# Patient Record
Sex: Male | Born: 1965 | Race: Black or African American | Hispanic: No | Marital: Married | Smoking: Never smoker
Health system: Southern US, Community
[De-identification: ages and names within clinical notes are randomized; demographics above are authoritative.]

## PROBLEM LIST (undated history)

## (undated) DIAGNOSIS — M109 Gout, unspecified: Secondary | ICD-10-CM

## (undated) DIAGNOSIS — G4730 Sleep apnea, unspecified: Secondary | ICD-10-CM

## (undated) DIAGNOSIS — H15101 Unspecified episcleritis, right eye: Secondary | ICD-10-CM

## (undated) DIAGNOSIS — R0902 Hypoxemia: Secondary | ICD-10-CM

## (undated) DIAGNOSIS — K219 Gastro-esophageal reflux disease without esophagitis: Secondary | ICD-10-CM

## (undated) DIAGNOSIS — T7840XA Allergy, unspecified, initial encounter: Secondary | ICD-10-CM

## (undated) DIAGNOSIS — I10 Essential (primary) hypertension: Secondary | ICD-10-CM

## (undated) DIAGNOSIS — D649 Anemia, unspecified: Secondary | ICD-10-CM

## (undated) DIAGNOSIS — M069 Rheumatoid arthritis, unspecified: Secondary | ICD-10-CM

## (undated) DIAGNOSIS — E785 Hyperlipidemia, unspecified: Secondary | ICD-10-CM

## (undated) HISTORY — DX: Allergy, unspecified, initial encounter: T78.40XA

## (undated) HISTORY — DX: Unspecified episcleritis, right eye: H15.101

## (undated) HISTORY — DX: Hypoxemia: R09.02

## (undated) HISTORY — DX: Sleep apnea, unspecified: G47.30

## (undated) HISTORY — DX: Anemia, unspecified: D64.9

## (undated) HISTORY — DX: Gout, unspecified: M10.9

## (undated) HISTORY — DX: Gastro-esophageal reflux disease without esophagitis: K21.9

## (undated) HISTORY — DX: Hyperlipidemia, unspecified: E78.5

---

## 2002-09-21 ENCOUNTER — Ambulatory Visit (HOSPITAL_COMMUNITY): Admission: RE | Admit: 2002-09-21 | Discharge: 2002-09-21 | Payer: Self-pay | Admitting: Specialist

## 2003-07-31 HISTORY — PX: KNEE SURGERY: SHX244

## 2004-06-02 ENCOUNTER — Ambulatory Visit: Payer: Self-pay | Admitting: Family Medicine

## 2004-06-12 ENCOUNTER — Ambulatory Visit: Payer: Self-pay | Admitting: Family Medicine

## 2005-02-15 ENCOUNTER — Ambulatory Visit: Payer: Self-pay | Admitting: Family Medicine

## 2006-01-07 ENCOUNTER — Ambulatory Visit: Payer: Self-pay | Admitting: Family Medicine

## 2006-01-10 ENCOUNTER — Ambulatory Visit: Payer: Self-pay | Admitting: Family Medicine

## 2006-02-04 ENCOUNTER — Ambulatory Visit: Payer: Self-pay | Admitting: Family Medicine

## 2006-03-04 ENCOUNTER — Ambulatory Visit: Payer: Self-pay | Admitting: Family Medicine

## 2006-07-30 DIAGNOSIS — E1165 Type 2 diabetes mellitus with hyperglycemia: Secondary | ICD-10-CM | POA: Insufficient documentation

## 2007-03-07 DIAGNOSIS — J309 Allergic rhinitis, unspecified: Secondary | ICD-10-CM | POA: Insufficient documentation

## 2007-03-07 DIAGNOSIS — F528 Other sexual dysfunction not due to a substance or known physiological condition: Secondary | ICD-10-CM | POA: Insufficient documentation

## 2007-03-07 DIAGNOSIS — I10 Essential (primary) hypertension: Secondary | ICD-10-CM | POA: Insufficient documentation

## 2007-03-10 ENCOUNTER — Ambulatory Visit: Payer: Self-pay | Admitting: Family Medicine

## 2007-03-10 LAB — CONVERTED CEMR LAB
ALT: 33 units/L (ref 0–53)
AST: 27 units/L (ref 0–37)
Albumin: 4 g/dL (ref 3.5–5.2)
Alkaline Phosphatase: 71 units/L (ref 39–117)
BUN: 10 mg/dL (ref 6–23)
Basophils Absolute: 0 10*3/uL (ref 0.0–0.1)
Basophils Relative: 0.7 % (ref 0.0–1.0)
Bilirubin, Direct: 0.1 mg/dL (ref 0.0–0.3)
CO2: 30 meq/L (ref 19–32)
Calcium: 9.3 mg/dL (ref 8.4–10.5)
Chloride: 99 meq/L (ref 96–112)
Cholesterol: 148 mg/dL (ref 0–200)
Creatinine, Ser: 1.2 mg/dL (ref 0.4–1.5)
Eosinophils Absolute: 0.1 10*3/uL (ref 0.0–0.6)
Eosinophils Relative: 1.6 % (ref 0.0–5.0)
GFR calc Af Amer: 85 mL/min
GFR calc non Af Amer: 70 mL/min
Glucose, Bld: 148 mg/dL — ABNORMAL HIGH (ref 70–99)
HCT: 42.2 % (ref 39.0–52.0)
HDL: 27.2 mg/dL — ABNORMAL LOW (ref >39.0)
Hemoglobin: 14.2 g/dL (ref 13.0–17.0)
Hgb A1c MFr Bld: 7.8 % — ABNORMAL HIGH (ref 4.6–6.0)
LDL Cholesterol: 95 mg/dL (ref 0–99)
Lymphocytes Relative: 41.7 % (ref 12.0–46.0)
MCHC: 33.7 g/dL (ref 30.0–36.0)
MCV: 80 fL (ref 78.0–100.0)
Monocytes Absolute: 0.6 10*3/uL (ref 0.2–0.7)
Monocytes Relative: 9.5 % (ref 3.0–11.0)
Neutro Abs: 3.2 10*3/uL (ref 1.4–7.7)
Neutrophils Relative %: 46.5 % (ref 43.0–77.0)
PSA: 0.33 ng/mL (ref 0.10–4.00)
Platelets: 220 10*3/uL (ref 150–400)
Potassium: 3.5 meq/L (ref 3.5–5.1)
RBC: 5.27 M/uL (ref 4.22–5.81)
RDW: 12.4 % (ref 11.5–14.6)
Sodium: 137 meq/L (ref 135–145)
TSH: 3.3 microintl units/mL (ref 0.35–5.50)
Total Bilirubin: 1 mg/dL (ref 0.3–1.2)
Total CHOL/HDL Ratio: 5.4
Total Protein: 7.4 g/dL (ref 6.0–8.3)
Triglycerides: 127 mg/dL (ref 0–149)
VLDL: 25 mg/dL (ref 0–40)
WBC: 6.7 10*3/uL (ref 4.5–10.5)

## 2007-03-13 ENCOUNTER — Ambulatory Visit: Payer: Self-pay | Admitting: Family Medicine

## 2007-03-24 ENCOUNTER — Ambulatory Visit: Payer: Self-pay | Admitting: Family Medicine

## 2007-05-26 ENCOUNTER — Ambulatory Visit: Payer: Self-pay | Admitting: Family Medicine

## 2007-05-26 LAB — CONVERTED CEMR LAB: Hgb A1c MFr Bld: 6.6 % — ABNORMAL HIGH (ref 4.6–6.0)

## 2008-04-15 ENCOUNTER — Ambulatory Visit: Payer: Self-pay | Admitting: Family Medicine

## 2008-04-15 LAB — CONVERTED CEMR LAB
ALT: 31 units/L (ref 0–53)
AST: 24 units/L (ref 0–37)
Albumin: 4.1 g/dL (ref 3.5–5.2)
Alkaline Phosphatase: 63 units/L (ref 39–117)
BUN: 13 mg/dL (ref 6–23)
Basophils Absolute: 0.1 10*3/uL (ref 0.0–0.1)
Basophils Relative: 0.9 % (ref 0.0–3.0)
Bilirubin, Direct: 0.1 mg/dL (ref 0.0–0.3)
CO2: 26 meq/L (ref 19–32)
Calcium: 9.4 mg/dL (ref 8.4–10.5)
Chloride: 101 meq/L (ref 96–112)
Cholesterol: 116 mg/dL (ref 0–200)
Creatinine, Ser: 1.2 mg/dL (ref 0.4–1.5)
Creatinine,U: 226.6 mg/dL
Eosinophils Absolute: 0.1 10*3/uL (ref 0.0–0.7)
Eosinophils Relative: 1 % (ref 0.0–5.0)
GFR calc Af Amer: 85 mL/min
GFR calc non Af Amer: 70 mL/min
Glucose, Bld: 121 mg/dL — ABNORMAL HIGH (ref 70–99)
HCT: 42.3 % (ref 39.0–52.0)
HDL: 25.2 mg/dL — ABNORMAL LOW (ref >39.0)
Hemoglobin: 14.3 g/dL (ref 13.0–17.0)
Hgb A1c MFr Bld: 6.8 % — ABNORMAL HIGH (ref 4.6–6.0)
LDL Cholesterol: 80 mg/dL (ref 0–99)
Lymphocytes Relative: 40.6 % (ref 12.0–46.0)
MCHC: 33.9 g/dL (ref 30.0–36.0)
MCV: 80.9 fL (ref 78.0–100.0)
Microalb Creat Ratio: 1.8 mg/g (ref 0.0–30.0)
Microalb, Ur: 0.4 mg/dL (ref 0.0–1.9)
Monocytes Absolute: 0.6 10*3/uL (ref 0.1–1.0)
Monocytes Relative: 8.7 % (ref 3.0–12.0)
Neutro Abs: 3 10*3/uL (ref 1.4–7.7)
Neutrophils Relative %: 48.8 % (ref 43.0–77.0)
PSA: 0.24 ng/mL (ref 0.10–4.00)
Platelets: 230 10*3/uL (ref 150–400)
Potassium: 3.9 meq/L (ref 3.5–5.1)
RBC: 5.22 M/uL (ref 4.22–5.81)
RDW: 12.4 % (ref 11.5–14.6)
Sodium: 136 meq/L (ref 135–145)
TSH: 3.02 microintl units/mL (ref 0.35–5.50)
Total Bilirubin: 1 mg/dL (ref 0.3–1.2)
Total CHOL/HDL Ratio: 4.6
Total Protein: 7.5 g/dL (ref 6.0–8.3)
Triglycerides: 52 mg/dL (ref 0–149)
VLDL: 10 mg/dL (ref 0–40)
WBC: 6.4 10*3/uL (ref 4.5–10.5)

## 2008-04-22 ENCOUNTER — Ambulatory Visit: Payer: Self-pay | Admitting: Family Medicine

## 2008-08-18 DIAGNOSIS — M25469 Effusion, unspecified knee: Secondary | ICD-10-CM | POA: Insufficient documentation

## 2008-08-23 ENCOUNTER — Ambulatory Visit: Payer: Self-pay | Admitting: Family Medicine

## 2008-10-26 ENCOUNTER — Ambulatory Visit: Payer: Self-pay | Admitting: Family Medicine

## 2008-10-26 LAB — CONVERTED CEMR LAB
BUN: 15 mg/dL (ref 6–23)
CO2: 30 meq/L (ref 19–32)
Calcium: 9.2 mg/dL (ref 8.4–10.5)
Chloride: 107 meq/L (ref 96–112)
Creatinine, Ser: 1.1 mg/dL (ref 0.4–1.5)
GFR calc non Af Amer: 93.21 mL/min (ref >60)
Glucose, Bld: 141 mg/dL — ABNORMAL HIGH (ref 70–99)
Hgb A1c MFr Bld: 7.2 % — ABNORMAL HIGH (ref 4.6–6.5)
Potassium: 4.3 meq/L (ref 3.5–5.1)
Sodium: 142 meq/L (ref 135–145)

## 2008-11-03 ENCOUNTER — Ambulatory Visit: Payer: Self-pay | Admitting: Family Medicine

## 2008-12-28 ENCOUNTER — Encounter: Payer: Self-pay | Admitting: Family Medicine

## 2009-01-24 ENCOUNTER — Ambulatory Visit: Payer: Self-pay | Admitting: Family Medicine

## 2009-01-24 ENCOUNTER — Telehealth: Payer: Self-pay | Admitting: Family Medicine

## 2009-01-24 LAB — CONVERTED CEMR LAB
BUN: 18 mg/dL (ref 6–23)
CO2: 28 meq/L (ref 19–32)
Calcium: 9.1 mg/dL (ref 8.4–10.5)
Chloride: 107 meq/L (ref 96–112)
Creatinine, Ser: 1.3 mg/dL (ref 0.4–1.5)
GFR calc non Af Amer: 76.78 mL/min (ref >60)
Glucose, Bld: 148 mg/dL — ABNORMAL HIGH (ref 70–99)
Hgb A1c MFr Bld: 7.2 % — ABNORMAL HIGH (ref 4.6–6.5)
Potassium: 4.6 meq/L (ref 3.5–5.1)
Sodium: 142 meq/L (ref 135–145)

## 2009-02-07 ENCOUNTER — Ambulatory Visit: Payer: Self-pay | Admitting: Family Medicine

## 2009-04-11 ENCOUNTER — Ambulatory Visit: Payer: Self-pay | Admitting: Family Medicine

## 2009-04-11 LAB — CONVERTED CEMR LAB
ALT: 34 units/L (ref 0–53)
AST: 23 units/L (ref 0–37)
Albumin: 4 g/dL (ref 3.5–5.2)
Alkaline Phosphatase: 66 units/L (ref 39–117)
BUN: 15 mg/dL (ref 6–23)
Basophils Absolute: 0 10*3/uL (ref 0.0–0.1)
Basophils Relative: 0.2 % (ref 0.0–3.0)
Bilirubin Urine: NEGATIVE
Bilirubin, Direct: 0 mg/dL (ref 0.0–0.3)
Blood in Urine, dipstick: NEGATIVE
CO2: 28 meq/L (ref 19–32)
Calcium: 9.4 mg/dL (ref 8.4–10.5)
Chloride: 106 meq/L (ref 96–112)
Cholesterol: 148 mg/dL (ref 0–200)
Creatinine, Ser: 1.2 mg/dL (ref 0.4–1.5)
Creatinine,U: 173.5 mg/dL
Eosinophils Absolute: 0.1 10*3/uL (ref 0.0–0.7)
Eosinophils Relative: 1.4 % (ref 0.0–5.0)
GFR calc non Af Amer: 84.13 mL/min (ref >60)
Glucose, Bld: 114 mg/dL — ABNORMAL HIGH (ref 70–99)
Glucose, Urine, Semiquant: NEGATIVE
HCT: 44.6 % (ref 39.0–52.0)
HDL: 31.6 mg/dL — ABNORMAL LOW (ref >39.00)
Hemoglobin: 14.7 g/dL (ref 13.0–17.0)
Hgb A1c MFr Bld: 6.8 % — ABNORMAL HIGH (ref 4.6–6.5)
Ketones, urine, test strip: NEGATIVE
LDL Cholesterol: 98 mg/dL (ref 0–99)
Lymphocytes Relative: 38.7 % (ref 12.0–46.0)
Lymphs Abs: 2.5 10*3/uL (ref 0.7–4.0)
MCHC: 32.9 g/dL (ref 30.0–36.0)
MCV: 82.6 fL (ref 78.0–100.0)
Microalb Creat Ratio: 1.7 mg/g (ref 0.0–30.0)
Microalb, Ur: 0.3 mg/dL (ref 0.0–1.9)
Monocytes Absolute: 0.6 10*3/uL (ref 0.1–1.0)
Monocytes Relative: 8.9 % (ref 3.0–12.0)
Neutro Abs: 3.2 10*3/uL (ref 1.4–7.7)
Neutrophils Relative %: 50.8 % (ref 43.0–77.0)
Nitrite: NEGATIVE
PSA: 0.26 ng/mL (ref 0.10–4.00)
Platelets: 207 10*3/uL (ref 150.0–400.0)
Potassium: 4.3 meq/L (ref 3.5–5.1)
Protein, U semiquant: NEGATIVE
RBC: 5.4 M/uL (ref 4.22–5.81)
RDW: 12.6 % (ref 11.5–14.6)
Sodium: 139 meq/L (ref 135–145)
Specific Gravity, Urine: 1.025
TSH: 2.06 microintl units/mL (ref 0.35–5.50)
Total Bilirubin: 0.9 mg/dL (ref 0.3–1.2)
Total CHOL/HDL Ratio: 5
Total Protein: 7.6 g/dL (ref 6.0–8.3)
Triglycerides: 90 mg/dL (ref 0.0–149.0)
Urobilinogen, UA: 0.2
VLDL: 18 mg/dL (ref 0.0–40.0)
WBC Urine, dipstick: NEGATIVE
WBC: 6.4 10*3/uL (ref 4.5–10.5)
pH: 5.5

## 2009-04-18 ENCOUNTER — Ambulatory Visit: Payer: Self-pay | Admitting: Family Medicine

## 2009-07-11 ENCOUNTER — Telehealth: Payer: Self-pay | Admitting: Family Medicine

## 2009-07-11 ENCOUNTER — Ambulatory Visit: Payer: Self-pay | Admitting: Family Medicine

## 2009-07-11 LAB — CONVERTED CEMR LAB
BUN: 13 mg/dL (ref 6–23)
Creatinine, Ser: 1.2 mg/dL (ref 0.4–1.5)
GFR calc non Af Amer: 84.04 mL/min (ref >60)
Hgb A1c MFr Bld: 6.9 % — ABNORMAL HIGH (ref 4.6–6.5)

## 2009-07-18 ENCOUNTER — Ambulatory Visit: Payer: Self-pay | Admitting: Family Medicine

## 2009-10-10 ENCOUNTER — Ambulatory Visit: Payer: Self-pay | Admitting: Family Medicine

## 2009-10-10 LAB — CONVERTED CEMR LAB
Calcium: 9.3 mg/dL (ref 8.4–10.5)
Creatinine, Ser: 1.1 mg/dL (ref 0.4–1.5)
GFR calc non Af Amer: 92.81 mL/min (ref >60)
Hgb A1c MFr Bld: 6.6 % — ABNORMAL HIGH (ref 4.6–6.5)
Sodium: 143 meq/L (ref 135–145)

## 2009-10-24 ENCOUNTER — Ambulatory Visit: Payer: Self-pay | Admitting: Family Medicine

## 2009-11-28 ENCOUNTER — Ambulatory Visit: Payer: Self-pay | Admitting: Pulmonary Disease

## 2009-11-28 DIAGNOSIS — G4710 Hypersomnia, unspecified: Secondary | ICD-10-CM | POA: Insufficient documentation

## 2009-11-28 DIAGNOSIS — G4730 Sleep apnea, unspecified: Secondary | ICD-10-CM

## 2009-11-29 ENCOUNTER — Encounter: Payer: Self-pay | Admitting: Pulmonary Disease

## 2009-11-30 ENCOUNTER — Ambulatory Visit: Payer: Self-pay | Admitting: Pulmonary Disease

## 2009-12-07 DIAGNOSIS — G4733 Obstructive sleep apnea (adult) (pediatric): Secondary | ICD-10-CM | POA: Insufficient documentation

## 2009-12-12 ENCOUNTER — Encounter: Payer: Self-pay | Admitting: Pulmonary Disease

## 2009-12-12 ENCOUNTER — Telehealth: Payer: Self-pay | Admitting: Pulmonary Disease

## 2009-12-19 ENCOUNTER — Encounter: Admission: RE | Admit: 2009-12-19 | Discharge: 2009-12-19 | Payer: Self-pay | Admitting: Family Medicine

## 2009-12-20 ENCOUNTER — Ambulatory Visit: Payer: Self-pay | Admitting: Pulmonary Disease

## 2010-01-13 ENCOUNTER — Emergency Department (HOSPITAL_COMMUNITY): Admission: EM | Admit: 2010-01-13 | Discharge: 2010-01-13 | Payer: Self-pay | Admitting: Emergency Medicine

## 2010-01-16 ENCOUNTER — Ambulatory Visit: Payer: Self-pay | Admitting: Family Medicine

## 2010-01-16 LAB — CONVERTED CEMR LAB
Calcium: 9 mg/dL (ref 8.4–10.5)
GFR calc non Af Amer: 75.77 mL/min (ref >60)
Hgb A1c MFr Bld: 6.7 % — ABNORMAL HIGH (ref 4.6–6.5)
Potassium: 4.4 meq/L (ref 3.5–5.1)
Sodium: 141 meq/L (ref 135–145)

## 2010-01-20 ENCOUNTER — Ambulatory Visit: Payer: Self-pay | Admitting: Family Medicine

## 2010-01-26 ENCOUNTER — Encounter: Payer: Self-pay | Admitting: Family Medicine

## 2010-02-24 ENCOUNTER — Ambulatory Visit: Payer: Self-pay | Admitting: Internal Medicine

## 2010-02-24 DIAGNOSIS — M25579 Pain in unspecified ankle and joints of unspecified foot: Secondary | ICD-10-CM | POA: Insufficient documentation

## 2010-04-10 ENCOUNTER — Ambulatory Visit: Payer: Self-pay | Admitting: Family Medicine

## 2010-04-10 LAB — CONVERTED CEMR LAB
ALT: 27 units/L (ref 0–53)
BUN: 17 mg/dL (ref 6–23)
Basophils Relative: 0.4 % (ref 0.0–3.0)
Bilirubin Urine: NEGATIVE
CO2: 26 meq/L (ref 19–32)
Chloride: 103 meq/L (ref 96–112)
Cholesterol: 128 mg/dL (ref 0–200)
Eosinophils Relative: 2.9 % (ref 0.0–5.0)
Glucose, Urine, Semiquant: NEGATIVE
HCT: 41.7 % (ref 39.0–52.0)
LDL Cholesterol: 88 mg/dL (ref 0–99)
Lymphs Abs: 2.4 10*3/uL (ref 0.7–4.0)
MCV: 80 fL (ref 78.0–100.0)
Monocytes Absolute: 0.7 10*3/uL (ref 0.1–1.0)
PSA: 0.27 ng/mL (ref 0.10–4.00)
Platelets: 257 10*3/uL (ref 150.0–400.0)
Potassium: 4.5 meq/L (ref 3.5–5.1)
Specific Gravity, Urine: 1.025
TSH: 2.64 microintl units/mL (ref 0.35–5.50)
Total Protein: 7.1 g/dL (ref 6.0–8.3)
Urobilinogen, UA: 0.2
WBC: 6.4 10*3/uL (ref 4.5–10.5)
pH: 5.5

## 2010-04-17 ENCOUNTER — Ambulatory Visit: Payer: Self-pay | Admitting: Family Medicine

## 2010-07-14 ENCOUNTER — Telehealth: Payer: Self-pay | Admitting: Family Medicine

## 2010-08-17 ENCOUNTER — Ambulatory Visit
Admission: RE | Admit: 2010-08-17 | Discharge: 2010-08-17 | Payer: Self-pay | Source: Home / Self Care | Attending: Family Medicine | Admitting: Family Medicine

## 2010-08-17 ENCOUNTER — Other Ambulatory Visit: Payer: Self-pay | Admitting: Family Medicine

## 2010-08-17 DIAGNOSIS — M109 Gout, unspecified: Secondary | ICD-10-CM | POA: Insufficient documentation

## 2010-08-17 LAB — CBC WITH DIFFERENTIAL/PLATELET
Basophils Absolute: 0 10*3/uL (ref 0.0–0.1)
Basophils Relative: 0.3 % (ref 0.0–3.0)
Eosinophils Absolute: 0.2 10*3/uL (ref 0.0–0.7)
Eosinophils Relative: 2.4 % (ref 0.0–5.0)
HCT: 41.6 % (ref 39.0–52.0)
Hemoglobin: 13.8 g/dL (ref 13.0–17.0)
Lymphocytes Relative: 36.3 % (ref 12.0–46.0)
Lymphs Abs: 2.5 10*3/uL (ref 0.7–4.0)
MCHC: 33.2 g/dL (ref 30.0–36.0)
MCV: 81 fl (ref 78.0–100.0)
Monocytes Absolute: 0.7 10*3/uL (ref 0.1–1.0)
Monocytes Relative: 10.3 % (ref 3.0–12.0)
Neutro Abs: 3.5 10*3/uL (ref 1.4–7.7)
Neutrophils Relative %: 50.7 % (ref 43.0–77.0)
Platelets: 249 10*3/uL (ref 150.0–400.0)
RBC: 5.14 Mil/uL (ref 4.22–5.81)
RDW: 13.9 % (ref 11.5–14.6)
WBC: 6.9 10*3/uL (ref 4.5–10.5)

## 2010-08-17 LAB — URIC ACID: Uric Acid, Serum: 8.4 mg/dL — ABNORMAL HIGH (ref 4.0–7.8)

## 2010-08-18 ENCOUNTER — Telehealth: Payer: Self-pay | Admitting: Family Medicine

## 2010-08-31 NOTE — Assessment & Plan Note (Signed)
Summary: cpx/cjr   Vital Signs:  Patient profile:   45 year old male Height:      71 inches Weight:      288 pounds Temp:     98.2 degrees F oral BP sitting:   118 / 80  (left arm) Cuff size:   large  Vitals Entered By: Kern Reap CMA Duncan Dull) (April 17, 2010 9:45 AM)  CC: cpx Is Patient Diabetic? Yes Did you bring your meter with you today? No Pain Assessment Patient in pain? no        Primary Care Tram Wrenn:  Roderick Pee MD  CC:  cpx.  History of Present Illness: Rowe is a 45 year old male, who comes in today for annual physical evaluation because of underlying hypertension and diabetes in her right dysfunction.  His hypertension is treated with Zestoretic 20 -- 25 daily.  BP 118/80.  His diabetes is treated with Glucophage 1000 mg b.i.d., Glucotrol, 5 mg daily.  Fasting blood sugar are in the 110 to 127 range.  A1c7.1.  Twice a week he develops symptoms of hypoglycemia.  When he checks his blood sugar is around 70.  A lead something that he feels better.  He also uses Flonase nasal spray for allergic rhinitis and Levitra 20 mg p.r.n. for ED.  He also takes an 81-mg baby aspirin.  He had normal eye exam by the ophthalmologist 4 months ago.  He gets routine dental care, tetanus, 2002, seasonal flu shot today.  Allergies: 1)  ! Sulfa 2)  ! Celebrex  Past History:  Past medical, surgical, family and social histories (including risk factors) reviewed, and no changes noted (except as noted below).  Past Medical History: Reviewed history from 03/07/2007 and no changes required. Allergic rhinitis Diabetes mellitus, type II Hypertension ED  Past Surgical History: Reviewed history from 11/28/2009 and no changes required. L knee arthroscopy 2005  Family History: Reviewed history from 11/28/2009 and no changes required. none per pt report.   Social History: Reviewed history from 11/28/2009 and no changes required. 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 no    Any prior history of allergic reactions to egg and/or gelatin? no    Do you have a sensitivity to the preservative Thimersol? no    Do you have a past history of Guillan-Barre Syndrome? no    Do you currently have an acute febrile illness? no    Have you ever had a severe reaction to latex? no    Vaccine information given and explained to patient? yes    Are you currently pregnant? no    Lot Number:[id number]   Exp Date:01/26/2010   Site Given  Right  Deltoid IM   Physical Exam  General:  Well-developed,well-nourished,in no acute distress; alert,appropriate and cooperative throughout examination Head:  Normocephalic and atraumatic without obvious abnormalities. No apparent alopecia or balding. Eyes:  No corneal or conjunctival inflammation noted. EOMI. Perrla. Funduscopic exam benign, without hemorrhages, exudates or papilledema. Vision grossly normal. Ears:  External ear exam shows no significant lesions or deformities.  Otoscopic examination reveals clear canals, tympanic membranes are intact bilaterally without bulging, retraction, inflammation or discharge. Hearing is grossly normal bilaterally. Nose:  External nasal examination shows no deformity or  inflammation. Nasal mucosa are pink and moist without lesions or exudates. Mouth:  Oral mucosa and oropharynx without lesions or exudates.  Teeth in good repair. Neck:  No deformities, masses, or tenderness noted. Chest Wall:  No deformities, masses, tenderness or gynecomastia noted. Breasts:  No masses or gynecomastia noted Lungs:  Normal respiratory effort, chest expands symmetrically. Lungs are clear to auscultation, no crackles or wheezes. Heart:  Normal rate and regular rhythm. S1 and S2 normal  without gallop, murmur, click, rub or other extra sounds. Abdomen:  Bowel sounds positive,abdomen soft and non-tender without masses, organomegaly or hernias noted. Rectal:  No external abnormalities noted. Normal sphincter tone. No rectal masses or tenderness. Genitalia:  Testes bilaterally descended without nodularity, tenderness or masses. No scrotal masses or lesions. No penis lesions or urethral discharge. Prostate:  Prostate gland firm and smooth, no enlargement, nodularity, tenderness, mass, asymmetry or induration. Msk:  No deformity or scoliosis noted of thoracic or lumbar spine.   Pulses:  R and L carotid,radial,femoral,dorsalis pedis and posterior tibial pulses are full and equal bilaterally Extremities:  No clubbing, cyanosis, edema, or deformity noted with normal full range of motion of all joints.   Neurologic:  No cranial nerve deficits noted. Station and gait are normal. Plantar reflexes are down-going bilaterally. DTRs are symmetrical throughout. Sensory, motor and coordinative functions appear intact. Skin:  Intact without suspicious lesions or rashes Cervical Nodes:  No lymphadenopathy noted Axillary Nodes:  No palpable lymphadenopathy Inguinal Nodes:  No significant adenopathy Psych:  Cognition and judgment appear intact. Alert and cooperative with normal attention span and concentration. No apparent delusions, illusions, hallucinations  Diabetes Management Exam:    Foot Exam (with socks and/or shoes not present):       Sensory-Pinprick/Light touch:          Left medial foot (L-4): normal          Left dorsal foot (L-5): normal          Left lateral foot (S-1): normal          Right medial foot (L-4): normal          Right dorsal foot (L-5): normal          Right lateral foot (S-1): normal       Sensory-Monofilament:          Left foot: normal          Right foot: normal       Inspection:          Left foot: normal          Right foot: normal       Nails:           Left foot: normal          Right foot: normal    Eye Exam:       Eye Exam done elsewhere          Date: 01/17/2010          Results: normal          Done by: opth   Impression & Recommendations:  Problem # 1:  ERECTILE DYSFUNCTION (ICD-302.72) Assessment Improved  His updated medication list for this problem includes:    Levitra 20 Mg Tabs (Vardenafil hcl) .Marland Kitchen... Take one tab one hour before intercourse  Orders: Prescription Created Electronically [id number])  Problem # 2:  HYPERTENSION (ICD-401.9) Assessment: Improved  His updated medication list for this problem includes:    Zestoretic 20-25 Mg Tabs (  Lisinopril-hydrochlorothiazide) .Marland Kitchen... Take 1 tablet by mouth every morning  Orders: Prescription Created Electronically [phone]) EKG w/ Interpretation (93000)  Problem # 3:  DIABETES MELLITUS, TYPE II (ICD-250.00) Assessment: Improved  His updated medication list for this problem includes:    Aspirin 81 Mg Tbec (Aspirin) ..... Once daily    Zestoretic 20-25 Mg Tabs (Lisinopril-hydrochlorothiazide) .Marland Kitchen... Take 1 tablet by mouth every morning    Glucophage 1000 Mg Tabs (Metformin hcl) .Marland Kitchen... Take 1 tablet by mouth two times a day    Glucotrol 5 Mg Tabs (Glipizide) .Marland Kitchen... Take 1 tablet by mouth every morning  Orders: Prescription Created Electronically [phone])  Problem # 4:  ALLERGIC RHINITIS (ICD-477.9) Assessment: Unchanged  His updated medication list for this problem includes:    Flonase 50 Mcg/act Susp (Fluticasone propionate) ..... One shot r&l nose qhs  Orders: Prescription Created Electronically [phone])  Problem # 5:  Preventive Health Care (ICD-V70.0) Assessment: Unchanged  Complete Medication List: 1)  Aspirin 81 Mg Tbec (Aspirin) .... Once daily 2)  Zestoretic 20-25 Mg Tabs (Lisinopril-hydrochlorothiazide) .... Take 1 tablet by mouth every morning 3)  Flonase 50 Mcg/act Susp (Fluticasone propionate) .... One shot r&l nose qhs 4)  Glucophage 1000 Mg Tabs  (Metformin hcl) .... Take 1 tablet by mouth two times a day 5)  Accu-chek Aviva Strp (Glucose blood) .... Use once daily for glucose control  dx 250.00 6)  Levitra 20 Mg Tabs (Vardenafil hcl) .... Take one tab one hour before intercourse 7)  Glucotrol 5 Mg Tabs (Glipizide) .... Take 1 tablet by mouth every morning  Other Orders: Admin 1st Vaccine (21308) Flu Vaccine 72yrs + (65784)  Patient Instructions: 1)  Please schedule a follow-up appointment in 6 months. 2)  It is important that you exercise regularly at least 20 minutes 5 times a week. If you develop chest pain, have severe difficulty breathing, or feel very tired , stop exercising immediately and seek medical attention. 3)  Take an Aspirin every day. 4)  Check your blood sugars regularly. If your readings are usually above : or below 70 you should contact our office. 5)  It is important that your Diabetic A1c level is checked every 3 months. 6)  See your eye doctor yearly to check for diabetic eye damage. 7)  Check your feet each night for sore areas, calluses or signs of infection. 8)  Check your Blood Pressure regularly. If it is above: you should make an appointment. 9)  BMP prior to visit, ICD-9: 10)  HbgA1C prior to visit, ICD-9: Prescriptions: GLUCOTROL 5 MG TABS (GLIPIZIDE) Take 1 tablet by mouth every morning  #100 x 3   Entered and Authorized by:   Roderick Pee MD   Signed by:   Roderick Pee MD on 04/17/2010   Method used:   Electron[facility] to        [facility] [phone]* (retail)       [address] [place], [place]  [address]       Ph: [phone] or [phone]       Fax: [phone]   RxID:   [id number] LEVITRA 20 MG TABS (VARDENAFIL HCL) take one tab one hour before intercourse  #6 x 11   Entered and Authorized by:   Roderick Pee MD   Signed by:   Roderick Pee MD on 04/17/2010   Method used:   [facility] to        [facility]  [address] [phone]* (retail)       [address] [place], [place]  [address]       Ph: [phone] or [phone]       Fax: [phone]   RxID:   [id number] ACCU-CHEK AVIVA  STRP (GLUCOSE BLOOD) use once daily for glucose control  dx 250.00  #100 x 3   Entered and Authorized by:   Roderick Pee MD   Signed by:   Roderick Pee MD on 04/17/2010   Method used:   Electronically to        [facility] [phone]* (retail)       [address] [place], [place]  [address]       Ph: [phone] or [phone]       Fax: [phone]   RxID:   [id number] GLUCOPHAGE 1000 MG TABS (METFORMIN HCL) Take 1 tablet by mouth two times a day  #200 x 3   Entered and Authorized by:   Roderick Pee MD   Signed by:   Roderick Pee MD on 04/17/2010   Method used:   Electronically to        [facility] [phone]* (retail)       [address] [place], [place]  [address]       Ph: [phone] or [phone]       Fax: [phone]   RxID:   [id number] FLONASE 50 MCG/ACT SUSP (FLUTICASONE PROPIONATE) one shot R&L nose qhs  #3 x 3   Entered and Authorized by:   Roderick Pee MD   Signed by:   Roderick Pee MD on 04/17/2010   Method used:   Electronically to        [facility] [phone]* (retail)       [address] [place], [place]  [address]       Ph: [phone] or [phone]       Fax: [phone]   RxID:   [id number] ZESTORETIC 20-25 MG  TABS (LISINOPRIL-HYDROCHLOROTHIAZIDE) Take 1 tablet by mouth every morning  #100 Tablet x 3   Entered and Authorized by:   Roderick Pee MD   Signed by:   Roderick Pee MD on 04/17/2010   Method used:   Electronically to        [facility] [phone]* (retail)       [address] [place], [place]  [address]       Ph: [phone] or [phone]       Fax: [phone]   RxID:   [id number]

## 2010-08-31 NOTE — Miscellaneous (Signed)
Summary: eye exam  Clinical Lists Changes  Observations: Added new observation of EYES COMMENT: 12/2010 (01/26/2010 9:28) Added new observation of EYE EXAM BY: dr Weston Anna (12/20/2009 9:28) Added new observation of DMEYEEXMRES: normal (12/20/2009 9:28) Added new observation of DIAB EYE EX: normal (12/20/2009 9:28)      Diabetes Management History:      He says that he is exercising.  Type of exercise includes: walk.  He is doing this 5 times per week.    Diabetes Management Exam:    Eye Exam:       Eye Exam done elsewhere          Date: 12/20/2009          Results: normal          Done by: dr Weston Anna

## 2010-08-31 NOTE — Assessment & Plan Note (Signed)
Summary: F/U ON LABWORK // RS   Vital Signs:  Patient profile:   45 year old male Weight:      286 pounds Temp:     98.2 degrees F oral BP sitting:   110 / 80  (left arm) Cuff size:   regular  Vitals Entered By: Kern Reap CMA Duncan Dull) (January 20, 2010 9:23 AM) CC: follow-up visit  Vision Screening:      Vision Comments: 11/2010   Primary Care Provider:  Roderick Pee MD  CC:  follow-up visit.  History of Present Illness: Clifford Kramer is a 45 year old male, diabetic, type II.  Comes in today for follow-up of diabetes.  His current treatment program in addition to diet and exercise is Glucophage 1000 mg b.i.d., Glucotrol 5 mg q.a.m.  His blood sugars at home average between 80 and 120.  Twice a week around 3 in the afternoon.  He typically will have a little hypoglycemic episode.  He checks his blood sugars is in the mid 60s.  He eats something right away, and he feels fine.  We discussed if that continues and is more persistent to let us know.  Would decrease his medication.  He did see the nutritionist.  Eye exam May 1 normal.  No retinopathy  Diabetes Management History:      He says that he is exercising.  Type of exercise includes: walk.  He is doing this 5 times per week.    Allergies: 1)  ! Sulfa 2)  ! Celebrex  Review of Systems      See HPI  Physical Exam  General:  Well-developed,well-nourished,in no acute distress; alert,appropriate and cooperative throughout examination  Diabetes Management Exam:    Foot Exam (with socks and/or shoes not present):       Sensory-Pinprick/Light touch:          Left medial foot (L-4): normal          Left dorsal foot (L-5): normal          Left lateral foot (S-1): normal          Right medial foot (L-4): normal          Right dorsal foot (L-5): normal          Right lateral foot (S-1): normal       Sensory-Monofilament:          Left foot: normal          Right foot: normal       Inspection:          Left foot: normal        Right foot: normal       Nails:          Left foot: normal          Right foot: normal    Eye Exam:       Eye Exam done elsewhere          Date: 11/27/2009          Results: normal          Done by: ellington   Complete Medication List: 1)  Aspirin 81 Mg Tbec (Aspirin) .... Once daily 2)  Zestoretic 20-25 Mg Tabs (Lisinopril-hydrochlorothiazide) .... Take 1 tablet by mouth every morning 3)  Flonase 50 Mcg/act Susp (Fluticasone propionate) .... One shot r&l nose qhs 4)  Glucophage 1000 Mg Tabs (Metformin hcl) .... Take 1 tablet by mouth two times a day 5)  Accu-chek  Aviva Strp (Glucose blood) .... Use once daily for glucose control  dx 250.00 6)  Levitra 20 Mg Tabs (Vardenafil hcl) .... Take one tab one hour before intercourse 7)  Glucotrol 5 Mg Tabs (Glipizide) .... Take 1 tablet by mouth every morning  Patient Instructions: 1)  BMP prior to visit, ICD-9:........250.00  third week in September 2)  Hepatic Panel prior to visit, ICD-9: 3)  Lipid Panel prior to visit, ICD-9: 4)  TSH prior to visit, ICD-9: 5)  CBC w/ Diff prior to visit, ICD-9: 6)  Urine-dip prior to visit, ICD-9: 7)  PSA prior to visit, ICD-9: 8)  HbgA1C prior to visit, ICD-9: 9)  Urine Microalbumin prior to visit, ICD-9:

## 2010-08-31 NOTE — Progress Notes (Signed)
Summary: rx allopurinol  Phone Note Outgoing Call   Summary of Call: rx for allopurinol Initial call taken by: Kern Reap CMA (AAMA),  August 18, 2010 4:55 PM    New/Updated Medications: ALLOPURINOL 300 MG TABS (ALLOPURINOL) take one tab by mouth once daily Prescriptions: ALLOPURINOL 300 MG TABS (ALLOPURINOL) take one tab by mouth once daily  #100 x 3   Entered by:   Kern Reap CMA (AAMA)   Authorized by:   Roderick Pee MD   Signed by:   Kern Reap CMA (AAMA) on 08/18/2010   Method used:   Electronically to        [facility] [phone]* (retail)       [address] [place], [place]  [address]       Ph: [phone] or [phone]       Fax: [phone]   RxID:   [id number]

## 2010-08-31 NOTE — Assessment & Plan Note (Signed)
Summary: 3 MO ROV/MM--- PT [facility] // RS   Vital Signs:  Patient profile:   45 year old male Height:      71.25 inches Weight:      296 pounds BMI:     41.14 Temp:     98.1 degrees F oral BP sitting:   128 / 78  (left arm) Cuff size:   large  Vitals Entered By: Kern Reap CMA Duncan Dull) (October 24, 2009 10:21 AM)  CC: follow-up visit Is Patient Diabetic? Yes Did you bring your meter with you today? No   CC:  follow-up visit.  History of Present Illness: Clifford Kramer is a 45 year old male, who comes in today for evaluation of 3 issues.  His blood pressure is 120/78 on Zestoretic 20 -- 25 daily.  Blood sugar averaging 130 to 100 home.  A1c down to 6.6%.  He is on Glucophage 1000 mg b.i.d. and Glucotrol 5 mg daily.  Since blood sugar and A1c are decreasing.  Will continue diet and exercise.  Also, will refer him to the [facility] for nutritional counseling.  His wife is concerned that he may have sleep apnea.  She states occasionally she notices he stops breathing at night.  He has no fatigue.  Otherwise, feels well.  Would like a consult to see if the sleep apnea study is indicated  Allergies: 1)  ! Sulfa 2)  ! Celebrex  Past History:  Past medical, surgical, family and social histories (including risk factors) reviewed for relevance to current acute and chronic problems.  Past Medical History: Reviewed history from 03/07/2007 and no changes required. Allergic rhinitis Diabetes mellitus, type II Hypertension ED  Family History: Reviewed history and no changes required.  Social History: Reviewed history from 04/22/2008 and no changes required. Occupation: Married Never Smoked Alcohol use-no Drug use-no Regular exercise-yes  Review of Systems      See HPI  Physical Exam  General:  Well-developed,well-nourished,in no acute distress; alert,appropriate and cooperative throughout examination   Impression & Recommendations:  Problem # 1:  HYPERTENSION (ICD-401.9) Assessment  Improved  His updated medication list for this problem includes:    Zestoretic 20-25 Mg Tabs (Lisinopril-hydrochlorothiazide) .Marland Kitchen... Take 1 tablet by mouth every morning  Problem # 2:  DIABETES MELLITUS, TYPE II (ICD-250.00) Assessment: Improved  His updated medication list for this problem includes:    Aspirin 81 Mg Tbec (Aspirin) ..... Once daily    Zestoretic 20-25 Mg Tabs (Lisinopril-hydrochlorothiazide) .Marland Kitchen... Take 1 tablet by mouth every morning    Glucophage 1000 Mg Tabs (Metformin hcl) .Marland Kitchen... Take 1 tablet by mouth two times a day    Glucotrol 5 Mg Tabs (Glipizide) .Marland Kitchen... Take 1 tablet by mouth every morning  Orders: Diabetic Clinic Referral (Diabetic)  Complete Medication List: 1)  Aspirin 81 Mg Tbec (Aspirin) .... Once daily 2)  Zestoretic 20-25 Mg Tabs (Lisinopril-hydrochlorothiazide) .... Take 1 tablet by mouth every morning 3)  Flonase 50 Mcg/act Susp (Fluticasone propionate) .... One shot r&l nose qhs 4)  Glucophage 1000 Mg Tabs (Metformin hcl) .... Take 1 tablet by mouth two times a day 5)  Accu-chek Aviva Strp (Glucose blood) .... Use once daily for glucose control  dx 250.00 6)  Levitra 20 Mg Tabs (Vardenafil hcl) .... Take one tab one hour before intercourse 7)  Glucotrol 5 Mg Tabs (Glipizide) .... Take 1 tablet by mouth every morning  Other Orders: Pulmonary Referral (Pulmonary)  Patient Instructions: 1)  continue your current treatment program.  We will get u  set  up for a consult at the [facility] for nutritional counseling. 2)  I will also get to set up for a consult with Dr.clance to see if a sleep apnea study is  indicated. 3)  Please schedule a follow-up appointment in 3 months......250.00 4)  BMP prior to visit, ICD-9: 5)  HbgA1C prior to visit, ICD-9:

## 2010-08-31 NOTE — Assessment & Plan Note (Signed)
Summary: painful ankle/dm   Vital Signs:  Patient profile:   45 year old male Weight:      294 pounds Temp:     98.3 degrees F oral BP sitting:   110 / 80  (left arm) Cuff size:   regular  Vitals Entered By: Kern Reap CMA Duncan Dull) (August 17, 2010 10:52 AM) CC: left ankle pain   Primary Care Provider:  Roderick Pee MD  CC:  left ankle pain.  History of Present Illness: Clifford Kramer is a 45 year old male, who comes in today for evaluation of recurrent left ankle pain over the past year.  He, states he's had 3 or 4 episodes of atraumatic pain in his left ankle.  His pain.  Last time was a 7 on a scale of one to 10.  He took 600 mg of Motrin a day for 4 days.  The pain has subsided.  Positive family history for gout  Allergies: 1)  ! Sulfa 2)  ! Celebrex  Past History:  Past medical, surgical, family and social histories (including risk factors) reviewed for relevance to current acute and chronic problems.  Past Medical History: Reviewed history from 03/07/2007 and no changes required. Allergic rhinitis Diabetes mellitus, type II Hypertension ED  Past Surgical History: Reviewed history from 11/28/2009 and no changes required. L knee arthroscopy 2005  Family History: Reviewed history from 11/28/2009 and no changes required. none per pt report.   Social History: Reviewed history from 11/28/2009 and no changes required. 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 ankle appears normal.  No swelling.  Pulses normal.  No point tenderness.  Discomfort with all range of motion Pulses:  R and L carotid,radial,femoral,dorsalis pedis and posterior tibial pulses are full and equal bilaterally Extremities:  No clubbing,  cyanosis, edema, or deformity noted with normal full range of motion of all joints.   Neurologic:  No cranial nerve deficits noted. Station and gait are normal. Plantar reflexes are down-going bilaterally. DTRs are symmetrical throughout. Sensory, motor and coordinative functions appear intact.   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 Once daily 2)  Zestoretic 20-25 Mg Tabs (Lisinopril-hydrochlorothiazide) .... Take 1 tablet by mouth every morning 3)  Flonase 50 Mcg/act Susp (Fluticasone propionate) .... One shot r&l nose qhs 4)  Glucophage 1000 Mg Tabs (Metformin hcl) .... Take 1 tablet by mouth two times a day 5)  Accu-chek Aviva Strp (Glucose blood) .... Use once daily for glucose control  dx 250.00 6)  Levitra 20 Mg Tabs (Vardenafil hcl) .... Take one tab one hour before intercourse 7)  Glucotrol 5 Mg Tabs (Glipizide) .... Take 1 tablet by mouth every morning  Patient Instructions: 1)  take 400 mg of Motrin 3 times a day with food x 1 week, then stop.  The Motrin. 2)  I will call you with the report of your blood work   Orders Added: 1)  Venipuncture [84696] 2)  TLB-CBC Platelet - w/Differential [85025-CBCD] 3)  TLB-Uric Acid, Blood [84550-URIC] 4)  Est. Patient Level III [29528] 5)  Specimen Handling [99000]

## 2010-08-31 NOTE — Assessment & Plan Note (Signed)
Summary: consult for ?osa   Copy to:  Kelle Darting Primary Provider/Referring Provider:  Roderick Pee MD  CC:  Sleep Consult.  History of Present Illness: The pt is a 45y/o male who I have been asked to see for possible osa.  He has been noted to have loud snoring, as well as pauses in his breathing during sleep.  He denies choking arousals.  He works third shift for [employer], and goes to bed during the week at 11am and awakens at 6pm.  He feels that he is rested most am's.  The pt denies significant sleepiness with periods of inactivity during his waking hours, but admits that he will often take a nap while on break at work.  He drives a truck for [employer] and denies significant sleepiness while doing so, but does on rare occasions have some sleepiness on the drive to [place].  He will stop and catch a nap and be fine.  On weekends, he admits that he will get sleepy watching tv or movies.  Of note, his weight is up 25 pounds over the last 2 years.  Medications Prior to Update: 1)  Aspirin 81 Mg  Tbec (Aspirin) .... Once Daily 2)  Zestoretic 20-25 Mg  Tabs (Lisinopril-Hydrochlorothiazide) .... Take 1 Tablet By Mouth Every Morning 3)  Flonase 50 Mcg/act Susp (Fluticasone Propionate) .... One Shot R&l Nose Qhs 4)  Glucophage 1000 Mg Tabs (Metformin Hcl) .... Take 1 Tablet By Mouth Two Times A Day 5)  Accu-Chek Aviva  Strp (Glucose Blood) .... Use Once Daily For Glucose Control  Dx 250.00 6)  Levitra 20 Mg Tabs (Vardenafil Hcl) .... Take One Tab One Hour Before Intercourse 7)  Glucotrol 5 Mg Tabs (Glipizide) .... Take 1 Tablet By Mouth Every Morning  Allergies (verified): 1)  ! Sulfa 2)  ! Celebrex  Past History:  Past Medical History: Reviewed history from 03/07/2007 and no changes required. Allergic rhinitis Diabetes mellitus, type II Hypertension ED  Past Surgical History: L knee arthroscopy 2005  Family History: Reviewed history and no changes required. none per pt report.     Social History: Reviewed history from 04/22/2008 and no changes required. Occupation:  Photographer for [employer] Married has children Never Smoked Alcohol use-no Drug use-no Regular exercise-yes  Review of Systems  The patient denies shortness of breath with activity, shortness of breath at rest, productive cough, non-productive cough, coughing up blood, chest pain, irregular heartbeats, acid heartburn, indigestion, loss of appetite, weight change, abdominal pain, difficulty swallowing, sore throat, tooth/dental problems, headaches, nasal congestion/difficulty breathing through nose, sneezing, itching, ear ache, anxiety, depression, hand/feet swelling, joint stiffness or pain, rash, change in color of mucus, and fever.    Vital Signs:  Patient profile:   45 year old male Height:      71 inches Weight:      299.50 pounds BMI:     41.92 O2 Sat:      98 % on Room air Temp:     98.1 degrees F oral Pulse rate:   92 / minute BP sitting:   104 / 70  (left arm) Cuff size:   large  Vitals Entered By: Arman Filter LPN (Nov 28, 1608 1:58 PM)  O2 Flow:  Room air CC: Sleep Consult Comments Medications reviewed with patient Arman Filter LPN  Nov 29, 9602 2:00 PM    Physical Exam  General:  obese male in nad Eyes:  PERRLA and EOMI.   Nose:  large turbinates, deviated septum to right Mouth:  large tongue, elongation of soft palate, normal uvula Neck:  no jvd, tmg, LN Lungs:  clear to auscultation Heart:  rrr, no mrg Abdomen:  soft and nontender, bs+ Extremities:  no edema, pulses intact distally no cyanosis Neurologic:  alert and oriented, moves all 4.   Impression & Recommendations:  Problem # 1:  SNORING (ICD-786.09) The pt has definite snoring that I suspect is symptomatic for him at times.  It is unclear whether he has significant sleep apnea.  He does have some sleepiness during his waking hours at times, but this may be partially due to his third shift work.  He is obese  and has some upper airway abnormalities.  I have had a long conversation with him about sleep apnea, including its impact to his QOL and CV health.  I think he needs a sleep study, and will set him up for a home study since his history is not completely convincing for osa.  I have also reminded him of his moral responsibility to not drive if he is sleepy.  Other Orders: Consultation Level IV (78295) Misc. Referral (Misc. Ref)  Patient Instructions: 1)  will set up for a study at home to check for sleep apnea.  Will call when results available. 2)  work on weight loss

## 2010-08-31 NOTE — Progress Notes (Signed)
Summary: returning call  Phone Note Call from Patient Call back at Home Phone [phone]   Caller: Patient Call For: [name] Summary of Call: Returning Megan's call. Initial call taken by: Darletta Moll,  Dec 12, 2009 10:13 AM  Follow-up for Phone Call        p[t just needed to set appt to discuss sleep results. pt set for 12-22-09 at 9:45. pt aware.Carron Curie CMA  Dec 12, 2009 10:21 AM

## 2010-08-31 NOTE — Assessment & Plan Note (Signed)
Summary: rov to review sleep study results.   Visit Type:  Follow-up Copy to:  Kelle Darting Primary Provider/Referring Provider:  Roderick Pee MD  CC:  Patient is here to discuss apnea link results. No complaints today.Marland Kitchen  History of Present Illness: the pt comes in today for f/u after his recent sleep study.  He was found to have an AHI of 10/hr, and desat transiently to as low as 81%.  I have reviewed the study in detail with him, and answered all of his questions.  Current Medications (verified): 1)  Aspirin 81 Mg  Tbec (Aspirin) .... Once Daily 2)  Zestoretic 20-25 Mg  Tabs (Lisinopril-Hydrochlorothiazide) .... Take 1 Tablet By Mouth Every Morning 3)  Flonase 50 Mcg/act Susp (Fluticasone Propionate) .... One Shot R&l Nose Qhs 4)  Glucophage 1000 Mg Tabs (Metformin Hcl) .... Take 1 Tablet By Mouth Two Times A Day 5)  Accu-Chek Aviva  Strp (Glucose Blood) .... Use Once Daily For Glucose Control  Dx 250.00 6)  Levitra 20 Mg Tabs (Vardenafil Hcl) .... Take One Tab One Hour Before Intercourse 7)  Glucotrol 5 Mg Tabs (Glipizide) .... Take 1 Tablet By Mouth Every Morning  Allergies (verified): 1)  ! Sulfa 2)  ! Celebrex  Review of Systems  The patient denies shortness of breath with activity, shortness of breath at rest, productive cough, non-productive cough, coughing up blood, chest pain, irregular heartbeats, acid heartburn, indigestion, loss of appetite, weight change, abdominal pain, difficulty swallowing, sore throat, tooth/dental problems, headaches, nasal congestion/difficulty breathing through nose, sneezing, itching, ear ache, anxiety, depression, hand/feet swelling, joint stiffness or pain, rash, change in color of mucus, and fever.    Vital Signs:  Patient profile:   45 year old male Height:      71 inches (180.34 cm) Weight:      293 pounds (133.18 kg) BMI:     41.01 O2 Sat:      95 % on Room air Temp:     98.0 degrees F (36.67 degrees C) oral Pulse rate:   90 /  minute BP sitting:   122 / 70  (left arm) Cuff size:   large  Vitals Entered By: Michel Bickers CMA (Dec 20, 2009 9:56 AM)  O2 Sat at Rest %:  95 O2 Flow:  Room air CC: Patient is here to discuss apnea link results. No complaints today. Comments Medications reviewed. Daytime phone verified. Michel Bickers CMA  Dec 20, 2009 9:57 AM   Physical Exam  General:  obese male in nad   Impression & Recommendations:  Problem # 1:  OBSTRUCTIVE SLEEP APNEA (ICD-327.23)  the pt has mild osa by his sleep study, and therefore this is not a health risk for him.  I have reviewed at length the various treatment options for this, including a trial of weight loss, surgery, dental appliance, and cpap.  I have reviewed the various benefits and inconveniences of each.  After a long discussion, the pt would like to take the next 6mos and work aggressively on weight loss.  Time spent with pt today discussing the above was .  Other Orders: Est. Patient Level IV (47829)  Patient Instructions: 1)  work on weight loss 2)  let me know if you would like to consider surgery, dental appliance, or cpap for more aggressive treatment.

## 2010-08-31 NOTE — Progress Notes (Signed)
Summary: cough  Phone Note Call from Patient   Caller: Patient Call For: Roderick Pee MD Summary of Call: Pt bought Sinex and Theraflu and wants to know if it is ok to take. [phone] Initial call taken by: Lynann Beaver CMA AAMA,  July 14, 2010 10:25 AM  Follow-up for Phone Call        I do not recommend that you take any of those over-the-counter medications.......... day having previous in them that will elevate your blood pressure and make your prostate swell up so you can not urinate!!!!!!!!!!!!!!     for a viral infectiondrink 30 ounces of water a day, vaporizer in your bedroom at night, and if you need cough syrup.......... Hydromet one half to 1 teaspoon at bedtime p.r.n., dispense 4 ounces one refill Follow-up by: Roderick Pee MD,  July 14, 2010 11:36 AM  Additional Follow-up for Phone Call Additional follow up Details #1::        left message on machine rx called in Additional Follow-up by: Kern Reap CMA Duncan Dull),  July 14, 2010 5:02 PM    New/Updated Medications: HYDROMET 5-1.5 MG/5ML SYRP (HYDROCODONE-HOMATROPINE) take half to one teaspoon at bedtime as needed cough

## 2010-08-31 NOTE — Letter (Signed)
Summary: [facility]  [address]   [place], [place] [address]   Phone: [phone]  Fax: [phone]    12/12/2009  Orhan Wearing [address] [place], [place]  [address]  Dear Mr. Vanlue,   We have attempted to contact you by phone several times but have been unsucessful in reaching you.  Please call our office at your earliest convenience so that we may schedule you a follow up appointment with Dr. Shelle Iron to discuss your test results.  Thank you.          Sincerely,   Marcelyn Bruins, M.D.

## 2010-08-31 NOTE — Procedures (Signed)
Summary: ApneaLink  ApneaLink   Imported By: Clifford Kramer 12/15/2009 13:04:50  _____________________________________________________________________  External Attachment:    Type:   Image     Comment:   External Document

## 2010-08-31 NOTE — Assessment & Plan Note (Signed)
Summary: apnea link shows mild osa with AHI 10/hr.   History of Present Illness: the pt's apnea link shows an AHI of 10/hr, with desat to 81%.  Flow evaluation period and sat evaluation period were 6h .  He also had 2766 snoring events.  Allergies: 1)  ! Sulfa 2)  ! Celebrex   Impression & Recommendations:  Problem # 1:  OBSTRUCTIVE SLEEP APNEA (ICD-327.23)  the pt had mild osa, and will need OV to discuss treatment options.  Orders: Sleep Std Airflow/Heartrate and O2 SAT unattended (14782)  Appended Document: apnea link shows mild osa with AHI 10/hr. megan, pt needs ov to discuss sleep study results.  Appended Document: apnea link shows mild osa with AHI 10/hr. [id number]  Appended Document: apnea link shows mild osa with AHI 10/hr. [id number]  Appended Document: apnea link shows mild osa with AHI 10/hr. [id number].  THis is my 3rd attempt to contact pt.  per protocol, will sign off on this message and send pt a letter to call our office to schedule ov with kc to discuss sleep study results.

## 2010-08-31 NOTE — Assessment & Plan Note (Signed)
Summary: acute foot pain/dm   Vital Signs:  Patient profile:   45 year old male Weight:      286 pounds Temp:     98.8 degrees F oral BP sitting:   120 / 80  (right arm) Cuff size:   large  Vitals Entered By: Duard Brady LPN (February 24, 2010 3:32 PM) CC: c/o (R) ankle and foot swelling and pain     fbs 135 Is Patient Diabetic? Yes Did you bring your meter with you today? No   Primary Care Provider:  Roderick Pee MD  CC:  c/o (R) ankle and foot swelling and pain     fbs 135.  History of Present Illness: 45 year old patient has a history of hypertension and type 2 diabetes.  He presents with a two-day history of  atraumatic left ankle pain and swelling.  He has been using ibuprofen and is improved today compared to yesterday.  His hypertension and diabetes have been well controlled.  He has no history or gallops or similar problem in the past.  Denies any leg discomfort or swelling.  No history of DVT  Allergies: 1)  ! Sulfa 2)  ! Celebrex  Review of Systems       The patient complains of peripheral edema.  The patient denies anorexia, fever, weight loss, weight gain, vision loss, decreased hearing, hoarseness, chest pain, syncope, dyspnea on exertion, prolonged cough, headaches, hemoptysis, abdominal pain, melena, hematochezia, severe indigestion/heartburn, hematuria, incontinence, genital sores, muscle weakness, suspicious skin lesions, transient blindness, difficulty walking, depression, unusual weight change, abnormal bleeding, enlarged lymph nodes, angioedema, breast masses, and testicular masses.    Physical Exam  General:  overweight-appearing.   Extremities:  patient has some slight tenderness and swelling involving the distal right ankle.  This spares the distal lower leg and calf.  There is slight tenderness with range of motion.  No calf tenderness   Impression & Recommendations:  Problem # 1:  ANKLE PAIN, LEFT (ICD-719.47) the problem appears be limited to  the left ankle.  Nothing to suggest a DVT.  He has improved after one day of ibuprofen and this will be continued.  Problem # 2:  HYPERTENSION (ICD-401.9)  His updated medication list for this problem includes:    Zestoretic 20-25 Mg Tabs (Lisinopril-hydrochlorothiazide) .Marland Kitchen... Take 1 tablet by mouth every morning  Complete Medication List: 1)  Aspirin 81 Mg Tbec (Aspirin) .... Once daily 2)  Zestoretic 20-25 Mg Tabs (Lisinopril-hydrochlorothiazide) .... Take 1 tablet by mouth every morning 3)  Flonase 50 Mcg/act Susp (Fluticasone propionate) .... One shot r&l nose qhs 4)  Glucophage 1000 Mg Tabs (Metformin hcl) .... Take 1 tablet by mouth two times a day 5)  Accu-chek Aviva Strp (Glucose blood) .... Use once daily for glucose control  dx 250.00 6)  Levitra 20 Mg Tabs (Vardenafil hcl) .... Take one tab one hour before intercourse 7)  Glucotrol 5 Mg Tabs (Glipizide) .... Take 1 tablet by mouth every morning  Patient Instructions: 1)  keep left leg elevated for the weekend 2)  Take 400-600mg  of Ibuprofen (Advil, Motrin) with food every 4-6 hours as needed for relief of pain or comfort of fever. 3)  call if pain and swelling  has not resolved by early next week

## 2010-09-16 ENCOUNTER — Other Ambulatory Visit: Payer: Self-pay | Admitting: Family Medicine

## 2010-09-28 ENCOUNTER — Ambulatory Visit: Payer: Self-pay | Admitting: Family Medicine

## 2010-10-02 ENCOUNTER — Other Ambulatory Visit (INDEPENDENT_AMBULATORY_CARE_PROVIDER_SITE_OTHER): Payer: BC Managed Care – PPO

## 2010-10-02 DIAGNOSIS — I10 Essential (primary) hypertension: Secondary | ICD-10-CM

## 2010-10-02 DIAGNOSIS — E119 Type 2 diabetes mellitus without complications: Secondary | ICD-10-CM

## 2010-10-02 LAB — BASIC METABOLIC PANEL
BUN: 15 mg/dL (ref 6–23)
CO2: 26 mEq/L (ref 19–32)
Chloride: 102 mEq/L (ref 96–112)
Glucose, Bld: 126 mg/dL — ABNORMAL HIGH (ref 70–99)
Potassium: 4.3 mEq/L (ref 3.5–5.1)

## 2010-10-09 ENCOUNTER — Encounter: Payer: Self-pay | Admitting: Family Medicine

## 2010-10-09 ENCOUNTER — Ambulatory Visit (INDEPENDENT_AMBULATORY_CARE_PROVIDER_SITE_OTHER): Payer: BC Managed Care – PPO | Admitting: Family Medicine

## 2010-10-09 DIAGNOSIS — E119 Type 2 diabetes mellitus without complications: Secondary | ICD-10-CM

## 2010-10-09 DIAGNOSIS — F528 Other sexual dysfunction not due to a substance or known physiological condition: Secondary | ICD-10-CM

## 2010-10-09 DIAGNOSIS — E291 Testicular hypofunction: Secondary | ICD-10-CM

## 2010-10-09 NOTE — Patient Instructions (Signed)
Continue current medications.  Begin a walking program 15 minutes daily.  Follow-up in 3 months.  Lab work one week at a time.  I will call you report of your testosterone level

## 2010-10-09 NOTE — Progress Notes (Signed)
  Subjective:    Patient ID: Clifford Kramer, male    DOB: 20-Feb-1966, 45 y.o.   MRN: [id number]  HPI Clifford Kramer is a delightful, 45 year old male, who comes in today for follow-up of diabetes, type II.  He is on metformin 500 mg daily and Glucotrol 5 mg daily fasting blood sugar at home at 110 to 120 and one C. Was 7.1% however, he is not walking daily.  We discussed for his ways to get his blood sugar down.  He likes to begin a walking program.  He also takes Levitra p.r.n. For erectile dysfunction.  However, he is complaining of fatigue, no energy, and decreased libido, and wants to know what his testosterone level is   Review of Systems Negative    Objective:   Physical Exam Well-developed well-nourished, male in no acute distress       Assessment & Plan:  Diabetes type II.... Not at goal........ Begin 15 minute walking program.  Follow-up A1c in 3 months.  Erectile dysfunction.............Clifford Kramer Low sex drive......... Question hypogonadism....... Check testosterone level

## 2010-10-16 NOTE — Progress Notes (Signed)
Addended by: Kern Reap on: 10/16/2010 06:45 PM   Modules accepted: Orders

## 2010-12-15 NOTE — Op Note (Signed)
NAMELAVERNE, Clifford Kramer                           ACCOUNT NO.:  [id number]   MEDICAL RECORD NO.:  [id number]                   PATIENT TYPE:  AMB   LOCATION:  DAY                                  FACILITY:  [facility]   PHYSICIAN:  Jene Every, M.D.                 DATE OF BIRTH:  June 13, 1966   DATE OF PROCEDURE:  09/21/2002  DATE OF DISCHARGE:                                 OPERATIVE REPORT   PREOPERATIVE DIAGNOSIS:  Atraumatic chondromalacia of the left knee.   POSTOPERATIVE DIAGNOSES:  1. Atraumatic chondromalacia of the left knee.  2. 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   SURGEON:  Jene Every, M.D.   BRIEF HISTORY AND INDICATION:  This is a 45 year old with refractory knee  pain secondary to an impact injury sustained at work with persistent  symptoms.  Operative intervention was indicated for debridement, diagnosis.  Risks and benefits discussed included bleeding, infection, damage to  neurovascular structures, no change in symptoms, worsening symptoms, etc.   TECHNIQUE:  The patient in supine position.  After induction of adequate  general anesthesia and 1 g of Kefzol, the left lower extremity was prepped  and draped in the usual sterile fashion.  A lateral parapatellar portal and  a superior medial parapatellar portal was fashioned with a #11 blade,  Ingress cannula atraumatically placed.  Irrigant was utilized to insufflate  the joint.  The superior medial Ingress cannula placed again.  Camera was  then inserted via lateral compartment.  Inspection of the suprapatellar  pouch revealed hypertrophic synovitis in the infrapatella region, grade 2  chondromalacia in the inferior pole of the patella, and the wound was  cartilaginous debrided with normal patellofemoral tracking.  The bed was  unremarkable.  Examination of the medial compartment revealed hypertrophic  synovitis invaginating into the medial compartment.  ACL showed some minor  fraying.   PCL was unremarkable.  Lateral compartment was with no evidence of  obvious lesion, lateral meniscus tear, or chondral defect.  Under direct  visualization, the medial and parapatellar portal was fashioned with a #11  blade after localization with 18 gauge needle, sparing the medial meniscus.  [name] was introduced and utilized to perform a chondroplasty of the  inferior pole of the patella, removing the hypertrophic synovium,  patellofemoral joint, and of the medial compartment.  Following this, a  probe was introduced and utilized to probe and evaluate the medial and  lateral meniscus.  There was no evidence of a tear and was stable to probe-  palpation.  No chondral defects in the tibial plateaus or the femoral  condyles.  The knee copiously lavaged and all compartments reexamined.  No  evidence of loose cartilaginous debris.  All instrumentation was removed.  The portals were closed with 4-0 nylon simple sutures.  The wound was  dressed sterilely.  Marcaine 25% with epinephrine was infiltrated into the  joint prior to that and sterile dressing secured with an Ace bandage.  The  patient was then awoken without difficulty and transported to recovery in  satisfactory condition.   The patient tolerated the procedure well with no complications.                                               Jene Every, M.D.    [name]  D:  09/21/2002  T:  09/21/2002  Job:  [id number]

## 2010-12-29 ENCOUNTER — Other Ambulatory Visit: Payer: Self-pay | Admitting: Family Medicine

## 2011-01-01 ENCOUNTER — Other Ambulatory Visit: Payer: BC Managed Care – PPO

## 2011-01-08 ENCOUNTER — Other Ambulatory Visit (INDEPENDENT_AMBULATORY_CARE_PROVIDER_SITE_OTHER): Payer: BC Managed Care – PPO

## 2011-01-08 ENCOUNTER — Ambulatory Visit: Payer: BC Managed Care – PPO | Admitting: Family Medicine

## 2011-01-08 DIAGNOSIS — E119 Type 2 diabetes mellitus without complications: Secondary | ICD-10-CM

## 2011-01-08 LAB — BASIC METABOLIC PANEL
CO2: 27 mEq/L (ref 19–32)
Chloride: 101 mEq/L (ref 96–112)
Glucose, Bld: 137 mg/dL — ABNORMAL HIGH (ref 70–99)
Potassium: 4.4 mEq/L (ref 3.5–5.1)
Sodium: 135 mEq/L (ref 135–145)

## 2011-01-08 LAB — HEMOGLOBIN A1C: Hgb A1c MFr Bld: 7.3 % — ABNORMAL HIGH (ref 4.6–6.5)

## 2011-01-22 ENCOUNTER — Encounter: Payer: Self-pay | Admitting: Family Medicine

## 2011-01-22 ENCOUNTER — Ambulatory Visit (INDEPENDENT_AMBULATORY_CARE_PROVIDER_SITE_OTHER): Payer: BC Managed Care – PPO | Admitting: Family Medicine

## 2011-01-22 VITALS — BP 120/80 | Temp 98.3°F | Wt 284.0 lb

## 2011-01-22 DIAGNOSIS — F528 Other sexual dysfunction not due to a substance or known physiological condition: Secondary | ICD-10-CM

## 2011-01-22 DIAGNOSIS — E119 Type 2 diabetes mellitus without complications: Secondary | ICD-10-CM

## 2011-01-22 MED ORDER — METFORMIN HCL 1000 MG PO TABS: 1000.0000 mg | ORAL_TABLET | Freq: Every day | ORAL | Status: DC

## 2011-01-22 MED ORDER — GLIPIZIDE 10 MG PO TABS: 10.0000 mg | ORAL_TABLET | Freq: Every day | ORAL | Status: DC

## 2011-01-22 NOTE — Progress Notes (Signed)
  Subjective:    Patient ID: Clifford Kramer, male    DOB: 1966-04-20, 45 y.o.   MRN: [id number]  Clifford Kramer is a 45 year old, married man nonsmoker, who comes in today for follow-up of diabetes, type II.  Currently, on glipizide 5 daily in 1000 mg of metformin daily fasting blood sugar is 137A1c 7.3%.  It was 7.1, and then 6.6, prior to that a year ago.    Review of Systems General and metabolic review of systems otherwise negative    Objective:   Physical Exam    Well-developed well-nourished, male in no acute distress    Assessment & Plan:  Diabetes type 2, not at goal increase glipizide to 10 mg daily follow-up A1c in 3 months

## 2011-01-22 NOTE — Patient Instructions (Signed)
Increase the glipizide to 10 mg daily.  Continue the metformin at 1000 mg daily.  Follow-up A1c and office visit in 3 months

## 2011-01-24 ENCOUNTER — Encounter: Payer: Self-pay | Admitting: Family Medicine

## 2011-03-25 ENCOUNTER — Other Ambulatory Visit: Payer: Self-pay | Admitting: Family Medicine

## 2011-04-16 ENCOUNTER — Other Ambulatory Visit: Payer: BC Managed Care – PPO

## 2011-04-20 ENCOUNTER — Other Ambulatory Visit: Payer: Self-pay | Admitting: Family Medicine

## 2011-04-20 DIAGNOSIS — N529 Male erectile dysfunction, unspecified: Secondary | ICD-10-CM

## 2011-04-20 DIAGNOSIS — I10 Essential (primary) hypertension: Secondary | ICD-10-CM

## 2011-04-23 ENCOUNTER — Ambulatory Visit: Payer: BC Managed Care – PPO

## 2011-04-23 ENCOUNTER — Encounter: Payer: BC Managed Care – PPO | Admitting: Family Medicine

## 2011-04-23 ENCOUNTER — Other Ambulatory Visit: Payer: BC Managed Care – PPO

## 2011-04-23 DIAGNOSIS — F528 Other sexual dysfunction not due to a substance or known physiological condition: Secondary | ICD-10-CM

## 2011-04-23 DIAGNOSIS — E119 Type 2 diabetes mellitus without complications: Secondary | ICD-10-CM

## 2011-04-23 LAB — LIPID PANEL
Cholesterol: 127 mg/dL (ref 0–200)
HDL: 32.6 mg/dL — ABNORMAL LOW (ref >39.00)
Total CHOL/HDL Ratio: 4
VLDL: 14.6 mg/dL (ref 0.0–40.0)

## 2011-04-23 LAB — HEPATIC FUNCTION PANEL
AST: 24 U/L (ref 0–37)
Albumin: 3.9 g/dL (ref 3.5–5.2)
Alkaline Phosphatase: 69 U/L (ref 39–117)
Bilirubin, Direct: 0.2 mg/dL (ref 0.0–0.3)
Total Protein: 7 g/dL (ref 6.0–8.3)

## 2011-04-23 LAB — CBC WITH DIFFERENTIAL/PLATELET
Basophils Absolute: 0 10*3/uL (ref 0.0–0.1)
Basophils Relative: 0.3 % (ref 0.0–3.0)
Hemoglobin: 14.7 g/dL (ref 13.0–17.0)
Lymphocytes Relative: 35.9 % (ref 12.0–46.0)
Monocytes Relative: 8.3 % (ref 3.0–12.0)
Neutro Abs: 3.4 10*3/uL (ref 1.4–7.7)
Neutrophils Relative %: 53.1 % (ref 43.0–77.0)
RBC: 5.49 Mil/uL (ref 4.22–5.81)

## 2011-04-23 LAB — BASIC METABOLIC PANEL
CO2: 29 mEq/L (ref 19–32)
Calcium: 9.1 mg/dL (ref 8.4–10.5)
Creatinine, Ser: 1.3 mg/dL (ref 0.4–1.5)
GFR: 78.82 mL/min (ref >60.00)
Potassium: 4.8 mEq/L (ref 3.5–5.1)
Sodium: 139 mEq/L (ref 135–145)

## 2011-04-23 LAB — URINALYSIS
Bilirubin Urine: NEGATIVE
Ketones, ur: NEGATIVE
Leukocytes, UA: NEGATIVE
Specific Gravity, Urine: 1.025 (ref 1.000–1.030)
Total Protein, Urine: NEGATIVE
Urine Glucose: NEGATIVE
pH: 6 (ref 5.0–8.0)

## 2011-04-23 LAB — MICROALBUMIN / CREATININE URINE RATIO
Creatinine,U: 240.2 mg/dL
Microalb Creat Ratio: 0.2 mg/g (ref 0.0–30.0)

## 2011-04-23 LAB — TSH: TSH: 2.22 u[IU]/mL (ref 0.35–5.50)

## 2011-04-23 LAB — PSA: PSA: 0.25 ng/mL (ref 0.10–4.00)

## 2011-04-23 LAB — HEMOGLOBIN A1C: Hgb A1c MFr Bld: 7.1 % — ABNORMAL HIGH (ref 4.6–6.5)

## 2011-04-28 ENCOUNTER — Other Ambulatory Visit: Payer: Self-pay | Admitting: Family Medicine

## 2011-04-30 ENCOUNTER — Encounter: Payer: Self-pay | Admitting: Family Medicine

## 2011-04-30 ENCOUNTER — Ambulatory Visit (INDEPENDENT_AMBULATORY_CARE_PROVIDER_SITE_OTHER): Payer: BC Managed Care – PPO | Admitting: Family Medicine

## 2011-04-30 VITALS — BP 110/80 | Temp 98.1°F | Ht 72.0 in | Wt 288.0 lb

## 2011-04-30 DIAGNOSIS — E119 Type 2 diabetes mellitus without complications: Secondary | ICD-10-CM

## 2011-04-30 DIAGNOSIS — Z23 Encounter for immunization: Secondary | ICD-10-CM

## 2011-04-30 DIAGNOSIS — J309 Allergic rhinitis, unspecified: Secondary | ICD-10-CM

## 2011-04-30 DIAGNOSIS — G4733 Obstructive sleep apnea (adult) (pediatric): Secondary | ICD-10-CM

## 2011-04-30 DIAGNOSIS — M109 Gout, unspecified: Secondary | ICD-10-CM

## 2011-04-30 DIAGNOSIS — I10 Essential (primary) hypertension: Secondary | ICD-10-CM

## 2011-04-30 DIAGNOSIS — Z0000 Encounter for general adult medical examination without abnormal findings: Secondary | ICD-10-CM

## 2011-04-30 DIAGNOSIS — F528 Other sexual dysfunction not due to a substance or known physiological condition: Secondary | ICD-10-CM

## 2011-04-30 MED ORDER — FLUTICASONE PROPIONATE 50 MCG/ACT NA SUSP: 2.0000 | Freq: Every day | NASAL | Status: DC

## 2011-04-30 MED ORDER — LISINOPRIL-HYDROCHLOROTHIAZIDE 20-25 MG PO TABS: 1.0000 | ORAL_TABLET | Freq: Every day | ORAL | Status: DC

## 2011-04-30 MED ORDER — GLIPIZIDE 10 MG PO TABS: ORAL_TABLET | ORAL | Status: DC

## 2011-04-30 MED ORDER — METFORMIN HCL 1000 MG PO TABS: 1000.0000 mg | ORAL_TABLET | Freq: Two times a day (BID) | ORAL | Status: DC

## 2011-04-30 MED ORDER — VARDENAFIL HCL 20 MG PO TABS: 20.0000 mg | ORAL_TABLET | Freq: Every day | ORAL | Status: DC | PRN

## 2011-04-30 MED ORDER — ALLOPURINOL 300 MG PO TABS: 300.0000 mg | ORAL_TABLET | Freq: Every day | ORAL | Status: DC

## 2011-04-30 NOTE — Patient Instructions (Signed)
Continue your current medications except take 5 mg of glipizide prior to evening meal.  Follow-up in 3 months fasting labs one week prior

## 2011-04-30 NOTE — Progress Notes (Signed)
  Subjective:    Patient ID: Clifford Kramer, male    DOB: 1965-09-27, 45 y.o.   MRN: [id number]  HPI Clifford Kramer is a 45 year old, married male, nonsmoker, who comes in today for annual evaluation of diabetes, hypertension, low testosterone, gout, erectile dysfunction, and allergic rhinitis.  He takes allopurinol 300 mg daily asymptomatic.  He uses a steroid nasal spray for his allergic rhinitis.  He takes glipizide 10 mg in the morning and metformin 1000 mg b.i.d. For his diabetes, type 2, blood sugar 138, fasting A1c7 .1%.  We will increase his oral medications.  Eventually he will need insulin.  He exercises on a treadmill one hour per day.  He sees Dr. Everardo All once yearly for follow-up of erectile dysfunction.  They have him on androgel/Levitra.  He takes Zestoretic 20 -- 25 daily for hypertension.  BP 110/80.  He gets annual eye exams, regular dental care.  The exit nations updated today.  Will be the flu and a tetanus booster   Review of Systems  Constitutional: Negative.   HENT: Negative.   Eyes: Negative.   Respiratory: Negative.   Cardiovascular: Negative.   Gastrointestinal: Negative.   Genitourinary: Negative.   Musculoskeletal: Negative.   Skin: Negative.   Neurological: Negative.   Hematological: Negative.   Psychiatric/Behavioral: Negative.        Objective:   Physical Exam  Constitutional: He is oriented to person, place, and time. He appears well-developed and well-nourished.  HENT:  Head: Normocephalic and atraumatic.  Right Ear: External ear normal.  Left Ear: External ear normal.  Nose: Nose normal.  Mouth/Throat: Oropharynx is clear and moist.  Eyes: Conjunctivae and EOM are normal. Pupils are equal, round, and reactive to light.  Neck: Normal range of motion. Neck supple. No JVD present. No tracheal deviation present. No thyromegaly present.  Cardiovascular: Normal rate, regular rhythm, normal heart sounds and intact distal pulses.  Exam reveals no gallop  and no friction rub.   No murmur heard. Pulmonary/Chest: Effort normal and breath sounds normal. No stridor. No respiratory distress. He has no wheezes. He has no rales. He exhibits no tenderness.  Abdominal: Soft. Bowel sounds are normal. He exhibits no distension and no mass. There is no tenderness. There is no rebound and no guarding.  Genitourinary: Rectum normal, prostate normal and penis normal. Guaiac negative stool. No penile tenderness.  Musculoskeletal: Normal range of motion. He exhibits no edema and no tenderness.  Lymphadenopathy:    He has no cervical adenopathy.  Neurological: He is alert and oriented to person, place, and time. He has normal reflexes. No cranial nerve deficit. He exhibits normal muscle tone.  Skin: Skin is warm and dry. No rash noted. No erythema. No pallor.  Psychiatric: He has a normal mood and affect. His behavior is normal. Judgment and thought content normal.          Assessment & Plan:  Healthy male.  Diabetes type II not a goal .........Marland Kitchen And 5 mg of glipizide, prior to 8 p.m. Meal.  Continue diet and exercise.  Follow-up A1c in 3 months.  Allergic rhinitis.  Continue steroid nasal spray.  Hypertension.  Continue Zestoretic.  History of testosterone deficiency.  Continue AndroGel and Levitra.  History of gout........ Continue allopurinol.  Follow-up in 3 months as outlined seasonal flu shot tetanus booster and cardiogram today.

## 2011-06-25 ENCOUNTER — Other Ambulatory Visit: Payer: Self-pay | Admitting: Family Medicine

## 2011-08-06 ENCOUNTER — Other Ambulatory Visit (INDEPENDENT_AMBULATORY_CARE_PROVIDER_SITE_OTHER): Payer: BC Managed Care – PPO

## 2011-08-06 DIAGNOSIS — I10 Essential (primary) hypertension: Secondary | ICD-10-CM

## 2011-08-06 DIAGNOSIS — N529 Male erectile dysfunction, unspecified: Secondary | ICD-10-CM

## 2011-08-06 DIAGNOSIS — E119 Type 2 diabetes mellitus without complications: Secondary | ICD-10-CM

## 2011-08-06 LAB — BASIC METABOLIC PANEL
CO2: 25 mEq/L (ref 19–32)
GFR: 78.01 mL/min (ref >60.00)
Glucose, Bld: 142 mg/dL — ABNORMAL HIGH (ref 70–99)
Potassium: 4.6 mEq/L (ref 3.5–5.1)
Sodium: 139 mEq/L (ref 135–145)

## 2011-08-06 LAB — LIPID PANEL
Cholesterol: 155 mg/dL (ref 0–200)
LDL Cholesterol: 106 mg/dL — ABNORMAL HIGH (ref 0–99)
VLDL: 15.8 mg/dL (ref 0.0–40.0)

## 2011-08-06 LAB — CBC WITH DIFFERENTIAL/PLATELET
Eosinophils Absolute: 0.1 10*3/uL (ref 0.0–0.7)
HCT: 43 % (ref 39.0–52.0)
Lymphs Abs: 2.2 10*3/uL (ref 0.7–4.0)
MCHC: 33.3 g/dL (ref 30.0–36.0)
MCV: 81.1 fl (ref 78.0–100.0)
Monocytes Absolute: 0.6 10*3/uL (ref 0.1–1.0)
Neutrophils Relative %: 55.5 % (ref 43.0–77.0)
Platelets: 250 10*3/uL (ref 150.0–400.0)

## 2011-08-06 LAB — TSH: TSH: 3.4 u[IU]/mL (ref 0.35–5.50)

## 2011-08-06 LAB — PSA: PSA: 0.28 ng/mL (ref 0.10–4.00)

## 2011-08-07 LAB — HEPATIC FUNCTION PANEL
ALT: 33 U/L (ref 0–53)
Alkaline Phosphatase: 66 U/L (ref 39–117)
Bilirubin, Direct: 0.1 mg/dL (ref 0.0–0.3)
Total Bilirubin: 0.8 mg/dL (ref 0.3–1.2)
Total Protein: 6.9 g/dL (ref 6.0–8.3)

## 2011-08-13 ENCOUNTER — Ambulatory Visit (INDEPENDENT_AMBULATORY_CARE_PROVIDER_SITE_OTHER): Payer: BC Managed Care – PPO | Admitting: Family Medicine

## 2011-08-13 ENCOUNTER — Encounter: Payer: Self-pay | Admitting: Family Medicine

## 2011-08-13 DIAGNOSIS — E119 Type 2 diabetes mellitus without complications: Secondary | ICD-10-CM

## 2011-08-13 DIAGNOSIS — F528 Other sexual dysfunction not due to a substance or known physiological condition: Secondary | ICD-10-CM

## 2011-08-13 MED ORDER — GLIPIZIDE 10 MG PO TABS: 10.0000 mg | ORAL_TABLET | Freq: Two times a day (BID) | ORAL | Status: DC

## 2011-08-13 NOTE — Patient Instructions (Signed)
Increase the glipizide to 10 mg twice daily.  Continue the metformin 1000 mg twice daily.  Fasting blood sugar Monday, Wednesday, Friday.  Continued an exercise program.  Return in 3 months for follow-up .......... Non-fasting labs one week prior

## 2011-08-13 NOTE — Progress Notes (Signed)
  Subjective:    Patient ID: Clifford Kramer, male    DOB: 1966-03-18, 46 y.o.   MRN: [id number]  HPI Clifford Kramer is a  46 year old male, who comes in today for evaluation of two problems.  Problem number one is diabetes, type II... He's currently taking metformin 1000 mg b.i.d. And 10 mg of Glucotrol in the morning and 5 mg prior to his evening meal.  However, blood sugar is going up and A1c is going up.  Fasting blood sugar recently 142, A1c7.4.  He is walking on a regular basis and avoiding carbs.  We discussed increasing the Glucotrol to 10 mg b.i.d. And having insulin if this is not control his blood sugar.  He has been on AndroGel.  The dose of which is 2 p......   Testosterone level 310, however, he feels well.His insurance will no longer cover this product in these required to switch to another product     Review of Systems General and metabolic review of systems otherwise negative    Objective:   Physical Exam Well-developed well-nourished, male in no acute distress      Assessment & Plan:  Diabetes type 2, not at goal increase Glucotrol to 10 mg b.i.d. Continue metformin 1000 mg b.i.d. Continue diet exercise and follow-up.  A1c in 3 months.  If blood sugar will not stabilize on oral medications then at insulin.  Low testosterone.  Plan switch to a another product covered by his formulary

## 2011-08-23 ENCOUNTER — Other Ambulatory Visit: Payer: Self-pay | Admitting: *Deleted

## 2011-08-23 MED ORDER — GLUCOSE BLOOD VI STRP: ORAL_STRIP | Status: DC

## 2011-08-23 NOTE — Telephone Encounter (Signed)
Refill for test strips sent.

## 2011-09-20 ENCOUNTER — Telehealth: Payer: Self-pay | Admitting: Family Medicine

## 2011-09-20 MED ORDER — TESTOSTERONE 10 MG/ACT (2%) TD GEL: 4.0000 | Freq: Every day | TRANSDERMAL | Status: DC

## 2011-09-20 NOTE — Telephone Encounter (Signed)
rx called in

## 2011-09-20 NOTE — Telephone Encounter (Signed)
Pt called req to get script for the replacement for Androgel. Pt said that he was told by Fleet Contras to call when pt needs this med. [facility] [address] in [place].

## 2011-10-05 ENCOUNTER — Encounter: Payer: Self-pay | Admitting: Family Medicine

## 2011-10-15 ENCOUNTER — Other Ambulatory Visit: Payer: Self-pay | Admitting: Family Medicine

## 2011-10-21 ENCOUNTER — Other Ambulatory Visit: Payer: Self-pay | Admitting: Family Medicine

## 2011-10-26 ENCOUNTER — Emergency Department
Admission: EM | Admit: 2011-10-26 | Discharge: 2011-10-26 | Disposition: A | Discharge status: Home / Self Care | Payer: BC Managed Care – PPO | Source: Home / Self Care | Attending: Emergency Medicine | Admitting: Emergency Medicine

## 2011-10-26 ENCOUNTER — Encounter: Payer: Self-pay | Admitting: *Deleted

## 2011-10-26 DIAGNOSIS — R05 Cough: Secondary | ICD-10-CM

## 2011-10-26 DIAGNOSIS — R059 Cough, unspecified: Secondary | ICD-10-CM

## 2011-10-26 DIAGNOSIS — J069 Acute upper respiratory infection, unspecified: Secondary | ICD-10-CM

## 2011-10-26 HISTORY — DX: Essential (primary) hypertension: I10

## 2011-10-26 MED ORDER — CLARITHROMYCIN 500 MG PO TABS: 500.0000 mg | ORAL_TABLET | Freq: Two times a day (BID) | ORAL | Status: AC

## 2011-10-26 MED ORDER — GUAIFENESIN-CODEINE 100-10 MG/5ML PO SYRP: 5.0000 mL | ORAL_SOLUTION | Freq: Four times a day (QID) | ORAL | Status: AC | PRN

## 2011-10-26 NOTE — ED Provider Notes (Signed)
History     CSN: [id number]  Arrival date & time 10/26/11  1628   First MD Initiated Contact with Patient 10/26/11 1631      No chief complaint on file.   (Consider location/radiation/quality/duration/timing/severity/associated sxs/prior treatment) HPI Clifford Kramer is a 46 y.o. male who complains of onset of cold symptoms for 10 days.  The symptoms are constant and mild-moderate in severity. No sore throat + cough No pleuritic pain No wheezing + nasal congestion + post-nasal drainage + sinus pain/pressure + chest congestion No itchy/red eyes No earache No hemoptysis No SOB +/- chills/sweats No fever No nausea No vomiting No abdominal pain No diarrhea No skin rashes No fatigue No myalgias No headache    No past medical history on file.  No past surgical history on file.  No family history on file.  History  Substance Use Topics  . Smoking status: Never Smoker   . Smokeless tobacco: Not on file  . Alcohol Use: Not on file      Review of Systems  All other systems reviewed and are negative.    Allergies  Sulfonamide derivatives  Home Medications   Current Outpatient Rx  Name Route Sig Dispense Refill  . ALLOPURINOL 300 MG PO TABS  TAKE ONE TAB BY MOUTH ONCE DAILY 100 tablet 2  . ASPIRIN 81 MG PO TABS Oral Take 81 mg by mouth daily.      Marland Kitchen FLUTICASONE PROPIONATE 50 MCG/ACT NA SUSP Nasal Place 2 sprays into the nose daily. 16 g 11  . GLIPIZIDE 10 MG PO TABS Oral Take 1 tablet (10 mg total) by mouth 2 (two) times daily before a meal. One tablet prior to breakfast and one half tablet prior to p.m. meal 200 tablet 3  . GLUCOSE BLOOD VI STRP  Use as instructed 100 each 12  . LISINOPRIL-HYDROCHLOROTHIAZIDE 20-25 MG PO TABS Oral Take 1 tablet by mouth daily. 100 tablet 3  . LISINOPRIL-HYDROCHLOROTHIAZIDE 20-25 MG PO TABS  TAKE 1 TABLET EVERY MORNING 100 tablet 1  . METFORMIN HCL 1000 MG PO TABS  TAKE 1 TABLET BY MOUTH TWO TIMES A DAY 200 tablet 1  . TESTOSTERONE  10 MG/ACT (2%) TD GEL Transdermal Place 4 Act onto the skin daily. 60 g 5  . VARDENAFIL HCL 20 MG PO TABS Oral Take 1 tablet (20 mg total) by mouth daily as needed. 10 tablet 6    There were no vitals taken for this visit.  Physical Exam  Nursing note and vitals reviewed. Constitutional: He is oriented to person, place, and time. He appears well-developed and well-nourished.  HENT:  Head: Normocephalic and atraumatic.  Right Ear: Tympanic membrane, external ear and ear canal normal.  Left Ear: Tympanic membrane, external ear and ear canal normal.  Nose: Mucosal edema and rhinorrhea present.  Mouth/Throat: Posterior oropharyngeal erythema present. No oropharyngeal exudate or posterior oropharyngeal edema.  Eyes: No scleral icterus.  Neck: Neck supple.  Cardiovascular: Regular rhythm and normal heart sounds.   Pulmonary/Chest: Effort normal. No respiratory distress. He has no decreased breath sounds. He has wheezes (mild scattered). He has no rhonchi.  Neurological: He is alert and oriented to person, place, and time.  Skin: Skin is warm and dry.  Psychiatric: He has a normal mood and affect. His speech is normal.    ED Course  Procedures (including critical care time)  Labs Reviewed - No data to display No results found.   1. Cough   2. Acute upper respiratory infections of unspecified  site       MDM  1)  Take the prescribed antibiotic as instructed.  If not improving in a few days if worsening symptoms, I would suggest getting a chest x-ray and especially looking at the right lung field 2)  Use nasal saline solution (over the counter) at least 3 times a day. 3)  Use over the counter decongestants like Zyrtec-D every 12 hours as needed to help with congestion.  If you have hypertension, do not take medicines with sudafed.  4)  Can take tylenol every 6 hours or motrin every 8 hours for pain or fever. 5)  Follow up with your primary doctor if no improvement in 5-7 days, sooner  if increasing pain, fever, or new symptoms.     Marlaine Hind, MD 10/26/11 [phone]

## 2011-10-26 NOTE — ED Notes (Signed)
Pt complains of productive yellow/green cough, chest congestion, and sweats. Pt states experiencing sx for 1 1/2 wks.

## 2011-11-12 ENCOUNTER — Other Ambulatory Visit: Payer: BC Managed Care – PPO

## 2011-11-19 ENCOUNTER — Ambulatory Visit: Payer: BC Managed Care – PPO | Admitting: Family Medicine

## 2011-12-10 ENCOUNTER — Other Ambulatory Visit (INDEPENDENT_AMBULATORY_CARE_PROVIDER_SITE_OTHER): Payer: BC Managed Care – PPO

## 2011-12-10 DIAGNOSIS — IMO0001 Reserved for inherently not codable concepts without codable children: Secondary | ICD-10-CM

## 2011-12-10 LAB — BASIC METABOLIC PANEL
BUN: 15 mg/dL (ref 6–23)
CO2: 26 mEq/L (ref 19–32)
Chloride: 99 mEq/L (ref 96–112)
Creatinine, Ser: 1.3 mg/dL (ref 0.4–1.5)
Glucose, Bld: 187 mg/dL — ABNORMAL HIGH (ref 70–99)
Potassium: 4.1 mEq/L (ref 3.5–5.1)

## 2011-12-17 ENCOUNTER — Ambulatory Visit: Payer: BC Managed Care – PPO | Admitting: Family Medicine

## 2011-12-31 ENCOUNTER — Ambulatory Visit (INDEPENDENT_AMBULATORY_CARE_PROVIDER_SITE_OTHER): Payer: BC Managed Care – PPO | Admitting: Family Medicine

## 2011-12-31 ENCOUNTER — Encounter: Payer: Self-pay | Admitting: Family Medicine

## 2011-12-31 VITALS — BP 120/80 | Temp 98.5°F | Wt 286.0 lb

## 2011-12-31 DIAGNOSIS — F528 Other sexual dysfunction not due to a substance or known physiological condition: Secondary | ICD-10-CM

## 2011-12-31 DIAGNOSIS — E119 Type 2 diabetes mellitus without complications: Secondary | ICD-10-CM

## 2011-12-31 MED ORDER — VARDENAFIL HCL 20 MG PO TABS: 20.0000 mg | ORAL_TABLET | Freq: Every day | ORAL | Status: DC | PRN

## 2011-12-31 NOTE — Progress Notes (Signed)
  Subjective:    Patient ID: Clifford Kramer, male    DOB: 1966-06-12, 46 y.o.   MRN: [id number]  HPI Clifford Kramer is a 46 year old married male nonsmoker who comes in today for followup of diabetes type 2  He's on max oral medicine and began an exercise program he's decreased his A1c from 7.44 months ago down to 6.4. [facility]   Review of Systems General and metabolic review of systems otherwise negative    Objective:   Physical Exam Well-developed well-nourished male in no acute distress       Assessment & Plan:

## 2011-12-31 NOTE — Patient Instructions (Signed)
Continue your current medication and exercise program  Followup the third week in September  Nonfasting labs one week prior

## 2012-01-09 ENCOUNTER — Encounter: Payer: Self-pay | Admitting: *Deleted

## 2012-01-09 ENCOUNTER — Emergency Department
Admission: EM | Admit: 2012-01-09 | Discharge: 2012-01-09 | Disposition: A | Discharge status: Home / Self Care | Payer: BC Managed Care – PPO | Source: Home / Self Care | Attending: Family Medicine | Admitting: Family Medicine

## 2012-01-09 DIAGNOSIS — J069 Acute upper respiratory infection, unspecified: Secondary | ICD-10-CM

## 2012-01-09 DIAGNOSIS — J029 Acute pharyngitis, unspecified: Secondary | ICD-10-CM

## 2012-01-09 LAB — POCT RAPID STREP A (OFFICE): Rapid Strep A Screen: NEGATIVE

## 2012-01-09 MED ORDER — BENZONATATE 200 MG PO CAPS: 200.0000 mg | ORAL_CAPSULE | Freq: Every day | ORAL | Status: AC

## 2012-01-09 MED ORDER — AMOXICILLIN 875 MG PO TABS: 875.0000 mg | ORAL_TABLET | Freq: Two times a day (BID) | ORAL | Status: AC

## 2012-01-09 NOTE — Discharge Instructions (Signed)
Take plain Mucinex (guaifenesin) twice daily for cough and congestion.  Increase fluid intake, rest. May use Afrin nasal spray (or generic oxymetazoline) twice daily for about 5 days.  Also recommend using saline nasal spray several times daily and saline nasal irrigation (AYR is a common brand) Stop all antihistamines for now, and other non-prescription cough/cold preparations. May take Ibuprofen 200mg , 4 tabs every 8 hours with food for sore throat, headache, etc. Begin Amoxicillin if not improving about 5 days or if persistent fever develops. Follow-up with family doctor if not improving 7 to 10 days.

## 2012-01-09 NOTE — ED Notes (Signed)
Patient c/o sore throat and painful to swallow x early this AM. No otc meds used.

## 2012-01-09 NOTE — ED Provider Notes (Signed)
History     CSN: [id number]  Arrival date & time 01/09/12  1058   First MD Initiated Contact with Patient 01/09/12 1118      Chief Complaint  Patient presents with  . Sore Throat     HPI Comments: Patient complains of developing a mild sore throat about 1AM today while at work,  followed by progressive nasal congestion.  The sore throat became worse later this morning.  A cough developed next.  Complains of fatigue but no myalgias. There has been no pleuritic pain, shortness of breath, or wheezes.   The history is provided by the patient.    Past Medical History  Diagnosis Date  . Diabetes mellitus   . Hypertension     Past Surgical History  Procedure Date  . Knee surgery     Family History  Problem Relation Age of Onset  . Diabetes Mother     History  Substance Use Topics  . Smoking status: Never Smoker   . Smokeless tobacco: Not on file  . Alcohol Use: No      Review of Systems + sore throat + mild cough No pleuritic pain No wheezing + nasal congestion + post-nasal drainage No sinus pain/pressure No itchy/red eyes ? Earache:  Left ear itches No hemoptysis No SOB No fever/chills No nausea No vomiting No abdominal pain No diarrhea No urinary symptoms No skin rashes + fatigue No myalgias No headache   Allergies  Penicillins and Sulfonamide derivatives  Home Medications   Current Outpatient Rx  Name Route Sig Dispense Refill  . ALLOPURINOL 300 MG PO TABS  TAKE ONE TAB BY MOUTH ONCE DAILY 100 tablet 2  . AMOXICILLIN 875 MG PO TABS Oral Take 1 tablet (875 mg total) by mouth 2 (two) times daily. (Rx void after 01/17/12) 20 tablet 0  . ASPIRIN 81 MG PO TABS Oral Take 81 mg by mouth daily.      Marland Kitchen BENZONATATE 200 MG PO CAPS Oral Take 1 capsule (200 mg total) by mouth at bedtime. Take as needed for cough 12 capsule 0  . FLUTICASONE PROPIONATE 50 MCG/ACT NA SUSP Nasal Place 2 sprays into the nose daily. 16 g 11  . GLIPIZIDE 10 MG PO TABS Oral Take 10  mg by mouth 2 (two) times daily before a meal.    . GLUCOSE BLOOD VI STRP  Use as instructed 100 each 12  . LISINOPRIL-HYDROCHLOROTHIAZIDE 20-25 MG PO TABS Oral Take 1 tablet by mouth daily. 100 tablet 3  . METFORMIN HCL 1000 MG PO TABS  TAKE 1 TABLET BY MOUTH TWO TIMES A DAY 200 tablet 1  . TESTOSTERONE 10 MG/ACT (2%) TD GEL Transdermal Place 4 Act onto the skin daily. 60 g 5  . VARDENAFIL HCL 20 MG PO TABS Oral Take 1 tablet (20 mg total) by mouth daily as needed. 10 tablet 6    BP 143/83  Pulse 87  Temp 98.2 F (36.8 C) (Oral)  Resp 14  Ht 5\' 11"  (1.803 m)  Wt 291 lb (131.997 kg)  BMI 40.59 kg/m2  SpO2 97%  Physical Exam Nursing notes and Vital Signs reviewed. Appearance:  Patient appears healthy, stated age, and in no acute distress.  Patient is obese (BMI 40.7) Eyes:  Pupils are equal, round, and reactive to light and accomodation.  Extraocular movement is intact.  Conjunctivae are not inflamed  Ears:  Canals normal.  Tympanic membranes normal.  Nose:  Mildly congested turbinates.  No sinus tenderness.  Pharynx:  Normal Neck:  Supple.  Slightly tender shotty posterior nodes are palpated bilaterally  Lungs:  Clear to auscultation.  Breath sounds are equal.  Heart:  Regular rate and rhythm without murmurs, rubs, or gallops.  Abdomen:  Nontender without masses or hepatosplenomegaly.  Bowel sounds are present.  No CVA or flank tenderness.  Extremities:  No edema.  No calf tenderness Skin:  No rash present.   ED Course  Procedures  none   Labs Reviewed  POCT RAPID STREP A (OFFICE) negative      1. Acute pharyngitis   2. Acute upper respiratory infections of unspecified site       MDM  There is no evidence of bacterial infection today.   Treat symptomatically for now:  Prescription written for Benzonatate [facility]) to take at bedtime for night-time cough.  Take plain Mucinex (guaifenesin) twice daily for cough and congestion.  Increase fluid intake, rest. May use  Afrin nasal spray (or generic oxymetazoline) twice daily for about 5 days.  Also recommend using saline nasal spray several times daily and saline nasal irrigation (AYR is a common brand) Stop all antihistamines for now, and other non-prescription cough/cold preparations. May take Ibuprofen 200mg , 4 tabs every 8 hours with food for sore throat, headache, etc. Begin Amoxicillin if not improving about 5 days or if persistent fever develops (Given a prescription to hold, with an expiration date.  Patient notes that he has taken amoxicillin without adverse effect)  Follow-up with family doctor if not improving 7 to 10 days.         Lattie Haw, MD 01/09/12 1151

## 2012-03-03 ENCOUNTER — Other Ambulatory Visit: Payer: Self-pay | Admitting: Family Medicine

## 2012-04-03 ENCOUNTER — Other Ambulatory Visit: Payer: Self-pay | Admitting: *Deleted

## 2012-04-03 MED ORDER — TESTOSTERONE 10 MG/ACT (2%) TD GEL: 4.0000 | Freq: Every day | TRANSDERMAL | Status: DC

## 2012-04-07 ENCOUNTER — Other Ambulatory Visit (INDEPENDENT_AMBULATORY_CARE_PROVIDER_SITE_OTHER): Payer: BC Managed Care – PPO

## 2012-04-07 DIAGNOSIS — E119 Type 2 diabetes mellitus without complications: Secondary | ICD-10-CM

## 2012-04-07 LAB — BASIC METABOLIC PANEL
BUN: 15 mg/dL (ref 6–23)
CO2: 26 mEq/L (ref 19–32)
Calcium: 9.3 mg/dL (ref 8.4–10.5)
Chloride: 104 mEq/L (ref 96–112)
Creatinine, Ser: 1.2 mg/dL (ref 0.4–1.5)

## 2012-04-14 ENCOUNTER — Encounter: Payer: Self-pay | Admitting: Family Medicine

## 2012-04-14 ENCOUNTER — Ambulatory Visit (INDEPENDENT_AMBULATORY_CARE_PROVIDER_SITE_OTHER): Payer: BC Managed Care – PPO | Admitting: Family Medicine

## 2012-04-14 VITALS — BP 120/70 | Temp 98.2°F | Wt 289.0 lb

## 2012-04-14 DIAGNOSIS — E119 Type 2 diabetes mellitus without complications: Secondary | ICD-10-CM

## 2012-04-14 DIAGNOSIS — Z23 Encounter for immunization: Secondary | ICD-10-CM

## 2012-04-14 NOTE — Progress Notes (Signed)
  Subjective:    Patient ID: Clifford Kramer, male    DOB: 05-18-1966, 46 y.o.   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 Blood sugar is in the 180 range A1c was 6.4% 4 months ago now 7.5%. He states he's not been as active as he normally is  We discussed various options he elects to work on his diet and exercise continue his current medications   Review of Systems      review of systems negative except for a lesion on the right side of his face it has been red and irritated Objective:   Physical Exam Well-developed well-nourished man no acute distress examination of skin shows a 5 mm x 7 mm lesion on the right side of his face that has a central depression       Assessment & Plan:  Diabetes type 2 not at goal discussed options patient elects to continue current oral medications were Cardizem diet and exercise followup in 3 months  Abnormal lesion on the face return for removal

## 2012-04-14 NOTE — Patient Instructions (Signed)
Continue your medications  Work hard on your diet and exercise program  Followup office visit in 3 months labs one week prior  Return sometime in the next couple weeks for removal of the lesion on your face

## 2012-05-05 ENCOUNTER — Other Ambulatory Visit: Payer: Self-pay | Admitting: Family Medicine

## 2012-05-14 ENCOUNTER — Telehealth: Payer: Self-pay | Admitting: Family Medicine

## 2012-05-14 NOTE — Telephone Encounter (Signed)
Spoke with patient and he has an appointment with urology.

## 2012-05-14 NOTE — Telephone Encounter (Signed)
Refer to urologist for followup and evaluation of possible treatment options

## 2012-05-14 NOTE — Telephone Encounter (Signed)
Caller: Jewell/Patient; Patient Name: Clifford Kramer; PCP: Kelle Darting [facility]); Best Callback Phone Number: [phone]; Reason for call: Rash, onset 1 week after using  Fortesta/testosterone cream. Rash is raised, itchy and in patch like form, on bi-lat Thighs. Afebrile. Pt has stopped using [place].   All emergent symptoms ruled out per Rash protocol, see 4 hrs due to new onset of rash after beginning new prscription. Pt would like to use an alternative script, Androgel is no longer covered under insurance. Please review w/ MD and follow up w/ Pt. May leave information on vmail per Pt.

## 2012-05-24 ENCOUNTER — Other Ambulatory Visit: Payer: Self-pay | Admitting: Family Medicine

## 2012-06-02 ENCOUNTER — Ambulatory Visit: Payer: BC Managed Care – PPO | Admitting: Family Medicine

## 2012-06-30 ENCOUNTER — Other Ambulatory Visit: Payer: BC Managed Care – PPO

## 2012-07-07 ENCOUNTER — Other Ambulatory Visit (INDEPENDENT_AMBULATORY_CARE_PROVIDER_SITE_OTHER): Payer: BC Managed Care – PPO

## 2012-07-07 ENCOUNTER — Ambulatory Visit: Payer: BC Managed Care – PPO | Admitting: Family Medicine

## 2012-07-07 DIAGNOSIS — E119 Type 2 diabetes mellitus without complications: Secondary | ICD-10-CM

## 2012-07-07 LAB — BASIC METABOLIC PANEL
CO2: 29 mEq/L (ref 19–32)
Calcium: 9.4 mg/dL (ref 8.4–10.5)
Creatinine, Ser: 1.5 mg/dL (ref 0.4–1.5)
GFR: 64.12 mL/min (ref >60.00)
Glucose, Bld: 126 mg/dL — ABNORMAL HIGH (ref 70–99)

## 2012-07-14 ENCOUNTER — Encounter: Payer: Self-pay | Admitting: Family Medicine

## 2012-07-14 ENCOUNTER — Ambulatory Visit (INDEPENDENT_AMBULATORY_CARE_PROVIDER_SITE_OTHER): Payer: BC Managed Care – PPO | Admitting: Family Medicine

## 2012-07-14 VITALS — BP 110/70 | Temp 98.1°F | Wt 287.0 lb

## 2012-07-14 DIAGNOSIS — E119 Type 2 diabetes mellitus without complications: Secondary | ICD-10-CM

## 2012-07-14 NOTE — Patient Instructions (Signed)
Return in 3 months for your annual physical examination  Continue your current medications diet and exercise

## 2012-07-14 NOTE — Progress Notes (Signed)
  Subjective:    Patient ID: Clifford Kramer, male    DOB: Aug 04, 1965, 46 y.o.   MRN: [id number]  HPI  Clifford Kramer is a 46 year old male who comes in today for evaluation of diabetes  He's worked hard on his diet and exercise program Clifford Kramer and his blood sugar in the 120 range A1c 6.9%. No hypoglycemia  Review of Systems Review of systems otherwise negative    Objective:   Physical Exam  Well-developed well-nourished male no acute distress vital signs stable BP 110/70      Assessment & Plan:  Diabetes type 2 at goal continue current therapy followup in 3 months with CPX

## 2012-07-30 DIAGNOSIS — E785 Hyperlipidemia, unspecified: Secondary | ICD-10-CM | POA: Insufficient documentation

## 2012-07-30 DIAGNOSIS — D563 Thalassemia minor: Secondary | ICD-10-CM | POA: Insufficient documentation

## 2012-08-07 ENCOUNTER — Other Ambulatory Visit: Payer: Self-pay | Admitting: Family Medicine

## 2012-09-08 ENCOUNTER — Other Ambulatory Visit: Payer: Self-pay | Admitting: Family Medicine

## 2012-10-24 ENCOUNTER — Other Ambulatory Visit: Payer: Self-pay | Admitting: Family Medicine

## 2012-12-30 ENCOUNTER — Other Ambulatory Visit: Payer: Self-pay | Admitting: Family Medicine

## 2013-01-09 ENCOUNTER — Other Ambulatory Visit: Payer: Self-pay | Admitting: Family Medicine

## 2013-01-12 ENCOUNTER — Encounter: Payer: Self-pay | Admitting: *Deleted

## 2013-01-12 ENCOUNTER — Emergency Department
Admission: EM | Admit: 2013-01-12 | Discharge: 2013-01-12 | Disposition: A | Discharge status: Home / Self Care | Payer: BC Managed Care – PPO | Source: Home / Self Care

## 2013-01-12 DIAGNOSIS — R059 Cough, unspecified: Secondary | ICD-10-CM

## 2013-01-12 DIAGNOSIS — R490 Dysphonia: Secondary | ICD-10-CM

## 2013-01-12 DIAGNOSIS — R05 Cough: Secondary | ICD-10-CM

## 2013-01-12 MED ORDER — OMEPRAZOLE 20 MG PO CPDR: 20.0000 mg | DELAYED_RELEASE_CAPSULE | Freq: Every day | ORAL | Status: DC

## 2013-01-12 NOTE — ED Provider Notes (Signed)
History     CSN: [id number]  Arrival date & time 01/12/13  1752   None     Chief Complaint  Patient presents with  . Hoarse  . Cough    HPI  Hoarseness and cough x 1 month  Pt states that he has had persistent sensation of having to clear throat as well as mild cough Nonsmoker  No hx/o asthma.  Baseline hx/o DM and HTN Fairly well controlled per pt.  Minimal indigestion sxs though pt did report eating high fat diet.  Has used tums intermittently for indigestion.  No SOB or wheezing.  Hoarseness does not seem to be worse in the morning     Past Medical History  Diagnosis Date  . Diabetes mellitus   . Hypertension     Past Surgical History  Procedure Laterality Date  . Knee surgery      Family History  Problem Relation Age of Onset  . Diabetes Mother     History  Substance Use Topics  . Smoking status: Never Smoker   . Smokeless tobacco: Not on file  . Alcohol Use: No      Review of Systems  All other systems reviewed and are negative.    Allergies  Penicillins and Sulfonamide derivatives  Home Medications   Current Outpatient Rx  Name  Route  Sig  Dispense  Refill  . allopurinol (ZYLOPRIM) 300 MG tablet      TAKE ONE TAB BY MOUTH ONCE DAILY   100 tablet   2   . aspirin 81 MG tablet   Oral   Take 81 mg by mouth daily.           . fluticasone (FLONASE) 50 MCG/ACT nasal spray      PLACE 2 SPRAYS INTO THE NOSE DAILY.   16 g   3   . glipiZIDE (GLUCOTROL) 10 MG tablet   Oral   Take 10 mg by mouth 2 (two) times daily before a meal.         . glipiZIDE (GLUCOTROL) 10 MG tablet      TAKE 1 TABLET PRIOR TO BREAKFAST THEN TAKE 1/2 TABLET PRIOR TO EVENING MEAL   150 tablet   3   . lisinopril-hydrochlorothiazide (PRINZIDE,ZESTORETIC) 20-25 MG per tablet      TAKE 1 TABLET EVERY MORNING   100 tablet   1   . metFORMIN (GLUCOPHAGE) 1000 MG tablet      TAKE 1 TABLET BY MOUTH TWO TIMES A DAY   180 tablet   1   . Testosterone  (AXIRON) 30 MG/ACT SOLN   Transdermal   Place onto the skin.         . vardenafil (LEVITRA) 20 MG tablet   Oral   Take 1 tablet (20 mg total) by mouth daily as needed.   10 tablet   6     BP 125/87  Pulse 88  Temp(Src) 98.7 F (37.1 C) (Oral)  Resp 16  Ht 5\' 11"  (1.803 m)  Wt 288 lb 8 oz (130.863 kg)  BMI 40.26 kg/m2  SpO2 97%  Physical Exam  Constitutional: He appears well-developed and well-nourished.  HENT:  Head: Normocephalic and atraumatic.  Right Ear: External ear normal.  Left Ear: External ear normal.  +nasal erythema, rhinorrhea bilaterally, + post oropharyngeal erythema    Eyes: Conjunctivae are normal. Pupils are equal, round, and reactive to light.  Neck: Normal range of motion. Neck supple.  Cardiovascular: Normal rate and regular rhythm.  Pulmonary/Chest: Effort normal.  Abdominal: Soft.  Musculoskeletal: Normal range of motion.  Lymphadenopathy:    He has no cervical adenopathy.  Neurological: He is alert.  Skin: Skin is warm.    ED Course  Procedures (including critical care time)  Labs Reviewed - No data to display No results found.   1. Hoarseness   2. Cough       MDM  Ddx includes reflux and post nasal drip as these are the most common causes of sxs.  Restart nasacort and claritin for allergic sxs.  Start on prilosec for reflux.  GERD handout given.  Follow up with PCP in 2-4 weeks for follow up.  General red flags reviewed.      The patient and/or caregiver has been counseled thoroughly with regard to treatment plan and/or medications prescribed including dosage, schedule, interactions, rationale for use, and possible side effects and they verbalize understanding. Diagnoses and expected course of recovery discussed and will return if not improved as expected or if the condition worsens. Patient and/or caregiver verbalized understanding.             Doree Albee, MD 01/12/13 1843

## 2013-01-12 NOTE — ED Notes (Signed)
Patient c/o hoarseness x 1.5 months and dry cough. Has tried Nasacort and Claritin at home with no relief.

## 2013-01-14 ENCOUNTER — Other Ambulatory Visit: Payer: Self-pay | Admitting: Family Medicine

## 2013-01-14 ENCOUNTER — Telehealth: Payer: Self-pay | Admitting: Emergency Medicine

## 2013-02-16 ENCOUNTER — Other Ambulatory Visit (INDEPENDENT_AMBULATORY_CARE_PROVIDER_SITE_OTHER): Payer: BC Managed Care – PPO

## 2013-02-16 DIAGNOSIS — E1165 Type 2 diabetes mellitus with hyperglycemia: Secondary | ICD-10-CM

## 2013-02-16 DIAGNOSIS — Z0000 Encounter for general adult medical examination without abnormal findings: Secondary | ICD-10-CM

## 2013-02-16 LAB — MICROALBUMIN / CREATININE URINE RATIO: Microalb Creat Ratio: 0.4 mg/g (ref 0.0–30.0)

## 2013-02-16 LAB — LIPID PANEL
Cholesterol: 161 mg/dL (ref 0–200)
HDL: 30.6 mg/dL — ABNORMAL LOW (ref >39.00)
LDL Cholesterol: 109 mg/dL — ABNORMAL HIGH (ref 0–99)
Triglycerides: 105 mg/dL (ref 0.0–149.0)
VLDL: 21 mg/dL (ref 0.0–40.0)

## 2013-02-16 LAB — POCT URINALYSIS DIPSTICK
Blood, UA: NEGATIVE
Glucose, UA: NEGATIVE
Spec Grav, UA: 1.02
Urobilinogen, UA: 4
pH, UA: 7

## 2013-02-16 LAB — CBC WITH DIFFERENTIAL/PLATELET
Basophils Absolute: 0 10*3/uL (ref 0.0–0.1)
Eosinophils Absolute: 0.1 10*3/uL (ref 0.0–0.7)
Lymphocytes Relative: 38.1 % (ref 12.0–46.0)
MCHC: 33 g/dL (ref 30.0–36.0)
Monocytes Relative: 9.2 % (ref 3.0–12.0)
Neutrophils Relative %: 50.6 % (ref 43.0–77.0)
Platelets: 242 10*3/uL (ref 150.0–400.0)
RDW: 15.2 % — ABNORMAL HIGH (ref 11.5–14.6)

## 2013-02-16 LAB — BASIC METABOLIC PANEL
Chloride: 101 mEq/L (ref 96–112)
Creatinine, Ser: 1.1 mg/dL (ref 0.4–1.5)
Potassium: 4.5 mEq/L (ref 3.5–5.1)
Sodium: 137 mEq/L (ref 135–145)

## 2013-02-16 LAB — HEPATIC FUNCTION PANEL
AST: 19 U/L (ref 0–37)
Albumin: 3.9 g/dL (ref 3.5–5.2)
Alkaline Phosphatase: 61 U/L (ref 39–117)

## 2013-02-16 LAB — TSH: TSH: 2.65 u[IU]/mL (ref 0.35–5.50)

## 2013-02-18 NOTE — Addendum Note (Signed)
Addended by: Kern Reap B on: 02/18/2013 11:05 AM   Modules accepted: Orders

## 2013-02-19 ENCOUNTER — Encounter: Payer: BC Managed Care – PPO | Admitting: Family Medicine

## 2013-02-26 ENCOUNTER — Ambulatory Visit (INDEPENDENT_AMBULATORY_CARE_PROVIDER_SITE_OTHER): Payer: BC Managed Care – PPO | Admitting: Family

## 2013-02-26 ENCOUNTER — Encounter: Payer: Self-pay | Admitting: Family

## 2013-02-26 ENCOUNTER — Other Ambulatory Visit: Payer: Self-pay | Admitting: Family

## 2013-02-26 VITALS — BP 126/82 | HR 68 | Ht 70.75 in | Wt 288.0 lb

## 2013-02-26 DIAGNOSIS — IMO0001 Reserved for inherently not codable concepts without codable children: Secondary | ICD-10-CM

## 2013-02-26 DIAGNOSIS — R490 Dysphonia: Secondary | ICD-10-CM

## 2013-02-26 DIAGNOSIS — Z0000 Encounter for general adult medical examination without abnormal findings: Secondary | ICD-10-CM

## 2013-02-26 DIAGNOSIS — E1165 Type 2 diabetes mellitus with hyperglycemia: Secondary | ICD-10-CM

## 2013-02-26 NOTE — Patient Instructions (Signed)
Hoarseness Hoarseness is produced from a variety of causes. It is important to find the cause so it can be treated. In the absence of a cold or upper respiratory illness, any hoarseness lasting more than 2 weeks should be looked at by a specialist. This is especially important if you have a history of smoking or alcohol use. It is also important to keep in mind that as you grow older, your voice will naturally get weaker, making it easier for you to become hoarse from straining your vocal cords.  CAUSES  Any illness that affects your vocal cords can result in a hoarse voice. Examples of conditions that can affect the vocal cords are listed as follows:   Allergies.  Colds.  Sinusitis.  Gastroesophageal reflux disease.  Croup.  Injury.  Nodules.  Exposure to smoke or toxic fumes or gases.  Congenital and genetic defects.  Paralysis of the vocal cords.  Infections.  Advanced age. DIAGNOSIS  In order to diagnose the cause of your hoarseness, your caregiver will examine your throat using an instrument that uses a tube with a small lighted camera (laryngoscope). It allows your caregiver to look into the mouth and down the throat. TREATMENT  For most cases, treatment will focus on the specific cause of the hoarseness. Depending on the cause, hoarseness can be a temporary condition (acute) or it can be long lasting (chronic). Most cases of hoarseness clear up without complications. Your caregiver will explain to you if this is not likely to happen. SEEK IMMEDIATE MEDICAL CARE IF:   You have increasing hoarseness or loss of voice.  You have shortness of breath.  You are coughing up blood.  There is pain in your neck or throat. Document Released: 06/29/2005 Document Revised: 10/08/2011 Document Reviewed: 09/21/2010 [employer] Patient Information 2014 [employer].  

## 2013-02-26 NOTE — Progress Notes (Signed)
Subjective:    Patient ID: Clifford Kramer, male    DOB: Dec 21, 1965, 47 y.o.   MRN: [id number]  HPI  Patient presents for yearly preventative medicine examination. All immunizations and health maintenance protocols were reviewed with the patient and they are up to date with these protocols. Screening laboratory values were reviewed with the patient including screening of hyperlipidemia PSA renal function and hepatic function. There medications past medical history social history problem list and allergies were reviewed in detail. Goals were established with regard to weight loss exercise diet in compliance with medications  Has concerns of hoarseness ongoing x4-5 months. He has been treating it is GERD that has not been effective. Has also been taking Claritin with no relief.  Review of Systems  Constitutional: Negative.   HENT: Negative.   Eyes: Negative.   Respiratory: Negative.   Cardiovascular: Negative.   Gastrointestinal: Negative.   Endocrine: Negative.   Genitourinary: Negative.   Musculoskeletal: Negative.   Skin: Negative.   Allergic/Immunologic: Negative.   Neurological: Negative.   Hematological: Negative.   Psychiatric/Behavioral: Negative.    Past Medical History  Diagnosis Date  . Diabetes mellitus   . Hypertension     History   Social History  . Marital Status: Married    Spouse Name: N/A    Number of Children: N/A  . Years of Education: N/A   Occupational History  . Not on file.   Social History Main Topics  . Smoking status: Never Smoker   . Smokeless tobacco: Not on file  . Alcohol Use: No  . Drug Use: Not on file  . Sexually Active: Not on file   Other Topics Concern  . Not on file   Social History Narrative  . No narrative on file    Past Surgical History  Procedure Laterality Date  . Knee surgery      Family History  Problem Relation Age of Onset  . Diabetes Mother     Allergies  Allergen Reactions  . Penicillins   .  Sulfonamide Derivatives     REACTION: itching    Current Outpatient Prescriptions on File Prior to Visit  Medication Sig Dispense Refill  . ACCU-CHEK AVIVA PLUS test strip USE AS INSTRUCTED  100 each  3  . allopurinol (ZYLOPRIM) 300 MG tablet TAKE ONE TAB BY MOUTH ONCE DAILY  100 tablet  2  . aspirin 81 MG tablet Take 81 mg by mouth daily.        . fluticasone (FLONASE) 50 MCG/ACT nasal spray PLACE 2 SPRAYS INTO THE NOSE DAILY.  16 g  3  . glipiZIDE (GLUCOTROL) 10 MG tablet Take 10 mg by mouth 2 (two) times daily before a meal.      . glipiZIDE (GLUCOTROL) 10 MG tablet TAKE 1 TABLET PRIOR TO BREAKFAST THEN TAKE 1/2 TABLET PRIOR TO EVENING MEAL  150 tablet  3  . lisinopril-hydrochlorothiazide (PRINZIDE,ZESTORETIC) 20-25 MG per tablet TAKE 1 TABLET EVERY MORNING  100 tablet  1  . metFORMIN (GLUCOPHAGE) 1000 MG tablet TAKE 1 TABLET BY MOUTH TWO TIMES A DAY  180 tablet  1  . omeprazole (PRILOSEC) 20 MG capsule Take 1 capsule (20 mg total) by mouth daily.  30 capsule  3  . Testosterone (AXIRON) 30 MG/ACT SOLN Place onto the skin.      . vardenafil (LEVITRA) 20 MG tablet Take 1 tablet (20 mg total) by mouth daily as needed.  10 tablet  6   No current facility-administered medications on  file prior to visit.    BP 126/82  Pulse 68  Ht 5' 10.75" (1.797 m)  Wt 288 lb (130.636 kg)  BMI 40.45 kg/m2  SpO2 98%chart    Objective:   Physical Exam  Constitutional: He is oriented to person, place, and time. He appears well-developed and well-nourished.  HENT:  Head: Normocephalic.  Right Ear: External ear normal.  Left Ear: External ear normal.  Nose: Nose normal.  Mouth/Throat: Oropharynx is clear and moist.  Eyes: Conjunctivae and EOM are normal. Pupils are equal, round, and reactive to light.  Neck: Normal range of motion. Neck supple. No thyromegaly present.  Cardiovascular: Normal rate, regular rhythm and normal heart sounds.   Pulmonary/Chest: Effort normal and breath sounds normal.   Abdominal: Soft. Bowel sounds are normal. He exhibits no distension. There is no tenderness. There is no rebound.  Genitourinary: Rectum normal, prostate normal and penis normal. Guaiac negative stool. No penile tenderness.  Musculoskeletal: Normal range of motion.  Neurological: He is alert and oriented to person, place, and time. He has normal reflexes.  Skin: Skin is warm and dry.          Assessment & Plan:  Assessment: 1. Complete physical exam 2. Hoarseness  Plan: Encouraged healthy diet, exercise, weight reduction. Will refer to ear nose and throat for management of his hoarseness. Follow up with endocrinology  schedule for better glucose control.

## 2013-03-04 ENCOUNTER — Encounter: Payer: Self-pay | Admitting: Endocrinology

## 2013-03-04 ENCOUNTER — Ambulatory Visit (INDEPENDENT_AMBULATORY_CARE_PROVIDER_SITE_OTHER): Payer: BC Managed Care – PPO | Admitting: Endocrinology

## 2013-03-04 VITALS — BP 130/84 | HR 78 | Temp 98.3°F | Resp 12 | Ht 71.0 in | Wt 283.3 lb

## 2013-03-04 DIAGNOSIS — E785 Hyperlipidemia, unspecified: Secondary | ICD-10-CM

## 2013-03-04 DIAGNOSIS — N529 Male erectile dysfunction, unspecified: Secondary | ICD-10-CM

## 2013-03-04 DIAGNOSIS — E1165 Type 2 diabetes mellitus with hyperglycemia: Secondary | ICD-10-CM | POA: Insufficient documentation

## 2013-03-04 DIAGNOSIS — IMO0001 Reserved for inherently not codable concepts without codable children: Secondary | ICD-10-CM

## 2013-03-04 DIAGNOSIS — IMO0002 Reserved for concepts with insufficient information to code with codable children: Secondary | ICD-10-CM | POA: Insufficient documentation

## 2013-03-04 MED ORDER — LIRAGLUTIDE 18 MG/3ML ~~LOC~~ SOPN: 1.2000 mg | PEN_INJECTOR | Freq: Every day | SUBCUTANEOUS | Status: DC

## 2013-03-04 MED ORDER — GLIPIZIDE ER 5 MG PO TB24: 5.0000 mg | ORAL_TABLET | Freq: Every day | ORAL | Status: DC

## 2013-03-04 NOTE — Progress Notes (Signed)
Patient ID: Clifford Kramer, male   DOB: [birth year], 47 y.o.   MRN: [id number]   Reason for Appointment : Consultation for Type 2 Diabetes  History of Present Illness          Diagnosis: Type 2 diabetes mellitus, date of diagnosis: 2005        He was probably started on metformin at the time of diagnosis when his blood sugars were not very high About 5-6 years ago because of higher blood sugars he was also given glipizide His metformin dose has been increased to the maximum 2000 mg a day. He thinks that for some reason his blood sugars are higher for the last 2 months when he started working the day shift However because of progressively higher readings he is now referred here for further evaluation       Monitors blood glucose: Twice a day.         Glucometer: Accucheck      Blood Glucose readings from blood sugar recall: readings before breakfast: 126 recently, was 110; hs 150-190        Hypoglycemia frequency: Never.           LIFESTYLE: Meals: 3 meals per day.  eating out about 4 times a week, some fast food His breakfast maybe the cereal or eggs and toast and meat Usually eating fruit and sandwich at lunch, usually a grilled meat at suppertime but sometimes fried, will snack on fruit, potato chips or protein bars Physical activity: exercise: 4/7 days a week, at gym, 90 min          Dietician visit: Most recent: 3-4 years ago           Oral hypoglycemic drugs the patient is taking are: Metformin and glipizide        Side effects from medications have been: None Compliance with the medical regimen: Fair, he can do better on diet. Also sometimes will forget to take the glipizide before eating  Lab Results  Component Value Date   HGBA1C 8.1* 02/16/2013   Wt Readings from Last 3 Encounters:  03/04/13 283 lb 4.8 oz (128.504 kg)  02/26/13 288 lb (130.636 kg)  01/12/13 288 lb 8 oz (130.863 kg)   Retinal exam: Most recent: 1/14      Medication List       This list is accurate as  of: 03/04/13  8:38 AM.  Always use your most recent med list.               ACCU-CHEK AVIVA PLUS test strip  Generic drug:  glucose blood  USE AS INSTRUCTED     allopurinol 300 MG tablet  Commonly known as:  ZYLOPRIM  TAKE ONE TAB BY MOUTH ONCE DAILY     aspirin 81 MG tablet  Take 81 mg by mouth daily.     AXIRON 30 MG/ACT Soln  Generic drug:  Testosterone  Place onto the skin.     fluticasone 50 MCG/ACT nasal spray  Commonly known as:  FLONASE  PLACE 2 SPRAYS INTO THE NOSE DAILY.     glipiZIDE 10 MG tablet  Commonly known as:  GLUCOTROL  Take 10 mg by mouth 2 (two) times daily before a meal.     lisinopril-hydrochlorothiazide 20-25 MG per tablet  Commonly known as:  PRINZIDE,ZESTORETIC  TAKE 1 TABLET EVERY MORNING     metFORMIN 1000 MG tablet  Commonly known as:  GLUCOPHAGE  TAKE 1 TABLET BY MOUTH TWO TIMES A DAY  omeprazole 20 MG capsule  Commonly known as:  PRILOSEC  Take 1 capsule (20 mg total) by mouth daily.     vardenafil 20 MG tablet  Commonly known as:  LEVITRA  Take 1 tablet (20 mg total) by mouth daily as needed.        Allergies:  Allergies  Allergen Reactions  . Penicillins   . Sulfonamide Derivatives     REACTION: itching    Past Medical History  Diagnosis Date  . Diabetes mellitus   . Hypertension     Past Surgical History  Procedure Laterality Date  . Knee surgery      Family History  Problem Relation Age of Onset  . Diabetes Mother     Social History:  reports that he has never smoked. He does not have any smokeless tobacco history on file. He reports that he does not drink alcohol. His drug history is not on file.    Review of Systems      Hyperlipidemia: Most recent LDL is 109, has not been told to take any medications for lipids      No unusual headaches.                Skin: No rash or infections     Thyroid:  No cold intolerance, unusual fatigue.     The blood pressure has been controlled with lisinopril HCT       No swelling of feet.     No shortness of breath on exertion.     Bowel habits:  No change.                           No frequency of urination,       No joint  pains.     He has had a history of erectile dysfunction and uses Levitra as needed     He was put on testosterone supplements about 3 years ago when his level was mildly low around 300  but no further evaluation done. He does not think he feels subjectively better with taking the supplements.     No  testosterone levels available since 1/13    No visits with results within 1 Week(s) from this visit. Latest known visit with results is:  Lab on 02/16/2013  Component Date Value Range Status  . WBC 02/16/2013 6.1  4.5 - 10.5 K/uL Final  . RBC 02/16/2013 6.07* 4.22 - 5.81 Mil/uL Final  . Hemoglobin 02/16/2013 15.9  13.0 - 17.0 g/dL Final  . HCT 16/04/9603 48.1  39.0 - 52.0 % Final  . MCV 02/16/2013 79.2  78.0 - 100.0 fl Final  . MCHC 02/16/2013 33.0  30.0 - 36.0 g/dL Final  . RDW 54/03/8118 15.2* 11.5 - 14.6 % Final  . Platelets 02/16/2013 242.0  150.0 - 400.0 K/uL Final  . Neutrophils Relative % 02/16/2013 50.6  43.0 - 77.0 % Final  . Lymphocytes Relative 02/16/2013 38.1  12.0 - 46.0 % Final  . Monocytes Relative 02/16/2013 9.2  3.0 - 12.0 % Final  . Eosinophils Relative 02/16/2013 1.6  0.0 - 5.0 % Final  . Basophils Relative 02/16/2013 0.5  0.0 - 3.0 % Final  . Neutro Abs 02/16/2013 3.1  1.4 - 7.7 K/uL Final  . Lymphs Abs 02/16/2013 2.3  0.7 - 4.0 K/uL Final  . Monocytes Absolute 02/16/2013 0.6  0.1 - 1.0 K/uL Final  . Eosinophils Absolute 02/16/2013 0.1  0.0 - 0.7 K/uL Final  .  Basophils Absolute 02/16/2013 0.0  0.0 - 0.1 K/uL Final  . Sodium 02/16/2013 137  135 - 145 mEq/L Final  . Potassium 02/16/2013 4.5  3.5 - 5.1 mEq/L Final  . Chloride 02/16/2013 101  96 - 112 mEq/L Final  . CO2 02/16/2013 28  19 - 32 mEq/L Final  . Glucose, Bld 02/16/2013 133* 70 - 99 mg/dL Final  . BUN 29/56/2130 13  6 - 23 mg/dL Final   . Creatinine, Ser 02/16/2013 1.1  0.4 - 1.5 mg/dL Final  . Calcium 86/57/8469 9.5  8.4 - 10.5 mg/dL Final  . GFR 62/95/2841 88.69  >60.00 mL/min Final  . Cholesterol 02/16/2013 161  0 - 200 mg/dL Final   ATP III Classification       Desirable:  < 200 mg/dL               Borderline High:  200 - 239 mg/dL          High:  > = 324 mg/dL  . Triglycerides 02/16/2013 105.0  0.0 - 149.0 mg/dL Final   Normal:  <401 mg/dLBorderline High:  150 - 199 mg/dL  . HDL 02/16/2013 30.60* >39.00 mg/dL Final  . VLDL 02/72/5366 21.0  0.0 - 40.0 mg/dL Final  . LDL Cholesterol 02/16/2013 109* 0 - 99 mg/dL Final  . Total CHOL/HDL Ratio 02/16/2013 5   Final                  Men          Women1/2 Average Risk     3.4          3.3Average Risk          5.0          4.42X Average Risk          9.6          7.13X Average Risk          15.0          11.0                      . Color, UA 02/16/2013 yellow   Final  . Clarity, UA 02/16/2013 clear   Final  . Glucose, UA 02/16/2013 n   Final  . Bilirubin, UA 02/16/2013 n   Final  . Ketones, UA 02/16/2013 n   Final  . Spec Grav, UA 02/16/2013 1.020   Final  . Blood, UA 02/16/2013 n   Final  . pH, UA 02/16/2013 7.0   Final  . Protein, UA 02/16/2013 trace   Final  . Urobilinogen, UA 02/16/2013 4.0   Final  . Nitrite, UA 02/16/2013 n   Final  . Leukocytes, UA 02/16/2013 Negative   Final  . PSA 02/16/2013 0.32  0.10 - 4.00 ng/mL Final  . TSH 02/16/2013 2.65  0.35 - 5.50 uIU/mL Final  . Total Bilirubin 02/16/2013 0.7  0.3 - 1.2 mg/dL Final  . Bilirubin, Direct 02/16/2013 0.1  0.0 - 0.3 mg/dL Final  . Alkaline Phosphatase 02/16/2013 61  39 - 117 U/L Final  . AST 02/16/2013 19  0 - 37 U/L Final  . ALT 02/16/2013 32  0 - 53 U/L Final  . Total Protein 02/16/2013 7.4  6.0 - 8.3 g/dL Final  . Albumin 44/09/4740 3.9  3.5 - 5.2 g/dL Final  . Hemoglobin V9D 02/16/2013 8.1* 4.6 - 6.5 % Final   Glycemic Control Guidelines for People with Diabetes:Non Diabetic:  <6%Goal  of Therapy:  <7%Additional Action Suggested:  >8%   . Microalb, Ur 02/16/2013 0.8  0.0 - 1.9 mg/dL Final  . Creatinine,U 16/04/9603 205.5   Final  . Microalb Creat Ratio 02/16/2013 0.4  0.0 - 30.0 mg/g Final    Physical Examination:  BP 130/84  Pulse 78  Temp(Src) 98.3 F (36.8 C)  Resp 12  Ht 5\' 11"  (1.803 m)  Wt 283 lb 4.8 oz (128.504 kg)  BMI 39.53 kg/m2  SpO2 98%  GENERAL:         Patient appears to have generalized obesity.   HEENT:         Eye exam shows normal external appearance. Fundus exam shows no retinopathy. Oral exam shows normal mucosa .  NECK:         General:  Neck exam shows no lymphadenopathy. Carotids are normal to palpation and no bruit heard. Thyroid is not enlarged and no nodules felt.   LUNGS:         Chest is symmetrical. Lungs are clear to auscultation.Marland Kitchen   HEART:         Heart sounds:  S1 and S2 are normal. No murmurs or clicks heard., no S3 or S4.   ABDOMEN:         General:  There is no distention present. Liver and spleen are not palpable. No other mass or tenderness present.  EXTREMITIES:     There is no edema. No skin lesions present.Marland Kitchen  NEUROLOGICAL:        Vibration sense is mildly reduced in toes. Ankle jerks are absent bilaterally.         Diabetic foot exam:            Inspection  Normal                    Monofilament  Normal  MUSCULOSKELETAL:       There is no enlargement or deformity of the joints. Spine is normal to inspection.Marland Kitchen   PEDAL pulses: Normal SKIN:       No rash or lesions of concern.        ASSESSMENT/PLAN:   Diabetes type 2, uncontrolled - 250.02    He has had long-standing diabetes which is now showing some progression with higher A1c. Currently only taking glipizide and metformin Although he says he has fairly good blood sugars at home his A1c indicates overall higher readings and most likely has postprandial hyperglycemia which he is not checking consistently  Since weight loss would be a priority in his management he will benefit from a  GLP-1 drug. This will also hopefully reduce the progression of his diabetes and have multiple physiological benefits as well as weight loss Discussed how VICTOZA works, demonstrated injection technique, explained titration, benefits, possible side effects and timing of injection He will also switch glipizide to glipizide ER 5 mg daily for convenience and reduced tendency to hypoglycemia Will also continue metformin 2000 mg a day  Complications: Erectile dysfunction, long-standing. No evidence of neuropathy or nephropathy Has been compliant with regular eye exams  HYPERLIPIDEMIA: Currently not on any medications with LDL over 100.  Discussed benefits of statin treatment regardless of baseline cholesterol as recommended by ADA  ? Hypogonadism: He has had mildly decreased total testosterone levels and not clear that he has true hypogonadism. Also has not benefited subjectively from testosterone supplements Discussed that weight loss should improve hypogonadotrophic hypogonadism and would recommend that he stop testosterone supplements and after weight loss  recheck free testosterone along with LH and prolactin levels Will defer this to PCP  HYPERTENSION: Appears well controlled, high normal reading today  Evanne Matsunaga 03/04/2013, 8:38 AM

## 2013-03-04 NOTE — Patient Instructions (Signed)
Start VICTOZA injection with the sample pen once daily at the same time of the day preferably at bedtime.  Dial the dose to 0.6 mg for the first week.  You may  experience nausea in the first few days which usually gets better the After 1 week increase the dose to 1.2mg  daily if no nausea.  You may inject in the stomach, thigh or arm.   You will feel fullness of the stomach with starting the medication and should try to keep portions of food small.    Change glipizide to glipizide ER 5 mg daily  Please check blood sugars at least half the time about 2 hours after any meal and every other day on waking up. Please bring blood sugar monitor to each visit

## 2013-03-09 ENCOUNTER — Other Ambulatory Visit: Payer: Self-pay | Admitting: *Deleted

## 2013-03-09 MED ORDER — ACCU-CHEK FASTCLIX LANCETS MISC: 1.0000 | Freq: Three times a day (TID) | Status: DC

## 2013-03-09 MED ORDER — GLUCOSE BLOOD VI STRP: ORAL_STRIP | Status: DC

## 2013-03-15 ENCOUNTER — Other Ambulatory Visit: Payer: Self-pay | Admitting: Family Medicine

## 2013-03-17 ENCOUNTER — Other Ambulatory Visit: Payer: Self-pay | Admitting: *Deleted

## 2013-03-17 MED ORDER — INSULIN PEN NEEDLE 32G X 5 MM MISC: 1.0000 | Freq: Every day | Status: DC

## 2013-03-25 ENCOUNTER — Encounter: Payer: Self-pay | Admitting: Endocrinology

## 2013-03-25 ENCOUNTER — Ambulatory Visit (INDEPENDENT_AMBULATORY_CARE_PROVIDER_SITE_OTHER): Payer: BC Managed Care – PPO | Admitting: Endocrinology

## 2013-03-25 VITALS — BP 126/76 | HR 86 | Temp 98.3°F | Resp 12 | Ht 71.0 in | Wt 277.0 lb

## 2013-03-25 DIAGNOSIS — E785 Hyperlipidemia, unspecified: Secondary | ICD-10-CM

## 2013-03-25 DIAGNOSIS — IMO0001 Reserved for inherently not codable concepts without codable children: Secondary | ICD-10-CM

## 2013-03-25 MED ORDER — SIMVASTATIN 20 MG PO TABS: 20.0000 mg | ORAL_TABLET | Freq: Every day | ORAL | Status: DC

## 2013-03-25 NOTE — Patient Instructions (Signed)
Please check blood sugars at least half the time about 2 hours after any meal and as directed on waking up. Please bring blood sugar monitor to each visit  Reduce Glipizide ER to 2.5mg  with next Rx

## 2013-03-25 NOTE — Progress Notes (Signed)
Patient ID: Clifford Kramer, male   DOB: [birth year], 47 y.o.   MRN: [id number]   Reason for Appointment : Consultation for Type 2 Diabetes  History of Present Illness          Diagnosis: Type 2 diabetes mellitus, date of diagnosis: 2005     Background information:He was probably started on metformin at the time of diagnosis when his blood sugars were not very high About 5-6 years ago because of higher blood sugars he was also given glipizide He  Was referred here because of blood sugars  Being higherwhen he started working the day shift   RECENT history: since  He appeared to have some progression of his diabetes with A1c going up to 8.1% and difficulty with losing weight he was started on Victoza. He takes this in the evening and is using 1.2 mg   Also was continued on his 2000 mg metformin. He was given extended-release metformin instead of regular  With this his blood sugars are significantly better although still not checking them after meals Has also lost 6 pounds with decreased portions from Victoza which he is tolerating well Oral hypoglycemic drugs the patient is taking are: Metformin and glipizide          Monitors blood glucose: Twice a day.         Glucometer: Accucheck      Blood Glucose readings from blood sugar recall: readings before breakfast: 100-136; avg 03/25/13: 128  126 recently, was 110; hs 150-190        Hypoglycemia frequency: Never.           LIFESTYLE: Meals: 3 meals per day.  eating out about 4 times a week, some fast food His breakfast maybe the cereal or eggs and toast and meat Usually eating fruit and sandwich at lunch, usually a grilled meat at suppertime but sometimes fried, will snack on fruit, potato chips or protein bars Physical activity: exercise: 4/7 days a week, at gym, 90 min          Dietician visit: Most recent: 3-4 years ago              Side effects from medications have been: None   Lab Results  Component Value Date   HGBA1C 8.1* 02/16/2013    Wt Readings from Last 3 Encounters:  03/25/13 277 lb (125.646 kg)  03/04/13 283 lb 4.8 oz (128.504 kg)  02/26/13 288 lb (130.636 kg)   Retinal exam: Most recent: 1/14      Medication List       This list is accurate as of: 03/25/13  9:26 AM.  Always use your most recent med list.               ACCU-CHEK FASTCLIX LANCETS Misc  1 each by Does not apply route 3 (three) times daily.     allopurinol 300 MG tablet  Commonly known as:  ZYLOPRIM  TAKE ONE TAB BY MOUTH ONCE DAILY     aspirin 81 MG tablet  Take 81 mg by mouth daily.     AXIRON 30 MG/ACT Soln  Generic drug:  Testosterone  Place onto the skin.     fluticasone 50 MCG/ACT nasal spray  Commonly known as:  FLONASE  PLACE 2 SPRAYS INTO THE NOSE DAILY.     glipiZIDE 5 MG 24 hr tablet  Commonly known as:  GLUCOTROL XL  Take 1 tablet (5 mg total) by mouth daily.     glucose blood  test strip  Commonly known as:  ACCU-CHEK SMARTVIEW  Use as instructed to check blood sugars 2-3 times per day     Insulin Pen Needle 32G X 5 MM Misc  1 each by Does not apply route daily.     Liraglutide 18 MG/3ML Sopn  Commonly known as:  VICTOZA  Inject 1.2 mg into the skin daily.     lisinopril-hydrochlorothiazide 20-25 MG per tablet  Commonly known as:  PRINZIDE,ZESTORETIC  TAKE 1 TABLET EVERY MORNING     metFORMIN 1000 MG tablet  Commonly known as:  GLUCOPHAGE  TAKE 1 TABLET BY MOUTH TWO TIMES A DAY     omeprazole 20 MG capsule  Commonly known as:  PRILOSEC  Take 20 mg by mouth 2 (two) times daily.     vardenafil 20 MG tablet  Commonly known as:  LEVITRA  Take 1 tablet (20 mg total) by mouth daily as needed.        Allergies:  Allergies  Allergen Reactions  . Penicillins   . Sulfonamide Derivatives     REACTION: itching    Past Medical History  Diagnosis Date  . Diabetes mellitus   . Hypertension     Past Surgical History  Procedure Laterality Date  . Knee surgery      Family History  Problem  Relation Age of Onset  . Diabetes Mother   . Diabetes Maternal Aunt   . Diabetes Maternal Grandmother   . Heart disease Neg Hx     Social History:  reports that he has never smoked. He does not have any smokeless tobacco history on file. He reports that he does not drink alcohol. His drug history is not on file.    Review of Systems      Hyperlipidemia: Most recent LDL is 109, has not been told to take any medications for lipids     He has had a history of erectile dysfunction and uses Levitra as needed     He was put on testosterone supplements about 3 years ago when his level was mildly low around 300  but no further evaluation done. He does not think he feels subjectively better with taking the supplements.     No  testosterone levels available since 1/13   Physical Examination:  BP 126/76  Pulse 86  Temp(Src) 98.3 F (36.8 C)  Resp 12  Ht 5\' 11"  (1.803 m)  Wt 277 lb (125.646 kg)  BMI 38.65 kg/m2  SpO2 98%       ASSESSMENT/PLAN:   Diabetes type 2, uncontrolled - 250.02    He has had a good response to adding Victoza with weight loss and mostly good blood sugars. He has had only high sugars on Monday when he was out of his medication. Tolerating Victoza well and has been able to cut back on portions He is also fairly compliant with exercise Currently not checking his blood sugars after meals as directed and discussed importance of doing that  He has also switched  glipizide to glipizide ER 5 mg daily for convenience and overall more even control, no hypoglycemia with this as yet He is also tolerating metformin 2000 mg a day He will need an A1c on the next visit  Complications: Erectile dysfunction, long-standing. No evidence of neuropathy or nephropathy  HYPERLIPIDEMIA: Currently not on any medications with LDL over 100.  Discussed benefits of statin treatment regardless of baseline cholesterol as recommended by ADA, will start simvastatin 20 mg  ? Hypogonadism: He  has had mildly decreased total testosterone levels and not clear that he has true hypogonadism. Also has not benefited subjectively from testosterone supplements. This can be reassessed once his weight loss occurs  HYPERTENSION: Appears well controlled  With regimen of Zestoretic  Truett Mcfarlan 03/25/2013, 9:26 AM

## 2013-04-24 ENCOUNTER — Other Ambulatory Visit: Payer: Self-pay | Admitting: *Deleted

## 2013-04-24 MED ORDER — GLIPIZIDE ER 5 MG PO TB24: 5.0000 mg | ORAL_TABLET | Freq: Every day | ORAL | Status: DC

## 2013-04-27 ENCOUNTER — Other Ambulatory Visit: Payer: Self-pay | Admitting: Family Medicine

## 2013-04-30 ENCOUNTER — Ambulatory Visit: Payer: BC Managed Care – PPO | Admitting: *Deleted

## 2013-05-01 ENCOUNTER — Other Ambulatory Visit: Payer: Self-pay | Admitting: Family Medicine

## 2013-05-18 ENCOUNTER — Other Ambulatory Visit: Payer: Self-pay | Admitting: *Deleted

## 2013-05-18 DIAGNOSIS — E119 Type 2 diabetes mellitus without complications: Secondary | ICD-10-CM

## 2013-05-19 ENCOUNTER — Other Ambulatory Visit (INDEPENDENT_AMBULATORY_CARE_PROVIDER_SITE_OTHER): Payer: BC Managed Care – PPO

## 2013-05-19 DIAGNOSIS — E119 Type 2 diabetes mellitus without complications: Secondary | ICD-10-CM

## 2013-05-19 LAB — COMPREHENSIVE METABOLIC PANEL
Albumin: 4 g/dL (ref 3.5–5.2)
CO2: 28 mEq/L (ref 19–32)
Calcium: 9.4 mg/dL (ref 8.4–10.5)
GFR: 76.73 mL/min (ref >60.00)
Glucose, Bld: 137 mg/dL — ABNORMAL HIGH (ref 70–99)
Potassium: 4.1 mEq/L (ref 3.5–5.1)
Sodium: 140 mEq/L (ref 135–145)
Total Protein: 7.6 g/dL (ref 6.0–8.3)

## 2013-05-19 LAB — URINALYSIS
Bilirubin Urine: NEGATIVE
Hgb urine dipstick: NEGATIVE
Leukocytes, UA: NEGATIVE
Nitrite: NEGATIVE
Specific Gravity, Urine: 1.025 (ref 1.000–1.030)
Urobilinogen, UA: 1 (ref 0.0–1.0)

## 2013-05-19 LAB — LIPID PANEL
Cholesterol: 109 mg/dL (ref 0–200)
HDL: 31.3 mg/dL — ABNORMAL LOW (ref >39.00)
LDL Cholesterol: 65 mg/dL (ref 0–99)
Total CHOL/HDL Ratio: 3
VLDL: 12.8 mg/dL (ref 0.0–40.0)

## 2013-05-19 LAB — MICROALBUMIN / CREATININE URINE RATIO: Microalb Creat Ratio: 0.6 mg/g (ref 0.0–30.0)

## 2013-05-25 ENCOUNTER — Ambulatory Visit (INDEPENDENT_AMBULATORY_CARE_PROVIDER_SITE_OTHER): Payer: BC Managed Care – PPO | Admitting: Endocrinology

## 2013-05-25 ENCOUNTER — Encounter: Payer: Self-pay | Admitting: Endocrinology

## 2013-05-25 VITALS — BP 134/70 | HR 92 | Temp 98.5°F | Resp 12 | Ht 71.0 in | Wt 278.1 lb

## 2013-05-25 DIAGNOSIS — Z23 Encounter for immunization: Secondary | ICD-10-CM

## 2013-05-25 DIAGNOSIS — E119 Type 2 diabetes mellitus without complications: Secondary | ICD-10-CM

## 2013-05-25 MED ORDER — GLIPIZIDE ER 2.5 MG PO TB24: 2.5000 mg | ORAL_TABLET | Freq: Every day | ORAL | Status: DC

## 2013-05-25 NOTE — Progress Notes (Signed)
Patient ID: Clifford Kramer, male   DOB: Apr 27, 1966, 47 y.o.   MRN: [id number]   Reason for Appointment : for Type 2 Diabetes  History of Present Illness          Diagnosis: Type 2 diabetes mellitus, date of diagnosis: 2005     Background information: He was probably started on metformin at the time of diagnosis when his blood sugars were not very high About 5-6 years ago because of higher blood sugars he was also given glipizide  He appeared to have some progression of his diabetes with A1c going up to 8.1% in 01/2013; also had difficulty with losing weight before he was started on Victoza.  With this his blood sugars were significantly better   RECENT history: He takes Victoza in the evening and is using 1.2 mg   Also has been continued on his 2000 mg metformin. He was given extended-release metformin instead of regular  He has been working night shifts and some of his readings are being checked after coming back home or on waking up. Sporadically will have a high reading because of inconsistent diet His weight has leveled off now  Oral hypoglycemic drugs the patient is taking are: Metformin and glipizide          Monitors blood glucose: Twice a day.         Glucometer: Accucheck      Blood Glucose readings from blood sugar recall: readings before breakfast: 92-115, nonfasting 112-204, not clear which readings are after meals Overall average 120 8/13 readings  Hypoglycemia frequency:  rarely during the night may feel shaky or have sweat. He thinks this happens about once a week          LIFESTYLE: Meals: 3 meals per day.  eating out about 4 times a week, some fast food His breakfast maybe the cereal or eggs and toast and meat Usually eating fruit and sandwich at lunch, usually a grilled meat at suppertime  Physical activity: exercise: 4/7 days a week, at gym, 90 min          Dietician visit:  is scheduled for this week           Side effects from medications have been: None   Wt  Readings from Last 3 Encounters:  05/25/13 278 lb 1.6 oz (126.145 kg)  03/25/13 277 lb (125.646 kg)  03/04/13 283 lb 4.8 oz (128.504 kg)   Lab Results  Component Value Date   HGBA1C 6.9* 05/19/2013   HGBA1C 8.1* 02/16/2013   HGBA1C 6.9* 07/07/2012   Lab Results  Component Value Date   MICROALBUR 1.9 05/19/2013   LDLCALC 65 05/19/2013   CREATININE 1.3 05/19/2013    Retinal exam: Most recent: 1/14      Medication List       This list is accurate as of: 05/25/13 10:20 AM.  Always use your most recent med list.               ACCU-CHEK FASTCLIX LANCETS Misc  1 each by Does not apply route 3 (three) times daily.     allopurinol 300 MG tablet  Commonly known as:  ZYLOPRIM  TAKE ONE TAB BY MOUTH ONCE DAILY     aspirin 81 MG tablet  Take 81 mg by mouth daily.     AXIRON 30 MG/ACT Soln  Generic drug:  Testosterone  Place onto the skin.     fluticasone 50 MCG/ACT nasal spray  Commonly known as:  FLONASE  PLACE 2 SPRAYS INTO THE NOSE DAILY.     glipiZIDE 5 MG 24 hr tablet  Commonly known as:  GLUCOTROL XL  Take 1 tablet (5 mg total) by mouth daily.     glucose blood test strip  Commonly known as:  ACCU-CHEK SMARTVIEW  Use as instructed to check blood sugars 2-3 times per day     Insulin Pen Needle 32G X 5 MM Misc  1 each by Does not apply route daily.     Liraglutide 18 MG/3ML Sopn  Commonly known as:  VICTOZA  Inject 1.2 mg into the skin daily.     lisinopril-hydrochlorothiazide 20-25 MG per tablet  Commonly known as:  PRINZIDE,ZESTORETIC  TAKE 1 TABLET EVERY MORNING     metFORMIN 1000 MG tablet  Commonly known as:  GLUCOPHAGE  TAKE 1 TABLET BY MOUTH TWO TIMES A DAY     omeprazole 20 MG capsule  Commonly known as:  PRILOSEC  Take 20 mg by mouth 2 (two) times daily.     simvastatin 20 MG tablet  Commonly known as:  ZOCOR  Take 1 tablet (20 mg total) by mouth at bedtime.     vardenafil 20 MG tablet  Commonly known as:  LEVITRA  Take 1 tablet (20 mg  total) by mouth daily as needed.        Allergies:  Allergies  Allergen Reactions  . Penicillins   . Sulfonamide Derivatives     REACTION: itching    Past Medical History  Diagnosis Date  . Diabetes mellitus   . Hypertension     Past Surgical History  Procedure Laterality Date  . Knee surgery      Family History  Problem Relation Age of Onset  . Diabetes Mother   . Diabetes Maternal Aunt   . Diabetes Maternal Grandmother   . Heart disease Neg Hx     Social History:  reports that he has never smoked. He does not have any smokeless tobacco history on file. He reports that he does not drink alcohol. His drug history is not on file.    Review of Systems      Hyperlipidemia: Most recent LDL is 65, previously 109, has been tolerating simvastatin     He has had a history of erectile dysfunction and uses Levitra as needed     He was put on testosterone supplements about 3 years ago when his level was mildly low around 300  but no further evaluation done. He does not think he feels subjectively better with taking the supplements.     No  testosterone levels available since 1/13   LABS:  Appointment on 05/19/2013  Component Date Value Range Status  . Hemoglobin A1C 05/19/2013 6.9* 4.6 - 6.5 % Final   Glycemic Control Guidelines for People with Diabetes:Non Diabetic:  <6%Goal of Therapy: <7%Additional Action Suggested:  >8%   . Sodium 05/19/2013 140  135 - 145 mEq/L Final  . Potassium 05/19/2013 4.1  3.5 - 5.1 mEq/L Final  . Chloride 05/19/2013 103  96 - 112 mEq/L Final  . CO2 05/19/2013 28  19 - 32 mEq/L Final  . Glucose, Bld 05/19/2013 137* 70 - 99 mg/dL Final  . BUN 19/14/7829 14  6 - 23 mg/dL Final  . Creatinine, Ser 05/19/2013 1.3  0.4 - 1.5 mg/dL Final  . Total Bilirubin 05/19/2013 0.8  0.3 - 1.2 mg/dL Final  . Alkaline Phosphatase 05/19/2013 67  39 - 117 U/L Final  . AST 05/19/2013 22  0 - 37 U/L Final  . ALT 05/19/2013 29  0 - 53 U/L Final  . Total Protein  05/19/2013 7.6  6.0 - 8.3 g/dL Final  . Albumin 16/04/9603 4.0  3.5 - 5.2 g/dL Final  . Calcium 54/03/8118 9.4  8.4 - 10.5 mg/dL Final  . GFR 14/78/2956 76.73  >60.00 mL/min Final  . Color, Urine 05/19/2013 Dark Yellow  Yellow;Lt. Yellow Final  . APPearance 05/19/2013 CLEAR  Clear Final  . Specific Gravity, Urine 05/19/2013 1.025  1.000-1.030 Final  . pH 05/19/2013 6.5  5.0 - 8.0 Final  . Total Protein, Urine 05/19/2013 NEGATIVE  Negative Final  . Urine Glucose 05/19/2013 NEGATIVE  Negative Final  . Ketones, ur 05/19/2013 NEGATIVE  Negative Final  . Bilirubin Urine 05/19/2013 NEGATIVE  Negative Final  . Hgb urine dipstick 05/19/2013 NEGATIVE  Negative Final  . Urobilinogen, UA 05/19/2013 1.0  0.0 - 1.0 Final  . Leukocytes, UA 05/19/2013 NEGATIVE  Negative Final  . Nitrite 05/19/2013 NEGATIVE  Negative Final  . Microalb, Ur 05/19/2013 1.9  0.0 - 1.9 mg/dL Final  . Creatinine,U 21/30/8657 336.3   Final  . Microalb Creat Ratio 05/19/2013 0.6  0.0 - 30.0 mg/g Final  . Cholesterol 05/19/2013 109  0 - 200 mg/dL Final   ATP III Classification       Desirable:  < 200 mg/dL               Borderline High:  200 - 239 mg/dL          High:  > = 846 mg/dL  . Triglycerides 05/19/2013 64.0  0.0 - 149.0 mg/dL Final   Normal:  <962 mg/dLBorderline High:  150 - 199 mg/dL  . HDL 05/19/2013 31.30* >39.00 mg/dL Final  . VLDL 95/28/4132 12.8  0.0 - 40.0 mg/dL Final  . LDL Cholesterol 05/19/2013 65  0 - 99 mg/dL Final  . Total CHOL/HDL Ratio 05/19/2013 3   Final                  Men          Women1/2 Average Risk     3.4          3.3Average Risk          5.0          4.42X Average Risk          9.6          7.13X Average Risk          15.0          11.0                         Physical Examination:  BP 134/70  Pulse 92  Temp(Src) 98.5 F (36.9 C)  Resp 12  Ht 5\' 11"  (1.803 m)  Wt 278 lb 1.6 oz (126.145 kg)  BMI 38.8 kg/m2  SpO2 97%       ASSESSMENT/PLAN:   Diabetes type 2, uncontrolled - 250.02     He has had a good response to adding Victoza with significantly improved A1c Most of his blood sugars are fairly good with rare high readings He is going to see the dietitian for meal planning instructions Currently doing fairly well with walking program Since he has occasional mild symptoms of hypoglycemia will reduce his glipizide ER 2.5 mg Recommended more glucose monitoring after meals  HYPERLIPIDEMIA: Now well controlled with taking simvastatin 20 mg  HYPERTENSION: Appears well controlled  With regimen of Zestoretic  Petra Sargeant 05/25/2013, 10:20 AM

## 2013-05-25 NOTE — Patient Instructions (Signed)
Please check blood sugars at least half the time about 2 hours after any meal and as directed on waking up. Please bring blood sugar monitor to each visit  Switch to 2.5mg  Glipizide Er

## 2013-05-27 ENCOUNTER — Telehealth: Payer: Self-pay | Admitting: Endocrinology

## 2013-05-27 ENCOUNTER — Ambulatory Visit: Payer: BC Managed Care – PPO | Admitting: *Deleted

## 2013-05-27 NOTE — Telephone Encounter (Signed)
faxed

## 2013-05-30 ENCOUNTER — Encounter: Payer: Self-pay | Admitting: *Deleted

## 2013-05-30 ENCOUNTER — Encounter: Payer: BC Managed Care – PPO | Attending: Endocrinology | Admitting: *Deleted

## 2013-05-30 VITALS — Ht 71.0 in | Wt 271.0 lb

## 2013-05-30 DIAGNOSIS — Z713 Dietary counseling and surveillance: Secondary | ICD-10-CM | POA: Insufficient documentation

## 2013-05-30 DIAGNOSIS — IMO0001 Reserved for inherently not codable concepts without codable children: Secondary | ICD-10-CM | POA: Insufficient documentation

## 2013-05-30 NOTE — Progress Notes (Signed)
Medical Nutrition Therapy:  Appt start time: 1230 end time:  1330.  Assessment:  Patient here today for diabetes education. He has had diabetes since 2005, and saw an RD 2 years ago, but doesn't remember much except portion control. He was recently put on Victoza, which has improved HgbA1c from 8.1 in July 2014 to 6.9 on 05/19/13. He has also lost about 18 pounds in the last 4-5 months. 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 He checks his blood glucose in the morning just before leaving work (115-125), in the evening after waking up (95-100), and 2 hours after dinner (150-160s). He exercises regularly. He does report some hypoglycemia while he sleeps.   MEDICATIONS: Reviewed. Diabetes medications include Victoza daily, glipizide 2.5 mg daily, and 1000 mg metformin twice daily   DIETARY INTAKE:   Usual eating pattern includes 2 meals and 3 snacks per day.  24-hr recall:  B ( AM): Muffin/ham sandwich, water  Snk ( AM): None L ( PM): Apple, banana, grapes, small bag of chips, peanut butter crackers, coffee (Splenda, creamer), water Snk ( PM): Occasionally 2-3 cookies D ( PM): Grilled chicken, mixed vegetables, rice, water Snk ( PM): None Beverages: Water, sweet tea  Usual physical activity: Gym 4-6 days a week, tread climber 45-75 minutes, weights 2-3 days a week  Estimated energy needs: 1800 calories 203 g carbohydrates 113 g protein 60 g fat  Progress Towards Goal(s):  In progress.   Nutritional Diagnosis:  NB-1.1 Food and nutrition-related knowledge deficit As related to diabetes.  As evidenced by patient report.    Intervention:  Nutrition counseling. We discussed basic carb counting, including foods with carbs, label reading, portion size, and meal planning.   Goals:  1. 3-4 carb servings per meal 2. Continue to monitor portion size of carbohydrate foods.  3. Increase carb intake by 1-2 servings at morning meal to prevent hypoglycemia while sleeping.  4.  Incorporate protein food into meal during work time.  5. Continue exercising per current regimen.  Handouts given during visit include:  Living Well With Diabetes booklet  Yellow meal plan card  Monitoring/Evaluation:  Dietary intake, exercise, BG, and body weight prn.

## 2013-06-22 ENCOUNTER — Other Ambulatory Visit: Payer: Self-pay | Admitting: Endocrinology

## 2013-07-09 ENCOUNTER — Other Ambulatory Visit: Payer: Self-pay | Admitting: Endocrinology

## 2013-07-14 ENCOUNTER — Other Ambulatory Visit: Payer: Self-pay | Admitting: *Deleted

## 2013-07-14 MED ORDER — GLIPIZIDE ER 2.5 MG PO TB24: 2.5000 mg | ORAL_TABLET | Freq: Every day | ORAL | Status: DC

## 2013-07-28 ENCOUNTER — Other Ambulatory Visit: Payer: Self-pay | Admitting: Family Medicine

## 2013-07-30 HISTORY — PX: COLONOSCOPY: SHX174

## 2013-08-12 ENCOUNTER — Other Ambulatory Visit: Payer: Self-pay | Admitting: *Deleted

## 2013-08-12 MED ORDER — GLUCOSE BLOOD VI STRP: ORAL_STRIP | Status: DC

## 2013-08-18 ENCOUNTER — Telehealth: Payer: Self-pay | Admitting: Family Medicine

## 2013-08-18 MED ORDER — ALLOPURINOL 300 MG PO TABS: ORAL_TABLET | ORAL | Status: DC

## 2013-08-18 NOTE — Telephone Encounter (Signed)
Pt needs refill on allopurinol [facility]. Pt has sch cpx

## 2013-08-24 ENCOUNTER — Other Ambulatory Visit (INDEPENDENT_AMBULATORY_CARE_PROVIDER_SITE_OTHER): Payer: BC Managed Care – PPO

## 2013-08-24 DIAGNOSIS — E1165 Type 2 diabetes mellitus with hyperglycemia: Principal | ICD-10-CM

## 2013-08-24 DIAGNOSIS — IMO0001 Reserved for inherently not codable concepts without codable children: Secondary | ICD-10-CM

## 2013-08-24 DIAGNOSIS — E785 Hyperlipidemia, unspecified: Secondary | ICD-10-CM

## 2013-08-24 LAB — BASIC METABOLIC PANEL
BUN: 14 mg/dL (ref 6–23)
CO2: 27 meq/L (ref 19–32)
Calcium: 9.3 mg/dL (ref 8.4–10.5)
Chloride: 106 mEq/L (ref 96–112)
Creatinine, Ser: 1.3 mg/dL (ref 0.4–1.5)
GFR: 77.34 mL/min (ref >60.00)
GLUCOSE: 124 mg/dL — AB (ref 70–99)
POTASSIUM: 4.2 meq/L (ref 3.5–5.1)
SODIUM: 139 meq/L (ref 135–145)

## 2013-08-24 LAB — LIPID PANEL
CHOLESTEROL: 106 mg/dL (ref 0–200)
HDL: 33.3 mg/dL — ABNORMAL LOW (ref >39.00)
LDL Cholesterol: 61 mg/dL (ref 0–99)
Total CHOL/HDL Ratio: 3
Triglycerides: 57 mg/dL (ref 0.0–149.0)
VLDL: 11.4 mg/dL (ref 0.0–40.0)

## 2013-08-24 LAB — HEMOGLOBIN A1C: HEMOGLOBIN A1C: 6.8 % — AB (ref 4.6–6.5)

## 2013-08-25 LAB — FRUCTOSAMINE: Fructosamine: 264 umol/L (ref <285)

## 2013-08-31 ENCOUNTER — Ambulatory Visit: Payer: BC Managed Care – PPO | Admitting: Endocrinology

## 2013-09-17 ENCOUNTER — Encounter: Payer: Self-pay | Admitting: Endocrinology

## 2013-09-17 ENCOUNTER — Ambulatory Visit (INDEPENDENT_AMBULATORY_CARE_PROVIDER_SITE_OTHER): Payer: BC Managed Care – PPO | Admitting: Endocrinology

## 2013-09-17 VITALS — BP 128/80 | HR 75 | Temp 98.2°F | Resp 16 | Ht 71.0 in | Wt 280.2 lb

## 2013-09-17 DIAGNOSIS — E785 Hyperlipidemia, unspecified: Secondary | ICD-10-CM

## 2013-09-17 DIAGNOSIS — IMO0001 Reserved for inherently not codable concepts without codable children: Secondary | ICD-10-CM

## 2013-09-17 DIAGNOSIS — E1165 Type 2 diabetes mellitus with hyperglycemia: Principal | ICD-10-CM

## 2013-09-17 NOTE — Progress Notes (Signed)
Patient ID: Clifford Kramer, male   DOB: 12-Jan-1966, 48 y.o.   MRN: [id number]   Reason for Appointment : for Type 2 Diabetes  History of Present Illness          Diagnosis: Type 2 diabetes mellitus, date of diagnosis: 2005     Background information: He was probably started on metformin at the time of diagnosis when his blood sugars were not very high About 5-6 years ago because of higher blood sugars he was also given glipizide  He appeared to have  progression of his diabetes with A1c going up to 8.1% in 01/2013; also had difficulty with losing weight before he was started on Victoza.  With this his blood sugars were significantly better   RECENT history: He appears to be continuing to benefit from taking Victoza. Overall it helps his satiety Also his A1c has been consistently below 7%  now  takes Victoza in the ams and is using 1.2 mg   Also has been continued on his 2000 mg metformin. He was given extended-release metformin instead of regular  Her glipizide was reduced to 2.5 mg in 10/14 because of rare hypoglycemia He has been working night shifts and some of his readings are being checked after coming back home or on waking up. Although he has been doing better with his diet especially with discussion with dietitian he has not exercising as much this winter. His weight has leveled off now  Oral hypoglycemic drugs the patient is taking are: Metformin 2 g and glipizide ER 2.5 mg   Side effects from medications have been: None       Monitors blood glucose:  once or twice a day.         Glucometer: Accucheck      Blood Glucose readings from recall: Fasting 90-110; highest recently 176 nonfasting and overall average 130 for the last 30 and 60 days Hypoglycemia: None recently, lowest glucose in the 80s          LIFESTYLE: Meals: 3 meals per day.  eating out periodically His breakfast maybe the cereal or eggs and toast and meat Usually eating fruit and sandwich at lunch, usually a grilled  meat at suppertime  Physical activity: exercise: 3/7 days a week, at gym, 90 min          Dietician visit: 05/2013           Wt Readings from Last 3 Encounters:  09/17/13 280 lb 3.2 oz (127.098 kg)  05/30/13 271 lb (122.925 kg)  05/25/13 278 lb 1.6 oz (126.145 kg)   Lab Results  Component Value Date   HGBA1C 6.8* 08/24/2013   HGBA1C 6.9* 05/19/2013   HGBA1C 8.1* 02/16/2013   Lab Results  Component Value Date   MICROALBUR 1.9 05/19/2013   LDLCALC 61 08/24/2013   CREATININE 1.3 08/24/2013    Retinal exam: Most recent: 1/14      Medication List       This list is accurate as of: 09/17/13 10:17 AM.  Always use your most recent med list.               ACCU-CHEK FASTCLIX LANCETS Misc  1 each by Does not apply route 3 (three) times daily.     allopurinol 300 MG tablet  Commonly known as:  ZYLOPRIM  TAKE ONE TAB BY MOUTH ONCE DAILY     aspirin 81 MG tablet  Take 81 mg by mouth daily.     AXIRON 30 MG/ACT  Soln  Generic drug:  Testosterone  Place onto the skin.     fluticasone 50 MCG/ACT nasal spray  Commonly known as:  FLONASE  PLACE 2 SPRAYS INTO THE NOSE DAILY.     glipiZIDE 2.5 MG 24 hr tablet  Commonly known as:  GLUCOTROL XL  Take 1 tablet (2.5 mg total) by mouth daily.     glucose blood test strip  Commonly known as:  ACCU-CHEK SMARTVIEW  Use as instructed to check blood sugars 3 times per day     Insulin Pen Needle 32G X 5 MM Misc  1 each by Does not apply route daily.     lisinopril-hydrochlorothiazide 20-25 MG per tablet  Commonly known as:  PRINZIDE,ZESTORETIC  TAKE 1 TABLET EVERY MORNING     lisinopril-hydrochlorothiazide 20-25 MG per tablet  Commonly known as:  PRINZIDE,ZESTORETIC  TAKE 1 TABLET EVERY MORNING     metFORMIN 1000 MG tablet  Commonly known as:  GLUCOPHAGE  TAKE 1 TABLET BY MOUTH TWO TIMES A DAY     omeprazole 20 MG capsule  Commonly known as:  PRILOSEC  Take 20 mg by mouth 2 (two) times daily.     simvastatin 20 MG tablet   Commonly known as:  ZOCOR  Take 1 tablet (20 mg total) by mouth at bedtime.     vardenafil 20 MG tablet  Commonly known as:  LEVITRA  Take 1 tablet (20 mg total) by mouth daily as needed.     VICTOZA 18 MG/3ML Sopn  Generic drug:  Liraglutide  INJECT 1.2 MG INTO THE SKIN DAILY.     VICTOZA 18 MG/3ML Sopn  Generic drug:  Liraglutide  INJECT 1.2 MG INTO THE SKIN DAILY.        Allergies:  Allergies  Allergen Reactions  . Penicillins   . Sulfonamide Derivatives     REACTION: itching    Past Medical History  Diagnosis Date  . Diabetes mellitus   . Hypertension     Past Surgical History  Procedure Laterality Date  . Knee surgery      Family History  Problem Relation Age of Onset  . Diabetes Mother   . Diabetes Maternal Aunt   . Diabetes Maternal Grandmother   . Heart disease Neg Hx     Social History:  reports that he has never smoked. He does not have any smokeless tobacco history on file. He reports that he does not drink alcohol. His drug history is not on file.    Review of Systems      Hyperlipidemia: LDL is controlled, baseline previously 109, has been tolerating simvastatin. Has a slight improvement in HDL     He has had a history of erectile dysfunction and uses Levitra as needed     He was put on testosterone supplements and 2011 when his level was mildly low around 300  but no further evaluation done.  This is being monitored by the urologist. He did not report feeling subjectively better with taking the supplements.     No  testosterone levels available on current record   LABS:  No visits with results within 1 Week(s) from this visit. Latest known visit with results is:  Appointment on 08/24/2013  Component Date Value Ref Range Status  . Sodium 08/24/2013 139  135 - 145 mEq/L Final  . Potassium 08/24/2013 4.2  3.5 - 5.1 mEq/L Final  . Chloride 08/24/2013 106  96 - 112 mEq/L Final  . CO2 08/24/2013 27  19 -  32 mEq/L Final  . Glucose, Bld  08/24/2013 124* 70 - 99 mg/dL Final  . BUN 08/24/2013 14  6 - 23 mg/dL Final  . Creatinine, Ser 08/24/2013 1.3  0.4 - 1.5 mg/dL Final  . Calcium 08/24/2013 9.3  8.4 - 10.5 mg/dL Final  . GFR 08/24/2013 77.34  >60.00 mL/min Final  . Fructosamine 08/24/2013 264  <285 umol/L Final   Comment:                            Variations in levels of serum proteins (albumin and immunoglobulins)                          may affect fructosamine results.                             . Hemoglobin A1C 08/24/2013 6.8* 4.6 - 6.5 % Final   Glycemic Control Guidelines for People with Diabetes:Non Diabetic:  <6%Goal of Therapy: <7%Additional Action Suggested:  >8%   . Cholesterol 08/24/2013 106  0 - 200 mg/dL Final   ATP III Classification       Desirable:  < 200 mg/dL               Borderline High:  200 - 239 mg/dL          High:  > = 240 mg/dL  . Triglycerides 08/24/2013 57.0  0.0 - 149.0 mg/dL Final   Normal:  <150 mg/dLBorderline High:  150 - 199 mg/dL  . HDL 08/24/2013 33.30* >39.00 mg/dL Final  . VLDL 08/24/2013 11.4  0.0 - 40.0 mg/dL Final  . LDL Cholesterol 08/24/2013 61  0 - 99 mg/dL Final  . Total CHOL/HDL Ratio 08/24/2013 3   Final                  Men          Women1/2 Average Risk     3.4          3.3Average Risk          5.0          4.42X Average Risk          9.6          7.13X Average Risk          15.0          11.0                         Physical Examination:  BP 128/80  Pulse 75  Temp(Src) 98.2 F (36.8 C)  Resp 16  Ht 5\' 11"  (1.803 m)  Wt 280 lb 3.2 oz (127.098 kg)  BMI 39.10 kg/m2  SpO2 97%       ASSESSMENT/PLAN:   Diabetes type 2, fair control  He has had a good response to adding Victoza with significantly improved A1c Most of his blood sugars are fairly good although unable to download his meter today He is overall watching his diet fairly well Currently doing fairly well with exercise program although he should do better starting spring; this should help his weight  also He will continue his regimen of low-dose glipizide and maximum dose metformin also  HYPERLIPIDEMIA: Consistently well controlled with taking simvastatin 20 mg  HYPERTENSION: Appears well controlled  With regimen of Zestoretic  Crysten Kaman 09/17/2013,  10:17 AM

## 2013-09-17 NOTE — Patient Instructions (Signed)
One Touch or Contour meters

## 2013-10-15 ENCOUNTER — Other Ambulatory Visit: Payer: Self-pay | Admitting: Endocrinology

## 2013-11-07 ENCOUNTER — Other Ambulatory Visit: Payer: Self-pay | Admitting: Family Medicine

## 2013-12-28 ENCOUNTER — Other Ambulatory Visit: Payer: Self-pay | Admitting: *Deleted

## 2013-12-28 ENCOUNTER — Telehealth: Payer: Self-pay | Admitting: Endocrinology

## 2013-12-28 MED ORDER — LIRAGLUTIDE 18 MG/3ML ~~LOC~~ SOPN: PEN_INJECTOR | SUBCUTANEOUS | Status: DC

## 2013-12-28 NOTE — Telephone Encounter (Signed)
please write victoza in 90 day rx

## 2013-12-28 NOTE — Telephone Encounter (Signed)
rx sent

## 2013-12-29 ENCOUNTER — Other Ambulatory Visit: Payer: Self-pay | Admitting: *Deleted

## 2013-12-29 ENCOUNTER — Telehealth: Payer: Self-pay | Admitting: *Deleted

## 2013-12-29 MED ORDER — LIRAGLUTIDE 18 MG/3ML ~~LOC~~ SOPN: PEN_INJECTOR | SUBCUTANEOUS | Status: DC

## 2013-12-29 NOTE — Telephone Encounter (Signed)
rx was called in for a 90 day supply, patient is aware

## 2013-12-29 NOTE — Telephone Encounter (Signed)
90 day instead of 30 day supply victoza

## 2014-01-11 ENCOUNTER — Other Ambulatory Visit: Payer: BC Managed Care – PPO

## 2014-01-15 ENCOUNTER — Ambulatory Visit: Payer: BC Managed Care – PPO | Admitting: Endocrinology

## 2014-01-25 ENCOUNTER — Other Ambulatory Visit (INDEPENDENT_AMBULATORY_CARE_PROVIDER_SITE_OTHER): Payer: BC Managed Care – PPO

## 2014-01-25 DIAGNOSIS — IMO0001 Reserved for inherently not codable concepts without codable children: Secondary | ICD-10-CM

## 2014-01-25 DIAGNOSIS — E1165 Type 2 diabetes mellitus with hyperglycemia: Principal | ICD-10-CM

## 2014-01-25 LAB — COMPREHENSIVE METABOLIC PANEL
ALBUMIN: 4.1 g/dL (ref 3.5–5.2)
ALK PHOS: 71 U/L (ref 39–117)
ALT: 31 U/L (ref 0–53)
AST: 20 U/L (ref 0–37)
BUN: 15 mg/dL (ref 6–23)
CO2: 28 mEq/L (ref 19–32)
Calcium: 9.1 mg/dL (ref 8.4–10.5)
Chloride: 101 mEq/L (ref 96–112)
Creatinine, Ser: 1.4 mg/dL (ref 0.4–1.5)
GFR: 70.15 mL/min (ref >60.00)
Glucose, Bld: 152 mg/dL — ABNORMAL HIGH (ref 70–99)
Potassium: 4.3 mEq/L (ref 3.5–5.1)
Sodium: 136 mEq/L (ref 135–145)
Total Bilirubin: 0.5 mg/dL (ref 0.2–1.2)
Total Protein: 7.8 g/dL (ref 6.0–8.3)

## 2014-01-25 LAB — MICROALBUMIN / CREATININE URINE RATIO
CREATININE, U: 228.8 mg/dL
Microalb Creat Ratio: 0.7 mg/g (ref 0.0–30.0)
Microalb, Ur: 1.7 mg/dL (ref 0.0–1.9)

## 2014-01-25 LAB — HEMOGLOBIN A1C: Hgb A1c MFr Bld: 7.1 % — ABNORMAL HIGH (ref 4.6–6.5)

## 2014-01-28 ENCOUNTER — Ambulatory Visit: Payer: BC Managed Care – PPO | Admitting: Endocrinology

## 2014-02-05 LAB — HM DIABETES EYE EXAM

## 2014-02-06 ENCOUNTER — Other Ambulatory Visit: Payer: Self-pay | Admitting: Family Medicine

## 2014-02-10 ENCOUNTER — Encounter: Payer: Self-pay | Admitting: Endocrinology

## 2014-02-10 ENCOUNTER — Ambulatory Visit (INDEPENDENT_AMBULATORY_CARE_PROVIDER_SITE_OTHER): Payer: BC Managed Care – PPO | Admitting: Endocrinology

## 2014-02-10 VITALS — BP 104/76 | HR 99 | Temp 98.0°F | Resp 16 | Ht 71.0 in | Wt 281.2 lb

## 2014-02-10 DIAGNOSIS — E1165 Type 2 diabetes mellitus with hyperglycemia: Principal | ICD-10-CM

## 2014-02-10 DIAGNOSIS — IMO0001 Reserved for inherently not codable concepts without codable children: Secondary | ICD-10-CM

## 2014-02-10 DIAGNOSIS — E7800 Pure hypercholesterolemia, unspecified: Secondary | ICD-10-CM

## 2014-02-10 DIAGNOSIS — I10 Essential (primary) hypertension: Secondary | ICD-10-CM

## 2014-02-10 NOTE — Patient Instructions (Addendum)
Victoza 1.8 mg daily  Please check blood sugars at least half the time about 2 hours after any meal and times per week on waking up. Please bring blood sugar monitor to each visit

## 2014-02-10 NOTE — Progress Notes (Signed)
Patient ID: Clifford Kramer, male   DOB: [birth year], 48 y.o.   MRN: [id number]   Reason for Appointment : Followup for Type 2 Diabetes  History of Present Illness          Diagnosis: Type 2 diabetes mellitus, date of diagnosis: 2005     Background information: He was probably started on metformin at the time of diagnosis when his blood sugars were not very high About 5-6 years ago because of higher blood sugars he was also given glipizide  He appeared to have  progression of his diabetes with A1c going up to 8.1% in 01/2013; also had difficulty with losing weight before he was started on Victoza.  With this his blood sugars were significantly better His glipizide was reduced to 2.5 mg in 10/14 because of rare hypoglycemia   RECENT history:  He has not been seen in followup since 2/15 Although he had good control on his last visit with metformin, Victoza and low-dose glipizide his A1c is relatively higher Also previously with starting Victoza he had lost weight but this has leveled off now However he has been consistent blood sugar levels at home especially in the mornings and this may be related to inconsistent diet He admits to having difficulty controlling portions and carbohydrates as the reason for variability in difficulty losing weight He had to leave off exercise for a month because of knee pain He is compliant with 1.2 mg Victoza in the morning hours and has no side effects Checking blood sugars one to 2 times a day although morning readings are variable at times depending on his work shift which starts between 2-5 AM He previously has had a discussion with dietitian   Oral hypoglycemic drugs the patient is taking are: Metformin 2 g and glipizide ER 2.5 mg    Side effects from medications have been: None       Monitors blood glucose:  once or twice a day.         Glucometer: Accucheck      Blood Glucose readings from download:  PREMEAL  early a.m.   late a.m.  Dinner Bedtime Overall   Glucose range: 130-200   110-200  131-189   154-274    Mean/median:  162      157    Hypoglycemia: None          LIFESTYLE: Meals: 3 meals per day.  eating out periodically His breakfast maybe the cereal or eggs and toast and meat Usually eating fruit and sandwich at lunch, usually a grilled meat at suppertime  Physical activity: exercise: 3/7 days a week, at gym, 90 min          Dietician visit: 05/2013           Wt Readings from Last 3 Encounters:  02/10/14 281 lb 3.2 oz (127.551 kg)  09/17/13 280 lb 3.2 oz (127.098 kg)  05/30/13 271 lb (122.925 kg)   Lab Results  Component Value Date   HGBA1C 7.1* 01/25/2014   HGBA1C 6.8* 08/24/2013   HGBA1C 6.9* 05/19/2013   Lab Results  Component Value Date   MICROALBUR 1.7 01/25/2014   LDLCALC 61 08/24/2013   CREATININE 1.4 01/25/2014    Retinal exam: Most recent: 1/14      Medication List       This list is accurate as of: 02/10/14  3:41 PM.  Always use your most recent med list.  ACCU-CHEK FASTCLIX LANCETS Misc  1 each by Does not apply route 3 (three) times daily.     allopurinol 300 MG tablet  Commonly known as:  ZYLOPRIM  TAKE 1 TABLET EVERY DAY     aspirin 81 MG tablet  Take 81 mg by mouth daily.     AXIRON 30 MG/ACT Soln  Generic drug:  Testosterone  Place onto the skin.     esomeprazole 40 MG capsule  Commonly known as:  NEXIUM  Take 40 mg by mouth daily at 12 noon.     fluticasone 50 MCG/ACT nasal spray  Commonly known as:  FLONASE  PLACE 2 SPRAYS INTO THE NOSE DAILY.     glipiZIDE 2.5 MG 24 hr tablet  Commonly known as:  GLUCOTROL XL  Take 1 tablet (2.5 mg total) by mouth daily.     glucose blood test strip  Commonly known as:  ACCU-CHEK SMARTVIEW  Use as instructed to check blood sugars 3 times per day     Insulin Pen Needle 32G X 5 MM Misc  1 each by Does not apply route daily.     Liraglutide 18 MG/3ML Sopn  Commonly known as:  VICTOZA  Inject 1.2 mg daily      lisinopril-hydrochlorothiazide 20-25 MG per tablet  Commonly known as:  PRINZIDE,ZESTORETIC  TAKE 1 TABLET TWICE A DAY     metFORMIN 1000 MG tablet  Commonly known as:  GLUCOPHAGE  TAKE 1 TABLET BY MOUTH TWO TIMES A DAY     omeprazole 40 MG capsule  Commonly known as:  PRILOSEC     simvastatin 20 MG tablet  Commonly known as:  ZOCOR  Take 1 tablet (20 mg total) by mouth at bedtime.     vardenafil 20 MG tablet  Commonly known as:  LEVITRA  Take 1 tablet (20 mg total) by mouth daily as needed.        Allergies:  Allergies  Allergen Reactions  . Penicillins   . Sulfonamide Derivatives     REACTION: itching    Past Medical History  Diagnosis Date  . Diabetes mellitus   . Hypertension     Past Surgical History  Procedure Laterality Date  . Knee surgery      Family History  Problem Relation Age of Onset  . Diabetes Mother   . Diabetes Maternal Aunt   . Diabetes Maternal Grandmother   . Heart disease Neg Hx     Social History:  reports that he has never smoked. He does not have any smokeless tobacco history on file. He reports that he does not drink alcohol. His drug history is not on file.    Review of Systems      Hyperlipidemia: LDL has been controlled, baseline previously 109, has been tolerating simvastatin. Has a slight improvement in HDL     He has had a history of erectile dysfunction and uses Levitra as needed     He was put on testosterone supplements and 2011 when his level was mildly low around 300  but no further evaluation done.  This is being monitored by the urologist. He did not report feeling subjectively better with taking the supplements.  No  testosterone levels available on current record   LABS:  No visits with results within 1 Week(s) from this visit. Latest known visit with results is:  Appointment on 01/25/2014  Component Date Value Ref Range Status  . Hemoglobin A1C 01/25/2014 7.1* 4.6 - 6.5 % Final   Glycemic  Control  Guidelines for People with Diabetes:Non Diabetic:  <6%Goal of Therapy: <7%Additional Action Suggested:  >8%   . Sodium 01/25/2014 136  135 - 145 mEq/L Final  . Potassium 01/25/2014 4.3  3.5 - 5.1 mEq/L Final  . Chloride 01/25/2014 101  96 - 112 mEq/L Final  . CO2 01/25/2014 28  19 - 32 mEq/L Final  . Glucose, Bld 01/25/2014 152* 70 - 99 mg/dL Final  . BUN 01/25/2014 15  6 - 23 mg/dL Final  . Creatinine, Ser 01/25/2014 1.4  0.4 - 1.5 mg/dL Final  . Total Bilirubin 01/25/2014 0.5  0.2 - 1.2 mg/dL Final  . Alkaline Phosphatase 01/25/2014 71  39 - 117 U/L Final  . AST 01/25/2014 20  0 - 37 U/L Final  . ALT 01/25/2014 31  0 - 53 U/L Final  . Total Protein 01/25/2014 7.8  6.0 - 8.3 g/dL Final  . Albumin 01/25/2014 4.1  3.5 - 5.2 g/dL Final  . Calcium 01/25/2014 9.1  8.4 - 10.5 mg/dL Final  . GFR 01/25/2014 70.15  >60.00 mL/min Final  . Microalb, Ur 01/25/2014 1.7  0.0 - 1.9 mg/dL Final  . Creatinine,U 01/25/2014 228.8   Final  . Microalb Creat Ratio 01/25/2014 0.7  0.0 - 30.0 mg/g Final     Physical Examination:  BP 123/86  Pulse 99  Temp(Src) 98 F (36.7 C)  Resp 16  Ht 5\' 11"  (1.803 m)  Wt 281 lb 3.2 oz (127.551 kg)  BMI 39.24 kg/m2  SpO2 99%      repeat blood pressure by myself 104/76  ASSESSMENT/PLAN:   Diabetes type 2, fair control  He has variable blood sugar control with some relatively high readings late at night and overnight This is likely to be from inconsistent diet and exercise level Also has difficulty losing weight Currently not doing many readings after meals and discussed timing of glucose monitoring and targets He may benefit from increasing the total to 1.8 mg since this will help overall control as well as satiety He will continue his regimen of low-dose glipizide and maximum dose metformin also  HYPERLIPIDEMIA: Previously well controlled with taking simvastatin 20 mg  HYPERTENSION: Appears well controlled  With regimen of Zestoretic once a  day  Altheia Shafran 02/10/2014, 3:41 PM

## 2014-02-18 ENCOUNTER — Other Ambulatory Visit: Payer: Self-pay | Admitting: Family Medicine

## 2014-02-22 ENCOUNTER — Other Ambulatory Visit (INDEPENDENT_AMBULATORY_CARE_PROVIDER_SITE_OTHER): Payer: BC Managed Care – PPO

## 2014-02-22 DIAGNOSIS — Z0000 Encounter for general adult medical examination without abnormal findings: Secondary | ICD-10-CM

## 2014-02-22 LAB — LIPID PANEL
CHOLESTEROL: 102 mg/dL (ref 0–200)
HDL: 31.7 mg/dL — AB (ref >39.00)
LDL Cholesterol: 54 mg/dL (ref 0–99)
NONHDL: 70.3
Total CHOL/HDL Ratio: 3
Triglycerides: 84 mg/dL (ref 0.0–149.0)
VLDL: 16.8 mg/dL (ref 0.0–40.0)

## 2014-02-22 LAB — CBC WITH DIFFERENTIAL/PLATELET
BASOS ABS: 0 10*3/uL (ref 0.0–0.1)
Basophils Relative: 0.3 % (ref 0.0–3.0)
EOS ABS: 0.1 10*3/uL (ref 0.0–0.7)
Eosinophils Relative: 1.8 % (ref 0.0–5.0)
HCT: 44.7 % (ref 39.0–52.0)
HEMOGLOBIN: 14.7 g/dL (ref 13.0–17.0)
LYMPHS ABS: 2.6 10*3/uL (ref 0.7–4.0)
LYMPHS PCT: 40.6 % (ref 12.0–46.0)
MCHC: 33 g/dL (ref 30.0–36.0)
MCV: 79.9 fl (ref 78.0–100.0)
MONO ABS: 0.4 10*3/uL (ref 0.1–1.0)
Monocytes Relative: 6.7 % (ref 3.0–12.0)
NEUTROS ABS: 3.3 10*3/uL (ref 1.4–7.7)
Neutrophils Relative %: 50.6 % (ref 43.0–77.0)
Platelets: 230 10*3/uL (ref 150.0–400.0)
RBC: 5.59 Mil/uL (ref 4.22–5.81)
RDW: 14.7 % (ref 11.5–15.5)
WBC: 6.5 10*3/uL (ref 4.0–10.5)

## 2014-02-22 LAB — POCT URINALYSIS DIPSTICK
Bilirubin, UA: NEGATIVE
Blood, UA: NEGATIVE
Glucose, UA: NEGATIVE
Ketones, UA: NEGATIVE
LEUKOCYTES UA: NEGATIVE
NITRITE UA: NEGATIVE
PH UA: 6
Spec Grav, UA: >=1.03
Urobilinogen, UA: 0.2

## 2014-02-22 LAB — HEPATIC FUNCTION PANEL
ALT: 28 U/L (ref 0–53)
AST: 19 U/L (ref 0–37)
Albumin: 3.7 g/dL (ref 3.5–5.2)
Alkaline Phosphatase: 73 U/L (ref 39–117)
Bilirubin, Direct: 0.1 mg/dL (ref 0.0–0.3)
Total Bilirubin: 0.4 mg/dL (ref 0.2–1.2)
Total Protein: 6.8 g/dL (ref 6.0–8.3)

## 2014-02-22 LAB — BASIC METABOLIC PANEL
BUN: 15 mg/dL (ref 6–23)
CALCIUM: 9.3 mg/dL (ref 8.4–10.5)
CHLORIDE: 105 meq/L (ref 96–112)
CO2: 27 meq/L (ref 19–32)
Creatinine, Ser: 1.3 mg/dL (ref 0.4–1.5)
GFR: 74.47 mL/min (ref >60.00)
Glucose, Bld: 128 mg/dL — ABNORMAL HIGH (ref 70–99)
Potassium: 4.2 mEq/L (ref 3.5–5.1)
SODIUM: 141 meq/L (ref 135–145)

## 2014-02-22 LAB — HEMOGLOBIN A1C: HEMOGLOBIN A1C: 7.4 % — AB (ref 4.6–6.5)

## 2014-02-22 LAB — MICROALBUMIN / CREATININE URINE RATIO
Creatinine,U: 389.1 mg/dL
Microalb Creat Ratio: 0.7 mg/g (ref 0.0–30.0)
Microalb, Ur: 2.7 mg/dL — ABNORMAL HIGH (ref 0.0–1.9)

## 2014-02-22 LAB — TSH: TSH: 2.98 u[IU]/mL (ref 0.35–4.50)

## 2014-02-22 LAB — PSA: PSA: 0.33 ng/mL (ref 0.10–4.00)

## 2014-03-01 ENCOUNTER — Encounter: Payer: BC Managed Care – PPO | Admitting: Family Medicine

## 2014-03-08 ENCOUNTER — Ambulatory Visit (INDEPENDENT_AMBULATORY_CARE_PROVIDER_SITE_OTHER): Payer: BC Managed Care – PPO | Admitting: Family Medicine

## 2014-03-08 ENCOUNTER — Encounter: Payer: Self-pay | Admitting: Family Medicine

## 2014-03-08 VITALS — BP 110/80 | Temp 98.2°F | Ht 70.25 in | Wt 281.0 lb

## 2014-03-08 DIAGNOSIS — E119 Type 2 diabetes mellitus without complications: Secondary | ICD-10-CM

## 2014-03-08 DIAGNOSIS — Z23 Encounter for immunization: Secondary | ICD-10-CM

## 2014-03-08 DIAGNOSIS — IMO0001 Reserved for inherently not codable concepts without codable children: Secondary | ICD-10-CM

## 2014-03-08 DIAGNOSIS — E1165 Type 2 diabetes mellitus with hyperglycemia: Secondary | ICD-10-CM

## 2014-03-08 DIAGNOSIS — E785 Hyperlipidemia, unspecified: Secondary | ICD-10-CM

## 2014-03-08 DIAGNOSIS — G4733 Obstructive sleep apnea (adult) (pediatric): Secondary | ICD-10-CM

## 2014-03-08 DIAGNOSIS — M109 Gout, unspecified: Secondary | ICD-10-CM

## 2014-03-08 DIAGNOSIS — I10 Essential (primary) hypertension: Secondary | ICD-10-CM

## 2014-03-08 DIAGNOSIS — F528 Other sexual dysfunction not due to a substance or known physiological condition: Secondary | ICD-10-CM

## 2014-03-08 MED ORDER — FLUTICASONE PROPIONATE 50 MCG/ACT NA SUSP: NASAL | Status: DC

## 2014-03-08 MED ORDER — SIMVASTATIN 20 MG PO TABS: 20.0000 mg | ORAL_TABLET | Freq: Every day | ORAL | Status: DC

## 2014-03-08 MED ORDER — ALLOPURINOL 300 MG PO TABS: ORAL_TABLET | ORAL | Status: DC

## 2014-03-08 MED ORDER — ESOMEPRAZOLE MAGNESIUM 40 MG PO CPDR: 40.0000 mg | DELAYED_RELEASE_CAPSULE | Freq: Every day | ORAL | Status: DC

## 2014-03-08 MED ORDER — VARDENAFIL HCL 20 MG PO TABS: 20.0000 mg | ORAL_TABLET | Freq: Every day | ORAL | Status: DC | PRN

## 2014-03-08 NOTE — Progress Notes (Signed)
Pre visit review using our clinic review tool, if applicable. No additional management support is needed unless otherwise documented below in the visit note. Lab Results  Component Value Date   HGBA1C 7.4* 02/22/2014   HGBA1C 7.1* 01/25/2014   HGBA1C 6.8* 08/24/2013   Lab Results  Component Value Date   MICROALBUR 2.7* 02/22/2014   LDLCALC 54 02/22/2014   CREATININE 1.3 02/22/2014

## 2014-03-08 NOTE — Progress Notes (Signed)
   Subjective:    Patient ID: Clifford Kramer, male    DOB: June 25, 1966, 48 y.o.   MRN: [id number]  HPI [name] is a 48 year old male who comes in today for evaluation of gout, low testosterone,,,,,,,,,,,, followed by urology on this issue,,,,,,,, reflux esophagitis, allergic rhinitis, diabetes type 2, hypertension, hyperlipidemia, and obesity the classic metabolic syndrome  He did lose 15 pounds again tethered back. He sees Dr. Dwyane Dee and endocrinology  He says overall he feels well and has no complaints  He gets routine eye care, dental care, due to have his first screening colonoscopy  Vaccinations updated by Apolonio Schneiders   Review of Systems  Constitutional: Negative.   HENT: Negative.   Eyes: Negative.   Respiratory: Negative.   Cardiovascular: Negative.   Gastrointestinal: Negative.   Genitourinary: Negative.   Musculoskeletal: Negative.   Skin: Negative.   Neurological: Negative.   Psychiatric/Behavioral: Negative.        Objective:   Physical Exam  Nursing note and vitals reviewed. Constitutional: He is oriented to person, place, and time. He appears well-developed and well-nourished. No distress.  HENT:  Head: Normocephalic and atraumatic.  Right Ear: External ear normal.  Left Ear: External ear normal.  Nose: Nose normal.  Mouth/Throat: Oropharynx is clear and moist.  Eyes: Conjunctivae and EOM are normal. Pupils are equal, round, and reactive to light.  Neck: Normal range of motion. Neck supple. No JVD present. No tracheal deviation present. No thyromegaly present.  Cardiovascular: Normal rate, regular rhythm, normal heart sounds and intact distal pulses.  Exam reveals no gallop and no friction rub.   No murmur heard. Pulmonary/Chest: Effort normal and breath sounds normal. No stridor. No respiratory distress. He has no wheezes. He has no rales. He exhibits no tenderness.  Abdominal: Soft. Bowel sounds are normal. He exhibits no distension and no mass. There is no  tenderness. There is no rebound and no guarding.  Genitourinary: Rectum normal, prostate normal and penis normal. Guaiac negative stool. No penile tenderness.  Musculoskeletal: Normal range of motion. He exhibits no edema and no tenderness.  Lymphadenopathy:    He has no cervical adenopathy.  Neurological: He is alert and oriented to person, place, and time. He has normal reflexes. No cranial nerve deficit. He exhibits normal muscle tone.  Skin: Skin is warm and dry. No rash noted. He is not diaphoretic. No erythema. No pallor.  Psychiatric: He has a normal mood and affect. His behavior is normal. Judgment and thought content normal.          Assessment & Plan:  Healthy male  Diabetes type 2,,,,,,,,, continue current therapy,,,,,,, followup by endocrinology  ,, Low testosterone,,,,,,,, followed by urology  Hypertension at goal continue current therapy  Hyperlipidemia goal continue current therapy  Obesity again stressed diet exercise and weight loss  Reflux esophagitis continue Prilosec,,

## 2014-03-08 NOTE — Patient Instructions (Signed)
Continue current medications  Walk 30 minutes daily  Annual physical exam next August or September  Followup with endocrinology and urology as outlined

## 2014-03-09 ENCOUNTER — Telehealth: Payer: Self-pay | Admitting: Family Medicine

## 2014-03-09 NOTE — Telephone Encounter (Signed)
Relevant patient education assigned to patient using Emmi. ° °

## 2014-03-22 ENCOUNTER — Other Ambulatory Visit: Payer: Self-pay | Admitting: *Deleted

## 2014-03-22 ENCOUNTER — Other Ambulatory Visit: Payer: Self-pay | Admitting: Family Medicine

## 2014-03-22 ENCOUNTER — Telehealth: Payer: Self-pay | Admitting: Endocrinology

## 2014-03-22 MED ORDER — LIRAGLUTIDE 18 MG/3ML ~~LOC~~ SOPN: PEN_INJECTOR | SUBCUTANEOUS | Status: DC

## 2014-03-22 NOTE — Telephone Encounter (Signed)
Finished prescription, need med Victoza 1.8 mg  Refilled. Please advise if a problem.

## 2014-03-22 NOTE — Telephone Encounter (Signed)
rx sent

## 2014-04-15 ENCOUNTER — Other Ambulatory Visit: Payer: Self-pay | Admitting: *Deleted

## 2014-04-15 ENCOUNTER — Telehealth: Payer: Self-pay | Admitting: Family Medicine

## 2014-04-15 DIAGNOSIS — Z1211 Encounter for screening for malignant neoplasm of colon: Secondary | ICD-10-CM

## 2014-04-15 DIAGNOSIS — E785 Hyperlipidemia, unspecified: Secondary | ICD-10-CM

## 2014-04-15 MED ORDER — SIMVASTATIN 20 MG PO TABS: 20.0000 mg | ORAL_TABLET | Freq: Every day | ORAL | Status: DC

## 2014-04-15 NOTE — Telephone Encounter (Signed)
Referral placed.

## 2014-04-15 NOTE — Telephone Encounter (Signed)
Pt states dr Sherren Mocha was to have referred him for a colonoscopy since he has turned 60. Pt has been waiting to hear.

## 2014-05-03 ENCOUNTER — Encounter: Payer: Self-pay | Admitting: Internal Medicine

## 2014-05-13 ENCOUNTER — Other Ambulatory Visit: Payer: Self-pay | Admitting: Endocrinology

## 2014-05-17 ENCOUNTER — Other Ambulatory Visit: Payer: Self-pay | Admitting: *Deleted

## 2014-05-17 ENCOUNTER — Encounter: Payer: Self-pay | Admitting: Endocrinology

## 2014-05-17 MED ORDER — LIRAGLUTIDE 18 MG/3ML ~~LOC~~ SOPN: PEN_INJECTOR | SUBCUTANEOUS | Status: DC

## 2014-05-18 ENCOUNTER — Other Ambulatory Visit: Payer: Self-pay | Admitting: *Deleted

## 2014-05-18 ENCOUNTER — Telehealth: Payer: Self-pay | Admitting: Endocrinology

## 2014-05-18 MED ORDER — ONETOUCH VERIO IQ SYSTEM W/DEVICE KIT: PACK | Status: DC

## 2014-05-18 MED ORDER — ONETOUCH DELICA LANCETS FINE MISC: Status: DC

## 2014-05-18 MED ORDER — GLUCOSE BLOOD VI STRP: ORAL_STRIP | Status: DC

## 2014-05-18 NOTE — Telephone Encounter (Signed)
rx was sent in for a 90 day supply yesterday, patient aware

## 2014-05-18 NOTE — Telephone Encounter (Signed)
Pt needs a rewritten RX for the Victoza he is taking it by needle dosage 1.8 mg 1 time in AM

## 2014-05-18 NOTE — Telephone Encounter (Signed)
90 day supply

## 2014-05-19 ENCOUNTER — Telehealth: Payer: Self-pay | Admitting: Endocrinology

## 2014-05-19 NOTE — Telephone Encounter (Signed)
Clifford Kramer [facility] [address] [facility] [address] , rx victozia  needs a 90 day supply he only has a couple doses left

## 2014-05-19 NOTE — Telephone Encounter (Signed)
rx was sent on 05/17/14, pharmacist is confused spoke with them to clear it up

## 2014-05-21 ENCOUNTER — Encounter: Payer: Self-pay | Admitting: Emergency Medicine

## 2014-05-21 ENCOUNTER — Emergency Department
Admission: EM | Admit: 2014-05-21 | Discharge: 2014-05-21 | Disposition: A | Discharge status: Home / Self Care | Payer: BC Managed Care – PPO | Source: Home / Self Care | Attending: Emergency Medicine | Admitting: Emergency Medicine

## 2014-05-21 DIAGNOSIS — L03011 Cellulitis of right finger: Secondary | ICD-10-CM

## 2014-05-21 MED ORDER — INDOMETHACIN 25 MG PO CAPS: 25.0000 mg | ORAL_CAPSULE | Freq: Three times a day (TID) | ORAL | Status: DC | PRN

## 2014-05-21 MED ORDER — DOXYCYCLINE HYCLATE 100 MG PO CAPS: 100.0000 mg | ORAL_CAPSULE | Freq: Two times a day (BID) | ORAL | Status: DC

## 2014-05-21 NOTE — ED Provider Notes (Signed)
CSN: [id number]     Arrival date & time 05/21/14  1202 History   First MD Initiated Contact with Patient 05/21/14 1217     Chief Complaint  Patient presents with  . 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 Has a h/o gout (usually in hands) that feels like it's flaring up in that hand as well.  No F/C, N/V.  Able to express pus last night.  Already taking Allopurinol which decreases frequency of gout flares.    Past Medical History  Diagnosis Date  . Diabetes mellitus   . Hypertension    Past Surgical History  Procedure Laterality Date  . Knee surgery     Family History  Problem Relation Age of Onset  . Diabetes Mother   . Diabetes Maternal Aunt   . Diabetes Maternal Grandmother   . Heart disease Neg Hx    History  Substance Use Topics  . Smoking status: Never Smoker   . Smokeless tobacco: Not on file  . Alcohol Use: No    Review of Systems  All other systems reviewed and are negative.   Allergies  Penicillins and Sulfonamide derivatives  Home Medications   Prior to Admission medications   Medication Sig Start Date End Date Taking? Authorizing Provider  allopurinol (ZYLOPRIM) 300 MG tablet TAKE 1 TABLET EVERY DAY 03/08/14   Dorena Cookey, MD  aspirin 81 MG tablet Take 81 mg by mouth daily.      Historical Provider, MD  BD PEN NEEDLE NANO U/F 32G X 4 MM MISC USE EVERY DAY 05/14/14   Elayne Snare, MD  Blood Glucose Monitoring Suppl (ONETOUCH VERIO IQ SYSTEM) W/DEVICE KIT Use to check blood sugar 2 times daily 05/18/14   Elayne Snare, MD  doxycycline (VIBRAMYCIN) 100 MG capsule Take 1 capsule (100 mg total) by mouth 2 (two) times daily. 05/21/14   Janeann Forehand, MD  esomeprazole (NEXIUM) 40 MG capsule Take 1 capsule (40 mg total) by mouth daily at 12 noon. 03/08/14   Dorena Cookey,  MD  fluticasone (FLONASE) 50 MCG/ACT nasal spray PLACE 2 SPRAYS INTO THE NOSE DAILY. 03/08/14   Dorena Cookey, MD  glipiZIDE (GLUCOTROL XL) 2.5 MG 24 hr tablet Take 1 tablet (2.5 mg total) by mouth daily. 07/14/13   Elayne Snare, MD  glucose blood (ONETOUCH VERIO) test strip Use as instructed to check blood sugar 2 times per day 05/18/14   Elayne Snare, MD  indomethacin (INDOCIN) 25 MG capsule Take 1 capsule (25 mg total) by mouth 3 (three) times daily as needed (PRN for gout pain). 05/21/14   Janeann Forehand, MD  Liraglutide (VICTOZA) 18 MG/3ML SOPN Inject 1.8 mg daily 05/17/14   Elayne Snare, MD  lisinopril-hydrochlorothiazide (PRINZIDE,ZESTORETIC) 20-25 MG per tablet TAKE 1 TABLET EVERY MORNING 02/18/14   Dorena Cookey, MD  metFORMIN (GLUCOPHAGE) 1000 MG tablet TAKE 1 TABLET BY MOUTH TWO TIMES A DAY 03/22/14   Dorena Cookey, MD  omeprazole (PRILOSEC) 40 MG capsule  11/09/13   Historical Provider, MD  Glory Rosebush DELICA LANCETS FINE MISC Use to check blood sugar 2 times per day 05/18/14   Elayne Snare, MD  simvastatin (ZOCOR) 20 MG tablet Take 1 tablet (20 mg total) by mouth at bedtime. 04/15/14   Elayne Snare, MD  Testosterone Hinda Kehr) 30 MG/ACT SOLN Place onto the skin.  Historical Provider, MD  vardenafil (LEVITRA) 20 MG tablet Take 1 tablet (20 mg total) by mouth daily as needed. 03/08/14   Dorena Cookey, MD   BP 138/84  Pulse 82  Temp(Src) 97.7 F (36.5 C) (Oral)  Ht 5' 10" (1.778 m)  Wt 284 lb (128.822 kg)  BMI 40.75 kg/m2  SpO2 97% Physical Exam  Nursing note and vitals reviewed. Constitutional: He is oriented to person, place, and time. He appears well-developed and well-nourished.  HENT:  Head: Normocephalic and atraumatic.  Eyes: No scleral icterus.  Neck: Neck supple.  Cardiovascular: Regular rhythm and normal heart sounds.   Pulmonary/Chest: Effort normal and breath sounds normal. No respiratory distress.  Musculoskeletal:       Hands: Small amount of swelling as indicated w/  mild TTP.  No other swelling noted.  FROM IP & MCP.  Full strength.  No pus able to be expressed.  DNVI.  Neurological: He is alert and oriented to person, place, and time.  Skin: Skin is warm and dry.  Psychiatric: He has a normal mood and affect. His speech is normal.    ED Course  Procedures (including critical care time) Labs Review Labs Reviewed - No data to display  Imaging Review No results found.   MDM   1. Paronychia, right    Pt with paronychia and possible gout flare. Doxy + Indocin given.  Warm soaks, rest.  Work note given for tonight. Follow up with PCP as directed.   Discharge Medication List as of 05/21/2014 12:34 PM    START taking these medications   Details  doxycycline (VIBRAMYCIN) 100 MG capsule Take 1 capsule (100 mg total) by mouth 2 (two) times daily., Starting 05/21/2014, Until Discontinued, Normal    indomethacin (INDOCIN) 25 MG capsule Take 1 capsule (25 mg total) by mouth 3 (three) times daily as needed (PRN for gout pain)., Starting 05/21/2014, Until Discontinued, Normal        Janeann Forehand, MD 05/21/14 1236

## 2014-05-21 NOTE — ED Notes (Signed)
Rt thumb infection, clipped nail too close about one week ago, drainage around nail last night. Also thinks he is starting to have a gout episode in right hand.

## 2014-05-24 ENCOUNTER — Other Ambulatory Visit: Payer: BC Managed Care – PPO

## 2014-05-25 ENCOUNTER — Other Ambulatory Visit (INDEPENDENT_AMBULATORY_CARE_PROVIDER_SITE_OTHER): Payer: BC Managed Care – PPO

## 2014-05-25 DIAGNOSIS — E7800 Pure hypercholesterolemia, unspecified: Secondary | ICD-10-CM

## 2014-05-25 DIAGNOSIS — IMO0002 Reserved for concepts with insufficient information to code with codable children: Secondary | ICD-10-CM

## 2014-05-25 DIAGNOSIS — E1165 Type 2 diabetes mellitus with hyperglycemia: Secondary | ICD-10-CM

## 2014-05-25 LAB — COMPREHENSIVE METABOLIC PANEL
ALT: 30 U/L (ref 0–53)
AST: 23 U/L (ref 0–37)
Albumin: 3.5 g/dL (ref 3.5–5.2)
Alkaline Phosphatase: 68 U/L (ref 39–117)
BILIRUBIN TOTAL: 0.7 mg/dL (ref 0.2–1.2)
BUN: 15 mg/dL (ref 6–23)
CALCIUM: 9.2 mg/dL (ref 8.4–10.5)
CO2: 27 mEq/L (ref 19–32)
CREATININE: 1.3 mg/dL (ref 0.4–1.5)
Chloride: 103 mEq/L (ref 96–112)
GFR: 73.74 mL/min (ref >60.00)
Glucose, Bld: 130 mg/dL — ABNORMAL HIGH (ref 70–99)
Potassium: 4.2 mEq/L (ref 3.5–5.1)
Sodium: 136 mEq/L (ref 135–145)
Total Protein: 7.5 g/dL (ref 6.0–8.3)

## 2014-05-25 LAB — LIPID PANEL
CHOL/HDL RATIO: 4
Cholesterol: 112 mg/dL (ref 0–200)
HDL: 30.8 mg/dL — AB (ref >39.00)
LDL Cholesterol: 63 mg/dL (ref 0–99)
NONHDL: 81.2
Triglycerides: 90 mg/dL (ref 0.0–149.0)
VLDL: 18 mg/dL (ref 0.0–40.0)

## 2014-05-25 LAB — HEMOGLOBIN A1C: HEMOGLOBIN A1C: 6.9 % — AB (ref 4.6–6.5)

## 2014-05-27 ENCOUNTER — Ambulatory Visit (INDEPENDENT_AMBULATORY_CARE_PROVIDER_SITE_OTHER): Payer: BC Managed Care – PPO | Admitting: Endocrinology

## 2014-05-27 ENCOUNTER — Telehealth: Payer: Self-pay | Admitting: *Deleted

## 2014-05-27 ENCOUNTER — Encounter: Payer: Self-pay | Admitting: Endocrinology

## 2014-05-27 VITALS — BP 130/85 | HR 94 | Temp 98.5°F | Ht 70.25 in | Wt 275.0 lb

## 2014-05-27 DIAGNOSIS — E291 Testicular hypofunction: Secondary | ICD-10-CM

## 2014-05-27 DIAGNOSIS — IMO0002 Reserved for concepts with insufficient information to code with codable children: Secondary | ICD-10-CM

## 2014-05-27 DIAGNOSIS — E785 Hyperlipidemia, unspecified: Secondary | ICD-10-CM

## 2014-05-27 DIAGNOSIS — I10 Essential (primary) hypertension: Secondary | ICD-10-CM

## 2014-05-27 DIAGNOSIS — E1165 Type 2 diabetes mellitus with hyperglycemia: Secondary | ICD-10-CM

## 2014-05-27 NOTE — Telephone Encounter (Signed)
Patient called back about his meter, he spoke with his [employer] who said if you wrote a letter explaining why he needs to continue on the Accu-chek and what the difference in his readings have been on the one touch they may be able to keep him on the Accu-check.  Insurance's preferred brand is One touch ultra or Verio. Please advise [employer] # [phone]

## 2014-05-27 NOTE — Patient Instructions (Signed)
Check coverage of Bayer Contour meter

## 2014-05-27 NOTE — Progress Notes (Signed)
Patient ID: Clifford Kramer, male   DOB: 1966/01/19, 48 y.o.   MRN: [id number]   Reason for Appointment : Followup for Type 2 Diabetes  History of Present Illness          Diagnosis: Type 2 diabetes mellitus, date of diagnosis: 2005     Background information: He was probably started on metformin at the time of diagnosis when his blood sugars were not very high About 5-6 years ago because of higher blood sugars he was also given glipizide  He appeared to have  progression of his diabetes with A1c going up to 8.1% in 01/2013; also had difficulty with losing weight before he was started on Victoza.  With this his blood sugars were significantly better His glipizide was reduced to 2.5 mg in 10/14 because of rare hypoglycemia   RECENT history:  He has taken 1.8 milligrams of Victoza since 7/15 He has lost a little weight and his fasting readings are overall better He does think that it helps him with satiety, previously was having difficulty controlling carbohydrates and portions A1c is now below 7% However his blood sugars are somewhat inconsistent He has seen a dietitian before; thinks that certain carbohydrates raise his blood sugar more Overall highest blood sugars are made in the evenings but will occasionally have high readings in the mornings also  Checking blood sugars regularly but his insurance now wants him to use the One Touch brand He has readings relatively higher with the One Touch meter, generally about 30 mg more than the Accu-Chek: Also on that day he had his labs his One Touch meter was getting a higher reading  Oral hypoglycemic drugs the patient is taking are: Metformin 2 g and glipizide ER 2.5 mg    Side effects from medications have been: None       Monitors blood glucose:  once or twice a day.         Glucometer: Accucheck      Blood Glucose readings from download:  PREMEAL  a.m.  Lunch  6-8 PM  Bedtime Overall  Glucose range:  106-206   98   87-162  137-203     Mean/median: 144     141    Hypoglycemia: None          LIFESTYLE: Meals: 3 meals per day.  eating out periodically His breakfast maybe the cereal or eggs and toast and meat Usually eating fruit and sandwich at lunch, usually a grilled meat at suppertime  Physical activity: exercise: 3/7 days a week, at gym or walking, 90 min          Dietician visit: 05/2013           Wt Readings from Last 3 Encounters:  05/27/14 275 lb (124.739 kg)  05/21/14 284 lb (128.822 kg)  03/08/14 281 lb (127.461 kg)   Lab Results  Component Value Date   HGBA1C 6.9* 05/25/2014   HGBA1C 7.4* 02/22/2014   HGBA1C 7.1* 01/25/2014   Lab Results  Component Value Date   MICROALBUR 2.7* 02/22/2014   LDLCALC 63 05/25/2014   CREATININE 1.3 05/25/2014    Retinal exam: Most recent: 1/14      Medication List       This list is accurate as of: 05/27/14 10:22 AM.  Always use your most recent med list.               allopurinol 300 MG tablet  Commonly known as:  ZYLOPRIM  TAKE 1  TABLET EVERY DAY     aspirin 81 MG tablet  Take 81 mg by mouth daily.     AXIRON 30 MG/ACT Soln  Generic drug:  Testosterone  Place onto the skin.     BD PEN NEEDLE NANO U/F 32G X 4 MM Misc  Generic drug:  Insulin Pen Needle  USE EVERY DAY     doxycycline 100 MG capsule  Commonly known as:  VIBRAMYCIN  Take 1 capsule (100 mg total) by mouth 2 (two) times daily.     esomeprazole 40 MG capsule  Commonly known as:  NEXIUM  Take 1 capsule (40 mg total) by mouth daily at 12 noon.     fluticasone 50 MCG/ACT nasal spray  Commonly known as:  FLONASE  PLACE 2 SPRAYS INTO THE NOSE DAILY.     glipiZIDE 2.5 MG 24 hr tablet  Commonly known as:  GLUCOTROL XL  Take 1 tablet (2.5 mg total) by mouth daily.     glucose blood test strip  Commonly known as:  ONETOUCH VERIO  Use as instructed to check blood sugar 2 times per day     indomethacin 25 MG capsule  Commonly known as:  INDOCIN  Take 1 capsule (25 mg total) by mouth  3 (three) times daily as needed (PRN for gout pain).     Liraglutide 18 MG/3ML Sopn  Commonly known as:  VICTOZA  Inject 1.8 mg daily     lisinopril-hydrochlorothiazide 20-25 MG per tablet  Commonly known as:  PRINZIDE,ZESTORETIC  TAKE 1 TABLET EVERY MORNING     metFORMIN 1000 MG tablet  Commonly known as:  GLUCOPHAGE  TAKE 1 TABLET BY MOUTH TWO TIMES A DAY     omeprazole 40 MG capsule  Commonly known as:  PRILOSEC     ONETOUCH DELICA LANCETS FINE Misc  Use to check blood sugar 2 times per day     ONETOUCH VERIO IQ SYSTEM W/DEVICE Kit  Use to check blood sugar 2 times daily     simvastatin 20 MG tablet  Commonly known as:  ZOCOR  Take 1 tablet (20 mg total) by mouth at bedtime.     vardenafil 20 MG tablet  Commonly known as:  LEVITRA  Take 1 tablet (20 mg total) by mouth daily as needed.        Allergies:  Allergies  Allergen Reactions  . Penicillins   . Sulfonamide Derivatives     REACTION: itching    Past Medical History  Diagnosis Date  . Diabetes mellitus   . Hypertension     Past Surgical History  Procedure Laterality Date  . Knee surgery      Family History  Problem Relation Age of Onset  . Diabetes Mother   . Diabetes Maternal Aunt   . Diabetes Maternal Grandmother   . Heart disease Neg Hx     Social History:  reports that he has never smoked. He does not have any smokeless tobacco history on file. He reports that he does not drink alcohol. His drug history is not on file.    Review of Systems      Hyperlipidemia: LDL has been controlled, baseline previously 109, has been tolerating simvastatin. Has a low HDL  Lab Results  Component Value Date   CHOL 112 05/25/2014   HDL 30.80* 05/25/2014   LDLCALC 63 05/25/2014   TRIG 90.0 05/25/2014   CHOLHDL 4 05/25/2014       He has had a history of erectile dysfunction and  uses Levitra as needed     He was put on testosterone supplements and 2011 when his level was mildly low around 300  but  no further evaluation done.  This is being monitored by the urologist. He did not report feeling subjectively better with taking the supplements.  No  testosterone levels available on current record and he is going to see the urologist soon  No symptoms of neuropathy   LABS:  Appointment on 05/25/2014  Component Date Value Ref Range Status  . Hemoglobin A1C 05/25/2014 6.9* 4.6 - 6.5 % Final   Glycemic Control Guidelines for People with Diabetes:Non Diabetic:  <6%Goal of Therapy: <7%Additional Action Suggested:  >8%   . Sodium 05/25/2014 136  135 - 145 mEq/L Final  . Potassium 05/25/2014 4.2  3.5 - 5.1 mEq/L Final  . Chloride 05/25/2014 103  96 - 112 mEq/L Final  . CO2 05/25/2014 27  19 - 32 mEq/L Final  . Glucose, Bld 05/25/2014 130* 70 - 99 mg/dL Final  . BUN 05/25/2014 15  6 - 23 mg/dL Final  . Creatinine, Ser 05/25/2014 1.3  0.4 - 1.5 mg/dL Final  . Total Bilirubin 05/25/2014 0.7  0.2 - 1.2 mg/dL Final  . Alkaline Phosphatase 05/25/2014 68  39 - 117 U/L Final  . AST 05/25/2014 23  0 - 37 U/L Final  . ALT 05/25/2014 30  0 - 53 U/L Final  . Total Protein 05/25/2014 7.5  6.0 - 8.3 g/dL Final  . Albumin 05/25/2014 3.5  3.5 - 5.2 g/dL Final  . Calcium 05/25/2014 9.2  8.4 - 10.5 mg/dL Final  . GFR 05/25/2014 73.74  >60.00 mL/min Final  . Cholesterol 05/25/2014 112  0 - 200 mg/dL Final   ATP III Classification       Desirable:  < 200 mg/dL               Borderline High:  200 - 239 mg/dL          High:  > = 240 mg/dL  . Triglycerides 05/25/2014 90.0  0.0 - 149.0 mg/dL Final   Normal:  <150 mg/dLBorderline High:  150 - 199 mg/dL  . HDL 05/25/2014 30.80* >39.00 mg/dL Final  . VLDL 05/25/2014 18.0  0.0 - 40.0 mg/dL Final  . LDL Cholesterol 05/25/2014 63  0 - 99 mg/dL Final  . Total CHOL/HDL Ratio 05/25/2014 4   Final                  Men          Women1/2 Average Risk     3.4          3.3Average Risk          5.0          4.42X Average Risk          9.6          7.13X Average Risk           15.0          11.0                      . NonHDL 05/25/2014 81.20   Final   NOTE:  Non-HDL goal should be 30 mg/dL higher than patient's LDL goal (i.e. LDL goal of < 70 mg/dL, would have non-HDL goal of < 100 mg/dL)     Physical Examination:  BP 130/85  Pulse 94  Temp(Src) 98.5 F (36.9 C) (Oral)  Ht 5' 10.25" (1.784 m)  Wt 275 lb (124.739 kg)  BMI 39.19 kg/m2  SpO2 97%     No ankle edema  Diabetic foot exam shows normal monofilament sensation in the toes and plantar surfaces, no skin lesions or ulcers on the feet and normal pedal pulses  ASSESSMENT/PLAN:   Diabetes type 2, fair control  He has variable blood sugar levels although his A1c is improved with increasing his Victoza He still has relatively high readings late at night and late morning although not clear which readings are postprandial since he has variable work schedule Although he has started exercising some this has been less than optimal Has lost only a little weight since she went up on his Victoza He does think that he can do better with portions of carbohydrates to prevent hyperglycemia He does need to continue low-dose glipizide, this cannot be increased since he has occasional readings below 90  Discussed consistent diet and exercise He will try to get insurance to cover and alternatives glucose monitor since his FreeStyle or contour since his One Touch meter now appears to be reading falsely high Discussed that when he has his colonoscopy he will skip the glipizide for 2 days then reduce metformin to half  HYPERLIPIDEMIA: Is well controlled with taking simvastatin 20 mg HDL is relatively low and will need to work harder on weight loss and exercise  HYPERTENSION: Controlled with regimen of Zestoretic once a day  Hypogonadism: He will request his urologist to send [place] records of his testosterone level  Counseling time over 50% of today's 25 minute visit  Clifford Kramer 05/27/2014, 10:22 AM

## 2014-06-01 ENCOUNTER — Encounter: Payer: Self-pay | Admitting: Endocrinology

## 2014-06-01 NOTE — Telephone Encounter (Signed)
done

## 2014-06-01 NOTE — Telephone Encounter (Signed)
faxed

## 2014-06-21 ENCOUNTER — Ambulatory Visit (AMBULATORY_SURGERY_CENTER): Payer: Self-pay | Admitting: *Deleted

## 2014-06-21 VITALS — Ht 71.0 in | Wt 282.0 lb

## 2014-06-21 DIAGNOSIS — Z1211 Encounter for screening for malignant neoplasm of colon: Secondary | ICD-10-CM

## 2014-06-21 MED ORDER — MOVIPREP 100 G PO SOLR: ORAL | Status: DC

## 2014-06-21 NOTE — Progress Notes (Signed)
Patient denies any allergies to eggs or soy. Patient denies any problems with anesthesia/sedation. Patient denies any oxygen use at home and does not take any diet/weight loss medications. EMMI education assisgned to patient on colonoscopy, this was explained and instructions given to patient. 

## 2014-07-05 ENCOUNTER — Ambulatory Visit (AMBULATORY_SURGERY_CENTER): Payer: BC Managed Care – PPO | Admitting: Internal Medicine

## 2014-07-05 ENCOUNTER — Encounter: Payer: Self-pay | Admitting: Internal Medicine

## 2014-07-05 VITALS — BP 137/92 | HR 66 | Temp 98.3°F | Resp 38 | Ht 71.0 in | Wt 282.0 lb

## 2014-07-05 DIAGNOSIS — D125 Benign neoplasm of sigmoid colon: Secondary | ICD-10-CM

## 2014-07-05 DIAGNOSIS — Z1211 Encounter for screening for malignant neoplasm of colon: Secondary | ICD-10-CM

## 2014-07-05 DIAGNOSIS — D123 Benign neoplasm of transverse colon: Secondary | ICD-10-CM

## 2014-07-05 MED ORDER — SODIUM CHLORIDE 0.9 % IV SOLN: 500.0000 mL | INTRAVENOUS | Status: DC

## 2014-07-05 NOTE — Progress Notes (Signed)
Report to PACU, RN, vss, BBS= Clear.  

## 2014-07-05 NOTE — Progress Notes (Signed)
Called to room to assist during endoscopic procedure.  Patient ID and intended procedure confirmed with present staff. Received instructions for my participation in the procedure from the performing physician.  

## 2014-07-05 NOTE — Patient Instructions (Signed)
Discharge instructions given. Handouts on polyps. Resume previous medications. YOU HAD AN ENDOSCOPIC PROCEDURE TODAY AT THE  ENDOSCOPY CENTER: Refer to the procedure report that was given to you for any specific questions about what was found during the examination.  If the procedure report does not answer your questions, please call your gastroenterologist to clarify.  If you requested that your care partner not be given the details of your procedure findings, then the procedure report has been included in a sealed envelope for you to review at your convenience later.  YOU SHOULD EXPECT: Some feelings of bloating in the abdomen. Passage of more gas than usual.  Walking can help get rid of the air that was put into your GI tract during the procedure and reduce the bloating. If you had a lower endoscopy (such as a colonoscopy or flexible sigmoidoscopy) you may notice spotting of blood in your stool or on the toilet paper. If you underwent a bowel prep for your procedure, then you may not have a normal bowel movement for a few days.  DIET: Your first meal following the procedure should be a light meal and then it is ok to progress to your normal diet.  A half-sandwich or bowl of soup is an example of a good first meal.  Heavy or fried foods are harder to digest and may make you feel nauseous or bloated.  Likewise meals heavy in dairy and vegetables can cause extra gas to form and this can also increase the bloating.  Drink plenty of fluids but you should avoid alcoholic beverages for 24 hours.  ACTIVITY: Your care partner should take you home directly after the procedure.  You should plan to take it easy, moving slowly for the rest of the day.  You can resume normal activity the day after the procedure however you should NOT DRIVE or use heavy machinery for 24 hours (because of the sedation medicines used during the test).    SYMPTOMS TO REPORT IMMEDIATELY: A gastroenterologist can be reached at any  hour.  During normal business hours, 8:30 AM to 5:00 PM Monday through Friday, call [phone].  After hours and on weekends, please call the GI answering service at [phone] who will take a message and have the physician on call contact you.   Following lower endoscopy (colonoscopy or flexible sigmoidoscopy):  Excessive amounts of blood in the stool  Significant tenderness or worsening of abdominal pains  Swelling of the abdomen that is new, acute  Fever of 100F or higher  FOLLOW UP: If any biopsies were taken you will be contacted by phone or by letter within the next 1-3 weeks.  Call your gastroenterologist if you have not heard about the biopsies in 3 weeks.  Our staff will call the home number listed on your records the next business day following your procedure to check on you and address any questions or concerns that you may have at that time regarding the information given to you following your procedure. This is a courtesy call and so if there is no answer at the home number and we have not heard from you through the emergency physician on call, we will assume that you have returned to your regular daily activities without incident.  SIGNATURES/CONFIDENTIALITY: You and/or your care partner have signed paperwork which will be entered into your electronic medical record.  These signatures attest to the fact that that the information above on your After Visit Summary has been reviewed and is   understood.  Full responsibility of the confidentiality of this discharge information lies with you and/or your care-partner. 

## 2014-07-06 ENCOUNTER — Telehealth: Payer: Self-pay

## 2014-07-06 NOTE — Telephone Encounter (Signed)
  Follow up Call-  Call back number 07/05/2014  Post procedure Call Back phone  # [phone] hm  Permission to leave phone message Yes     Patient questions:  Do you have a fever, pain , or abdominal swelling? No. Pain Score  0 *  Have you tolerated food without any problems? Yes.    Have you been able to return to your normal activities? Yes.    Do you have any questions about your discharge instructions: Diet   No. Medications  No. Follow up visit  No.  Do you have questions or concerns about your Care? No.  Actions: * If pain score is 4 or above: No action needed, pain <4.

## 2014-07-07 NOTE — Op Note (Signed)
[facility]  [address]. [place], [address]   COLONOSCOPY PROCEDURE REPORT  PATIENT: Chigozie, Basaldua  MR#: [id number] BIRTHDATE: [birth year] , 48  yrs. old GENDER: male ENDOSCOPIST: Jerene Bears, MD REFERRED TO:[id number] Delora Fuel, M.D. PROCEDURE DATE:  07/05/2014 PROCEDURE:   Colonoscopy with cold biopsy polypectomy and Colonoscopy with snare polypectomy First Screening Colonoscopy - Avg.  risk and is 50 yrs.  old or older Yes.  Prior Negative Screening - Now for repeat screening. N/A  History of Adenoma - Now for follow-up colonoscopy & has been > or = to 3 yrs.  N/A  Polyps Removed Today? Yes. ASA CLASS:   Class II INDICATIONS:average risk for colorectal cancer and first colonoscopy. MEDICATIONS: Monitored anesthesia care and Propofol 300 mg IV  DESCRIPTION OF PROCEDURE:   After the risks benefits and alternatives of the procedure were thoroughly explained, informed consent was obtained.  The digital rectal exam revealed no rectal mass.   The [id number]  endoscope was introduced through the anus and advanced to the cecum, which was identified by both the appendix and ileocecal valve. No adverse events experienced. The quality of the prep was good, using MoviPrep  The instrument was then slowly withdrawn as the colon was fully examined.   COLON FINDINGS: Four sessile polyps ranging between 3-24mm in size were found in the transverse colon (2) and sigmoid colon (2). Polypectomies were performed with a cold snare (2) and with cold forceps (2).  The resection was complete, the polyp tissue was completely retrieved and sent to histology.  Retroflexed views revealed internal hemorrhoids. The time to cecum=2 minutes 34 seconds.  Withdrawal time=17 minutes 36 seconds.  The scope was withdrawn and the procedure completed.  COMPLICATIONS: There were no immediate complications.  ENDOSCOPIC IMPRESSION: Four sessile polyps ranging between 3-63mm in size were  found in the transverse colon and sigmoid colon; polypectomies were performed with a cold snare and with cold forceps   RECOMMENDATIONS: 1.  Await pathology results 2.  Timing of repeat colonoscopy will be determined by pathology findings. 3.  You will receive a letter within 1-2 weeks with the results of your biopsy as well as final recommendations.  Please call my office if you have not received a letter after 3 weeks.  eSigned:  Jerene Bears, MD 07/05/2014 9:55 AM   cc: Dorena Cookey, MD and The Patient   PATIENT NAME:  Gyan, Cambre MR#: [id number]

## 2014-07-09 ENCOUNTER — Encounter: Payer: Self-pay | Admitting: Internal Medicine

## 2014-07-19 ENCOUNTER — Other Ambulatory Visit: Payer: Self-pay | Admitting: Endocrinology

## 2014-08-13 ENCOUNTER — Other Ambulatory Visit: Payer: Self-pay | Admitting: Endocrinology

## 2014-08-16 ENCOUNTER — Other Ambulatory Visit: Payer: Self-pay | Admitting: Family Medicine

## 2014-08-16 ENCOUNTER — Telehealth: Payer: Self-pay | Admitting: Endocrinology

## 2014-08-16 ENCOUNTER — Other Ambulatory Visit: Payer: Self-pay | Admitting: Endocrinology

## 2014-08-16 NOTE — Telephone Encounter (Signed)
victoza needs to be a 90 day supply

## 2014-08-24 ENCOUNTER — Other Ambulatory Visit: Payer: BC Managed Care – PPO

## 2014-08-25 ENCOUNTER — Other Ambulatory Visit (INDEPENDENT_AMBULATORY_CARE_PROVIDER_SITE_OTHER): Payer: BLUE CROSS/BLUE SHIELD

## 2014-08-25 DIAGNOSIS — E1165 Type 2 diabetes mellitus with hyperglycemia: Secondary | ICD-10-CM

## 2014-08-25 DIAGNOSIS — IMO0002 Reserved for concepts with insufficient information to code with codable children: Secondary | ICD-10-CM

## 2014-08-25 LAB — COMPREHENSIVE METABOLIC PANEL
ALBUMIN: 4 g/dL (ref 3.5–5.2)
ALT: 34 U/L (ref 0–53)
AST: 26 U/L (ref 0–37)
Alkaline Phosphatase: 65 U/L (ref 39–117)
BUN: 15 mg/dL (ref 6–23)
CHLORIDE: 100 meq/L (ref 96–112)
CO2: 28 meq/L (ref 19–32)
CREATININE: 1.2 mg/dL (ref 0.40–1.50)
Calcium: 9 mg/dL (ref 8.4–10.5)
GFR: 82.23 mL/min (ref >60.00)
Glucose, Bld: 139 mg/dL — ABNORMAL HIGH (ref 70–99)
POTASSIUM: 4 meq/L (ref 3.5–5.1)
Sodium: 134 mEq/L — ABNORMAL LOW (ref 135–145)
TOTAL PROTEIN: 7.2 g/dL (ref 6.0–8.3)
Total Bilirubin: 0.6 mg/dL (ref 0.2–1.2)

## 2014-08-25 LAB — MICROALBUMIN / CREATININE URINE RATIO
Creatinine,U: 277.9 mg/dL
Microalb Creat Ratio: 0.5 mg/g (ref 0.0–30.0)
Microalb, Ur: 1.4 mg/dL (ref 0.0–1.9)

## 2014-08-25 LAB — HEMOGLOBIN A1C: HEMOGLOBIN A1C: 7.4 % — AB (ref 4.6–6.5)

## 2014-08-27 ENCOUNTER — Ambulatory Visit (INDEPENDENT_AMBULATORY_CARE_PROVIDER_SITE_OTHER): Payer: BLUE CROSS/BLUE SHIELD | Admitting: Endocrinology

## 2014-08-27 ENCOUNTER — Encounter: Payer: Self-pay | Admitting: Endocrinology

## 2014-08-27 ENCOUNTER — Other Ambulatory Visit: Payer: Self-pay | Admitting: *Deleted

## 2014-08-27 VITALS — BP 161/94 | HR 85 | Temp 98.3°F | Resp 14 | Ht 70.5 in | Wt 283.6 lb

## 2014-08-27 DIAGNOSIS — E1165 Type 2 diabetes mellitus with hyperglycemia: Secondary | ICD-10-CM

## 2014-08-27 DIAGNOSIS — IMO0002 Reserved for concepts with insufficient information to code with codable children: Secondary | ICD-10-CM

## 2014-08-27 DIAGNOSIS — I10 Essential (primary) hypertension: Secondary | ICD-10-CM

## 2014-08-27 MED ORDER — GLIMEPIRIDE 2 MG PO TABS: 2.0000 mg | ORAL_TABLET | Freq: Every day | ORAL | Status: DC

## 2014-08-27 MED ORDER — BAYER MICROLET LANCETS MISC: Status: DC

## 2014-08-27 MED ORDER — GLUCOSE BLOOD VI STRP: ORAL_STRIP | Status: DC

## 2014-08-27 NOTE — Progress Notes (Signed)
Patient ID: Clifford Kramer, male   DOB: [birth year], 49 y.o.   MRN: [id number]   Reason for Appointment : Followup for Type 2 Diabetes  History of Present Illness          Diagnosis: Type 2 diabetes mellitus, date of diagnosis: 2005     Background information: He was probably started on metformin at the time of diagnosis when his blood sugars were not very high About 5-6 years ago because of higher blood sugars he was also given glipizide  He appeared to have  progression of his diabetes with A1c going up to 8.1% in 01/2013; also had difficulty with losing weight before he was started on Victoza.  With this his blood sugars were significantly better His glipizide was reduced to 2.5 mg in 10/14 because of rare hypoglycemia   RECENT history:  Antihyperglycemic drugs the patient is taking are: Victoza 1.8 mg, Metformin 2 g and glipizide ER 2.5 mg   He has taken 1.8 mg of Victoza since 7/15 Although his A1c was below 7% on the last visit it has gone up this time He has not brought his monitor for download but his blood sugars are appearing higher postprandially from recall Currently not able to lose weight even though he still trying to exercise He previously had stated that Victoza helps him with satiety, otherwise was having difficulty controlling carbohydrates and portions. He checks his blood sugars when he comes back from work in the morning and these are relatively high; also relatively high after supper but usually fairly good when he wakes up in the afternoon He is eating mostly fruits during his work hours at night and no meal or other protein He has seen a dietitian before; thinks that certain carbohydrates raise his blood sugar more Overall highest blood sugars are made in the evenings but will occasionally have high readings in the mornings also He again thinks his blood sugars are relatively higher with the One Touch meter compared to Accu-Chek   Side effects from medications have  been: None       Monitors blood glucose:  once or twice a day.         Glucometer: One Touch    Blood Glucose readings   PRE-MEAL Breakfast Lunch Dinner hs Overall  Glucose range:        Average: 130   170    Hypoglycemia: None          LIFESTYLE: Meals: 3 meals per day.  eating out periodically His breakfast maybe the cereal or eggs and toast and meat  Usually eating fruit and sandwich at lunch, usually a grilled meat at suppertime  Physical activity: exercise: 3-4/7 days a week, at gym or walking, 90 min          Dietician visit: 05/2013           Wt Readings from Last 3 Encounters:  08/27/14 283 lb 9.6 oz (128.64 kg)  07/05/14 282 lb (127.914 kg)  06/21/14 282 lb (127.914 kg)   Lab Results  Component Value Date   HGBA1C 7.4* 08/25/2014   HGBA1C 6.9* 05/25/2014   HGBA1C 7.4* 02/22/2014   Lab Results  Component Value Date   MICROALBUR 1.4 08/25/2014   LDLCALC 63 05/25/2014   CREATININE 1.20 08/25/2014    Retinal exam: Most recent: 1/14      Medication List       This list is accurate as of: 08/27/14 10:41 AM.  Always use your most  recent med list.               allopurinol 300 MG tablet  Commonly known as:  ZYLOPRIM  TAKE 1 TABLET EVERY DAY     aspirin 81 MG tablet  Take 81 mg by mouth daily.     AXIRON 30 MG/ACT Soln  Generic drug:  Testosterone  Place onto the skin.     BD PEN NEEDLE NANO U/F 32G X 4 MM Misc  Generic drug:  Insulin Pen Needle  USE EVERY DAY     esomeprazole 40 MG capsule  Commonly known as:  NEXIUM  Take 1 capsule (40 mg total) by mouth daily at 12 noon.     fluticasone 50 MCG/ACT nasal spray  Commonly known as:  FLONASE  PLACE 2 SPRAYS INTO THE NOSE DAILY.     glipiZIDE 2.5 MG 24 hr tablet  Commonly known as:  GLUCOTROL XL  TAKE 1 TABLET (2.5 MG TOTAL) BY MOUTH DAILY.     glucose blood test strip  Commonly known as:  ONETOUCH VERIO  Use as instructed to check blood sugar 2 times per day     indomethacin 25 MG capsule   Commonly known as:  INDOCIN  Take 1 capsule (25 mg total) by mouth 3 (three) times daily as needed (PRN for gout pain).     lisinopril-hydrochlorothiazide 20-25 MG per tablet  Commonly known as:  PRINZIDE,ZESTORETIC  TAKE 1 TABLET EVERY MORNING     metFORMIN 1000 MG tablet  Commonly known as:  GLUCOPHAGE  TAKE 1 TABLET BY MOUTH TWO TIMES A DAY     ONETOUCH DELICA LANCETS FINE Misc  Use to check blood sugar 2 times per day     ONETOUCH VERIO IQ SYSTEM W/DEVICE Kit  Use to check blood sugar 2 times daily     simvastatin 20 MG tablet  Commonly known as:  ZOCOR  Take 1 tablet (20 mg total) by mouth at bedtime.     vardenafil 20 MG tablet  Commonly known as:  LEVITRA  Take 1 tablet (20 mg total) by mouth daily as needed.     VIAGRA 100 MG tablet  Generic drug:  sildenafil     VICTOZA 18 MG/3ML Sopn  Generic drug:  Liraglutide  INJECT 1.8 MILLIGRAMS UNDER SKIN DAILY        Allergies:  Allergies  Allergen Reactions  . Celebrex [Celecoxib] Itching    REACTION: itching  . Penicillins Itching  . Sulfonamide Derivatives Itching    REACTION: itching    Past Medical History  Diagnosis Date  . Diabetes mellitus   . Hypertension   . Hyperlipidemia   . Gout     Past Surgical History  Procedure Laterality Date  . Knee surgery Left 2005    Family History  Problem Relation Age of Onset  . Diabetes Mother   . Diabetes Maternal Aunt   . Diabetes Maternal Grandmother   . Heart disease Neg Hx   . Colon cancer Neg Hx   . Esophageal cancer Neg Hx   . Rectal cancer Neg Hx   . Stomach cancer Other     Social History:  reports that he has never smoked. He has never used smokeless tobacco. He reports that he drinks alcohol. He reports that he does not use illicit drugs.    Review of Systems   HYPERTENSION: He is followed by his PCP.  Has not had a recent visit.  Has not taken his medication this morning,  currently only on Zestoretic     Hyperlipidemia: LDL has been  controlled, baseline previously 109, has been tolerating simvastatin. Has a low HDL  Lab Results  Component Value Date   CHOL 112 05/25/2014   HDL 30.80* 05/25/2014   LDLCALC 63 05/25/2014   TRIG 90.0 05/25/2014   CHOLHDL 4 05/25/2014       He has had a history of erectile dysfunction and uses Levitra as needed     He was started on testosterone supplements since 2011 when his level was mildly low around 300  but no further evaluation done.  This is being monitored by the urologist. He did not report feeling subjectively better with taking the supplements.  No  testosterone levels available on current record    Urine microalbumin normal in 1/16  LABS:  Appointment on 08/25/2014  Component Date Value Ref Range Status  . Hgb A1c MFr Bld 08/25/2014 7.4* 4.6 - 6.5 % Final   Glycemic Control Guidelines for People with Diabetes:Non Diabetic:  <6%Goal of Therapy: <7%Additional Action Suggested:  >8%   . Sodium 08/25/2014 134* 135 - 145 mEq/L Final  . Potassium 08/25/2014 4.0  3.5 - 5.1 mEq/L Final  . Chloride 08/25/2014 100  96 - 112 mEq/L Final  . CO2 08/25/2014 28  19 - 32 mEq/L Final  . Glucose, Bld 08/25/2014 139* 70 - 99 mg/dL Final  . BUN 08/25/2014 15  6 - 23 mg/dL Final  . Creatinine, Ser 08/25/2014 1.20  0.40 - 1.50 mg/dL Final  . Total Bilirubin 08/25/2014 0.6  0.2 - 1.2 mg/dL Final  . Alkaline Phosphatase 08/25/2014 65  39 - 117 U/L Final  . AST 08/25/2014 26  0 - 37 U/L Final  . ALT 08/25/2014 34  0 - 53 U/L Final  . Total Protein 08/25/2014 7.2  6.0 - 8.3 g/dL Final  . Albumin 08/25/2014 4.0  3.5 - 5.2 g/dL Final  . Calcium 08/25/2014 9.0  8.4 - 10.5 mg/dL Final  . GFR 08/25/2014 82.23  >60.00 mL/min Final  . Microalb, Ur 08/25/2014 1.4  0.0 - 1.9 mg/dL Final  . Creatinine,U 08/25/2014 277.9   Final  . Microalb Creat Ratio 08/25/2014 0.5  0.0 - 30.0 mg/g Final     Physical Examination:  BP 161/94 mmHg  Pulse 85  Temp(Src) 98.3 F (36.8 C)  Resp 14  Ht 5'  10.5" (1.791 m)  Wt 283 lb 9.6 oz (128.64 kg)  BMI 40.10 kg/m2  SpO2 99%     No ankle edema  Repeat blood pressure 138/90  ASSESSMENT/PLAN:   Diabetes type 2, fair control  His blood sugar control appears to be somewhat worse with A1c going up over the winter months probably because of inconsistent diet and exercise The blood sugars appear to be relatively higher after meals and evenings and overnight when he is working Also has difficulty losing weight   Recommended that he try Amaryl 2 mg at suppertime instead of glipizide ER He can reduce his fruit intake at work and have more balanced snacks with protein Would consider adding Invokana if blood sugars are not well-controlled and if weight loss continues to be difficult  HYPERTENSION:  His blood pressure is relatively high with regimen of Zestoretic once a day, this may be partly related to his not taking his medication this morning. He will need to follow with his PCP   [facility] 08/27/2014, 10:41 AM

## 2014-08-27 NOTE — Patient Instructions (Signed)
Change Glipizide to Glimeperide 2mg  at supper  May use 1/2 if sugar getting <80  Please check blood sugars at least half the time about 2 hours after any meal and 3 times per week on waking up. Please bring blood sugar monitor to each visit. Recommended blood sugar levels about 2 hours after meal is 140-180 and on waking up 90-130

## 2014-10-18 ENCOUNTER — Other Ambulatory Visit: Payer: Self-pay | Admitting: *Deleted

## 2014-10-18 MED ORDER — GLIMEPIRIDE 2 MG PO TABS: 2.0000 mg | ORAL_TABLET | Freq: Every day | ORAL | Status: DC

## 2014-11-22 ENCOUNTER — Other Ambulatory Visit (INDEPENDENT_AMBULATORY_CARE_PROVIDER_SITE_OTHER): Payer: BLUE CROSS/BLUE SHIELD

## 2014-11-22 DIAGNOSIS — E1165 Type 2 diabetes mellitus with hyperglycemia: Secondary | ICD-10-CM

## 2014-11-22 DIAGNOSIS — IMO0002 Reserved for concepts with insufficient information to code with codable children: Secondary | ICD-10-CM

## 2014-11-22 LAB — COMPREHENSIVE METABOLIC PANEL
ALT: 32 U/L (ref 0–53)
AST: 23 U/L (ref 0–37)
Albumin: 4.2 g/dL (ref 3.5–5.2)
Alkaline Phosphatase: 74 U/L (ref 39–117)
BUN: 16 mg/dL (ref 6–23)
CHLORIDE: 103 meq/L (ref 96–112)
CO2: 27 meq/L (ref 19–32)
CREATININE: 1.24 mg/dL (ref 0.40–1.50)
Calcium: 9.6 mg/dL (ref 8.4–10.5)
GFR: 79.1 mL/min (ref >60.00)
Glucose, Bld: 127 mg/dL — ABNORMAL HIGH (ref 70–99)
POTASSIUM: 4.2 meq/L (ref 3.5–5.1)
Sodium: 136 mEq/L (ref 135–145)
TOTAL PROTEIN: 7.5 g/dL (ref 6.0–8.3)
Total Bilirubin: 0.5 mg/dL (ref 0.2–1.2)

## 2014-11-22 LAB — HEMOGLOBIN A1C: HEMOGLOBIN A1C: 6.7 % — AB (ref 4.6–6.5)

## 2014-11-23 ENCOUNTER — Other Ambulatory Visit: Payer: BLUE CROSS/BLUE SHIELD

## 2014-11-26 ENCOUNTER — Encounter: Payer: Self-pay | Admitting: Endocrinology

## 2014-11-26 ENCOUNTER — Ambulatory Visit (INDEPENDENT_AMBULATORY_CARE_PROVIDER_SITE_OTHER): Payer: BLUE CROSS/BLUE SHIELD | Admitting: Endocrinology

## 2014-11-26 VITALS — BP 130/88 | HR 87 | Temp 98.3°F | Resp 16 | Ht 70.5 in | Wt 280.0 lb

## 2014-11-26 DIAGNOSIS — I10 Essential (primary) hypertension: Secondary | ICD-10-CM

## 2014-11-26 DIAGNOSIS — IMO0002 Reserved for concepts with insufficient information to code with codable children: Secondary | ICD-10-CM

## 2014-11-26 DIAGNOSIS — E1165 Type 2 diabetes mellitus with hyperglycemia: Secondary | ICD-10-CM | POA: Diagnosis not present

## 2014-11-26 MED ORDER — AMLODIPINE BESYLATE 5 MG PO TABS: 5.0000 mg | ORAL_TABLET | Freq: Every day | ORAL | Status: DC

## 2014-11-26 NOTE — Progress Notes (Signed)
Patient ID: Clifford Kramer, male   DOB: 08-05-1965, 49 y.o.   MRN: [id number]   Reason for Appointment : Followup for Type 2 Diabetes  History of Present Illness          Diagnosis: Type 2 diabetes mellitus, date of diagnosis: 2005     Background information: He was probably started on metformin at the time of diagnosis when his blood sugars were not very high About 5-6 years ago because of higher blood sugars he was also given glipizide  He appeared to have  progression of his diabetes with A1c going up to 8.1% in 01/2013; also had difficulty with losing weight before he was started on Victoza.  With this his blood sugars were significantly better His glipizide was reduced to 2.5 mg in 10/14 because of rare hypoglycemia He has taken 1.8 mg of Victoza since 7/15   RECENT history:  Antihyperglycemic drugs the patient is taking are: Victoza 1.8 mg, Metformin 2 g and Amaryl 2 mg    Although his A1c is now 6.7 which is better than usual his blood sugars at home are quite variable and averaging almost 170 He is using the One Touch Verio meter and he still thinks that it is giving him high readings Current blood sugar patterns and problems:  He has readings over 200 frequently after meals including this morning.  Sometimes will check a sugar about an hour after eating.  Most of his blood sugars are being checked in the morning hours since he is working at night and not clear which readings are before meals or after  He has occasional feelings of hypoglycemia but lowest recorded blood sugar is only 76; he was feeling symptoms of hypoglycemia when his meter was giving him a reading of about 125  His lab glucose was about 40 mg lower than his fingerstick reading.  He says that he is usually keeping his hands clean before checking and is not using any expired test strips.  Not clear if his blood sugars are improved with taking Amaryl instead of glipizide ER; previously was still having some high  postprandial blood sugars in the evenings and overnight  Again he is having difficulty losing weight even though he thinks he is generally watching his diet and continuing Victoza as well as exercising   Side effects from medications have been: None       Monitors blood glucose:  once or twice a day.         Glucometer: One Touch Verio     Blood Glucose readings   PRE-MEAL  12 AM-6 AM   6 AM-9 AM   9 AM-11 AM   5 PM-7 PM  Overall  Glucose range:  131-336   102-254   102-189   92-247   76-336   Median:  178   171   156    158    Hypoglycemia: None          LIFESTYLE: Meals: 3 meals per day.  eating out periodically His breakfast maybe the cereal or eggs and toast and meat  Usually eating fruit and sandwich at lunch, usually a grilled meat at suppertime  Physical activity: exercise: 4/7 days a week, at gym or walking, 90 min          Dietician visit: 05/2013           Wt Readings from Last 3 Encounters:  11/26/14 280 lb (127.007 kg)  08/27/14 283 lb 9.6 oz (128.64  kg)  07/05/14 282 lb (127.914 kg)   Lab Results  Component Value Date   HGBA1C 6.7* 11/22/2014   HGBA1C 7.4* 08/25/2014   HGBA1C 6.9* 05/25/2014   Lab Results  Component Value Date   MICROALBUR 1.4 08/25/2014   LDLCALC 63 05/25/2014   CREATININE 1.24 11/22/2014    Retinal exam: Most recent: 1/14      Medication List       This list is accurate as of: 11/26/14 10:18 AM.  Always use your most recent med list.               allopurinol 300 MG tablet  Commonly known as:  ZYLOPRIM  TAKE 1 TABLET EVERY DAY     aspirin 81 MG tablet  Take 81 mg by mouth daily.     AXIRON 30 MG/ACT Soln  Generic drug:  Testosterone  Place onto the skin.     BD PEN NEEDLE NANO U/F 32G X 4 MM Misc  Generic drug:  Insulin Pen Needle  USE EVERY DAY     esomeprazole 40 MG capsule  Commonly known as:  NEXIUM  Take 1 capsule (40 mg total) by mouth daily at 12 noon.     fluticasone 50 MCG/ACT nasal spray  Commonly known  as:  FLONASE  PLACE 2 SPRAYS INTO THE NOSE DAILY.     glimepiride 2 MG tablet  Commonly known as:  AMARYL  Take 1 tablet (2 mg total) by mouth daily before supper.     glucose blood test strip  Commonly known as:  ONETOUCH VERIO  Use as instructed to check blood sugar 2 times per day     glucose blood test strip  Commonly known as:  BAYER CONTOUR NEXT TEST  Use as instructed to check blood sugar once a day dx code E11.9     indomethacin 25 MG capsule  Commonly known as:  INDOCIN  Take 1 capsule (25 mg total) by mouth 3 (three) times daily as needed (PRN for gout pain).     lisinopril-hydrochlorothiazide 20-25 MG per tablet  Commonly known as:  PRINZIDE,ZESTORETIC  TAKE 1 TABLET EVERY MORNING     metFORMIN 1000 MG tablet  Commonly known as:  GLUCOPHAGE  TAKE 1 TABLET BY MOUTH TWO TIMES A DAY     ONETOUCH DELICA LANCETS FINE Misc  Use to check blood sugar 2 times per day     BAYER MICROLET LANCETS lancets  Use as instructed to check blood sugar once a day dx code E11.9     ONETOUCH VERIO IQ SYSTEM W/DEVICE Kit  Use to check blood sugar 2 times daily     simvastatin 20 MG tablet  Commonly known as:  ZOCOR  Take 1 tablet (20 mg total) by mouth at bedtime.     vardenafil 20 MG tablet  Commonly known as:  LEVITRA  Take 1 tablet (20 mg total) by mouth daily as needed.     VIAGRA 100 MG tablet  Generic drug:  sildenafil     VICTOZA 18 MG/3ML Sopn  Generic drug:  Liraglutide  INJECT 1.8 MILLIGRAMS UNDER SKIN DAILY        Allergies:  Allergies  Allergen Reactions  . Celebrex [Celecoxib] Itching    REACTION: itching  . Penicillins Itching  . Sulfonamide Derivatives Itching    REACTION: itching    Past Medical History  Diagnosis Date  . Diabetes mellitus   . Hypertension   . Hyperlipidemia   . Gout       Past Surgical History  Procedure Laterality Date  . Knee surgery Left 2005    Family History  Problem Relation Age of Onset  . Diabetes Mother   .  Diabetes Maternal Aunt   . Diabetes Maternal Grandmother   . Heart disease Neg Hx   . Colon cancer Neg Hx   . Esophageal cancer Neg Hx   . Rectal cancer Neg Hx   . Stomach cancer Other     Social History:  reports that he has never smoked. He has never used smokeless tobacco. He reports that he drinks alcohol. He reports that he does not use illicit drugs.    Review of Systems   HYPERTENSION: Does not appear to be well controlled, blood pressure was high on this previous visit also He is followed by his PCP.  Has not had a recent visit.  He is currently only on Zestoretic No home testing     Hyperlipidemia: LDL has been controlled, baseline previously 109, has been tolerating simvastatin. Has a low HDL  Lab Results  Component Value Date   CHOL 112 05/25/2014   HDL 30.80* 05/25/2014   LDLCALC 63 05/25/2014   TRIG 90.0 05/25/2014   CHOLHDL 4 05/25/2014       He has had a history of erectile dysfunction and uses Levitra as needed     He has been on testosterone supplements since 2011 when his level was mildly low around 300  but no further evaluation done.  This is being monitored by the urologist. He did not report feeling subjectively better with taking the supplements.  No  testosterone levels available on current record    Urine microalbumin normal in 1/16  LABS:  Lab on 11/22/2014  Component Date Value Ref Range Status  . Hgb A1c MFr Bld 11/22/2014 6.7* 4.6 - 6.5 % Final   Glycemic Control Guidelines for People with Diabetes:Non Diabetic:  <6%Goal of Therapy: <7%Additional Action Suggested:  >8%   . Sodium 11/22/2014 136  135 - 145 mEq/L Final  . Potassium 11/22/2014 4.2  3.5 - 5.1 mEq/L Final  . Chloride 11/22/2014 103  96 - 112 mEq/L Final  . CO2 11/22/2014 27  19 - 32 mEq/L Final  . Glucose, Bld 11/22/2014 127* 70 - 99 mg/dL Final  . BUN 11/22/2014 16  6 - 23 mg/dL Final  . Creatinine, Ser 11/22/2014 1.24  0.40 - 1.50 mg/dL Final  . Total Bilirubin 11/22/2014  0.5  0.2 - 1.2 mg/dL Final  . Alkaline Phosphatase 11/22/2014 74  39 - 117 U/L Final  . AST 11/22/2014 23  0 - 37 U/L Final  . ALT 11/22/2014 32  0 - 53 U/L Final  . Total Protein 11/22/2014 7.5  6.0 - 8.3 g/dL Final  . Albumin 11/22/2014 4.2  3.5 - 5.2 g/dL Final  . Calcium 11/22/2014 9.6  8.4 - 10.5 mg/dL Final  . GFR 11/22/2014 79.10  >60.00 mL/min Final     Physical Examination:  BP 144/96 mmHg  Pulse 87  Temp(Src) 98.3 F (36.8 C)  Resp 16  Ht 5' 10.5" (1.791 m)  Wt 280 lb (127.007 kg)  BMI 39.59 kg/m2  SpO2 99%     No ankle edema  Repeat blood pressure 130/88 both arms    ASSESSMENT/PLAN:   Diabetes type 2, fair control  His blood sugar control appears to be somewhat better as judged by his A1c but he still has tendency to high postprandial blood sugars when he checks them after   her evening meal or after his early morning meal after work Not clear if he has benefited from switching from glipizide to Amaryl which he is taking at suppertime Also difficult to know if his glucose monitor is reading falsely high since blood sugar in the lab was also significantly lower than his home reading the same morning  Since he has occasional feelings of hypoglycemia will not increase the dose of Amaryl as yet He does need to continue moderating his diet to help with postprandial hyperglycemia Again discussed possibility of needing a dietitian to  see if he can improve his meal planning and limitation of high glycemic index foods  Discussed timing of glucose monitoring and he needs to do it 2 hours after eating rather than 1 hour For now will have him try a new glucose monitor, sample given  Would consider adding Invokana or Jardiance if blood sugars are not well-controlled and if weight loss continues to be difficult  HYPERTENSION:  His blood pressure is relatively high with regimen of Zestoretic 20/25 Will add amlodipine 5 mg He will need to follow with his PCP  Counseling  time over 50% of today's 25 minute visit  Vinh Sachs 11/26/2014, 10:18 AM

## 2014-11-26 NOTE — Patient Instructions (Signed)
Add amlodipine for BP  Please check blood sugars at least half the time about 2 hours after any meal and 3 times per week on waking up. Please bring blood sugar monitor to each visit. Recommended blood sugar levels about 2 hours after meal is 140-180 and on waking up 90-130

## 2015-01-13 ENCOUNTER — Other Ambulatory Visit: Payer: Self-pay | Admitting: *Deleted

## 2015-01-13 MED ORDER — AMLODIPINE BESYLATE 5 MG PO TABS: 5.0000 mg | ORAL_TABLET | Freq: Every day | ORAL | Status: DC

## 2015-01-20 ENCOUNTER — Encounter: Payer: Self-pay | Admitting: *Deleted

## 2015-01-20 ENCOUNTER — Emergency Department
Admission: EM | Admit: 2015-01-20 | Discharge: 2015-01-20 | Disposition: A | Discharge status: Home / Self Care | Payer: BLUE CROSS/BLUE SHIELD | Source: Home / Self Care | Attending: Emergency Medicine | Admitting: Emergency Medicine

## 2015-01-20 DIAGNOSIS — B350 Tinea barbae and tinea capitis: Secondary | ICD-10-CM

## 2015-01-20 DIAGNOSIS — L738 Other specified follicular disorders: Secondary | ICD-10-CM

## 2015-01-20 MED ORDER — DOXYCYCLINE HYCLATE 100 MG PO CAPS: 100.0000 mg | ORAL_CAPSULE | Freq: Two times a day (BID) | ORAL | Status: DC

## 2015-01-20 NOTE — ED Provider Notes (Signed)
CSN: [id number]     Arrival date & time 01/20/15  0857 History   First MD Initiated Contact with Patient 01/20/15 [phone]     Chief Complaint  Patient presents with  . Abscess   (Consider location/radiation/quality/duration/timing/severity/associated sxs/prior Treatment) HPI 4 weeks ago, noticed a hair bump/pimple left side of face. Mildly painful at first. He squeezed it several times over the past 2 weeks, got out slight amount of yellow pus. Then, over the past 2 days, the soreness has increased. No drainage or bleeding today. No ear pain or nose symptoms or sore throat or neck pain. No fever or chills or nausea or vomiting. He states his diabetes is well-controlled. Past Medical History  Diagnosis Date  . Diabetes mellitus   . Hypertension   . Hyperlipidemia   . Gout    Past Surgical History  Procedure Laterality Date  . Knee surgery Left 2005   Family History  Problem Relation Age of Onset  . Diabetes Mother   . Diabetes Maternal Aunt   . Diabetes Maternal Grandmother   . Heart disease Neg Hx   . Colon cancer Neg Hx   . Esophageal cancer Neg Hx   . Rectal cancer Neg Hx   . Stomach cancer Other    History  Substance Use Topics  . Smoking status: Never Smoker   . Smokeless tobacco: Never Used  . Alcohol Use: Yes     Comment: rare beer    Review of Systems  All other systems reviewed and are negative.   Allergies  Celebrex; Penicillins; and Sulfonamide derivatives  Home Medications   Prior to Admission medications   Medication Sig Start Date End Date Taking? Authorizing Provider  allopurinol (ZYLOPRIM) 300 MG tablet TAKE 1 TABLET EVERY DAY 03/08/14   Dorena Cookey, MD  amLODipine (NORVASC) 5 MG tablet Take 1 tablet (5 mg total) by mouth daily. 01/13/15   Elayne Snare, MD  aspirin 81 MG tablet Take 81 mg by mouth daily.      Historical Provider, MD  BAYER MICROLET LANCETS lancets Use as instructed to check blood sugar once a day dx code E11.9 Patient not taking:  Reported on 11/26/2014 08/27/14   Elayne Snare, MD  BD PEN NEEDLE NANO U/F 32G X 4 MM MISC USE EVERY DAY 05/14/14   Elayne Snare, MD  Blood Glucose Monitoring Suppl (ONETOUCH VERIO IQ SYSTEM) W/DEVICE KIT Use to check blood sugar 2 times daily 05/18/14   Elayne Snare, MD  doxycycline (VIBRAMYCIN) 100 MG capsule Take 1 capsule (100 mg total) by mouth 2 (two) times daily. For 10 days 01/20/15   Jacqulyn Cane, MD  esomeprazole (NEXIUM) 40 MG capsule Take 1 capsule (40 mg total) by mouth daily at 12 noon. 03/08/14   Dorena Cookey, MD  fluticasone (FLONASE) 50 MCG/ACT nasal spray PLACE 2 SPRAYS INTO THE NOSE DAILY. 03/08/14   Dorena Cookey, MD  glimepiride (AMARYL) 2 MG tablet Take 1 tablet (2 mg total) by mouth daily before supper. 10/18/14   Elayne Snare, MD  glucose blood (BAYER CONTOUR NEXT TEST) test strip Use as instructed to check blood sugar once a day dx code E11.9 Patient not taking: Reported on 11/26/2014 08/27/14   Elayne Snare, MD  glucose blood (ONETOUCH VERIO) test strip Use as instructed to check blood sugar 2 times per day 05/18/14   Elayne Snare, MD  indomethacin (INDOCIN) 25 MG capsule Take 1 capsule (25 mg total) by mouth 3 (three) times daily as needed (PRN  for gout pain). Patient not taking: Reported on 11/26/2014 05/21/14   Janeann Forehand, MD  lisinopril-hydrochlorothiazide (PRINZIDE,ZESTORETIC) 20-25 MG per tablet TAKE 1 TABLET EVERY MORNING 08/16/14   Dorena Cookey, MD  metFORMIN (GLUCOPHAGE) 1000 MG tablet TAKE 1 TABLET BY MOUTH TWO TIMES A DAY 03/22/14   Dorena Cookey, MD  [facility] DELICA LANCETS FINE MISC Use to check blood sugar 2 times per day 05/18/14   Elayne Snare, MD  simvastatin (ZOCOR) 20 MG tablet Take 1 tablet (20 mg total) by mouth at bedtime. 04/15/14   Elayne Snare, MD  Testosterone Hinda Kehr) 30 MG/ACT SOLN Place onto the skin.    Historical Provider, MD  vardenafil (LEVITRA) 20 MG tablet Take 1 tablet (20 mg total) by mouth daily as needed. Patient not taking: Reported on 11/26/2014  03/08/14   Dorena Cookey, MD  VIAGRA 100 MG tablet  04/01/14   Historical Provider, MD  VICTOZA 18 MG/3ML SOPN INJECT 1.8 MILLIGRAMS UNDER SKIN DAILY 08/16/14   Elayne Snare, MD   BP 120/77 mmHg  Pulse 100  Temp(Src) 98.3 F (36.8 C) (Oral)  Resp 14  Wt 278 lb (126.1 kg)  SpO2 96% Physical Exam  Constitutional: He is oriented to person, place, and time. He appears well-developed and well-nourished. No distress.  HENT:  Head: Normocephalic and atraumatic.    Right Ear: External ear normal.  Left Ear: External ear normal.  Nose: Nose normal.  Mouth/Throat: Oropharynx is clear and moist.  Eyes: Conjunctivae and EOM are normal. Pupils are equal, round, and reactive to light. No scleral icterus.  Neck: Normal range of motion. Neck supple.  Cardiovascular: Normal rate.   Pulmonary/Chest: Effort normal.  Abdominal: He exhibits no distension.  Musculoskeletal: Normal range of motion.  Lymphadenopathy:    He has no cervical adenopathy.  Neurological: He is alert and oriented to person, place, and time.  Skin: Skin is warm. Lesion noted.  Indurated, red, tender, warm, 3 x 3 mm area left face, over a hair follicle. No fluctuance or drainage or bleeding. No red streaks. No heat.  Psychiatric: He has a normal mood and affect.  Nursing note and vitals reviewed.   ED Course  Procedures (including critical care time) Labs Review Labs Reviewed - No data to display  Imaging Review No results found.   MDM   1. Folliculitis barbae    Likely has low-grade cellulitis but no evidence of systemic symptoms. There is no fluctuance or pustule, therefore incision and drainage not indicated at this time. Treatment options discussed, as well as risks, benefits, alternatives. Patient voiced understanding and agreement with the following plans: Discharge Medication List as of 01/20/2015  9:39 AM    START taking these medications   Details  doxycycline (VIBRAMYCIN) 100 MG capsule Take 1 capsule (100  mg total) by mouth 2 (two) times daily. For 10 days, Starting 01/20/2015, Until Discontinued, Normal       Avoid shaving with a razor which exacerbates this problem. Warm compresses. Use antibacterial soap for cleansing. Other advice given. Follow-up with your primary care doctor or dermatologist in 5-7 days if not improving, or sooner if symptoms become worse. Precautions discussed. Red flags discussed. Questions invited and answered. Patient voiced understanding and agreement.     Jacqulyn Cane, MD 01/20/15 [phone]

## 2015-01-20 NOTE — ED Notes (Signed)
Pt c/o bump/pimple to left side of face/neck x 4 week. He has drained pus from it once.

## 2015-02-14 ENCOUNTER — Other Ambulatory Visit: Payer: Self-pay | Admitting: Family Medicine

## 2015-02-21 ENCOUNTER — Other Ambulatory Visit (INDEPENDENT_AMBULATORY_CARE_PROVIDER_SITE_OTHER): Payer: BLUE CROSS/BLUE SHIELD

## 2015-02-21 DIAGNOSIS — IMO0002 Reserved for concepts with insufficient information to code with codable children: Secondary | ICD-10-CM

## 2015-02-21 DIAGNOSIS — E1165 Type 2 diabetes mellitus with hyperglycemia: Secondary | ICD-10-CM | POA: Diagnosis not present

## 2015-02-21 LAB — BASIC METABOLIC PANEL
BUN: 14 mg/dL (ref 6–23)
CO2: 27 meq/L (ref 19–32)
CREATININE: 1.29 mg/dL (ref 0.40–1.50)
Calcium: 9.3 mg/dL (ref 8.4–10.5)
Chloride: 103 mEq/L (ref 96–112)
GFR: 75.5 mL/min (ref >60.00)
Glucose, Bld: 123 mg/dL — ABNORMAL HIGH (ref 70–99)
POTASSIUM: 4.1 meq/L (ref 3.5–5.1)
SODIUM: 139 meq/L (ref 135–145)

## 2015-02-21 LAB — HEMOGLOBIN A1C: Hgb A1c MFr Bld: 6.5 % (ref 4.6–6.5)

## 2015-02-24 ENCOUNTER — Ambulatory Visit (INDEPENDENT_AMBULATORY_CARE_PROVIDER_SITE_OTHER): Payer: BLUE CROSS/BLUE SHIELD | Admitting: Endocrinology

## 2015-02-24 ENCOUNTER — Other Ambulatory Visit: Payer: Self-pay | Admitting: *Deleted

## 2015-02-24 ENCOUNTER — Encounter: Payer: Self-pay | Admitting: Endocrinology

## 2015-02-24 VITALS — BP 128/70 | HR 100 | Temp 98.3°F | Resp 16 | Ht 70.5 in | Wt 282.2 lb

## 2015-02-24 DIAGNOSIS — E785 Hyperlipidemia, unspecified: Secondary | ICD-10-CM

## 2015-02-24 DIAGNOSIS — E291 Testicular hypofunction: Secondary | ICD-10-CM

## 2015-02-24 DIAGNOSIS — E119 Type 2 diabetes mellitus without complications: Secondary | ICD-10-CM

## 2015-02-24 MED ORDER — AMLODIPINE BESYLATE 5 MG PO TABS: 5.0000 mg | ORAL_TABLET | Freq: Every day | ORAL | Status: DC

## 2015-02-24 NOTE — Progress Notes (Signed)
Patient ID: Clifford Kramer, male   DOB: [birth year], 49 y.o.   MRN: [id number]   Reason for Appointment : Followup for Type 2 Diabetes  History of Present Illness          Diagnosis: Type 2 diabetes mellitus, date of diagnosis: 2005     Background information: He was probably started on metformin at the time of diagnosis when his blood sugars were not very high About 5-6 years ago because of higher blood sugars he was also given glipizide  He appeared to have  progression of his diabetes with A1c going up to 8.1% in 01/2013; also had difficulty with losing weight before he was started on Victoza.  With this his blood sugars were significantly better His glipizide was reduced to 2.5 mg in 10/14 because of rare hypoglycemia He has taken 1.8 mg of Victoza since 7/15   RECENT history:   Antihyperglycemic drugs the patient is taking are: Victoza 1.8 mg, Metformin 2 g and Amaryl 2 mg acs   Although his A1c is now 6.5 his blood sugars at home is still looking relatively high He is using the new One Touch Verio meter and blood sugars are not as high compared to the last visit His medications were not changed on the last visit Current blood sugar patterns and problems:  He has check blood sugars mostly when he comes back from work overnight and most of these readings are in the morning hours  Most of his blood sugars in the morning hours are mildly increased and are a few hours after his meal at night  He thinks that he felt hypoglycemic yesterday was sleeping during the day and blood sugar was 105  Blood sugars in the evenings before eating are fairly good but may be slightly high later on  Again he is having difficulty losing weight even though he thinks he is generally watching his diet and continuing Victoza as well as exercising   Side effects from medications have been: None       Monitors blood glucose:  once or twice a day.         Glucometer: One Touch Verio     Blood  Glucose readings   Mean values apply above for all meters except median for One Touch  PRE-MEAL  a.m.   9-10 AM  Dinner Bedtime Overall  Glucose range:  106-249   109-187   117-195   169, 186, 191    Mean/median:  144   148     144    Hypoglycemia: None documented          LIFESTYLE: Meals: 3 meals per day. Dinner 6 pm, Bfst 7 am; lunch 12 am;   eating out periodically His breakfast maybe the cereal or eggs and toast and meat  Usually eating fruit and sandwich at lunch, usually a grilled meat at suppertime  Physical activity: exercise: 4/7 days a week, at gym or walking, 90 min          Dietician visit: 05/2013           Wt Readings from Last 3 Encounters:  02/24/15 282 lb 3.2 oz (128.005 kg)  01/20/15 278 lb (126.1 kg)  11/26/14 280 lb (127.007 kg)   Lab Results  Component Value Date   HGBA1C 6.5 02/21/2015   HGBA1C 6.7* 11/22/2014   HGBA1C 7.4* 08/25/2014   Lab Results  Component Value Date   MICROALBUR 1.4 08/25/2014   LDLCALC 63  05/25/2014   CREATININE 1.29 02/21/2015        Medication List       This list is accurate as of: 02/24/15 11:59 PM.  Always use your most recent med list.               allopurinol 300 MG tablet  Commonly known as:  ZYLOPRIM  TAKE 1 TABLET EVERY DAY     amLODipine 5 MG tablet  Commonly known as:  NORVASC  Take 1 tablet (5 mg total) by mouth daily.     aspirin 81 MG tablet  Take 81 mg by mouth daily.     AXIRON 30 MG/ACT Soln  Generic drug:  Testosterone  Place onto the skin.     BD PEN NEEDLE NANO U/F 32G X 4 MM Misc  Generic drug:  Insulin Pen Needle  USE EVERY DAY     doxycycline 100 MG capsule  Commonly known as:  VIBRAMYCIN  Take 1 capsule (100 mg total) by mouth 2 (two) times daily. For 10 days     esomeprazole 40 MG capsule  Commonly known as:  NEXIUM  Take 1 capsule (40 mg total) by mouth daily at 12 noon.     fluticasone 50 MCG/ACT nasal spray  Commonly known as:  FLONASE  PLACE 2 SPRAYS INTO THE NOSE  DAILY.     glimepiride 2 MG tablet  Commonly known as:  AMARYL  Take 1 tablet (2 mg total) by mouth daily before supper.     glucose blood test strip  Commonly known as:  ONETOUCH VERIO  Use as instructed to check blood sugar 2 times per day     glucose blood test strip  Commonly known as:  BAYER CONTOUR NEXT TEST  Use as instructed to check blood sugar once a day dx code E11.9     indomethacin 25 MG capsule  Commonly known as:  INDOCIN  Take 1 capsule (25 mg total) by mouth 3 (three) times daily as needed (PRN for gout pain).     lisinopril-hydrochlorothiazide 20-25 MG per tablet  Commonly known as:  PRINZIDE,ZESTORETIC  TAKE 1 TABLET EVERY MORNING     metFORMIN 1000 MG tablet  Commonly known as:  GLUCOPHAGE  TAKE 1 TABLET BY MOUTH TWO TIMES A DAY     ONETOUCH DELICA LANCETS FINE Misc  Use to check blood sugar 2 times per day     BAYER MICROLET LANCETS lancets  Use as instructed to check blood sugar once a day dx code E11.9     ONETOUCH VERIO IQ SYSTEM W/DEVICE Kit  Use to check blood sugar 2 times daily     simvastatin 20 MG tablet  Commonly known as:  ZOCOR  Take 1 tablet (20 mg total) by mouth at bedtime.     vardenafil 20 MG tablet  Commonly known as:  LEVITRA  Take 1 tablet (20 mg total) by mouth daily as needed.     VIAGRA 100 MG tablet  Generic drug:  sildenafil     VICTOZA 18 MG/3ML Sopn  Generic drug:  Liraglutide  INJECT 1.8 MILLIGRAMS UNDER SKIN DAILY        Allergies:  Allergies  Allergen Reactions  . Celebrex [Celecoxib] Itching    REACTION: itching  . Penicillins Itching  . Sulfonamide Derivatives Itching    REACTION: itching    Past Medical History  Diagnosis Date  . Diabetes mellitus   . Hypertension   . Hyperlipidemia   . Gout  Past Surgical History  Procedure Laterality Date  . Knee surgery Left 2005    Family History  Problem Relation Age of Onset  . Diabetes Mother   . Diabetes Maternal Aunt   . Diabetes Maternal  Grandmother   . Heart disease Neg Hx   . Colon cancer Neg Hx   . Esophageal cancer Neg Hx   . Rectal cancer Neg Hx   . Stomach cancer Other     Social History:  reports that he has never smoked. He has never used smokeless tobacco. He reports that he drinks alcohol. He reports that he does not use illicit drugs.    Review of Systems   HYPERTENSION: Does  appear to be well controlled with adding Norvasc He is followed by his PCP occasionally  He is also on  Zestoretic No home blood pressure testing     Hyperlipidemia: LDL has been controlled, baseline previously 109, has been tolerating simvastatin. Has a low HDL  Lab Results  Component Value Date   CHOL 112 05/25/2014   HDL 30.80* 05/25/2014   LDLCALC 63 05/25/2014   TRIG 90.0 05/25/2014   CHOLHDL 4 05/25/2014       He has had a history of erectile dysfunction and uses Levitra as needed     He has been on testosterone supplements since 2011 when his level was mildly low around 300  but no further evaluation done.  This is being monitored by the urologist. He did not report feeling subjectively better with taking the supplements.  No  testosterone levels available on current record    Urine microalbumin normal in 1/16  LABS:  Lab on 02/21/2015  Component Date Value Ref Range Status  . Hgb A1c MFr Bld 02/21/2015 6.5  4.6 - 6.5 % Final   Glycemic Control Guidelines for People with Diabetes:Non Diabetic:  <6%Goal of Therapy: <7%Additional Action Suggested:  >8%   . Sodium 02/21/2015 139  135 - 145 mEq/L Final  . Potassium 02/21/2015 4.1  3.5 - 5.1 mEq/L Final  . Chloride 02/21/2015 103  96 - 112 mEq/L Final  . CO2 02/21/2015 27  19 - 32 mEq/L Final  . Glucose, Bld 02/21/2015 123* 70 - 99 mg/dL Final  . BUN 02/21/2015 14  6 - 23 mg/dL Final  . Creatinine, Ser 02/21/2015 1.29  0.40 - 1.50 mg/dL Final  . Calcium 02/21/2015 9.3  8.4 - 10.5 mg/dL Final  . GFR 02/21/2015 75.50  >60.00 mL/min Final     Physical  Examination:  BP 128/70 mmHg  Pulse 100  Temp(Src) 98.3 F (36.8 C)  Resp 16  Ht 5' 10.5" (1.791 m)  Wt 282 lb 3.2 oz (128.005 kg)  BMI 39.91 kg/m2  SpO2 95%     No ankle edema    ASSESSMENT/PLAN:   Diabetes type 2, fair control  His blood sugar control appears to be somewhat better as judged by his A1c which is upper normal However his home blood sugars are still relatively high including some readings near 200 He is already on 1.8 mg Victoza Since he has mostly high readings after meals and overnight when he is working will not be able to change his Amaryl; that sugars are relatively lower in the afternoon and rarely he feels hypoglycemic also  Would consider adding Invokana or Jardiance if blood sugars are not improved at home  HYPERTENSION:  His blood pressure is better controlled with adding amlodipine   Clifford Kramer 02/25/2015, 12:46 PM

## 2015-02-24 NOTE — Patient Instructions (Signed)
Check blood sugars on waking up .. 2-3 .. times a week  Also check blood sugars about 2 hours after a meal and do this after different meals by rotation  Recommended blood sugar levels on waking up is 90-130 and about 2 hours after meal is 140-180 Please bring blood sugar monitor to each visit.  

## 2015-03-08 ENCOUNTER — Other Ambulatory Visit: Payer: Self-pay | Admitting: Endocrinology

## 2015-03-17 LAB — HM DIABETES EYE EXAM

## 2015-04-03 ENCOUNTER — Other Ambulatory Visit: Payer: Self-pay | Admitting: Endocrinology

## 2015-04-14 ENCOUNTER — Other Ambulatory Visit: Payer: Self-pay | Admitting: Endocrinology

## 2015-04-21 ENCOUNTER — Encounter: Payer: Self-pay | Admitting: Family Medicine

## 2015-04-22 ENCOUNTER — Other Ambulatory Visit: Payer: Self-pay | Admitting: Family Medicine

## 2015-04-28 ENCOUNTER — Encounter: Payer: Self-pay | Admitting: Emergency Medicine

## 2015-04-28 ENCOUNTER — Other Ambulatory Visit: Payer: Self-pay | Admitting: Family Medicine

## 2015-04-28 ENCOUNTER — Emergency Department
Admission: EM | Admit: 2015-04-28 | Discharge: 2015-04-28 | Disposition: A | Discharge status: Home / Self Care | Payer: BLUE CROSS/BLUE SHIELD | Source: Home / Self Care | Attending: Family Medicine | Admitting: Family Medicine

## 2015-04-28 DIAGNOSIS — J209 Acute bronchitis, unspecified: Secondary | ICD-10-CM | POA: Diagnosis not present

## 2015-04-28 DIAGNOSIS — R062 Wheezing: Secondary | ICD-10-CM | POA: Diagnosis not present

## 2015-04-28 MED ORDER — DM-GUAIFENESIN ER 30-600 MG PO TB12: 1.0000 | ORAL_TABLET | Freq: Two times a day (BID) | ORAL | Status: DC

## 2015-04-28 MED ORDER — ALBUTEROL SULFATE HFA 108 (90 BASE) MCG/ACT IN AERS: 1.0000 | INHALATION_SPRAY | Freq: Four times a day (QID) | RESPIRATORY_TRACT | Status: DC | PRN

## 2015-04-28 MED ORDER — BENZONATATE 100 MG PO CAPS: 100.0000 mg | ORAL_CAPSULE | Freq: Three times a day (TID) | ORAL | Status: DC

## 2015-04-28 MED ORDER — PREDNISONE 20 MG PO TABS: ORAL_TABLET | ORAL | Status: DC

## 2015-04-28 MED ORDER — AZITHROMYCIN 250 MG PO TABS: 250.0000 mg | ORAL_TABLET | Freq: Every day | ORAL | Status: DC

## 2015-04-28 NOTE — Discharge Instructions (Signed)
°  Please take antibiotics as prescribed and be sure to complete entire course even if you start to feel better to ensure infection does not come back. ° °

## 2015-04-28 NOTE — ED Notes (Signed)
Started one week ago, congestion, drainage, then dry cough for 4 days, sore throat started yesterday

## 2015-04-28 NOTE — ED Provider Notes (Signed)
CSN: [id number]     Arrival date & time 04/28/15  5916 History   First MD Initiated Contact with Patient 04/28/15 0945     Chief Complaint  Patient presents with  . URI   (Consider location/radiation/quality/duration/timing/severity/associated sxs/prior Treatment) HPI Pt is a 49yo male presenting to [facility] with c/o gradually worsening cough, congestion, chest tightness, and wheeze that started about 1 week ago.  He also developed a sore throat yesterday.  He has not tried anything for cough.  He reports family members were sick but now they are better. Cough is dry, moderately intermittent. Denies hx of asthma but does recall having to use an inhaler about 10 years ago when he was sick.  Denies smoking. Reports subjective fever but denies n/v/d. Denies recent travel.  Past Medical History  Diagnosis Date  . Diabetes mellitus   . Hypertension   . Hyperlipidemia   . Gout    Past Surgical History  Procedure Laterality Date  . Knee surgery Left 2005   Family History  Problem Relation Age of Onset  . Diabetes Mother   . Diabetes Maternal Aunt   . Diabetes Maternal Grandmother   . Heart disease Neg Hx   . Colon cancer Neg Hx   . Esophageal cancer Neg Hx   . Rectal cancer Neg Hx   . Stomach cancer Other    Social History  Substance Use Topics  . Smoking status: Never Smoker   . Smokeless tobacco: Never Used  . Alcohol Use: Yes     Comment: rare beer    Review of Systems  Constitutional: Positive for fever ( subjective). Negative for chills.  HENT: Positive for congestion, rhinorrhea and sore throat. Negative for ear pain, trouble swallowing and voice change.   Respiratory: Positive for cough, chest tightness and wheezing. Negative for shortness of breath.   Cardiovascular: Negative for chest pain and palpitations.  Gastrointestinal: Negative for nausea, vomiting, abdominal pain and diarrhea.  Musculoskeletal: Negative for myalgias, back pain and arthralgias.  Skin: Negative for  rash.  All other systems reviewed and are negative.   Allergies  Celebrex; Penicillins; and Sulfonamide derivatives  Home Medications   Prior to Admission medications   Medication Sig Start Date End Date Taking? Authorizing Provider  albuterol (PROVENTIL HFA;VENTOLIN HFA) 108 (90 BASE) MCG/ACT inhaler Inhale 1-2 puffs into the lungs every 6 (six) hours as needed for wheezing or shortness of breath. 04/28/15   Noland Fordyce, PA-C  allopurinol (ZYLOPRIM) 300 MG tablet TAKE 1 TABLET EVERY DAY 03/08/14   Dorena Cookey, MD  amLODipine (NORVASC) 5 MG tablet Take 1 tablet (5 mg total) by mouth daily. 02/24/15   Elayne Snare, MD  aspirin 81 MG tablet Take 81 mg by mouth daily.      Historical Provider, MD  azithromycin (ZITHROMAX) 250 MG tablet Take 1 tablet (250 mg total) by mouth daily. Take first 2 tablets together, then 1 every day until finished. 04/28/15   Noland Fordyce, PA-C  BAYER MICROLET LANCETS lancets Use as instructed to check blood sugar once a day dx code E11.9 08/27/14   Elayne Snare, MD  BD PEN NEEDLE NANO U/F 32G X 4 MM MISC USE EVERY DAY 05/14/14   Elayne Snare, MD  benzonatate (TESSALON) 100 MG capsule Take 1 capsule (100 mg total) by mouth every 8 (eight) hours. 04/28/15   Noland Fordyce, PA-C  Blood Glucose Monitoring Suppl (ONETOUCH VERIO IQ SYSTEM) W/DEVICE KIT Use to check blood sugar 2 times daily 05/18/14  Elayne Snare, MD  dextromethorphan-guaiFENesin [facility] DM) 30-600 MG 12hr tablet Take 1 tablet by mouth 2 (two) times daily. Take with large glass of water 04/28/15   Noland Fordyce, PA-C  doxycycline (VIBRAMYCIN) 100 MG capsule Take 1 capsule (100 mg total) by mouth 2 (two) times daily. For 10 days 01/20/15   Jacqulyn Cane, MD  esomeprazole (NEXIUM) 40 MG capsule Take 1 capsule (40 mg total) by mouth daily at 12 noon. 03/08/14   Dorena Cookey, MD  fluticasone (FLONASE) 50 MCG/ACT nasal spray PLACE 2 SPRAYS INTO THE NOSE DAILY. 04/22/15   Dorena Cookey, MD  glimepiride (AMARYL) 2 MG  tablet TAKE 1 TABLET (2 MG TOTAL) BY MOUTH DAILY BEFORE SUPPER. 04/14/15   Elayne Snare, MD  glucose blood (BAYER CONTOUR NEXT TEST) test strip Use as instructed to check blood sugar once a day dx code E11.9 08/27/14   Elayne Snare, MD  glucose blood (ONETOUCH VERIO) test strip Use as instructed to check blood sugar 2 times per day 05/18/14   Elayne Snare, MD  indomethacin (INDOCIN) 25 MG capsule Take 1 capsule (25 mg total) by mouth 3 (three) times daily as needed (PRN for gout pain). 05/21/14   Janeann Forehand, MD  lisinopril-hydrochlorothiazide (PRINZIDE,ZESTORETIC) 20-25 MG per tablet TAKE 1 TABLET EVERY MORNING 02/14/15   Dorena Cookey, MD  metFORMIN (GLUCOPHAGE) 1000 MG tablet TAKE 1 TABLET BY MOUTH TWO TIMES A DAY 03/22/14   Dorena Cookey, MD  [facility] LANCETS FINE MISC Use to check blood sugar 2 times per day 05/18/14   Elayne Snare, MD  predniSONE (DELTASONE) 20 MG tablet 2 tabs po daily x 4 days 04/28/15   Noland Fordyce, PA-C  simvastatin (ZOCOR) 20 MG tablet TAKE 1 TABLET (20 MG TOTAL) BY MOUTH AT BEDTIME. 04/05/15   Elayne Snare, MD  Testosterone (AXIRON) 30 MG/ACT SOLN Place onto the skin.    Historical Provider, MD  vardenafil (LEVITRA) 20 MG tablet Take 1 tablet (20 mg total) by mouth daily as needed. 03/08/14   Dorena Cookey, MD  VIAGRA 100 MG tablet  04/01/14   Historical Provider, MD  VICTOZA 18 MG/3ML SOPN INJECT 1.8 MG UNDER THE SKIN DAILY. 03/09/15   Elayne Snare, MD   Meds Ordered and Administered this Visit  Medications - No data to display  BP 135/84 mmHg  Pulse 87  Temp(Src) 98.9 F (37.2 C) (Oral)  Ht _0  (1.803 m)  Wt 282 lb (127.914 kg)  BMI 39.35 kg/m2  SpO2 97% No data found.   Physical Exam  Constitutional: He appears well-developed and well-nourished.  HENT:  Head: Normocephalic and atraumatic.  Right Ear: Hearing, tympanic membrane, external ear and ear canal normal.  Left Ear: Hearing, tympanic membrane, external ear and ear canal normal.  Nose: Nose  normal.  Mouth/Throat: Uvula is midline, oropharynx is clear and moist and mucous membranes are normal.  Eyes: Conjunctivae are normal. No scleral icterus.  Neck: Normal range of motion. Neck supple.  Cardiovascular: Normal rate, regular rhythm and normal heart sounds.   Pulmonary/Chest: Effort normal. No respiratory distress. He has wheezes. He has rales. He exhibits no tenderness.  Intermittent dry cough during exam. Expiratory wheeze in Left lower lung fields with faint rhonchi in bilateral lower lung fields.  Abdominal: Soft. Bowel sounds are normal. He exhibits no distension and no mass. There is no tenderness. There is no rebound and no guarding.  Musculoskeletal: Normal range of motion.  Neurological: He is alert.  Skin: Skin is warm and dry.  Nursing note and vitals reviewed.   ED Course  Procedures (including critical care time)  Labs Review Labs Reviewed - No data to display  Imaging Review No results found.    MDM   1. Acute bronchitis, unspecified organism   2. Wheeze     Pt c/o worsening cough, congestion, wheeze and sore throat. Pt does have cough during exam as well as expiratory wheeze and rhonchi on lung exam.  Will start pt on 4 days of prednisone, albuterol inhaler and Azithromycin for possible bacterial cause of bronchitis.  Also prescribed Mucinex DM and Tessalon for cough. Pt requested a cough medication that will not cause drowsiness as he works night shift. Declined work note. Advised to f/u in 3-4 days if not improving, sooner if worsening. Patient verbalized understanding and agreement with treatment plan.     Noland Fordyce, PA-C 04/28/15 1008

## 2015-05-01 ENCOUNTER — Other Ambulatory Visit: Payer: Self-pay | Admitting: Endocrinology

## 2015-05-01 ENCOUNTER — Telehealth: Payer: Self-pay | Admitting: Emergency Medicine

## 2015-05-01 ENCOUNTER — Other Ambulatory Visit: Payer: Self-pay | Admitting: Family Medicine

## 2015-05-16 ENCOUNTER — Other Ambulatory Visit (INDEPENDENT_AMBULATORY_CARE_PROVIDER_SITE_OTHER): Payer: BLUE CROSS/BLUE SHIELD

## 2015-05-16 DIAGNOSIS — E785 Hyperlipidemia, unspecified: Secondary | ICD-10-CM

## 2015-05-16 DIAGNOSIS — E119 Type 2 diabetes mellitus without complications: Secondary | ICD-10-CM

## 2015-05-16 DIAGNOSIS — E291 Testicular hypofunction: Secondary | ICD-10-CM | POA: Diagnosis not present

## 2015-05-16 LAB — LIPID PANEL
Cholesterol: 104 mg/dL (ref 0–200)
HDL: 34.1 mg/dL — ABNORMAL LOW (ref >39.00)
LDL Cholesterol: 57 mg/dL (ref 0–99)
NonHDL: 69.79
Total CHOL/HDL Ratio: 3
Triglycerides: 64 mg/dL (ref 0.0–149.0)
VLDL: 12.8 mg/dL (ref 0.0–40.0)

## 2015-05-16 LAB — COMPREHENSIVE METABOLIC PANEL
ALT: 31 U/L (ref 0–53)
AST: 23 U/L (ref 0–37)
Albumin: 4 g/dL (ref 3.5–5.2)
Alkaline Phosphatase: 69 U/L (ref 39–117)
BUN: 19 mg/dL (ref 6–23)
CO2: 29 mEq/L (ref 19–32)
Calcium: 9.6 mg/dL (ref 8.4–10.5)
Chloride: 103 mEq/L (ref 96–112)
Creatinine, Ser: 1.37 mg/dL (ref 0.40–1.50)
GFR: 70.37 mL/min (ref >60.00)
Glucose, Bld: 123 mg/dL — ABNORMAL HIGH (ref 70–99)
Potassium: 4.5 mEq/L (ref 3.5–5.1)
Sodium: 141 mEq/L (ref 135–145)
Total Bilirubin: 0.4 mg/dL (ref 0.2–1.2)
Total Protein: 7.2 g/dL (ref 6.0–8.3)

## 2015-05-16 LAB — HEMOGLOBIN A1C: Hgb A1c MFr Bld: 6.8 % — ABNORMAL HIGH (ref 4.6–6.5)

## 2015-05-16 LAB — TESTOSTERONE: Testosterone: 201.28 ng/dL — ABNORMAL LOW (ref 300.00–890.00)

## 2015-05-23 ENCOUNTER — Ambulatory Visit (INDEPENDENT_AMBULATORY_CARE_PROVIDER_SITE_OTHER): Payer: BLUE CROSS/BLUE SHIELD | Admitting: Endocrinology

## 2015-05-23 ENCOUNTER — Encounter: Payer: Self-pay | Admitting: Endocrinology

## 2015-05-23 ENCOUNTER — Other Ambulatory Visit: Payer: Self-pay | Admitting: *Deleted

## 2015-05-23 VITALS — BP 128/82 | HR 98 | Temp 98.2°F | Resp 16 | Ht 70.5 in | Wt 279.6 lb

## 2015-05-23 DIAGNOSIS — E291 Testicular hypofunction: Secondary | ICD-10-CM

## 2015-05-23 DIAGNOSIS — Z23 Encounter for immunization: Secondary | ICD-10-CM

## 2015-05-23 DIAGNOSIS — E1165 Type 2 diabetes mellitus with hyperglycemia: Secondary | ICD-10-CM

## 2015-05-23 MED ORDER — TESTOSTERONE 4 MG/24HR TD PT24: 1.0000 | MEDICATED_PATCH | Freq: Every day | TRANSDERMAL | Status: DC

## 2015-05-23 NOTE — Progress Notes (Signed)
Patient ID: Clifford Kramer, male   DOB: Jun 13, 1966, 49 y.o.   MRN: [id number]   Reason for Appointment : Followup for Type 2 Diabetes  History of Present Illness          Diagnosis: Type 2 diabetes mellitus, date of diagnosis: 2005     Background information: He was probably started on metformin at the time of diagnosis when his blood sugars were not very high About 5-6 years ago because of higher blood sugars he was also given glipizide  He appeared to have  progression of his diabetes with A1c going up to 8.1% in 01/2013; also had difficulty with losing weight before he was started on Victoza.  With this his blood sugars were significantly better His glipizide was reduced to 2.5 mg in 10/14 because of rare hypoglycemia He has taken 1.8 mg of Victoza since 7/15   RECENT history:   Antihyperglycemic drugs the patient is taking are: Victoza 1.8 mg, Metformin 2 g and Amaryl 2 mg acs   Although his A1c is now 6.8  His medications were not changed on the last visit  Current blood sugar patterns and problems:  He has been checking blood sugars very sporadically and only on 5 days in the last month  His blood sugars are fairly good including on the fasting lab; the same day his glucose in our before at home was 158  However has not done any postprandial readings  With getting prednisone last month his blood sugars went up to about 190 but these are not available on his record  He has been generally compliant with exercise and diet  Only once from hypoglycemic when he had only a salad without any protein   Side effects from medications have been: None       Monitors blood glucose:  infrequently.         Glucometer: One Touch Verio     Blood Glucose readings   Mean values apply above for all meters except median for One Touch  PRE-MEAL Fasting Lunch Dinner Bedtime Overall  Glucose range: 134, 158 107 106 105   Mean/median:     107   Hypoglycemia: once as above       LIFESTYLE: Meals: 3 meals per day. Dinner 6 pm, Bfst 7 am; lunch 12 am;  eating out periodically His breakfast maybe the cereal or eggs and toast and meat  Usually eating fruit and sandwich at lunch, usually a grilled meat at suppertime   Physical activity: exercise: 4/7 days a week, at gym or walking, 90 min          Dietician visit: 05/2013           Wt Readings from Last 3 Encounters:  05/23/15 279 lb 9.6 oz (126.826 kg)  04/28/15 282 lb (127.914 kg)  02/24/15 282 lb 3.2 oz (128.005 kg)   Lab Results  Component Value Date   HGBA1C 6.8* 05/16/2015   HGBA1C 6.5 02/21/2015   HGBA1C 6.7* 11/22/2014   Lab Results  Component Value Date   MICROALBUR 1.4 08/25/2014   LDLCALC 57 05/16/2015   CREATININE 1.37 05/16/2015        Medication List       This list is accurate as of: 05/23/15  9:55 AM.  Always use your most recent med list.               albuterol 108 (90 BASE) MCG/ACT inhaler  Commonly known as:  PROVENTIL HFA;VENTOLIN  HFA  Inhale 1-2 puffs into the lungs every 6 (six) hours as needed for wheezing or shortness of breath.     allopurinol 300 MG tablet  Commonly known as:  ZYLOPRIM  TAKE 1 TABLET EVERY DAY     amLODipine 5 MG tablet  Commonly known as:  NORVASC  Take 1 tablet (5 mg total) by mouth daily.     aspirin 81 MG tablet  Take 81 mg by mouth daily.     AXIRON 30 MG/ACT Soln  Generic drug:  Testosterone  Place onto the skin.     azithromycin 250 MG tablet  Commonly known as:  ZITHROMAX  Take 1 tablet (250 mg total) by mouth daily. Take first 2 tablets together, then 1 every day until finished.     BD PEN NEEDLE NANO U/F 32G X 4 MM Misc  Generic drug:  Insulin Pen Needle  USE EVERY DAY     benzonatate 100 MG capsule  Commonly known as:  TESSALON  Take 1 capsule (100 mg total) by mouth every 8 (eight) hours.     dextromethorphan-guaiFENesin 30-600 MG 12hr tablet  Commonly known as:  MUCINEX DM  Take 1 tablet by mouth 2 (two) times daily.  Take with large glass of water     doxycycline 100 MG capsule  Commonly known as:  VIBRAMYCIN  Take 1 capsule (100 mg total) by mouth 2 (two) times daily. For 10 days     esomeprazole 40 MG capsule  Commonly known as:  NEXIUM  Take 1 capsule (40 mg total) by mouth daily at 12 noon.     fluticasone 50 MCG/ACT nasal spray  Commonly known as:  FLONASE  PLACE 2 SPRAYS INTO THE NOSE DAILY.     glimepiride 2 MG tablet  Commonly known as:  AMARYL  TAKE 1 TABLET (2 MG TOTAL) BY MOUTH DAILY BEFORE SUPPER.     glucose blood test strip  Commonly known as:  BAYER CONTOUR NEXT TEST  Use as instructed to check blood sugar once a day dx code E11.9     ONETOUCH VERIO test strip  Generic drug:  glucose blood  USE AS INSTRUCTED TO CHECK BLOOD SUGAR 2 TIMES PER DAY     indomethacin 25 MG capsule  Commonly known as:  INDOCIN  Take 1 capsule (25 mg total) by mouth 3 (three) times daily as needed (PRN for gout pain).     lisinopril-hydrochlorothiazide 20-25 MG tablet  Commonly known as:  PRINZIDE,ZESTORETIC  TAKE 1 TABLET EVERY MORNING     metFORMIN 1000 MG tablet  Commonly known as:  GLUCOPHAGE  TAKE 1 TABLET BY MOUTH TWO TIMES A DAY     ONETOUCH DELICA LANCETS FINE Misc  Use to check blood sugar 2 times per day     BAYER MICROLET LANCETS lancets  Use as instructed to check blood sugar once a day dx code E11.9     ONETOUCH VERIO IQ SYSTEM W/DEVICE Kit  Use to check blood sugar 2 times daily     predniSONE 20 MG tablet  Commonly known as:  DELTASONE  2 tabs po daily x 4 days     simvastatin 20 MG tablet  Commonly known as:  ZOCOR  TAKE 1 TABLET (20 MG TOTAL) BY MOUTH AT BEDTIME.     vardenafil 20 MG tablet  Commonly known as:  LEVITRA  Take 1 tablet (20 mg total) by mouth daily as needed.     VIAGRA 100 MG tablet  Generic  drug:  sildenafil     VICTOZA 18 MG/3ML Sopn  Generic drug:  Liraglutide  INJECT 1.8 MG UNDER THE SKIN DAILY.        Allergies:  Allergies  Allergen  Reactions  . Celebrex [Celecoxib] Itching    REACTION: itching  . Penicillins Itching  . Sulfonamide Derivatives Itching    REACTION: itching    Past Medical History  Diagnosis Date  . Diabetes mellitus   . Hypertension   . Hyperlipidemia   . Gout     Past Surgical History  Procedure Laterality Date  . Knee surgery Left 2005    Family History  Problem Relation Age of Onset  . Diabetes Mother   . Diabetes Maternal Aunt   . Diabetes Maternal Grandmother   . Heart disease Neg Hx   . Colon cancer Neg Hx   . Esophageal cancer Neg Hx   . Rectal cancer Neg Hx   . Stomach cancer Other     Social History:  reports that he has never smoked. He has never used smokeless tobacco. He reports that he drinks alcohol. He reports that he does not use illicit drugs.    Review of Systems   HYPERTENSION: Does  appear to be well controlled with adding Norvasc  He is also on  Zestoretic     Hyperlipidemia: LDL has been controlled, baseline previously 109, has been tolerating simvastatin. Has a low HDL  Lab Results  Component Value Date   CHOL 104 05/16/2015   HDL 34.10* 05/16/2015   LDLCALC 57 05/16/2015   TRIG 64.0 05/16/2015   CHOLHDL 3 05/16/2015       He has had a history of erectile dysfunction and uses Levitra as needed     He has been on testosterone supplements since 2011 when his level was mildly low around 300  but no further evaluation done.  This is being monitored by the urologist.  He did not report feeling subjectively better with taking the supplements.   He has a relatively low level on using Axiron.  He is applying is for the correct technique, 2 pumps under each arm.  He is very compliant with this.  He says that occasionally the medication will drip down the arm Occasionally will feel tired.  Is able to exercise Also at times he has decreased libido  Lab Results  Component Value Date   TESTOSTERONE 201.28* 05/16/2015     Urine microalbumin normal in  1/16  LABS:  No visits with results within 1 Week(s) from this visit. Latest known visit with results is:  Appointment on 05/16/2015  Component Date Value Ref Range Status  . Hgb A1c MFr Bld 05/16/2015 6.8* 4.6 - 6.5 % Final   Glycemic Control Guidelines for People with Diabetes:Non Diabetic:  <6%Goal of Therapy: <7%Additional Action Suggested:  >8%   . Sodium 05/16/2015 141  135 - 145 mEq/L Final  . Potassium 05/16/2015 4.5  3.5 - 5.1 mEq/L Final  . Chloride 05/16/2015 103  96 - 112 mEq/L Final  . CO2 05/16/2015 29  19 - 32 mEq/L Final  . Glucose, Bld 05/16/2015 123* 70 - 99 mg/dL Final  . BUN 05/16/2015 19  6 - 23 mg/dL Final  . Creatinine, Ser 05/16/2015 1.37  0.40 - 1.50 mg/dL Final  . Total Bilirubin 05/16/2015 0.4  0.2 - 1.2 mg/dL Final  . Alkaline Phosphatase 05/16/2015 69  39 - 117 U/L Final  . AST 05/16/2015 23  0 - 37 U/L Final  .  ALT 05/16/2015 31  0 - 53 U/L Final  . Total Protein 05/16/2015 7.2  6.0 - 8.3 g/dL Final  . Albumin 05/16/2015 4.0  3.5 - 5.2 g/dL Final  . Calcium 05/16/2015 9.6  8.4 - 10.5 mg/dL Final  . GFR 05/16/2015 70.37  >60.00 mL/min Final  . Cholesterol 05/16/2015 104  0 - 200 mg/dL Final   ATP III Classification       Desirable:  < 200 mg/dL               Borderline High:  200 - 239 mg/dL          High:  > = 240 mg/dL  . Triglycerides 05/16/2015 64.0  0.0 - 149.0 mg/dL Final   Normal:  <150 mg/dLBorderline High:  150 - 199 mg/dL  . HDL 05/16/2015 34.10* >39.00 mg/dL Final  . VLDL 05/16/2015 12.8  0.0 - 40.0 mg/dL Final  . LDL Cholesterol 05/16/2015 57  0 - 99 mg/dL Final  . Total CHOL/HDL Ratio 05/16/2015 3   Final                  Men          Women1/2 Average Risk     3.4          3.3Average Risk          5.0          4.42X Average Risk          9.6          7.13X Average Risk          15.0          11.0                      . NonHDL 05/16/2015 69.79   Final   NOTE:  Non-HDL goal should be 30 mg/dL higher than patient's LDL goal (i.e. LDL goal of  < 70 mg/dL, would have non-HDL goal of < 100 mg/dL)  . Testosterone 05/16/2015 201.28* 300.00 - 890.00 ng/dL Final     Physical Examination:  BP 128/82 mmHg  Pulse 98  Temp(Src) 98.2 F (36.8 C)  Resp 16  Ht 5' 10.5" (1.791 m)  Wt 279 lb 9.6 oz (126.826 kg)  BMI 39.54 kg/m2  SpO2 96%     No  edema    ASSESSMENT/PLAN:   Diabetes type 2, fair control, BMI 39  His blood sugar control appears to be about the same overall with A1c 6.8 However he has not checked his sugars much lately. Does not appear to have any consistently higher blood sugars; he had a fasting of 158 but lab glucose was 123 an hour later the same day  He will continue the same regimen for now He will try to use the One Touch ultra 2 monitor and if it is more accurate he'll continue to use this instead of the Verio  Would consider adding Invokana or Jardiance if blood sugars are not controlled  HYPOGONADISM: He does not appear to have adequate levels with Axiron and he finds this difficult to apply He may be somewhat symptomatic also especially with decreased libido He will give Androderm a try starting with 4 mg patch once a day and consider 6 mg if needed To recheck testosterone level on the next visit  HYPERTENSION:  His blood pressure is controlled    Jadis Pitter 05/23/2015, 9:55 AM

## 2015-05-23 NOTE — Patient Instructions (Signed)
Use patch once daily on upper body  Check blood sugars on waking up 2-3  times a week Also check blood sugars about 2 hours after a meal and do this after different meals by rotation  Recommended blood sugar levels on waking up is 90-130 and about 2 hours after meal is 130-160  Please bring your blood sugar monitor to each visit, thank you

## 2015-05-27 ENCOUNTER — Other Ambulatory Visit: Payer: Self-pay | Admitting: *Deleted

## 2015-05-27 ENCOUNTER — Encounter: Payer: Self-pay | Admitting: Endocrinology

## 2015-05-27 MED ORDER — GLUCOSE BLOOD VI STRP: ORAL_STRIP | Status: DC

## 2015-05-27 MED ORDER — ONETOUCH DELICA LANCETS FINE MISC: Status: DC

## 2015-06-06 ENCOUNTER — Encounter: Payer: Self-pay | Admitting: Endocrinology

## 2015-06-07 ENCOUNTER — Telehealth: Payer: Self-pay | Admitting: *Deleted

## 2015-06-07 ENCOUNTER — Other Ambulatory Visit (INDEPENDENT_AMBULATORY_CARE_PROVIDER_SITE_OTHER): Payer: BLUE CROSS/BLUE SHIELD

## 2015-06-07 DIAGNOSIS — E291 Testicular hypofunction: Secondary | ICD-10-CM | POA: Diagnosis not present

## 2015-06-07 DIAGNOSIS — E1165 Type 2 diabetes mellitus with hyperglycemia: Secondary | ICD-10-CM | POA: Diagnosis not present

## 2015-06-07 LAB — BASIC METABOLIC PANEL
BUN: 19 mg/dL (ref 6–23)
CHLORIDE: 101 meq/L (ref 96–112)
CO2: 25 meq/L (ref 19–32)
Calcium: 9.4 mg/dL (ref 8.4–10.5)
Creatinine, Ser: 1.16 mg/dL (ref 0.40–1.50)
GFR: 85.25 mL/min (ref >60.00)
GLUCOSE: 123 mg/dL — AB (ref 70–99)
POTASSIUM: 3.9 meq/L (ref 3.5–5.1)
Sodium: 136 mEq/L (ref 135–145)

## 2015-06-07 LAB — MICROALBUMIN / CREATININE URINE RATIO
Creatinine,U: 144.6 mg/dL
MICROALB/CREAT RATIO: 0.5 mg/g (ref 0.0–30.0)
Microalb, Ur: <0.7 mg/dL (ref 0.0–1.9)

## 2015-06-07 LAB — TESTOSTERONE: TESTOSTERONE: 119.22 ng/dL — AB (ref 300.00–890.00)

## 2015-06-07 NOTE — Telephone Encounter (Signed)
Need testosterone level to see how the dose will be changed, any time of day

## 2015-06-07 NOTE — Telephone Encounter (Signed)
Patient came in today for his labs

## 2015-06-07 NOTE — Telephone Encounter (Signed)
Please see below  Clifford Kramer, Dr. Dwyane Dee prescribed me Androderm 4mg  over the Axiron, but Androderm is not made much of a change. I want stay with the patch. I just wanTed see if Dr.Kumar thinks I need a larger dose.

## 2015-06-08 ENCOUNTER — Other Ambulatory Visit: Payer: Self-pay | Admitting: *Deleted

## 2015-06-08 LAB — PROLACTIN: Prolactin: 9.9 ng/mL (ref 2.1–17.1)

## 2015-06-08 MED ORDER — TESTOSTERONE 4 MG/24HR TD PT24: MEDICATED_PATCH | TRANSDERMAL | Status: DC

## 2015-06-21 ENCOUNTER — Other Ambulatory Visit: Payer: Self-pay | Admitting: *Deleted

## 2015-06-21 ENCOUNTER — Encounter: Payer: Self-pay | Admitting: Endocrinology

## 2015-06-21 MED ORDER — TESTOSTERONE 4 MG/24HR TD PT24
MEDICATED_PATCH | TRANSDERMAL | Status: DC
Start: 2015-06-21 — End: 2015-08-22

## 2015-07-18 ENCOUNTER — Other Ambulatory Visit (INDEPENDENT_AMBULATORY_CARE_PROVIDER_SITE_OTHER): Payer: BLUE CROSS/BLUE SHIELD

## 2015-07-18 DIAGNOSIS — Z0000 Encounter for general adult medical examination without abnormal findings: Secondary | ICD-10-CM | POA: Diagnosis not present

## 2015-07-18 LAB — CBC WITH DIFFERENTIAL/PLATELET
BASOS PCT: 0.6 % (ref 0.0–3.0)
Basophils Absolute: 0 10*3/uL (ref 0.0–0.1)
EOS PCT: 2.3 % (ref 0.0–5.0)
Eosinophils Absolute: 0.2 10*3/uL (ref 0.0–0.7)
HCT: 43.9 % (ref 39.0–52.0)
HEMOGLOBIN: 14.4 g/dL (ref 13.0–17.0)
Lymphocytes Relative: 35.8 % (ref 12.0–46.0)
Lymphs Abs: 2.4 10*3/uL (ref 0.7–4.0)
MCHC: 32.9 g/dL (ref 30.0–36.0)
MCV: 80.5 fl (ref 78.0–100.0)
MONO ABS: 0.6 10*3/uL (ref 0.1–1.0)
Monocytes Relative: 8.9 % (ref 3.0–12.0)
NEUTROS ABS: 3.4 10*3/uL (ref 1.4–7.7)
Neutrophils Relative %: 52.4 % (ref 43.0–77.0)
PLATELETS: 275 10*3/uL (ref 150.0–400.0)
RBC: 5.45 Mil/uL (ref 4.22–5.81)
RDW: 14.5 % (ref 11.5–15.5)
WBC: 6.6 10*3/uL (ref 4.0–10.5)

## 2015-07-18 LAB — BASIC METABOLIC PANEL
BUN: 13 mg/dL (ref 6–23)
CHLORIDE: 102 meq/L (ref 96–112)
CO2: 29 meq/L (ref 19–32)
CREATININE: 1.3 mg/dL (ref 0.40–1.50)
Calcium: 9.5 mg/dL (ref 8.4–10.5)
GFR: 74.71 mL/min (ref >60.00)
GLUCOSE: 105 mg/dL — AB (ref 70–99)
POTASSIUM: 4.6 meq/L (ref 3.5–5.1)
Sodium: 140 mEq/L (ref 135–145)

## 2015-07-18 LAB — LIPID PANEL
CHOL/HDL RATIO: 3
Cholesterol: 103 mg/dL (ref 0–200)
HDL: 31.7 mg/dL — ABNORMAL LOW (ref >39.00)
LDL Cholesterol: 57 mg/dL (ref 0–99)
NONHDL: 71.35
Triglycerides: 71 mg/dL (ref 0.0–149.0)
VLDL: 14.2 mg/dL (ref 0.0–40.0)

## 2015-07-18 LAB — HEPATIC FUNCTION PANEL
ALT: 25 U/L (ref 0–53)
AST: 17 U/L (ref 0–37)
Albumin: 3.9 g/dL (ref 3.5–5.2)
Alkaline Phosphatase: 75 U/L (ref 39–117)
BILIRUBIN TOTAL: 0.5 mg/dL (ref 0.2–1.2)
Bilirubin, Direct: 0.1 mg/dL (ref 0.0–0.3)
TOTAL PROTEIN: 6.8 g/dL (ref 6.0–8.3)

## 2015-07-18 LAB — POCT URINALYSIS DIPSTICK
Glucose, UA: NEGATIVE
Leukocytes, UA: NEGATIVE
NITRITE UA: NEGATIVE
PH UA: 6.5
RBC UA: NEGATIVE
Spec Grav, UA: 1.025
Urobilinogen, UA: 2

## 2015-07-18 LAB — HEMOGLOBIN A1C: Hgb A1c MFr Bld: 6.9 % — ABNORMAL HIGH (ref 4.6–6.5)

## 2015-07-18 LAB — PSA: PSA: 0.29 ng/mL (ref 0.10–4.00)

## 2015-07-18 LAB — TSH: TSH: 2.2 u[IU]/mL (ref 0.35–4.50)

## 2015-07-18 LAB — MICROALBUMIN / CREATININE URINE RATIO
CREATININE, U: 440.3 mg/dL
Microalb Creat Ratio: 0.5 mg/g (ref 0.0–30.0)
Microalb, Ur: 2.1 mg/dL — ABNORMAL HIGH (ref 0.0–1.9)

## 2015-07-20 ENCOUNTER — Ambulatory Visit (INDEPENDENT_AMBULATORY_CARE_PROVIDER_SITE_OTHER): Payer: BLUE CROSS/BLUE SHIELD | Admitting: Family Medicine

## 2015-07-20 ENCOUNTER — Encounter: Payer: Self-pay | Admitting: Family Medicine

## 2015-07-20 VITALS — BP 122/80 | HR 98 | Temp 98.1°F | Ht 70.5 in | Wt 279.8 lb

## 2015-07-20 DIAGNOSIS — G4710 Hypersomnia, unspecified: Secondary | ICD-10-CM

## 2015-07-20 DIAGNOSIS — Z0000 Encounter for general adult medical examination without abnormal findings: Secondary | ICD-10-CM

## 2015-07-20 DIAGNOSIS — E1165 Type 2 diabetes mellitus with hyperglycemia: Secondary | ICD-10-CM

## 2015-07-20 DIAGNOSIS — F528 Other sexual dysfunction not due to a substance or known physiological condition: Secondary | ICD-10-CM

## 2015-07-20 DIAGNOSIS — G4733 Obstructive sleep apnea (adult) (pediatric): Secondary | ICD-10-CM

## 2015-07-20 DIAGNOSIS — I10 Essential (primary) hypertension: Secondary | ICD-10-CM | POA: Diagnosis not present

## 2015-07-20 DIAGNOSIS — G4730 Sleep apnea, unspecified: Secondary | ICD-10-CM

## 2015-07-20 DIAGNOSIS — M109 Gout, unspecified: Secondary | ICD-10-CM

## 2015-07-20 DIAGNOSIS — E785 Hyperlipidemia, unspecified: Secondary | ICD-10-CM

## 2015-07-20 DIAGNOSIS — K219 Gastro-esophageal reflux disease without esophagitis: Secondary | ICD-10-CM

## 2015-07-20 MED ORDER — FLUTICASONE PROPIONATE 50 MCG/ACT NA SUSP: NASAL | Status: DC

## 2015-07-20 MED ORDER — LISINOPRIL-HYDROCHLOROTHIAZIDE 20-25 MG PO TABS: 1.0000 | ORAL_TABLET | Freq: Every morning | ORAL | Status: DC

## 2015-07-20 MED ORDER — ALLOPURINOL 300 MG PO TABS: 300.0000 mg | ORAL_TABLET | Freq: Every day | ORAL | Status: DC

## 2015-07-20 MED ORDER — ESOMEPRAZOLE MAGNESIUM 40 MG PO CPDR: 40.0000 mg | DELAYED_RELEASE_CAPSULE | Freq: Every day | ORAL | Status: DC

## 2015-07-20 NOTE — Progress Notes (Signed)
Pre visit review using our clinic review tool, if applicable. No additional management support is needed unless otherwise documented below in the visit note. 

## 2015-07-20 NOTE — Progress Notes (Signed)
Subjective:    Patient ID: Clifford Kramer, male    DOB: [birth year], 49 y.o.   MRN: [id number]  HPI Toshiyuki is a 49 year old married male nonsmoker who comes in today for general physical examination because of a history of number of problems  He has a history of gout takes allopurinol 300 mg daily.  His underlying hypertension for which he takes Norvasc 5 mg, Zestoretic 20-25. BP today 122/80  He sees endocrinology for his diabetes. He's on a combination of Amaryl Victoza and metformin.  He also sees endocrinology because of erectile dysfunction and low testosterone. His testosterone level was under 100. He's now on 2 testosterone patches per endocrinology  He takes Zocor and aspirin daily for hyperlipidemia  He uses Flonase for allergic rhinitis Nexium for chronic reflux.  He has a history of obesity is class II. He saw Dr. Normajean Baxter about 4 years ago. His sleep apnea evaluation was borderline. His sleep dysfunction is gotten worse he would like to go back and be reevaluated. His symptoms are consistent with sleep apnea.  Vaccinations up-to-date  Social history........ he joined the [employer] and was in the first [place]. He also goes to the [place] yearly where he gets his Viagra for free   Review of Systems  Constitutional: Negative.   HENT: Negative.   Eyes: Negative.   Respiratory: Negative.   Cardiovascular: Negative.   Gastrointestinal: Negative.   Endocrine: Negative.   Genitourinary: Negative.   Musculoskeletal: Negative.   Skin: Negative.   Allergic/Immunologic: Negative.   Neurological: Negative.   Hematological: Negative.   Psychiatric/Behavioral: Negative.        Objective:   Physical Exam  Constitutional: He is oriented to person, place, and time. He appears well-developed and well-nourished.  HENT:  Head: Normocephalic and atraumatic.  Right Ear: External ear normal.  Left Ear: External ear normal.  Nose: Nose normal.  Mouth/Throat: Oropharynx is clear  and moist.  Eyes: Conjunctivae and EOM are normal. Pupils are equal, round, and reactive to light.  Neck: Normal range of motion. Neck supple. No JVD present. No tracheal deviation present. No thyromegaly present.  Large neck and fairly small airway consistent with sleep apnea  Cardiovascular: Normal rate, regular rhythm, normal heart sounds and intact distal pulses.  Exam reveals no gallop and no friction rub.   No murmur heard. No carotid nor aortic bruits peripheral pulses 2+ and symmetrical  Pulmonary/Chest: Effort normal and breath sounds normal. No stridor. No respiratory distress. He has no wheezes. He has no rales. He exhibits no tenderness.  Abdominal: Soft. Bowel sounds are normal. He exhibits no distension and no mass. There is no tenderness. There is no rebound and no guarding.  Genitourinary:  Genitourinary exam done by urology therefore not repeated  Musculoskeletal: Normal range of motion. He exhibits no edema or tenderness.  Lymphadenopathy:    He has no cervical adenopathy.  Neurological: He is alert and oriented to person, place, and time. He has normal reflexes. No cranial nerve deficit. He exhibits normal muscle tone.  Skin: Skin is warm and dry. No rash noted. No erythema. No pallor.  Psychiatric: He has a normal mood and affect. His behavior is normal. Judgment and thought content normal.  Nursing note and vitals reviewed.         Assessment & Plan:  Obesity............ as we have in the past recommended diet exercise and weight loss  Diabetes type 2.......... followed by endocrinology  Low testosterone........ followed by endocrinology  Hyperlipidemia..........Marland Kitchen  continue Zocor and aspirin  History gout,,,,,,,,,,,, continue allopurinol  Chronic reflux,,,,,,,, continue Nexium  Allergic rhinitis,,,,,,,,,, continue Flonase  Hypertension at goal continue current therapy  Sleep apnea a history...........Marland Kitchen reconsult with pulmonary

## 2015-07-20 NOTE — Patient Instructions (Signed)
Work hard this year on your diet exercise and weight loss............ carbohydrate free diet and walk 30 minutes daily  I will put in a pulmonary consult for evaluation of your sleep apnea  You may also contact the [facility] and see if you can see Dr. Normajean Baxter there since she saw him before  Tommi Rumps or Almyra Free or Dr. [place]

## 2015-07-26 ENCOUNTER — Encounter: Payer: BLUE CROSS/BLUE SHIELD | Admitting: Family Medicine

## 2015-07-26 ENCOUNTER — Encounter: Payer: Self-pay | Admitting: Endocrinology

## 2015-07-26 ENCOUNTER — Other Ambulatory Visit: Payer: Self-pay | Admitting: Endocrinology

## 2015-08-08 ENCOUNTER — Other Ambulatory Visit: Payer: Self-pay | Admitting: Family Medicine

## 2015-08-15 ENCOUNTER — Other Ambulatory Visit (INDEPENDENT_AMBULATORY_CARE_PROVIDER_SITE_OTHER): Payer: BLUE CROSS/BLUE SHIELD

## 2015-08-15 ENCOUNTER — Other Ambulatory Visit: Payer: Self-pay | Admitting: *Deleted

## 2015-08-15 DIAGNOSIS — F528 Other sexual dysfunction not due to a substance or known physiological condition: Secondary | ICD-10-CM

## 2015-08-15 DIAGNOSIS — E1169 Type 2 diabetes mellitus with other specified complication: Secondary | ICD-10-CM | POA: Diagnosis not present

## 2015-08-15 DIAGNOSIS — IMO0002 Reserved for concepts with insufficient information to code with codable children: Secondary | ICD-10-CM

## 2015-08-15 DIAGNOSIS — E1165 Type 2 diabetes mellitus with hyperglycemia: Secondary | ICD-10-CM | POA: Diagnosis not present

## 2015-08-15 LAB — LIPID PANEL
Cholesterol: 102 mg/dL (ref 0–200)
HDL: 33.7 mg/dL — AB (ref >39.00)
LDL CALC: 57 mg/dL (ref 0–99)
NONHDL: 68.62
Total CHOL/HDL Ratio: 3
Triglycerides: 56 mg/dL (ref 0.0–149.0)
VLDL: 11.2 mg/dL (ref 0.0–40.0)

## 2015-08-15 LAB — BASIC METABOLIC PANEL
BUN: 12 mg/dL (ref 6–23)
CO2: 22 mEq/L (ref 19–32)
Calcium: 9.4 mg/dL (ref 8.4–10.5)
Chloride: 104 mEq/L (ref 96–112)
Creatinine, Ser: 1.12 mg/dL (ref 0.40–1.50)
GFR: 88.71 mL/min (ref >60.00)
GLUCOSE: 133 mg/dL — AB (ref 70–99)
Potassium: 4.4 mEq/L (ref 3.5–5.1)
Sodium: 140 mEq/L (ref 135–145)

## 2015-08-15 LAB — TESTOSTERONE: TESTOSTERONE: 690.57 ng/dL (ref 300.00–890.00)

## 2015-08-22 ENCOUNTER — Other Ambulatory Visit: Payer: Self-pay | Admitting: *Deleted

## 2015-08-22 ENCOUNTER — Encounter: Payer: Self-pay | Admitting: Endocrinology

## 2015-08-22 ENCOUNTER — Ambulatory Visit (INDEPENDENT_AMBULATORY_CARE_PROVIDER_SITE_OTHER): Payer: BLUE CROSS/BLUE SHIELD | Admitting: Endocrinology

## 2015-08-22 VITALS — BP 124/78 | HR 82 | Temp 99.0°F | Resp 16 | Ht 70.5 in | Wt 281.0 lb

## 2015-08-22 DIAGNOSIS — I10 Essential (primary) hypertension: Secondary | ICD-10-CM | POA: Diagnosis not present

## 2015-08-22 DIAGNOSIS — E291 Testicular hypofunction: Secondary | ICD-10-CM

## 2015-08-22 DIAGNOSIS — E1165 Type 2 diabetes mellitus with hyperglycemia: Secondary | ICD-10-CM

## 2015-08-22 MED ORDER — TESTOSTERONE 4 MG/24HR TD PT24: MEDICATED_PATCH | TRANSDERMAL | Status: DC

## 2015-08-22 MED ORDER — CANAGLIFLOZIN 100 MG PO TABS: ORAL_TABLET | ORAL | Status: DC

## 2015-08-22 MED ORDER — EMPAGLIFLOZIN 10 MG PO TABS: 10.0000 mg | ORAL_TABLET | Freq: Every day | ORAL | Status: DC

## 2015-08-22 MED ORDER — TESTOSTERONE 2 MG/24HR TD PT24: MEDICATED_PATCH | TRANSDERMAL | Status: DC

## 2015-08-22 NOTE — Patient Instructions (Signed)
Take Invokana on waking up   Glimperide 1/2 before supper and if am sugar <90 then stop  Cut the Lisinopril in 1/2  Androderm 2+4mg  daily

## 2015-08-22 NOTE — Progress Notes (Signed)
Patient ID: Clifford Kramer, male   DOB: September 26, 1965, 50 y.o.   MRN: [id number]   Reason for Appointment : Followup for Type 2 Diabetes  History of Present Illness          Diagnosis: Type 2 diabetes mellitus, date of diagnosis: 2005     Background information: He was probably started on metformin at the time of diagnosis when his blood sugars were not very high About 5-6 years ago because of higher blood sugars he was also given glipizide  He appeared to have  progression of his diabetes with A1c going up to 8.1% in 01/2013; also had difficulty with losing weight before he was started on Victoza.  With this his blood sugars were significantly better His glipizide was reduced to 2.5 mg in 10/14 because of rare hypoglycemia He has taken 1.8 mg of Victoza since 7/15   RECENT history:   Antihyperglycemic drugs the patient is taking are: Victoza 1.8 mg, Metformin 2 g and Amaryl 2 mg pcs   He continues to work night shifts  Although his A1c is 6.9 in 12/16 his blood sugars are fluctuating significantly at various times  His medications were not changed on the last visit  Current blood sugar patterns and problems:  He has been checking blood sugars when he comes back from work in the morning and sometimes before or after supper but not clear which days he is working or off.  He has occasional very significant hyperglycemia with blood sugars as high as 332 after coming back from work for no apparent reason  He probably has high readings in the mornings mostly including today resting  Blood sugars before suppertime are relatively better recently  He still not losing weight   He is taking his Amaryl after supper rather than before eating in the evening   Side effects from medications have been: None       Monitors blood glucose:  infrequently.         Glucometer: One Touch Verio     Blood Glucose readings   Mean values apply above for all meters except median for One  Touch  PRE-MEAL Fasting Lunch Dinner  PC S?   Overall  Glucose range:  124-175   113-198   202-240    Mean/median:      152+/-54    Hypoglycemia: once as above            LIFESTYLE: Meals: 3 meals per day. Dinner 6 pm, Bfst 7 am; lunch 12 am;  eating out periodically His breakfast maybe the cereal or eggs and toast and meat  Usually eating fruit and sandwich at lunch, usually a grilled meat at suppertime   Physical activity: exercise: 3-4/7 days a week, at gym or walking, 90 min          Dietician visit: 05/2013           Wt Readings from Last 3 Encounters:  08/22/15 281 lb (127.461 kg)  07/20/15 279 lb 12.8 oz (126.916 kg)  05/23/15 279 lb 9.6 oz (126.826 kg)   Lab Results  Component Value Date   HGBA1C 6.9* 07/18/2015   HGBA1C 6.8* 05/16/2015   HGBA1C 6.5 02/21/2015   Lab Results  Component Value Date   MICROALBUR 2.1* 07/18/2015   LDLCALC 57 08/15/2015   CREATININE 1.12 08/15/2015        Medication List       This list is accurate as of: 08/22/15 10:11 AM.  Always use your most recent med list.               allopurinol 300 MG tablet  Commonly known as:  ZYLOPRIM  Take 1 tablet (300 mg total) by mouth daily.     amLODipine 5 MG tablet  Commonly known as:  NORVASC  Take 1 tablet (5 mg total) by mouth daily.     aspirin 81 MG tablet  Take 81 mg by mouth daily.     BD PEN NEEDLE NANO U/F 32G X 4 MM Misc  Generic drug:  Insulin Pen Needle  USE EVERY DAY     benzonatate 100 MG capsule  Commonly known as:  TESSALON  Take 1 capsule (100 mg total) by mouth every 8 (eight) hours.     canagliflozin 100 MG Tabs tablet  Commonly known as:  INVOKANA  1 tablet before breakfast     doxycycline 100 MG capsule  Commonly known as:  VIBRAMYCIN  Take 1 capsule (100 mg total) by mouth 2 (two) times daily. For 10 days     esomeprazole 40 MG capsule  Commonly known as:  NEXIUM  Take 1 capsule (40 mg total) by mouth daily at 12 noon.     fluticasone 50 MCG/ACT  nasal spray  Commonly known as:  FLONASE  PLACE 2 SPRAYS INTO THE NOSE DAILY.     glimepiride 2 MG tablet  Commonly known as:  AMARYL  TAKE 1 TABLET (2 MG TOTAL) BY MOUTH DAILY BEFORE SUPPER.     glucose blood test strip  Commonly known as:  ONE TOUCH ULTRA TEST  Use as instructed to check blood sugar two times a day dx code E11.9     lisinopril-hydrochlorothiazide 20-25 MG tablet  Commonly known as:  PRINZIDE,ZESTORETIC  Take 1 tablet by mouth every morning.     metFORMIN 1000 MG tablet  Commonly known as:  GLUCOPHAGE  TAKE 1 TABLET BY MOUTH TWO TIMES A DAY     ONETOUCH DELICA LANCETS FINE Misc  Use to check blood sugar 2 times per day, dx code E11.9     ONETOUCH VERIO IQ SYSTEM w/Device Kit  Use to check blood sugar 2 times daily     simvastatin 20 MG tablet  Commonly known as:  ZOCOR  TAKE 1 TABLET (20 MG TOTAL) BY MOUTH AT BEDTIME.     testosterone 4 MG/24HR Pt24 patch  Commonly known as:  ANDRODERM  Use two patches per day     VIAGRA 100 MG tablet  Generic drug:  sildenafil     VICTOZA 18 MG/3ML Sopn  Generic drug:  Liraglutide  INJECT 1.8 MG UNDER THE SKIN DAILY.        Allergies:  Allergies  Allergen Reactions  . Celebrex [Celecoxib] Itching    REACTION: itching  . Penicillins Itching  . Sulfonamide Derivatives Itching    REACTION: itching    Past Medical History  Diagnosis Date  . Diabetes mellitus   . Hypertension   . Hyperlipidemia   . Gout     Past Surgical History  Procedure Laterality Date  . Knee surgery Left 2005    Family History  Problem Relation Age of Onset  . Diabetes Mother   . Diabetes Maternal Aunt   . Diabetes Maternal Grandmother   . Heart disease Neg Hx   . Colon cancer Neg Hx   . Esophageal cancer Neg Hx   . Rectal cancer Neg Hx   . Stomach cancer Other  Social History:  reports that he has never smoked. He has never used smokeless tobacco. He reports that he drinks alcohol. He reports that he does not use  illicit drugs.    Review of Systems   HYPERTENSION: This is well controlled with 2 drugs including Norvasc  He is also on  Zestoretic 20/25 mg     Hyperlipidemia: LDL has been controlled, baseline previously 109, has been tolerating simvastatin. Has a low HDL  Lab Results  Component Value Date   CHOL 102 08/15/2015   HDL 33.70* 08/15/2015   LDLCALC 57 08/15/2015   TRIG 56.0 08/15/2015   CHOLHDL 3 08/15/2015       He has had a history of erectile dysfunction and uses Levitra as needed     He has been on testosterone supplements since 2011 when his level was mildly low around 300  but no further evaluation done.   He has a relatively low level on using Axiron and had difficulty with the application With starting Androderm 8 mg daily he feels better and his level has gone up significantly, previously has been persistently low He feels less tired He does have some itching and skin discoloration with the patches  Lab Results  Component Value Date   TESTOSTERONE 690.57 08/15/2015     Urine microalbumin normal in 1/16  LABS:  No visits with results within 1 Week(s) from this visit. Latest known visit with results is:  Lab on 08/15/2015  Component Date Value Ref Range Status  . Sodium 08/15/2015 140  135 - 145 mEq/L Final  . Potassium 08/15/2015 4.4  3.5 - 5.1 mEq/L Final  . Chloride 08/15/2015 104  96 - 112 mEq/L Final  . CO2 08/15/2015 22  19 - 32 mEq/L Final  . Glucose, Bld 08/15/2015 133* 70 - 99 mg/dL Final  . BUN 08/15/2015 12  6 - 23 mg/dL Final  . Creatinine, Ser 08/15/2015 1.12  0.40 - 1.50 mg/dL Final  . Calcium 08/15/2015 9.4  8.4 - 10.5 mg/dL Final  . GFR 08/15/2015 88.71  >60.00 mL/min Final  . Cholesterol 08/15/2015 102  0 - 200 mg/dL Final   ATP III Classification       Desirable:  < 200 mg/dL               Borderline High:  200 - 239 mg/dL          High:  > = 240 mg/dL  . Triglycerides 08/15/2015 56.0  0.0 - 149.0 mg/dL Final   Normal:  <150  mg/dLBorderline High:  150 - 199 mg/dL  . HDL 08/15/2015 33.70* >39.00 mg/dL Final  . VLDL 08/15/2015 11.2  0.0 - 40.0 mg/dL Final  . LDL Cholesterol 08/15/2015 57  0 - 99 mg/dL Final  . Total CHOL/HDL Ratio 08/15/2015 3   Final                  Men          Women1/2 Average Risk     3.4          3.3Average Risk          5.0          4.42X Average Risk          9.6          7.13X Average Risk          15.0          11.0                      .  NonHDL 08/15/2015 68.62   Final   NOTE:  Non-HDL goal should be 30 mg/dL higher than patient's LDL goal (i.e. LDL goal of < 70 mg/dL, would have non-HDL goal of < 100 mg/dL)  . Testosterone 08/15/2015 690.57  300.00 - 890.00 ng/dL Final     Physical Examination:  BP 124/78 mmHg  Pulse 82  Temp(Src) 99 F (37.2 C)  Resp 16  Ht 5' 10.5" (1.791 m)  Wt 281 lb (127.461 kg)  BMI 39.74 kg/m2  SpO2 97%        ASSESSMENT/PLAN:   Diabetes type 2, fair control, BMI 39 See history of present illness for detailed discussion of his current management, blood sugar patterns and problems identified His blood sugars are fluctuating significantly and not clear why He has some variability in his diet and his work schedule Also not exercising as much recently Not benefiting from Amaryl at suppertime because he takes it 3 hours after eating Again has difficulty losing weight  He is probably a good candidate for using Invokana Discussed action of SGLT 2 drugs on lowering glucose by decreasing kidney absorption of glucose, benefits of weight loss and lower blood pressure, possible side effects including candidiasis and dosage regimen   With this he was be able to reduce glimepiride dosage at least to 1 mg Also will reduce his Zestoretic to half He will try to check more readings consistently 2 hours after meals Consultation with dietitian He will try to add walking to his regimen when he cannot go to the gym Discussed blood sugar targets at various  times  HYPOGONADISM: He does have much better levels with the Androderm Testosterone level is high normal with 8 mg and will reduce the dose to 6 mg He will use hydrocortisone to provide locally rotation To recheck testosterone level on the next visit  HYPERTENSION:  His blood pressure is controlled  Counseling time on subjects discussed above is over 50% of today's 25 minute visit   Consuelo Thayne 08/22/2015, 10:11 AM

## 2015-08-24 ENCOUNTER — Encounter: Payer: Self-pay | Admitting: Endocrinology

## 2015-08-24 ENCOUNTER — Telehealth: Payer: Self-pay | Admitting: *Deleted

## 2015-08-24 NOTE — Telephone Encounter (Signed)
Noted, patient is aware. 

## 2015-08-24 NOTE — Telephone Encounter (Signed)
Yes

## 2015-08-24 NOTE — Telephone Encounter (Signed)
Patient wants to know if he is to continue taking his Victoza with the Jardiance? Please advise.

## 2015-09-02 ENCOUNTER — Other Ambulatory Visit: Payer: Self-pay | Admitting: Endocrinology

## 2015-09-23 ENCOUNTER — Other Ambulatory Visit: Payer: Self-pay | Admitting: *Deleted

## 2015-09-23 MED ORDER — GLUCOSE BLOOD VI STRP: ORAL_STRIP | Status: DC

## 2015-10-04 ENCOUNTER — Other Ambulatory Visit: Payer: Self-pay | Admitting: Endocrinology

## 2015-10-14 ENCOUNTER — Other Ambulatory Visit: Payer: Self-pay | Admitting: *Deleted

## 2015-10-14 MED ORDER — EMPAGLIFLOZIN 10 MG PO TABS
10.0000 mg | ORAL_TABLET | Freq: Every day | ORAL | Status: DC
Start: 2015-10-14 — End: 2015-12-16

## 2015-10-14 MED ORDER — EMPAGLIFLOZIN 10 MG PO TABS: 10.0000 mg | ORAL_TABLET | Freq: Every day | ORAL | Status: DC

## 2015-10-23 ENCOUNTER — Other Ambulatory Visit: Payer: Self-pay | Admitting: Endocrinology

## 2015-10-25 ENCOUNTER — Other Ambulatory Visit: Payer: Self-pay | Admitting: Endocrinology

## 2015-11-14 ENCOUNTER — Other Ambulatory Visit: Payer: BLUE CROSS/BLUE SHIELD

## 2015-11-21 ENCOUNTER — Ambulatory Visit: Payer: BLUE CROSS/BLUE SHIELD | Admitting: Endocrinology

## 2015-11-28 ENCOUNTER — Other Ambulatory Visit: Payer: BLUE CROSS/BLUE SHIELD

## 2015-11-28 ENCOUNTER — Other Ambulatory Visit (INDEPENDENT_AMBULATORY_CARE_PROVIDER_SITE_OTHER): Payer: BLUE CROSS/BLUE SHIELD

## 2015-11-28 DIAGNOSIS — E291 Testicular hypofunction: Secondary | ICD-10-CM

## 2015-11-28 DIAGNOSIS — E1165 Type 2 diabetes mellitus with hyperglycemia: Secondary | ICD-10-CM | POA: Diagnosis not present

## 2015-11-28 LAB — BASIC METABOLIC PANEL
BUN: 20 mg/dL (ref 6–23)
CHLORIDE: 100 meq/L (ref 96–112)
CO2: 25 meq/L (ref 19–32)
Calcium: 9.6 mg/dL (ref 8.4–10.5)
Creatinine, Ser: 1.39 mg/dL (ref 0.40–1.50)
GFR: 69.06 mL/min (ref >60.00)
Glucose, Bld: 125 mg/dL — ABNORMAL HIGH (ref 70–99)
Potassium: 4.4 mEq/L (ref 3.5–5.1)
SODIUM: 135 meq/L (ref 135–145)

## 2015-11-28 LAB — TESTOSTERONE: Testosterone: 177.56 ng/dL — ABNORMAL LOW (ref 300.00–890.00)

## 2015-11-28 LAB — HEMOGLOBIN A1C: HEMOGLOBIN A1C: 7.1 % — AB (ref 4.6–6.5)

## 2015-12-01 ENCOUNTER — Encounter: Payer: Self-pay | Admitting: Endocrinology

## 2015-12-01 ENCOUNTER — Ambulatory Visit (INDEPENDENT_AMBULATORY_CARE_PROVIDER_SITE_OTHER): Payer: BLUE CROSS/BLUE SHIELD | Admitting: Endocrinology

## 2015-12-01 VITALS — BP 128/74 | HR 106 | Temp 98.9°F | Ht 71.0 in | Wt 274.0 lb

## 2015-12-01 DIAGNOSIS — E1165 Type 2 diabetes mellitus with hyperglycemia: Secondary | ICD-10-CM

## 2015-12-01 DIAGNOSIS — E291 Testicular hypofunction: Secondary | ICD-10-CM | POA: Diagnosis not present

## 2015-12-01 NOTE — Patient Instructions (Addendum)
Jardiance 2 tabs of 10 Glimeperide 1/2 tab at supper, when sugars <90  Lisinopril 1/2 only  Check blood sugars on waking up 2  times a week Also check blood sugars about 2 hours after a meal and do this after different meals by rotation  Recommended blood sugar levels on waking up is 90-130 and about 2 hours after meal is 130-160  Please bring your blood sugar monitor to each visit, thank you

## 2015-12-01 NOTE — Progress Notes (Signed)
Patient ID: Clifford Kramer, male   DOB: 08/10/1965, 50 y.o.   MRN: [id number]   Reason for Appointment : Followup for Type 2 Diabetes  History of Present Illness          Diagnosis: Type 2 diabetes mellitus, date of diagnosis: 2005     Background information: He was probably started on metformin at the time of diagnosis when his blood sugars were not very high About 5-6 years ago because of higher blood sugars he was also given glipizide  He appeared to have  progression of his diabetes with A1c going up to 8.1% in 01/2013; also had difficulty with losing weight before he was started on Victoza.  With this his blood sugars were significantly better His glipizide was reduced to 2.5 mg in 10/14 because of rare hypoglycemia He has taken 1.8 mg of Victoza since 7/15   RECENT history:   Antihyperglycemic drugs the patient is taking are: Victoza 1.8 mg, Metformin 2 g and Amaryl 2 mg acs, Jardiance 10 mg daily    Although he has gone on Jardiance his A1c appears to be about the same at 7.1, previously his A1c is 6.9 in 12/16  Current management, blood sugar patterns and problems:  He has relatively better readings on his monitor download compared to last visit especially less increases in blood sugars after his evening meal, now taking his Amaryl with his supper instead of after  No side effects from [place] and he has lost 7 pounds  He thinks he can be more consistent with his diet, this may be the reason why his sugars do fluctuate sometimes overnight and also some sporadic high readings after evening meal especially if he is checking readings about 1 hour later  He also thinks that eating cereal in the morning may raise his blood sugar   Blood sugars may be around normal range in the middle of the day sometimes  He does try to stay active and even try to walk at work  No hypoglycemia   Side effects from medications have been: None       Monitors blood glucose:   infrequently.         Glucometer: One Touch Verio     Blood Glucose readings   Mean values apply above for all meters except median for One Touch  PRE-MEAL Fasting Lunch Dinner Bedtime Overall  Glucose range: 100-184  239  94-124  171, 193    Mean/median:     129           LIFESTYLE: Meals: 3 meals per day. Dinner 6 pm, Bfst 7 am; lunch 12 am;  eating out periodically His breakfast maybe the cereal or eggs and toast and meat  Usually eating fruit and sandwich at lunch, usually a grilled meat at suppertime   Physical activity: exercise: 3-4/7 days a week, at gym or walking, 90 min          Dietician visit: 05/2013           Wt Readings from Last 3 Encounters:  12/01/15 274 lb (124.286 kg)  08/22/15 281 lb (127.461 kg)  07/20/15 279 lb 12.8 oz (126.916 kg)   Lab Results  Component Value Date   HGBA1C 7.1* 11/28/2015   HGBA1C 6.9* 07/18/2015   HGBA1C 6.8* 05/16/2015   Lab Results  Component Value Date   MICROALBUR 2.1* 07/18/2015   LDLCALC 57 08/15/2015   CREATININE 1.39 11/28/2015  Medication List       This list is accurate as of: 12/01/15 11:59 PM.  Always use your most recent med list.               allopurinol 300 MG tablet  Commonly known as:  ZYLOPRIM  Take 1 tablet (300 mg total) by mouth daily.     amLODipine 5 MG tablet  Commonly known as:  NORVASC  Take 1 tablet (5 mg total) by mouth daily.     aspirin 81 MG tablet  Take 81 mg by mouth daily.     BD PEN NEEDLE NANO U/F 32G X 4 MM Misc  Generic drug:  Insulin Pen Needle  USE EVERY DAY     canagliflozin 100 MG Tabs tablet  Commonly known as:  INVOKANA  1 tablet before breakfast     empagliflozin 10 MG Tabs tablet  Commonly known as:  JARDIANCE  Take 10 mg by mouth daily.     esomeprazole 40 MG capsule  Commonly known as:  NEXIUM  Take 1 capsule (40 mg total) by mouth daily at 12 noon.     fluticasone 50 MCG/ACT nasal spray  Commonly known as:  FLONASE  PLACE 2 SPRAYS INTO THE NOSE  DAILY.     glimepiride 2 MG tablet  Commonly known as:  AMARYL  TAKE 1 TABLET (2 MG TOTAL) BY MOUTH DAILY BEFORE SUPPER.     glucose blood test strip  Commonly known as:  ONE TOUCH ULTRA TEST  Use as instructed to check blood sugar two times a day dx code E11.9     lisinopril-hydrochlorothiazide 20-25 MG tablet  Commonly known as:  PRINZIDE,ZESTORETIC  Take 1 tablet by mouth every morning.     metFORMIN 1000 MG tablet  Commonly known as:  GLUCOPHAGE  TAKE 1 TABLET BY MOUTH TWO TIMES A DAY     ONETOUCH DELICA LANCETS FINE Misc  Use to check blood sugar 2 times per day, dx code E11.9     ONETOUCH VERIO IQ SYSTEM w/Device Kit  Use to check blood sugar 2 times daily     simvastatin 20 MG tablet  Commonly known as:  ZOCOR  TAKE 1 TABLET (20 MG TOTAL) BY MOUTH AT BEDTIME.     testosterone 4 MG/24HR Pt24 patch  Commonly known as:  ANDRODERM  Use one patch per day with a 2 mg patch     Testosterone 2 MG/24HR Pt24  Commonly known as:  ANDRODERM  Use one patch per day with a 4 mg patch (total dose= 6 mg)     VIAGRA 100 MG tablet  Generic drug:  sildenafil     VICTOZA 18 MG/3ML Sopn  Generic drug:  Liraglutide  INJECT 1.8 MG UNDER THE SKIN DAILY.        Allergies:  Allergies  Allergen Reactions  . Celebrex [Celecoxib] Itching    REACTION: itching  . Penicillins Itching  . Sulfonamide Derivatives Itching    REACTION: itching    Past Medical History  Diagnosis Date  . Diabetes mellitus   . Hypertension   . Hyperlipidemia   . Gout     Past Surgical History  Procedure Laterality Date  . Knee surgery Left 2005    Family History  Problem Relation Age of Onset  . Diabetes Mother   . Diabetes Maternal Aunt   . Diabetes Maternal Grandmother   . Heart disease Neg Hx   . Colon cancer Neg Hx   . Esophageal cancer  Neg Hx   . Rectal cancer Neg Hx   . Stomach cancer Other     Social History:  reports that he has never smoked. He has never used smokeless tobacco.  He reports that he drinks alcohol. He reports that he does not use illicit drugs.    Review of Systems   HYPERTENSION: This is well controlled with 2 drugs including Norvasc  He is also on  Zestoretic 20/25 mg, did not reduce this to half tablet with starting Jardiance as directed     Hyperlipidemia: LDL has been controlled, baseline previously 109, has been tolerating simvastatin. Has a low HDL  Lab Results  Component Value Date   CHOL 102 08/15/2015   HDL 33.70* 08/15/2015   LDLCALC 57 08/15/2015   TRIG 56.0 08/15/2015   CHOLHDL 3 08/15/2015       He has had a history of erectile dysfunction and uses Levitra as needed     He has been on testosterone supplements since 2011 when his level was mildly low around 300  but no further evaluation done.   He has a relatively low level on using Axiron and had difficulty with the application With starting Androderm at therapeutic doses he had improved testosterone levels and less fatigue  He feels slightly tired at times and may at times have difficulty with sustaining erections His testosterone level is markedly low but after his thinking and remembering back he may have forgotten his patches for a day or 2 over the weekend prior to his lab work. With 8 mg Androderm his level was upper normal and now it is below 200, currently taking 6 mg total   Lab Results  Component Value Date   TESTOSTERONE 177.56* 11/28/2015     LABS:  Appointment on 11/28/2015  Component Date Value Ref Range Status  . Hgb A1c MFr Bld 11/28/2015 7.1* 4.6 - 6.5 % Final   Glycemic Control Guidelines for People with Diabetes:Non Diabetic:  <6%Goal of Therapy: <7%Additional Action Suggested:  >8%   . Sodium 11/28/2015 135  135 - 145 mEq/L Final  . Potassium 11/28/2015 4.4  3.5 - 5.1 mEq/L Final  . Chloride 11/28/2015 100  96 - 112 mEq/L Final  . CO2 11/28/2015 25  19 - 32 mEq/L Final  . Glucose, Bld 11/28/2015 125* 70 - 99 mg/dL Final  . BUN 11/28/2015 20  6  - 23 mg/dL Final  . Creatinine, Ser 11/28/2015 1.39  0.40 - 1.50 mg/dL Final  . Calcium 11/28/2015 9.6  8.4 - 10.5 mg/dL Final  . GFR 11/28/2015 69.06  >60.00 mL/min Final  . Testosterone 11/28/2015 177.56* 300.00 - 890.00 ng/dL Final     Physical Examination:  BP 128/74 mmHg  Pulse 106  Temp(Src) 98.9 F (37.2 C) (Oral)  Ht 5' 11" (1.803 m)  Wt 274 lb (124.286 kg)  BMI 38.23 kg/m2  SpO2 95%        ASSESSMENT/PLAN:   Diabetes type 2, fair control, BMI 39 See history of present illness for detailed discussion of his current management, blood sugar patterns and problems identified His blood sugars are fluctuating has not had as many postprandial hyperglycemic values Continues to take Amaryl also and doing better with taking it at suppertime instead of later He has started to losing a little weight He thinks he can do better on diet and wants to try it on his own and does not see the dietitian as suggested today  No side effects from [facility] and since  he may benefit from higher dose he will increase the dose to 25 mg  With this he should be able to reduce glimepiride dosage at least to 1 mg based on his blood sugars and this was discussed  Also will reduce his Zestoretic to half He will try to check more readings consistently 2 hours after meals  HYPOGONADISM: He is having a low level on this visit and this is likely to be from his forgetting the patches on the weekend prior to his labs Although he is slightly more tired and has some issues with ED this is unlikely to be indicating hypogonadism He will try to be more consistent with his patches and have another level checked on the next visit  HYPERTENSION:  His blood pressure is controlled, as a precaution will reduce his Zestoretic with increasing Jardiance   Total visit time for review of multiple problems, labs, medication review, counseling = 25 minute   Patient Instructions  Jardiance 2 tabs of 10 Glimeperide 1/2  tab at supper, when sugars <90  Lisinopril 1/2 only  Check blood sugars on waking up 2  times a week Also check blood sugars about 2 hours after a meal and do this after different meals by rotation  Recommended blood sugar levels on waking up is 90-130 and about 2 hours after meal is 130-160  Please bring your blood sugar monitor to each visit, thank you       [facility] 12/02/2015, 7:57 AM

## 2015-12-13 ENCOUNTER — Other Ambulatory Visit: Payer: Self-pay | Admitting: Endocrinology

## 2015-12-15 ENCOUNTER — Encounter: Payer: Self-pay | Admitting: Endocrinology

## 2015-12-16 ENCOUNTER — Other Ambulatory Visit: Payer: Self-pay

## 2015-12-16 MED ORDER — EMPAGLIFLOZIN 10 MG PO TABS: 10.0000 mg | ORAL_TABLET | Freq: Two times a day (BID) | ORAL | Status: DC

## 2015-12-17 ENCOUNTER — Other Ambulatory Visit: Payer: Self-pay | Admitting: Endocrinology

## 2016-01-09 ENCOUNTER — Other Ambulatory Visit (INDEPENDENT_AMBULATORY_CARE_PROVIDER_SITE_OTHER): Payer: BLUE CROSS/BLUE SHIELD

## 2016-01-09 ENCOUNTER — Other Ambulatory Visit: Payer: BLUE CROSS/BLUE SHIELD

## 2016-01-09 DIAGNOSIS — E291 Testicular hypofunction: Secondary | ICD-10-CM | POA: Diagnosis not present

## 2016-01-09 DIAGNOSIS — E1165 Type 2 diabetes mellitus with hyperglycemia: Secondary | ICD-10-CM | POA: Diagnosis not present

## 2016-01-09 LAB — BASIC METABOLIC PANEL
BUN: 16 mg/dL (ref 6–23)
CHLORIDE: 103 meq/L (ref 96–112)
CO2: 27 meq/L (ref 19–32)
Calcium: 9.6 mg/dL (ref 8.4–10.5)
Creatinine, Ser: 1.36 mg/dL (ref 0.40–1.50)
GFR: 70.79 mL/min (ref >60.00)
GLUCOSE: 93 mg/dL (ref 70–99)
POTASSIUM: 3.8 meq/L (ref 3.5–5.1)
Sodium: 139 mEq/L (ref 135–145)

## 2016-01-09 LAB — TESTOSTERONE: Testosterone: 349.92 ng/dL (ref 300.00–890.00)

## 2016-01-10 LAB — FRUCTOSAMINE: Fructosamine: 254 umol/L (ref 0–285)

## 2016-01-16 ENCOUNTER — Other Ambulatory Visit: Payer: Self-pay

## 2016-01-16 ENCOUNTER — Ambulatory Visit (INDEPENDENT_AMBULATORY_CARE_PROVIDER_SITE_OTHER): Payer: BLUE CROSS/BLUE SHIELD | Admitting: Endocrinology

## 2016-01-16 ENCOUNTER — Encounter: Payer: Self-pay | Admitting: Endocrinology

## 2016-01-16 VITALS — BP 132/80 | HR 77 | Ht 71.0 in | Wt 271.0 lb

## 2016-01-16 DIAGNOSIS — E291 Testicular hypofunction: Secondary | ICD-10-CM | POA: Diagnosis not present

## 2016-01-16 DIAGNOSIS — E1165 Type 2 diabetes mellitus with hyperglycemia: Secondary | ICD-10-CM | POA: Diagnosis not present

## 2016-01-16 MED ORDER — EMPAGLIFLOZIN 25 MG PO TABS: 25.0000 mg | ORAL_TABLET | Freq: Every day | ORAL | Status: DC

## 2016-01-16 MED ORDER — GLIMEPIRIDE 1 MG PO TABS: 1.0000 mg | ORAL_TABLET | Freq: Every day | ORAL | Status: DC

## 2016-01-16 NOTE — Progress Notes (Signed)
Patient ID: Clifford Kramer, male   DOB: 01-Dec-1965, 50 y.o.   MRN: [id number]   Reason for Appointment : Followup for Type 2 Diabetes  History of Present Illness          Diagnosis: Type 2 diabetes mellitus, date of diagnosis: 2005     Background information: He was probably started on metformin at the time of diagnosis when his blood sugars were not very high About 5-6 years ago because of higher blood sugars he was also given glipizide  He appeared to have  progression of his diabetes with A1c going up to 8.1% in 01/2013; also had difficulty with losing weight before he was started on Victoza.  With this his blood sugars were significantly better His glipizide was reduced to 2.5 mg in 10/14 because of rare hypoglycemia He has taken 1.8 mg of Victoza since 7/15   RECENT history:   Antihyperglycemic drugs : Victoza 1.8 mg, Metformin 2 g and Amaryl 1 mg acs, Jardiance 10 mg daily    Works 1 am to 1 pm, sleeps till 10 pm  Current management, blood sugar patterns and problems:  He was told to increase Jardiance to 2 tablets daily on his last visit because of periodically high blood sugars and difficulty with getting A1c below 7  He did not bring his monitor for download today  He thinks his blood sugars are better since his last visit and increasing Jardiance, only last night his sugar was over 200 right after eating a hamburger and french fries  FRUCTOSAMINE is improved at 254  He does try to stay active and even try to walk at work  No hypoglycemia   Side effects from medications have been: None       Monitors blood glucose:  infrequently.         Glucometer: One Touch Verio     Blood Glucose readings by recall:  Mean values apply above for all meters except median for One Touch  PRE-MEAL Fasting Lunch Dinner Bedtime Overall  Glucose range: 130s   145 91-145  Mean/median:                  LIFESTYLE: Meals: 3 meals per day. Dinner 6 pm, Bfst 7 am; lunch 12 am;   eating out periodically His breakfast maybe the cereal or eggs and toast and meat  Usually eating fruit and sandwich at lunch, usually a grilled meat at suppertime   Physical activity: exercise: 3-4/7 days a week, at gym or walking, 90 min          Dietician visit: 05/2013           Wt Readings from Last 3 Encounters:  01/16/16 271 lb (122.925 kg)  12/01/15 274 lb (124.286 kg)  08/22/15 281 lb (127.461 kg)   Lab Results  Component Value Date   HGBA1C 7.1* 11/28/2015   HGBA1C 6.9* 07/18/2015   HGBA1C 6.8* 05/16/2015   Lab Results  Component Value Date   MICROALBUR 2.1* 07/18/2015   LDLCALC 57 08/15/2015   CREATININE 1.36 01/09/2016     No visits with results within 1 Week(s) from this visit. Latest known visit with results is:  Lab on 01/09/2016  Component Date Value Ref Range Status  . Fructosamine 01/09/2016 254  0 - 285 umol/L Final   Comment: Published reference interval for apparently healthy subjects between age 66 and 80 is 41 - 285 umol/L and in a poorly controlled diabetic population is 228 -  563 umol/L with a mean of 396 umol/L.   Marland Kitchen Sodium 01/09/2016 139  135 - 145 mEq/L Final  . Potassium 01/09/2016 3.8  3.5 - 5.1 mEq/L Final  . Chloride 01/09/2016 103  96 - 112 mEq/L Final  . CO2 01/09/2016 27  19 - 32 mEq/L Final  . Glucose, Bld 01/09/2016 93  70 - 99 mg/dL Final  . BUN 01/09/2016 16  6 - 23 mg/dL Final  . Creatinine, Ser 01/09/2016 1.36  0.40 - 1.50 mg/dL Final  . Calcium 01/09/2016 9.6  8.4 - 10.5 mg/dL Final  . GFR 01/09/2016 70.79  >60.00 mL/min Final  . Testosterone 01/09/2016 349.92  300.00 - 890.00 ng/dL Final      Medication List       This list is accurate as of: 01/16/16  3:44 PM.  Always use your most recent med list.               allopurinol 300 MG tablet  Commonly known as:  ZYLOPRIM  Take 1 tablet (300 mg total) by mouth daily.     amLODipine 5 MG tablet  Commonly known as:  NORVASC  TAKE 1 TABLET BY MOUTH EVERY DAY      aspirin 81 MG tablet  Take 81 mg by mouth daily.     BD PEN NEEDLE NANO U/F 32G X 4 MM Misc  Generic drug:  Insulin Pen Needle  USE EVERY DAY     canagliflozin 100 MG Tabs tablet  Commonly known as:  INVOKANA  1 tablet before breakfast     empagliflozin 25 MG Tabs tablet  Commonly known as:  JARDIANCE  Take 25 mg by mouth daily.     esomeprazole 40 MG capsule  Commonly known as:  NEXIUM  Take 1 capsule (40 mg total) by mouth daily at 12 noon.     fluticasone 50 MCG/ACT nasal spray  Commonly known as:  FLONASE  PLACE 2 SPRAYS INTO THE NOSE DAILY.     glimepiride 1 MG tablet  Commonly known as:  AMARYL  Take 1 tablet (1 mg total) by mouth daily with breakfast.     glucose blood test strip  Commonly known as:  ONE TOUCH ULTRA TEST  Use as instructed to check blood sugar two times a day dx code E11.9     lisinopril-hydrochlorothiazide 20-25 MG tablet  Commonly known as:  PRINZIDE,ZESTORETIC  Take 1 tablet by mouth every morning.     metFORMIN 1000 MG tablet  Commonly known as:  GLUCOPHAGE  TAKE 1 TABLET BY MOUTH TWO TIMES A DAY     ONETOUCH DELICA LANCETS FINE Misc  Use to check blood sugar 2 times per day, dx code E11.9     ONETOUCH VERIO IQ SYSTEM w/Device Kit  Use to check blood sugar 2 times daily     simvastatin 20 MG tablet  Commonly known as:  ZOCOR  TAKE 1 TABLET (20 MG TOTAL) BY MOUTH AT BEDTIME.     Testosterone 2 MG/24HR Pt24  Commonly known as:  ANDRODERM  Use one patch per day with a 4 mg patch (total dose= 6 mg)     ANDRODERM 4 MG/24HR Pt24 patch  Generic drug:  testosterone  APPLY 2 PATCHES DAILY     VIAGRA 100 MG tablet  Generic drug:  sildenafil     VICTOZA 18 MG/3ML Sopn  Generic drug:  Liraglutide  INJECT 1.8 MG UNDER THE SKIN DAILY.        Allergies:  Allergies  Allergen Reactions  . Celebrex [Celecoxib] Itching    REACTION: itching  . Penicillins Itching  . Sulfonamide Derivatives Itching    REACTION: itching    Past  Medical History  Diagnosis Date  . Diabetes mellitus   . Hypertension   . Hyperlipidemia   . Gout     Past Surgical History  Procedure Laterality Date  . Knee surgery Left 2005    Family History  Problem Relation Age of Onset  . Diabetes Mother   . Diabetes Maternal Aunt   . Diabetes Maternal Grandmother   . Heart disease Neg Hx   . Colon cancer Neg Hx   . Esophageal cancer Neg Hx   . Rectal cancer Neg Hx   . Stomach cancer Other     Social History:  reports that he has never smoked. He has never used smokeless tobacco. He reports that he drinks alcohol. He reports that he does not use illicit drugs.    Review of Systems   He is starting treatment for sleep apnea  HYPERTENSION: This is well controlled with 2 drugs including Norvasc  He is also on  Zestoretic 20/25 mg, half tablet with starting Jardiance 20 mg as directed     Hyperlipidemia: LDL has been controlled, baseline previously 109, has been tolerating simvastatin. Has a low HDL  Lab Results  Component Value Date   CHOL 102 08/15/2015   HDL 33.70* 08/15/2015   LDLCALC 57 08/15/2015   TRIG 56.0 08/15/2015   CHOLHDL 3 08/15/2015       He has had a history of erectile dysfunction and uses Levitra as needed     He has been on testosterone supplements since 2011 when his level was mildly low around 300  but no further evaluation done.   He has a relatively low level on using Axiron and had difficulty with the application With starting Androderm at therapeutic doses he had improved testosterone levels and less fatigue  He feels slightly tired at times and may at times have difficulty with sustaining erections Also takes Viagra With 8 mg Androderm his level was upper normal at 690  With  taking 6 mg total currently his level is up to 350, on his previous visit he was inconsistent with taking his medication and now is doing better with his compliance   Lab Results  Component Value Date   TESTOSTERONE  349.92 01/09/2016     LABS:  No visits with results within 1 Week(s) from this visit. Latest known visit with results is:  Lab on 01/09/2016  Component Date Value Ref Range Status  . Fructosamine 01/09/2016 254  0 - 285 umol/L Final   Comment: Published reference interval for apparently healthy subjects between age 60 and 20 is 28 - 285 umol/L and in a poorly controlled diabetic population is 228 - 563 umol/L with a mean of 396 umol/L.   Marland Kitchen Sodium 01/09/2016 139  135 - 145 mEq/L Final  . Potassium 01/09/2016 3.8  3.5 - 5.1 mEq/L Final  . Chloride 01/09/2016 103  96 - 112 mEq/L Final  . CO2 01/09/2016 27  19 - 32 mEq/L Final  . Glucose, Bld 01/09/2016 93  70 - 99 mg/dL Final  . BUN 01/09/2016 16  6 - 23 mg/dL Final  . Creatinine, Ser 01/09/2016 1.36  0.40 - 1.50 mg/dL Final  . Calcium 01/09/2016 9.6  8.4 - 10.5 mg/dL Final  . GFR 01/09/2016 70.79  >60.00 mL/min Final  . Testosterone  01/09/2016 349.92  300.00 - 890.00 ng/dL Final     Physical Examination:  BP 132/80 mmHg  Pulse 77  Ht '5\' 11"'  (1.803 m)  Wt 271 lb (122.925 kg)  BMI 37.81 kg/m2  SpO2 95%        ASSESSMENT/PLAN:   Diabetes type 2, fair control, BMI 39 See history of present illness for detailed discussion of his current management, blood sugar patterns and problems identified His blood sugars are Reportedly better at home 20 mg Jardiance He can go to the 25 mg dose Has had some weight loss fructosamine indicates with control   HYPOGONADISM: He is having a better level with being consistent with his patches Although his level is only modestly in the normal range at 349,it was nearly 700 with using 8 mg instead of 6 previously  HYPERTENSION:  His blood pressure is controlled, his next prescription can be as 1 lisinopril HCT 10/12.5 mg  Total visit time for review of multiple problems, labs, medication review, counseling = 25 minute   Patient Instructions  Check blood sugars on waking up   times a  week Also check blood sugars about 2 hours after a meal and do this after different meals by rotation  Recommended blood sugar levels on waking up is 90-130 and about 2 hours after meal is 130-160  Please bring your blood sugar monitor to each visit, thank you  New rx for Jardiance 96m      Jencarlos Nicolson 01/16/2016, 3:44 PM

## 2016-01-16 NOTE — Patient Instructions (Signed)
Check blood sugars on waking up   times a week Also check blood sugars about 2 hours after a meal and do this after different meals by rotation  Recommended blood sugar levels on waking up is 90-130 and about 2 hours after meal is 130-160  Please bring your blood sugar monitor to each visit, thank you  New rx for Jardiance 25mg 

## 2016-03-13 ENCOUNTER — Other Ambulatory Visit: Payer: Self-pay | Admitting: Endocrinology

## 2016-03-13 ENCOUNTER — Other Ambulatory Visit: Payer: Self-pay

## 2016-03-13 MED ORDER — GLIMEPIRIDE 1 MG PO TABS: 1.0000 mg | ORAL_TABLET | Freq: Every day | ORAL | 1 refills | Status: DC

## 2016-03-29 ENCOUNTER — Emergency Department (INDEPENDENT_AMBULATORY_CARE_PROVIDER_SITE_OTHER)
Admission: EM | Admit: 2016-03-29 | Discharge: 2016-03-29 | Disposition: A | Discharge status: Home / Self Care | Payer: BLUE CROSS/BLUE SHIELD | Source: Home / Self Care | Attending: Family Medicine | Admitting: Family Medicine

## 2016-03-29 ENCOUNTER — Encounter: Payer: Self-pay | Admitting: *Deleted

## 2016-03-29 DIAGNOSIS — S0501XA Injury of conjunctiva and corneal abrasion without foreign body, right eye, initial encounter: Secondary | ICD-10-CM | POA: Diagnosis not present

## 2016-03-29 DIAGNOSIS — H5789 Other specified disorders of eye and adnexa: Secondary | ICD-10-CM

## 2016-03-29 MED ORDER — OLOPATADINE HCL 0.1 % OP SOLN: 1.0000 [drp] | Freq: Two times a day (BID) | OPHTHALMIC | 0 refills | Status: DC

## 2016-03-29 MED ORDER — POLYMYXIN B-TRIMETHOPRIM 10000-0.1 UNIT/ML-% OP SOLN: 1.0000 [drp] | Freq: Four times a day (QID) | OPHTHALMIC | 0 refills | Status: AC

## 2016-03-29 NOTE — ED Provider Notes (Signed)
CSN: [id number]     Arrival date & time 03/29/16  1354 History   First MD Initiated Contact with Patient 03/29/16 1418     Chief Complaint  Patient presents with  . Eye Problem   (Consider location/radiation/quality/duration/timing/severity/associated sxs/prior Treatment) HPI  Clifford Kramer is a 50 y.o. male presenting to UC with c/o Right eye irritation, mild redness, and mild watery discharge for about 2 week.  He has not tried anything for his symptoms. He does not recall getting anything in his eye. Denies pain.  Denies recent URI symptoms such as congestion, headache, cough, sore throat or ear pain.  He wears glasses but no contacts.    Past Medical History:  Diagnosis Date  . Diabetes mellitus   . Gout   . Hyperlipidemia   . Hypertension    Past Surgical History:  Procedure Laterality Date  . KNEE SURGERY Left 2005   Family History  Problem Relation Age of Onset  . Diabetes Mother   . Stomach cancer Other   . Diabetes Maternal Aunt   . Diabetes Maternal Grandmother   . Heart disease Neg Hx   . Colon cancer Neg Hx   . Esophageal cancer Neg Hx   . Rectal cancer Neg Hx    Social History  Substance Use Topics  . Smoking status: Never Smoker  . Smokeless tobacco: Never Used  . Alcohol use Yes     Comment: rare beer    Review of Systems  HENT: Negative for congestion, ear pain, facial swelling, rhinorrhea and sore throat.   Eyes: Positive for discharge, redness and itching. Negative for photophobia, pain and visual disturbance.  Respiratory: Negative for cough and shortness of breath.   Neurological: Negative for dizziness, light-headedness and headaches.    Allergies  Celebrex [celecoxib]; Penicillins; and Sulfonamide derivatives  Home Medications   Prior to Admission medications   Medication Sig Start Date End Date Taking? Authorizing Provider  allopurinol (ZYLOPRIM) 300 MG tablet Take 1 tablet (300 mg total) by mouth daily. 07/20/15   Dorena Cookey, MD   amLODipine (NORVASC) 5 MG tablet TAKE 1 TABLET BY MOUTH EVERY DAY 12/19/15   Elayne Snare, MD  ANDRODERM 4 MG/24HR PT24 patch APPLY 2 PATCHES DAILY 12/14/15   Elayne Snare, MD  aspirin 81 MG tablet Take 81 mg by mouth daily.      Historical Provider, MD  BD PEN NEEDLE NANO U/F 32G X 4 MM MISC USE EVERY DAY 07/26/15   Elayne Snare, MD  Blood Glucose Monitoring Suppl (ONETOUCH VERIO IQ SYSTEM) W/DEVICE KIT Use to check blood sugar 2 times daily 05/18/14   Elayne Snare, MD  canagliflozin [facility]) 100 MG TABS tablet 1 tablet before breakfast 08/22/15   Elayne Snare, MD  empagliflozin (JARDIANCE) 25 MG TABS tablet Take 25 mg by mouth daily. 01/16/16   Elayne Snare, MD  esomeprazole (NEXIUM) 40 MG capsule Take 1 capsule (40 mg total) by mouth daily at 12 noon. 07/20/15   Dorena Cookey, MD  fluticasone (FLONASE) 50 MCG/ACT nasal spray PLACE 2 SPRAYS INTO THE NOSE DAILY. 07/20/15   Dorena Cookey, MD  glimepiride (AMARYL) 1 MG tablet Take 1 tablet (1 mg total) by mouth daily with breakfast. 03/13/16   Elayne Snare, MD  glucose blood (ONE TOUCH ULTRA TEST) test strip Use as instructed to check blood sugar two times a day dx code E11.9 09/23/15   Elayne Snare, MD  lisinopril-hydrochlorothiazide (PRINZIDE,ZESTORETIC) 20-25 MG tablet Take 1 tablet by mouth every  morning. Patient taking differently: Take 0.05 tablets by mouth every morning.  07/20/15   Dorena Cookey, MD  metFORMIN (GLUCOPHAGE) 1000 MG tablet TAKE 1 TABLET BY MOUTH TWO TIMES A DAY 08/08/15   Dorena Cookey, MD  olopatadine (PATANOL) 0.1 % ophthalmic solution Place 1 drop into both eyes 2 (two) times daily. 03/29/16   Noland Fordyce, PA-C  ONETOUCH DELICA LANCETS FINE MISC Use to check blood sugar 2 times per day, dx code E11.9 05/27/15   Elayne Snare, MD  simvastatin (ZOCOR) 20 MG tablet TAKE 1 TABLET (20 MG TOTAL) BY MOUTH AT BEDTIME. 03/13/16   Elayne Snare, MD  Testosterone Renae Gloss) 2 MG/24HR PT24 Use one patch per day with a 4 mg patch (total dose= 6 mg) 08/22/15    Elayne Snare, MD  trimethoprim-polymyxin b (POLYTRIM) ophthalmic solution Place 1 drop into the right eye every 6 (six) hours. 03/29/16 04/03/16  Noland Fordyce, PA-C  VIAGRA 100 MG tablet  04/01/14   Historical Provider, MD  VICTOZA 18 MG/3ML SOPN INJECT 1.8 MG UNDER THE SKIN DAILY. 09/02/15   Elayne Snare, MD   Meds Ordered and Administered this Visit  Medications - No data to display  BP 118/77 (BP Location: Left Arm)   Pulse 88   Temp 98.4 F (36.9 C) (Oral)   Ht _0  (1.803 m)   Wt 274 lb (124.3 kg)   SpO2 98%   BMI 38.22 kg/m  No data found.   Physical Exam  Constitutional: He is oriented to person, place, and time. He appears well-developed and well-nourished.  HENT:  Head: Normocephalic and atraumatic.  Eyes: EOM and lids are normal. Pupils are equal, round, and reactive to light. Lids are everted and swept, no foreign bodies found. Right eye exhibits no discharge and no hordeolum. No foreign body present in the right eye. Left eye exhibits no discharge. Right conjunctiva is injected. Right conjunctiva has no hemorrhage. Left conjunctiva is not injected.  Right eye: scant amount of fluorescein uptake c/w corneal abrasion.   Neck: Normal range of motion.  Cardiovascular: Normal rate.   Pulmonary/Chest: Effort normal.  Musculoskeletal: Normal range of motion.  Neurological: He is alert and oriented to person, place, and time.  Skin: Skin is warm and dry.  Psychiatric: He has a normal mood and affect. His behavior is normal.  Nursing note and vitals reviewed.   Urgent Care Course   Clinical Course    Procedures (including critical care time)  Labs Review Labs Reviewed - No data to display  Imaging Review No results found.   Visual Acuity Review  Right Eye Distance: 20/15 Left Eye Distance: 20/30 Bilateral Distance: 20/15 (uncorrected)   MDM   1. Corneal abrasion, right, initial encounter   2. Eye irritation    Right eye irritation, redness, and watering for  about 2 weeks.  Exam c/w small corneal abrasion Rx: polytrim and patanol   Home care instructions provided. F/u with PCP or eye doctor in 2-3 days if not improving, sooner if worsening. Patient verbalized understanding and agreement with treatment plan.     Noland Fordyce, PA-C 03/29/16 1536

## 2016-03-29 NOTE — ED Triage Notes (Signed)
Pt c/o 2 weeks of right eye redness with minimal redness and drainage. He doesn't c/o pain but feels a sensation when blinking at times.

## 2016-03-30 ENCOUNTER — Other Ambulatory Visit: Payer: Self-pay

## 2016-03-30 MED ORDER — EMPAGLIFLOZIN 25 MG PO TABS: 25.0000 mg | ORAL_TABLET | Freq: Every day | ORAL | 0 refills | Status: DC

## 2016-04-03 ENCOUNTER — Other Ambulatory Visit: Payer: Self-pay | Admitting: Endocrinology

## 2016-04-10 ENCOUNTER — Other Ambulatory Visit: Payer: Self-pay | Admitting: Endocrinology

## 2016-04-11 ENCOUNTER — Other Ambulatory Visit (INDEPENDENT_AMBULATORY_CARE_PROVIDER_SITE_OTHER): Payer: BLUE CROSS/BLUE SHIELD

## 2016-04-11 DIAGNOSIS — E291 Testicular hypofunction: Secondary | ICD-10-CM | POA: Diagnosis not present

## 2016-04-11 DIAGNOSIS — E1165 Type 2 diabetes mellitus with hyperglycemia: Secondary | ICD-10-CM

## 2016-04-11 LAB — COMPREHENSIVE METABOLIC PANEL
ALK PHOS: 76 U/L (ref 39–117)
ALT: 25 U/L (ref 0–53)
AST: 23 U/L (ref 0–37)
Albumin: 4.2 g/dL (ref 3.5–5.2)
BUN: 19 mg/dL (ref 6–23)
CO2: 25 mEq/L (ref 19–32)
CREATININE: 1.41 mg/dL (ref 0.40–1.50)
Calcium: 9.2 mg/dL (ref 8.4–10.5)
Chloride: 105 mEq/L (ref 96–112)
GFR: 67.83 mL/min (ref >60.00)
GLUCOSE: 107 mg/dL — AB (ref 70–99)
POTASSIUM: 3.8 meq/L (ref 3.5–5.1)
SODIUM: 138 meq/L (ref 135–145)
TOTAL PROTEIN: 7.5 g/dL (ref 6.0–8.3)
Total Bilirubin: 0.4 mg/dL (ref 0.2–1.2)

## 2016-04-11 LAB — HEMOGLOBIN A1C: HEMOGLOBIN A1C: 7 % — AB (ref 4.6–6.5)

## 2016-04-11 LAB — TESTOSTERONE: Testosterone: 420.3 ng/dL (ref 300.00–890.00)

## 2016-04-16 ENCOUNTER — Telehealth: Payer: Self-pay | Admitting: Endocrinology

## 2016-04-16 NOTE — Telephone Encounter (Signed)
Pt is asking for lab results please

## 2016-04-16 NOTE — Telephone Encounter (Signed)
Results have been released, will discuss on his visit

## 2016-04-16 NOTE — Telephone Encounter (Signed)
Patient is requesting lab results please advise

## 2016-04-17 ENCOUNTER — Other Ambulatory Visit: Payer: BLUE CROSS/BLUE SHIELD

## 2016-04-18 ENCOUNTER — Other Ambulatory Visit: Payer: Self-pay | Admitting: Endocrinology

## 2016-04-20 ENCOUNTER — Encounter: Payer: Self-pay | Admitting: Endocrinology

## 2016-04-20 ENCOUNTER — Other Ambulatory Visit: Payer: Self-pay | Admitting: *Deleted

## 2016-04-20 ENCOUNTER — Ambulatory Visit (INDEPENDENT_AMBULATORY_CARE_PROVIDER_SITE_OTHER): Payer: BLUE CROSS/BLUE SHIELD | Admitting: Endocrinology

## 2016-04-20 VITALS — BP 122/78 | HR 96 | Temp 97.9°F | Resp 16 | Ht 71.0 in | Wt 274.8 lb

## 2016-04-20 DIAGNOSIS — Z794 Long term (current) use of insulin: Secondary | ICD-10-CM

## 2016-04-20 DIAGNOSIS — Z23 Encounter for immunization: Secondary | ICD-10-CM

## 2016-04-20 DIAGNOSIS — I10 Essential (primary) hypertension: Secondary | ICD-10-CM | POA: Diagnosis not present

## 2016-04-20 DIAGNOSIS — M109 Gout, unspecified: Secondary | ICD-10-CM

## 2016-04-20 DIAGNOSIS — E1165 Type 2 diabetes mellitus with hyperglycemia: Secondary | ICD-10-CM | POA: Diagnosis not present

## 2016-04-20 DIAGNOSIS — E291 Testicular hypofunction: Secondary | ICD-10-CM

## 2016-04-20 MED ORDER — LISINOPRIL 10 MG PO TABS: 10.0000 mg | ORAL_TABLET | Freq: Every day | ORAL | 1 refills | Status: DC

## 2016-04-20 NOTE — Progress Notes (Signed)
Patient ID: Clifford Kramer, male   DOB: Sep 07, 1965, 50 y.o.   MRN: [id number]   Reason for Appointment : Followup for Type 2 Diabetes  History of Present Illness          Diagnosis: Type 2 diabetes mellitus, date of diagnosis: 2005     Background information: He was probably started on metformin at the time of diagnosis when his blood sugars were not very high About 5-6 years ago because of higher blood sugars he was also given glipizide  He appeared to have  progression of his diabetes with A1c going up to 8.1% in 01/2013; also had difficulty with losing weight before he was started on Victoza.  With this his blood sugars were significantly better His glipizide was reduced to 2.5 mg in 10/14 because of rare hypoglycemia He has taken 1.8 mg of Victoza since 7/15   RECENT history:   Antihyperglycemic drugs : Victoza 1.8 mg, Metformin 2 g and Amaryl 1 mg acs, Jardiance 25 mg daily    Works 1 am to 1 pm, sleeps till 10 pm  His A1c is fairly stable at 7%, about the same since 12/16  Current management, blood sugar patterns and problems:  He was given 25 mg Jardiance on his last visit for better efficacy  He has only a few blood sugars at home and these are sporadically higher when he does not watch her diet consistently  Recently has not been able to exercise as much and weight is up slightly  He is taking his Amaryl when he comes back from work instead of when he gets up in the evening with relatively higher sugars during his work hours  No hypoglycemia   Side effects from medications have been: None       Monitors blood glucose:  infrequently.         Glucometer: One Touch Verio     Blood Glucose readings by download:  Mean values apply above for all meters except median for One Touch  PRE-MEAL Fasting Lunch 1 AM  Mornings  Overall  Glucose range: 108  139-168  136, 208  104-261    Mean/median:     145             LIFESTYLE: Meals: 3 meals per day. Dinner 6 pm,  Bfst 7 am; lunch 12 am;  eating out periodically His breakfast maybe the cereal or eggs and toast and meat  Usually eating fruit and sandwich at lunch, usually a grilled meat at suppertime   Physical activity: exercise: 3-4/7 days a week, at gym or walking, 90 min, less in 2 weeks         Dietician visit: 05/2013           Wt Readings from Last 3 Encounters:  04/20/16 274 lb 12.8 oz (124.6 kg)  03/29/16 274 lb (124.3 kg)  01/16/16 271 lb (122.9 kg)   Lab Results  Component Value Date   HGBA1C 7.0 (H) 04/11/2016   HGBA1C 7.1 (H) 11/28/2015   HGBA1C 6.9 (H) 07/18/2015   Lab Results  Component Value Date   MICROALBUR 2.1 (H) 07/18/2015   LDLCALC 57 08/15/2015   CREATININE 1.41 04/11/2016    Other active problems including new problems are in review of systems    No visits with results within 1 Week(s) from this visit.  Latest known visit with results is:  Lab on 04/11/2016  Component Date Value Ref Range Status  . Hgb A1c  MFr Bld 04/11/2016 7.0* 4.6 - 6.5 % Final  . Sodium 04/11/2016 138  135 - 145 mEq/L Final  . Potassium 04/11/2016 3.8  3.5 - 5.1 mEq/L Final  . Chloride 04/11/2016 105  96 - 112 mEq/L Final  . CO2 04/11/2016 25  19 - 32 mEq/L Final  . Glucose, Bld 04/11/2016 107* 70 - 99 mg/dL Final  . BUN 04/11/2016 19  6 - 23 mg/dL Final  . Creatinine, Ser 04/11/2016 1.41  0.40 - 1.50 mg/dL Final  . Total Bilirubin 04/11/2016 0.4  0.2 - 1.2 mg/dL Final  . Alkaline Phosphatase 04/11/2016 76  39 - 117 U/L Final  . AST 04/11/2016 23  0 - 37 U/L Final  . ALT 04/11/2016 25  0 - 53 U/L Final  . Total Protein 04/11/2016 7.5  6.0 - 8.3 g/dL Final  . Albumin 04/11/2016 4.2  3.5 - 5.2 g/dL Final  . Calcium 04/11/2016 9.2  8.4 - 10.5 mg/dL Final  . GFR 04/11/2016 67.83  >60.00 mL/min Final  . Testosterone 04/11/2016 420.30  300.00 - 890.00 ng/dL Final      Medication List       Accurate as of 04/20/16 11:59 PM. Always use your most recent med list.            allopurinol 300 MG tablet Commonly known as:  ZYLOPRIM Take 1 tablet (300 mg total) by mouth daily.   amLODipine 5 MG tablet Commonly known as:  NORVASC TAKE 1 TABLET BY MOUTH EVERY DAY   ANDRODERM 4 MG/24HR Pt24 patch Generic drug:  testosterone APPLY 2 PATCHES DAILY   ANDRODERM 2 MG/24HR Pt24 Generic drug:  Testosterone USE 1 PATCH PER DAY WITH A 4 MG PATCH   aspirin 81 MG tablet Take 81 mg by mouth daily.   BD PEN NEEDLE NANO U/F 32G X 4 MM Misc Generic drug:  Insulin Pen Needle USE EVERY DAY   empagliflozin 25 MG Tabs tablet Commonly known as:  JARDIANCE Take 25 mg by mouth daily.   esomeprazole 40 MG capsule Commonly known as:  NEXIUM Take 1 capsule (40 mg total) by mouth daily at 12 noon.   fluticasone 50 MCG/ACT nasal spray Commonly known as:  FLONASE PLACE 2 SPRAYS INTO THE NOSE DAILY.   glimepiride 1 MG tablet Commonly known as:  AMARYL Take 1 tablet (1 mg total) by mouth daily with breakfast.   glucose blood test strip Commonly known as:  ONE TOUCH ULTRA TEST Use as instructed to check blood sugar two times a day dx code E11.9   lisinopril 10 MG tablet Commonly known as:  PRINIVIL,ZESTRIL Take 1 tablet (10 mg total) by mouth daily.   lisinopril-hydrochlorothiazide 20-25 MG tablet Commonly known as:  PRINZIDE,ZESTORETIC Take 1 tablet by mouth every morning.   metFORMIN 1000 MG tablet Commonly known as:  GLUCOPHAGE TAKE 1 TABLET BY MOUTH TWO TIMES A DAY   olopatadine 0.1 % ophthalmic solution Commonly known as:  PATANOL Place 1 drop into both eyes 2 (two) times daily.   ONETOUCH DELICA LANCETS FINE Misc Use to check blood sugar 2 times per day, dx code E11.9   ONETOUCH VERIO IQ SYSTEM w/Device Kit Use to check blood sugar 2 times daily   simvastatin 20 MG tablet Commonly known as:  ZOCOR TAKE 1 TABLET (20 MG TOTAL) BY MOUTH AT BEDTIME.   VIAGRA 100 MG tablet Generic drug:  sildenafil   VICTOZA 18 MG/3ML Sopn Generic drug:   Liraglutide INJECT 1.8 MG UNDER THE  SKIN DAILY.       Allergies:  Allergies  Allergen Reactions  . Celebrex [Celecoxib] Itching    REACTION: itching  . Penicillins Itching  . Sulfonamide Derivatives Itching    REACTION: itching    Past Medical History:  Diagnosis Date  . Diabetes mellitus   . Gout   . Hyperlipidemia   . Hypertension     Past Surgical History:  Procedure Laterality Date  . KNEE SURGERY Left 2005    Family History  Problem Relation Age of Onset  . Diabetes Mother   . Stomach cancer Other   . Diabetes Maternal Aunt   . Diabetes Maternal Grandmother   . Heart disease Neg Hx   . Colon cancer Neg Hx   . Esophageal cancer Neg Hx   . Rectal cancer Neg Hx     Social History:  reports that he has never smoked. He has never used smokeless tobacco. He reports that he drinks alcohol. He reports that he does not use drugs.    Review of Systems   He is Taking treatment for sleep apnea  HYPERTENSION: This is well controlled with 2 drugs including Norvasc  He is also on  Zestoretic 20/25 mg, half tablet with starting Jardiance 25 No lightheadedness  GOUT: He has had recurrent episodes of gout and various joints including knee and elbow periodically He has not discussed that this is not being controlled with his allopurinol with his PCP Baseline uric acid was 8.4 done in 2012 but has not had follow-up He says he takes either Tylenol or Advil as needed, usually with good control Currently is not having an active attack      Hyperlipidemia: LDL has been controlled, baseline previously 109, has been tolerating simvastatin. Has a low HDL  Lab Results  Component Value Date   CHOL 102 08/15/2015   HDL 33.70 (L) 08/15/2015   LDLCALC 57 08/15/2015   TRIG 56.0 08/15/2015   CHOLHDL 3 08/15/2015       He has had a history of erectile dysfunction and uses Levitra as needed     He has been on testosterone supplements since 2011 when his level was mildly low  around 300  but no further evaluation done.   He has a relatively low level on using Axiron and had difficulty with the application With starting Androderm at therapeutic doses he had improved testosterone levels and less fatigue  He feels slightly tired at times and may at times have difficulty with sustaining erections Also takes Viagra With 8 mg Androderm his level was upper normal at 690  With  taking 6 mg total currently his level is up to 420, on his previous visit he was inconsistent with taking his medication and now is doing better with his compliance   Lab Results  Component Value Date   TESTOSTERONE 420.30 04/11/2016     LABS:  No visits with results within 1 Week(s) from this visit.  Latest known visit with results is:  Lab on 04/11/2016  Component Date Value Ref Range Status  . Hgb A1c MFr Bld 04/11/2016 7.0* 4.6 - 6.5 % Final  . Sodium 04/11/2016 138  135 - 145 mEq/L Final  . Potassium 04/11/2016 3.8  3.5 - 5.1 mEq/L Final  . Chloride 04/11/2016 105  96 - 112 mEq/L Final  . CO2 04/11/2016 25  19 - 32 mEq/L Final  . Glucose, Bld 04/11/2016 107* 70 - 99 mg/dL Final  . BUN 04/11/2016 19  6 -  23 mg/dL Final  . Creatinine, Ser 04/11/2016 1.41  0.40 - 1.50 mg/dL Final  . Total Bilirubin 04/11/2016 0.4  0.2 - 1.2 mg/dL Final  . Alkaline Phosphatase 04/11/2016 76  39 - 117 U/L Final  . AST 04/11/2016 23  0 - 37 U/L Final  . ALT 04/11/2016 25  0 - 53 U/L Final  . Total Protein 04/11/2016 7.5  6.0 - 8.3 g/dL Final  . Albumin 04/11/2016 4.2  3.5 - 5.2 g/dL Final  . Calcium 04/11/2016 9.2  8.4 - 10.5 mg/dL Final  . GFR 04/11/2016 67.83  >60.00 mL/min Final  . Testosterone 04/11/2016 420.30  300.00 - 890.00 ng/dL Final     Physical Examination:  BP 122/78   Pulse 96   Temp 97.9 F (36.6 C)   Resp 16   Ht _0  (1.803 m)   Wt 274 lb 12.8 oz (124.6 kg)   SpO2 99%   BMI 38.33 kg/m       No enlargement of peripheral joints No peripheral  edema   ASSESSMENT/PLAN:   Diabetes type 2, fair control, BMI 39 See history of present illness for detailed discussion of his current management, blood sugar patterns and problems identified His blood sugars are Fairly well controlled with A1c 7% On multiple drugs Since he has overall high readings during his work hours overnight he will change his Amaryl to when he gets up in the evening rather than on coming back from work He will also try to get back into his exercise regimen regularly Discussed trying to check that sugars 2 hours after meals Also needs to cut back on excessive carbohydrate which causes high readings sporadically   HYPERURICEMIA and recurrent episodes of gout.  Although his episodes are minor and relieved by OTC medications he needs better assessment of his uric acid control However for simplicity since his blood pressure is lower may be able to improve uric acid test levels with stopping HCTZ   HYPOGONADISM: He is having a better level with 6 mg of Androderm He is using this consistently now and advised him to stay regular with this every 24 hours regardless of whether he is working or not  He will continue same regimen, also doing well subjectively   HYPERTENSION:  His blood pressure is controlled, since it is low normal with higher dose of Jardiance he can try lisinopril 10 mg by itself, encouraged him to check blood pressure at least at drugstore  Influenza vaccine given  Patient Instructions  Take Glimeperide on waking up   Check blood sugars on waking up 2x per week  Also check blood sugars about 2 hours after a meal and do this after different meals by rotation  Recommended blood sugar levels on waking up is 90-130 and about 2 hours after meal is 130-160  Please bring your blood sugar monitor to each visit, thank you    Counseling time on subjects discussed above is over 50% of today's 25 minute visit   Kimball Appleby 04/22/2016, 6:47 PM

## 2016-04-20 NOTE — Patient Instructions (Signed)
Take Glimeperide on waking up   Check blood sugars on waking up 2x per week  Also check blood sugars about 2 hours after a meal and do this after different meals by rotation  Recommended blood sugar levels on waking up is 90-130 and about 2 hours after meal is 130-160  Please bring your blood sugar monitor to each visit, thank you

## 2016-05-01 ENCOUNTER — Encounter: Payer: Self-pay | Admitting: Endocrinology

## 2016-05-15 ENCOUNTER — Encounter: Payer: Self-pay | Admitting: Endocrinology

## 2016-05-17 ENCOUNTER — Telehealth: Payer: Self-pay | Admitting: Family Medicine

## 2016-05-17 NOTE — Telephone Encounter (Signed)
Pt would like to have a Rx for a motion sickness patch (going on a cruise)  Pharm:  [facility]

## 2016-05-18 NOTE — Telephone Encounter (Signed)
Called and left voicemail for pt to return call to office. Called to inform pt that Dr. Sherren Mocha is out for the next week and I would verify with him if prescription would be okay upon his return.

## 2016-05-22 ENCOUNTER — Other Ambulatory Visit: Payer: Self-pay | Admitting: Family Medicine

## 2016-05-22 ENCOUNTER — Encounter: Payer: Self-pay | Admitting: Family Medicine

## 2016-05-22 ENCOUNTER — Other Ambulatory Visit: Payer: Self-pay | Admitting: Endocrinology

## 2016-05-22 MED ORDER — SCOPOLAMINE 1 MG/3DAYS TD PT72: 1.0000 | MEDICATED_PATCH | TRANSDERMAL | 0 refills | Status: DC

## 2016-05-26 ENCOUNTER — Other Ambulatory Visit: Payer: Self-pay | Admitting: Endocrinology

## 2016-06-08 ENCOUNTER — Other Ambulatory Visit: Payer: Self-pay | Admitting: Endocrinology

## 2016-06-29 DIAGNOSIS — H15101 Unspecified episcleritis, right eye: Secondary | ICD-10-CM

## 2016-06-29 HISTORY — DX: Unspecified episcleritis, right eye: H15.101

## 2016-07-05 ENCOUNTER — Other Ambulatory Visit (INDEPENDENT_AMBULATORY_CARE_PROVIDER_SITE_OTHER): Payer: BLUE CROSS/BLUE SHIELD

## 2016-07-05 DIAGNOSIS — M109 Gout, unspecified: Secondary | ICD-10-CM

## 2016-07-05 DIAGNOSIS — Z794 Long term (current) use of insulin: Secondary | ICD-10-CM | POA: Diagnosis not present

## 2016-07-05 DIAGNOSIS — E1165 Type 2 diabetes mellitus with hyperglycemia: Secondary | ICD-10-CM

## 2016-07-05 LAB — COMPREHENSIVE METABOLIC PANEL
ALBUMIN: 4.1 g/dL (ref 3.5–5.2)
ALT: 25 U/L (ref 0–53)
AST: 19 U/L (ref 0–37)
Alkaline Phosphatase: 78 U/L (ref 39–117)
BUN: 12 mg/dL (ref 6–23)
CHLORIDE: 106 meq/L (ref 96–112)
CO2: 24 mEq/L (ref 19–32)
CREATININE: 1.27 mg/dL (ref 0.40–1.50)
Calcium: 9.1 mg/dL (ref 8.4–10.5)
GFR: 76.46 mL/min (ref >60.00)
GLUCOSE: 181 mg/dL — AB (ref 70–99)
POTASSIUM: 3.9 meq/L (ref 3.5–5.1)
SODIUM: 139 meq/L (ref 135–145)
TOTAL PROTEIN: 7.4 g/dL (ref 6.0–8.3)
Total Bilirubin: 0.3 mg/dL (ref 0.2–1.2)

## 2016-07-05 LAB — LIPID PANEL
CHOL/HDL RATIO: 3
CHOLESTEROL: 95 mg/dL (ref 0–200)
HDL: 32.5 mg/dL — ABNORMAL LOW (ref >39.00)
LDL CALC: 42 mg/dL (ref 0–99)
NonHDL: 62.7
TRIGLYCERIDES: 103 mg/dL (ref 0.0–149.0)
VLDL: 20.6 mg/dL (ref 0.0–40.0)

## 2016-07-05 LAB — URINALYSIS, ROUTINE W REFLEX MICROSCOPIC
Bilirubin Urine: NEGATIVE
Hgb urine dipstick: NEGATIVE
Ketones, ur: NEGATIVE
Leukocytes, UA: NEGATIVE
NITRITE: NEGATIVE
RBC / HPF: NONE SEEN (ref 0–?)
SPECIFIC GRAVITY, URINE: 1.01 (ref 1.000–1.030)
Total Protein, Urine: NEGATIVE
Urobilinogen, UA: 0.2 (ref 0.0–1.0)
pH: 6 (ref 5.0–8.0)

## 2016-07-05 LAB — MICROALBUMIN / CREATININE URINE RATIO
Creatinine,U: 120.5 mg/dL
Microalb Creat Ratio: 0.6 mg/g (ref 0.0–30.0)

## 2016-07-05 LAB — URIC ACID: URIC ACID, SERUM: 3.2 mg/dL — AB (ref 4.0–7.8)

## 2016-07-05 LAB — HEMOGLOBIN A1C: Hgb A1c MFr Bld: 6.8 % — ABNORMAL HIGH (ref 4.6–6.5)

## 2016-07-06 ENCOUNTER — Other Ambulatory Visit: Payer: BLUE CROSS/BLUE SHIELD

## 2016-07-11 ENCOUNTER — Encounter: Payer: Self-pay | Admitting: Endocrinology

## 2016-07-11 ENCOUNTER — Ambulatory Visit (INDEPENDENT_AMBULATORY_CARE_PROVIDER_SITE_OTHER): Payer: BLUE CROSS/BLUE SHIELD | Admitting: Endocrinology

## 2016-07-11 ENCOUNTER — Other Ambulatory Visit: Payer: Self-pay | Admitting: Family Medicine

## 2016-07-11 VITALS — BP 116/80 | HR 94 | Ht 71.0 in | Wt 283.0 lb

## 2016-07-11 DIAGNOSIS — E1165 Type 2 diabetes mellitus with hyperglycemia: Secondary | ICD-10-CM

## 2016-07-11 DIAGNOSIS — E291 Testicular hypofunction: Secondary | ICD-10-CM | POA: Diagnosis not present

## 2016-07-11 NOTE — Patient Instructions (Signed)
More exercise  Invokana on waking up  Check blood sugars on waking up    Also check blood sugars about 2 hours after a meal and do this after different meals by rotation  Recommended blood sugar levels on waking up is 90-130 and about 2 hours after meal is 130-160  Please bring your blood sugar monitor to each visit, thank you

## 2016-07-11 NOTE — Progress Notes (Signed)
Patient ID: Clifford Kramer, male   DOB: 25-Jun-1966, 50 y.o.   MRN: [id number]   Reason for Appointment : Followup for Type 2 Diabetes  History of Present Illness          Diagnosis: Type 2 diabetes mellitus, date of diagnosis: 2005     Background information: He was probably started on metformin at the time of diagnosis when his blood sugars were not very high About 5-6 years ago because of higher blood sugars he was also given glipizide  He appeared to have  progression of his diabetes with A1c going up to 8.1% in 01/2013; also had difficulty with losing weight before he was started on Victoza.  With this his blood sugars were significantly better His glipizide was reduced to 2.5 mg in 10/14 because of rare hypoglycemia He has taken 1.8 mg of Victoza since 7/15   RECENT history:   Antihyperglycemic drugs : Victoza 1.8 mg, Metformin 2 g and Amaryl 1 mg acs, Jardiance 25 mg daily    Works 1 am to 1 pm, sleeps till 10 pm  His A1c is fairly stable at 6.8, previously 7%  Current management, blood sugar patterns and problems:  He has not been able to check his blood sugar as much as before and only in the morning hours, probably mostly on his days off.  On occasion his blood sugars are about 150-170 but may be on the days he is working   He has gained a significant amount of weight  He thinks this is from not being consistent with his diet and also not finding the time to exercise as much  He was told to take Amaryl before he goes to work in the evenings  No hypoglycemia   Side effects from medications have been: None       Monitors blood glucose:  infrequently.         Glucometer: One Touch Verio     Blood Glucose readings by download:  Mean values apply above for all meters except median for One Touch  PRE-MEAL 10 AM  Lunch Dinner Bedtime Overall  Glucose range: 116-174  155  98     Mean/median: 135         LIFESTYLE: Meals: 3 meals per day. Dinner 10 pm, Bfst  6-7 am; lunch 12 am;  eating out periodically  His breakfast maybe the cereal or eggs and toast and meat  Usually eating fruit and sandwich at lunch, usually a grilled meat at suppertime   Physical activity: exercise: 1-4/7 days a week, at gym or walking, 90 min, less in 2 weeks         Dietician visit: 05/2013           Wt Readings from Last 3 Encounters:  07/11/16 283 lb (128.4 kg)  04/20/16 274 lb 12.8 oz (124.6 kg)  03/29/16 274 lb (124.3 kg)   Lab Results  Component Value Date   HGBA1C 6.8 (H) 07/05/2016   HGBA1C 7.0 (H) 04/11/2016   HGBA1C 7.1 (H) 11/28/2015   Lab Results  Component Value Date   MICROALBUR <0.7 07/05/2016   LDLCALC 42 07/05/2016   CREATININE 1.27 07/05/2016    Other active problems including new problems are in review of systems    Lab on 07/05/2016  Component Date Value Ref Range Status  . Hgb A1c MFr Bld 07/05/2016 6.8* 4.6 - 6.5 % Final  . Sodium 07/05/2016 139  135 - 145 mEq/L Final  .  Potassium 07/05/2016 3.9  3.5 - 5.1 mEq/L Final  . Chloride 07/05/2016 106  96 - 112 mEq/L Final  . CO2 07/05/2016 24  19 - 32 mEq/L Final  . Glucose, Bld 07/05/2016 181* 70 - 99 mg/dL Final  . BUN 07/05/2016 12  6 - 23 mg/dL Final  . Creatinine, Ser 07/05/2016 1.27  0.40 - 1.50 mg/dL Final  . Total Bilirubin 07/05/2016 0.3  0.2 - 1.2 mg/dL Final  . Alkaline Phosphatase 07/05/2016 78  39 - 117 U/L Final  . AST 07/05/2016 19  0 - 37 U/L Final  . ALT 07/05/2016 25  0 - 53 U/L Final  . Total Protein 07/05/2016 7.4  6.0 - 8.3 g/dL Final  . Albumin 07/05/2016 4.1  3.5 - 5.2 g/dL Final  . Calcium 07/05/2016 9.1  8.4 - 10.5 mg/dL Final  . GFR 07/05/2016 76.46  >60.00 mL/min Final  . Microalb, Ur 07/05/2016 <0.7  0.0 - 1.9 mg/dL Final  . Creatinine,U 07/05/2016 120.5  mg/dL Final  . Microalb Creat Ratio 07/05/2016 0.6  0.0 - 30.0 mg/g Final  . Cholesterol 07/05/2016 95  0 - 200 mg/dL Final  . Triglycerides 07/05/2016 103.0  0.0 - 149.0 mg/dL Final  . HDL  07/05/2016 32.50* >39.00 mg/dL Final  . VLDL 07/05/2016 20.6  0.0 - 40.0 mg/dL Final  . LDL Cholesterol 07/05/2016 42  0 - 99 mg/dL Final  . Total CHOL/HDL Ratio 07/05/2016 3   Final  . NonHDL 07/05/2016 62.70   Final  . Color, Urine 07/05/2016 YELLOW  Yellow;Lt. Yellow Final  . APPearance 07/05/2016 CLEAR  Clear Final  . Specific Gravity, Urine 07/05/2016 1.010  1.000 - 1.030 Final  . pH 07/05/2016 6.0  5.0 - 8.0 Final  . Total Protein, Urine 07/05/2016 NEGATIVE  Negative Final  . Urine Glucose 07/05/2016 >=1000* Negative Final  . Ketones, ur 07/05/2016 NEGATIVE  Negative Final  . Bilirubin Urine 07/05/2016 NEGATIVE  Negative Final  . Hgb urine dipstick 07/05/2016 NEGATIVE  Negative Final  . Urobilinogen, UA 07/05/2016 0.2  0.0 - 1.0 Final  . Leukocytes, UA 07/05/2016 NEGATIVE  Negative Final  . Nitrite 07/05/2016 NEGATIVE  Negative Final  . WBC, UA 07/05/2016 0-2/hpf  0-2/hpf Final  . RBC / HPF 07/05/2016 none seen  0-2/hpf Final  . Uric Acid, Serum 07/05/2016 3.2* 4.0 - 7.8 mg/dL Final      Medication List       Accurate as of 07/11/16  4:38 PM. Always use your most recent med list.          allopurinol 300 MG tablet Commonly known as:  ZYLOPRIM TAKE 1 TABLET (300 MG TOTAL) BY MOUTH DAILY.   amLODipine 5 MG tablet Commonly known as:  NORVASC TAKE 1 TABLET BY MOUTH EVERY DAY   ANDRODERM 4 MG/24HR Pt24 patch Generic drug:  testosterone APPLY 2 PATCHES DAILY   ANDRODERM 2 MG/24HR Pt24 Generic drug:  Testosterone USE 1 PATCH PER DAY WITH A 4 MG PATCH   aspirin 81 MG tablet Take 81 mg by mouth daily.   BD PEN NEEDLE NANO U/F 32G X 4 MM Misc Generic drug:  Insulin Pen Needle USE EVERY DAY   empagliflozin 25 MG Tabs tablet Commonly known as:  JARDIANCE Take 25 mg by mouth daily.   JARDIANCE 25 MG Tabs tablet Generic drug:  empagliflozin TAKE 25 MG BY MOUTH DAILY.   esomeprazole 40 MG capsule Commonly known as:  NEXIUM Take 1 capsule (40 mg total) by mouth  daily at 12 noon.   fluticasone 50 MCG/ACT nasal spray Commonly known as:  FLONASE PLACE 2 SPRAYS INTO THE NOSE DAILY.   glimepiride 1 MG tablet Commonly known as:  AMARYL Take 1 tablet (1 mg total) by mouth daily with breakfast.   lisinopril 10 MG tablet Commonly known as:  PRINIVIL,ZESTRIL Take 1 tablet (10 mg total) by mouth daily.   metFORMIN 1000 MG tablet Commonly known as:  GLUCOPHAGE TAKE 1 TABLET BY MOUTH TWO TIMES A DAY   olopatadine 0.1 % ophthalmic solution Commonly known as:  PATANOL Place 1 drop into both eyes 2 (two) times daily.   ONE TOUCH ULTRA TEST test strip Generic drug:  glucose blood USE AS INSTRUCTED TO CHECK BLOOD SUGAR TWO TIMES A DAY DX CODE T34.2   ONETOUCH DELICA LANCETS FINE Misc Use to check blood sugar 2 times per day, dx code E11.9   ONETOUCH VERIO IQ SYSTEM w/Device Kit Use to check blood sugar 2 times daily   scopolamine 1 MG/3DAYS Commonly known as:  TRANSDERM-SCOP (1.5 MG) Place 1 patch (1.5 mg total) onto the skin every 3 (three) days.   simvastatin 20 MG tablet Commonly known as:  ZOCOR TAKE 1 TABLET (20 MG TOTAL) BY MOUTH AT BEDTIME.   VIAGRA 100 MG tablet Generic drug:  sildenafil   VICTOZA 18 MG/3ML Sopn Generic drug:  liraglutide INJECT 1.8 MG UNDER THE SKIN DAILY.       Allergies:  Allergies  Allergen Reactions  . Celebrex [Celecoxib] Itching    REACTION: itching  . Penicillins Itching  . Sulfonamide Derivatives Itching    REACTION: itching    Past Medical History:  Diagnosis Date  . Diabetes mellitus   . Gout   . Hyperlipidemia   . Hypertension     Past Surgical History:  Procedure Laterality Date  . KNEE SURGERY Left 2005    Family History  Problem Relation Age of Onset  . Diabetes Mother   . Stomach cancer Other   . Diabetes Maternal Aunt   . Diabetes Maternal Grandmother   . Heart disease Neg Hx   . Colon cancer Neg Hx   . Esophageal cancer Neg Hx   . Rectal cancer Neg Hx     Social  History:  reports that he has never smoked. He has never used smokeless tobacco. He reports that he drinks alcohol. He reports that he does not use drugs.    Review of Systems   He is on treatment for sleep apnea  HYPERTENSION: This is well controlled with 2 drugs including Norvasc and 10 mg lisinopril HCTZ was stopped with using Jardiance and because of hyperuricemia  GOUT: He has had recurrent episodes of gout and various joints including knee and elbow periodically Baseline uric acid was 8.4 done in 2012  He says he takes either Tylenol or Advil as needed, usually with good control Currently is not having an recurrence of his out Taking allopurinol 300 mg daily regularly now Recent uric acid 3.2      Hyperlipidemia: LDL has been controlled, baseline previously 109, has been tolerating simvastatin. Has a low HDL  Lab Results  Component Value Date   CHOL 95 07/05/2016   HDL 32.50 (L) 07/05/2016   LDLCALC 42 07/05/2016   TRIG 103.0 07/05/2016   CHOLHDL 3 07/05/2016       He has had a history of erectile dysfunction and uses Levitra as needed    HYPOGONADISM:  He has been on testosterone supplements since  2011 when his level was mildly low around 300  but no further evaluation done.   He has a relatively low level on using Axiron and had difficulty with the application With starting Androderm at therapeutic doses he had improved testosterone levels and less fatigue Also takes Viagra for erectile dysfunction With 8 mg Androderm his level was upper normal at 690  With  taking 6 mg total currently his level was normal at 420 Fairly compliant with his patches now   Lab Results  Component Value Date   TESTOSTERONE 420.30 04/11/2016     LABS:  Lab on 07/05/2016  Component Date Value Ref Range Status  . Hgb A1c MFr Bld 07/05/2016 6.8* 4.6 - 6.5 % Final  . Sodium 07/05/2016 139  135 - 145 mEq/L Final  . Potassium 07/05/2016 3.9  3.5 - 5.1 mEq/L Final  . Chloride  07/05/2016 106  96 - 112 mEq/L Final  . CO2 07/05/2016 24  19 - 32 mEq/L Final  . Glucose, Bld 07/05/2016 181* 70 - 99 mg/dL Final  . BUN 07/05/2016 12  6 - 23 mg/dL Final  . Creatinine, Ser 07/05/2016 1.27  0.40 - 1.50 mg/dL Final  . Total Bilirubin 07/05/2016 0.3  0.2 - 1.2 mg/dL Final  . Alkaline Phosphatase 07/05/2016 78  39 - 117 U/L Final  . AST 07/05/2016 19  0 - 37 U/L Final  . ALT 07/05/2016 25  0 - 53 U/L Final  . Total Protein 07/05/2016 7.4  6.0 - 8.3 g/dL Final  . Albumin 07/05/2016 4.1  3.5 - 5.2 g/dL Final  . Calcium 07/05/2016 9.1  8.4 - 10.5 mg/dL Final  . GFR 07/05/2016 76.46  >60.00 mL/min Final  . Microalb, Ur 07/05/2016 <0.7  0.0 - 1.9 mg/dL Final  . Creatinine,U 07/05/2016 120.5  mg/dL Final  . Microalb Creat Ratio 07/05/2016 0.6  0.0 - 30.0 mg/g Final  . Cholesterol 07/05/2016 95  0 - 200 mg/dL Final  . Triglycerides 07/05/2016 103.0  0.0 - 149.0 mg/dL Final  . HDL 07/05/2016 32.50* >39.00 mg/dL Final  . VLDL 07/05/2016 20.6  0.0 - 40.0 mg/dL Final  . LDL Cholesterol 07/05/2016 42  0 - 99 mg/dL Final  . Total CHOL/HDL Ratio 07/05/2016 3   Final  . NonHDL 07/05/2016 62.70   Final  . Color, Urine 07/05/2016 YELLOW  Yellow;Lt. Yellow Final  . APPearance 07/05/2016 CLEAR  Clear Final  . Specific Gravity, Urine 07/05/2016 1.010  1.000 - 1.030 Final  . pH 07/05/2016 6.0  5.0 - 8.0 Final  . Total Protein, Urine 07/05/2016 NEGATIVE  Negative Final  . Urine Glucose 07/05/2016 >=1000* Negative Final  . Ketones, ur 07/05/2016 NEGATIVE  Negative Final  . Bilirubin Urine 07/05/2016 NEGATIVE  Negative Final  . Hgb urine dipstick 07/05/2016 NEGATIVE  Negative Final  . Urobilinogen, UA 07/05/2016 0.2  0.0 - 1.0 Final  . Leukocytes, UA 07/05/2016 NEGATIVE  Negative Final  . Nitrite 07/05/2016 NEGATIVE  Negative Final  . WBC, UA 07/05/2016 0-2/hpf  0-2/hpf Final  . RBC / HPF 07/05/2016 none seen  0-2/hpf Final  . Uric Acid, Serum 07/05/2016 3.2* 4.0 - 7.8 mg/dL Final      Physical Examination:  BP 116/80   Pulse 94   Ht '5\' 11"'  (1.803 m)   Wt 283 lb (128.4 kg)   SpO2 97%   BMI 39.47 kg/m        No  edema   ASSESSMENT/PLAN:   Diabetes type 2, with obesity,  BMI 39 See history of present illness for detailed discussion of his current management, blood sugar patterns and problems identified  His blood sugars are well controlled with A1c now 6.8 His main difficulty is with weight gain recently Also not checking enough readings after meals and overall having difficulty finding the time and motivation to exercise Also likely not able to follow diet with holiday season  Encouraged him to check more often and try to work on diet and exercise for getting his weight back down He will be switched to [place] in January because of insurance preference, will use 300 mg and reassured him on the safety of this  HYPERURICEMIA and recurrent episodes of gout. Controlled now with stopping HCTZ and using allopurinol regularly Uric acid level below normal, may be able to do well with 100 mg dose  HYPERTENSION:  His blood pressure is controlled     Patient Instructions  More exercise  Invokana on waking up  Check blood sugars on waking up    Also check blood sugars about 2 hours after a meal and do this after different meals by rotation  Recommended blood sugar levels on waking up is 90-130 and about 2 hours after meal is 130-160  Please bring your blood sugar monitor to each visit, thank you        [facility] 07/11/2016, 4:38 PM

## 2016-07-12 ENCOUNTER — Other Ambulatory Visit: Payer: Self-pay | Admitting: Family Medicine

## 2016-08-08 ENCOUNTER — Encounter: Payer: Self-pay | Admitting: *Deleted

## 2016-08-08 ENCOUNTER — Emergency Department
Admission: EM | Admit: 2016-08-08 | Discharge: 2016-08-08 | Disposition: A | Discharge status: Home / Self Care | Payer: BLUE CROSS/BLUE SHIELD | Source: Home / Self Care | Attending: Family Medicine | Admitting: Family Medicine

## 2016-08-08 DIAGNOSIS — H15101 Unspecified episcleritis, right eye: Secondary | ICD-10-CM | POA: Diagnosis not present

## 2016-08-08 MED ORDER — KETOROLAC TROMETHAMINE 0.5 % OP SOLN: OPHTHALMIC | 0 refills | Status: DC

## 2016-08-08 NOTE — Discharge Instructions (Signed)
Discontinue prednisolone ophthalmic drops for now.

## 2016-08-08 NOTE — ED Provider Notes (Signed)
[name]    CSN: [id number] Arrival date & time: 08/08/16  1613     History   Chief Complaint Chief Complaint  Patient presents with  . Eye Problem    HPI Clifford Kramer is a 51 y.o. male.   Patient was diagnosed with episcleritis of right eye in August 2017 and was prescribed prednisolone drops.   Although improved, he complains of persistent right eye redness and blurred vision, but missed his appointment with eye specialist today.   The history is provided by the patient.  Eye Problem  Location:  Right eye Quality: right eye redness. Severity:  Mild Onset quality:  Gradual Duration:  4 months Timing:  Constant Progression:  Improving Chronicity:  Chronic Context: not foreign body   Relieved by: prednisone. Worsened by:  Nothing Ineffective treatments:  None tried Associated symptoms: blurred vision, inflammation and redness   Associated symptoms: no crusting, no discharge, no double vision, no facial rash, no itching, no photophobia, no scotomas, no swelling and no tearing     Past Medical History:  Diagnosis Date  . Diabetes mellitus   . Gout   . Hyperlipidemia   . Hypertension     Patient Active Problem List   Diagnosis Date Noted  . 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 Type II diabetes mellitus, uncontrolled ([place]) 03/04/2013  . Dyslipidemia (high LDL; low HDL) 03/04/2013  . GOUT, UNSPECIFIED 08/17/2010  . ANKLE PAIN, LEFT 02/24/2010  . Obstructive sleep apnea 12/07/2009  . 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 JOINT EFFUSION, LEFT KNEE 08/18/2008  . ERECTILE DYSFUNCTION 03/07/2007  . Essential hypertension 03/07/2007  . ALLERGIC RHINITIS 03/07/2007    Past Surgical History:  Procedure Laterality Date  . KNEE SURGERY Left 2005       Home Medications    Prior to Admission medications   Medication Sig Start Date End Date Taking? Authorizing Provider  allopurinol (ZYLOPRIM) 300 MG tablet TAKE 1 TABLET (300 MG TOTAL) BY MOUTH  DAILY. 07/11/16   Dorena Cookey, MD  amLODipine (NORVASC) 5 MG tablet TAKE 1 TABLET BY MOUTH EVERY DAY 06/08/16   Elayne Snare, MD  ANDRODERM 2 MG/24HR PT24 USE 1 PATCH PER DAY WITH A 4 MG PATCH 04/11/16   Elayne Snare, MD  ANDRODERM 4 MG/24HR PT24 patch APPLY 2 PATCHES DAILY Patient taking differently: APPLY 1 PATCHES DAILY 12/14/15   Elayne Snare, MD  aspirin 81 MG tablet Take 81 mg by mouth daily.      Historical Provider, MD  BD PEN NEEDLE NANO U/F 32G X 4 MM MISC USE EVERY DAY 04/03/16   Elayne Snare, MD  Blood Glucose Monitoring Suppl (ONETOUCH VERIO IQ SYSTEM) W/DEVICE KIT Use to check blood sugar 2 times daily 05/18/14   Elayne Snare, MD  empagliflozin (JARDIANCE) 25 MG TABS tablet Take 25 mg by mouth daily. 03/30/16   Elayne Snare, MD  esomeprazole (NEXIUM) 40 MG capsule Take 1 capsule (40 mg total) by mouth daily at 12 noon. 07/20/15   Dorena Cookey, MD  fluticasone (FLONASE) 50 MCG/ACT nasal spray PLACE 2 SPRAYS INTO THE NOSE DAILY. 07/20/15   Dorena Cookey, MD  glimepiride (AMARYL) 1 MG tablet Take 1 tablet (1 mg total) by mouth daily with breakfast. 03/13/16   Elayne Snare, MD  JARDIANCE 25 MG TABS tablet TAKE 25 MG BY MOUTH DAILY. 05/22/16   Elayne Snare, MD  ketorolac (ACULAR) 0.5 % ophthalmic solution Place one drop in the left eye 3 to 4 times  daily. 08/08/16   Kandra Nicolas, MD  lisinopril (PRINIVIL,ZESTRIL) 10 MG tablet Take 1 tablet (10 mg total) by mouth daily. 04/20/16   Elayne Snare, MD  lisinopril-hydrochlorothiazide (PRINZIDE,ZESTORETIC) 20-25 MG tablet TAKE 1 TABLET BY MOUTH EVERY MORNING. 07/12/16   Dorena Cookey, MD  metFORMIN (GLUCOPHAGE) 1000 MG tablet TAKE 1 TABLET BY MOUTH TWO TIMES A DAY 07/11/16   Dorena Cookey, MD  olopatadine (PATANOL) 0.1 % ophthalmic solution Place 1 drop into both eyes 2 (two) times daily. 03/29/16   Noland Fordyce, PA-C  ONE TOUCH ULTRA TEST test strip USE AS INSTRUCTED TO CHECK BLOOD SUGAR TWO TIMES A DAY DX CODE E11.9 05/28/16   Elayne Snare, MD  [facility] DELICA  LANCETS FINE MISC Use to check blood sugar 2 times per day, dx code E11.9 05/27/15   Elayne Snare, MD  scopolamine (TRANSDERM-SCOP, 1.5 MG,) 1 MG/3DAYS Place 1 patch (1.5 mg total) onto the skin every 3 (three) days. 05/22/16   Dorena Cookey, MD  simvastatin (ZOCOR) 20 MG tablet TAKE 1 TABLET (20 MG TOTAL) BY MOUTH AT BEDTIME. 06/08/16   Elayne Snare, MD  VIAGRA 100 MG tablet  04/01/14   Historical Provider, MD  VICTOZA 18 MG/3ML SOPN INJECT 1.8 MG UNDER THE SKIN DAILY. 04/18/16   Elayne Snare, MD    Family History Family History  Problem Relation Age of Onset  . Diabetes Mother   . Stomach cancer Other   . Diabetes Maternal Aunt   . Diabetes Maternal Grandmother   . Heart disease Neg Hx   . Colon cancer Neg Hx   . Esophageal cancer Neg Hx   . Rectal cancer Neg Hx     Social History Social History  Substance Use Topics  . Smoking status: Never Smoker  . Smokeless tobacco: Never Used  . Alcohol use Yes     Comment: rare beer     Allergies   Celebrex [celecoxib]; Penicillins; and Sulfonamide derivatives   Review of Systems Review of Systems  Eyes: Positive for blurred vision and redness. Negative for double vision, photophobia, discharge and itching.  All other systems reviewed and are negative.    Physical Exam Triage Vital Signs ED Triage Vitals  Enc Vitals Group     BP 08/08/16 1645 146/88     Pulse Rate 08/08/16 1645 88     Resp 08/08/16 1645 18     Temp 08/08/16 1645 98.1 F (36.7 C)     Temp Source 08/08/16 1645 Oral     SpO2 08/08/16 1645 97 %     Weight 08/08/16 1645 284 lb (128.8 kg)     Height --      Head Circumference --      Peak Flow --      Pain Score 08/08/16 1646 0     Pain Loc --      Pain Edu? --      Excl. in [place]? --    No data found.   Updated Vital Signs BP 146/88 (BP Location: Left Arm)   Pulse 88   Temp 98.1 F (36.7 C) (Oral)   Resp 18   Wt 284 lb (128.8 kg)   SpO2 97%   BMI 39.61 kg/m   Visual Acuity Right Eye  Distance: 20/25 Left Eye Distance: 20/20 Bilateral Distance: 20/20 (w/ glasses)  Right Eye Near:   Left Eye Near:    Bilateral Near:     Physical Exam  Constitutional: He appears well-developed and well-nourished. No  distress.  HENT:  Head: Normocephalic.  Right Ear: External ear normal.  Left Ear: External ear normal.  Nose: Nose normal.  Mouth/Throat: Oropharynx is clear and moist.  Eyes: EOM are normal. Pupils are equal, round, and reactive to light. Right eye exhibits no discharge. Right conjunctiva is injected.    Right lateral conjunctivae mildly injected as noted on diagram.  Fundi benign.  No photophobia.  No lid tenderness or swelling.  Nursing note and vitals reviewed.    UC Treatments / Results  Labs (all labs ordered are listed, but only abnormal results are displayed) Labs Reviewed - No data to display  EKG  EKG Interpretation None       Radiology No results found.  Procedures Procedures (including critical care time)  Medications Ordered in UC Medications - No data to display   Initial Impression / Assessment and Plan / UC Course  I have reviewed the triage vital signs and the nursing notes.  Pertinent labs & imaging results that were available during my care of the patient were reviewed by me and considered in my medical decision making (see chart for details).  Clinical Course   Begin trial of Acular ophthalmic susp TID to QID left eye. Discontinue prednisolone ophthalmic drops for now. Followup with ophthalmologist in one week.  (Although patient lists allergy to Celebrex, he only experienced possibly coincidental itching of the plantar surfaces of his feet, and no rash).    Final Clinical Impressions(s) / UC Diagnoses   Final diagnoses:  Episcleritis of right eye    New Prescriptions New Prescriptions   KETOROLAC (ACULAR) 0.5 % OPHTHALMIC SOLUTION    Place one drop in the left eye 3 to 4 times daily.     Kandra Nicolas,  MD 08/21/16 [phone]

## 2016-08-08 NOTE — ED Triage Notes (Signed)
Pt c/o RT eye redness. He was seen here in 8/17' given gtts, followed up with Eye specialist dx with Episcleritis and given Prednisone. Pt still c/o eye redness and blurry vision. He missed his appt with his Eye specialist today.

## 2016-08-27 ENCOUNTER — Other Ambulatory Visit (INDEPENDENT_AMBULATORY_CARE_PROVIDER_SITE_OTHER): Payer: BLUE CROSS/BLUE SHIELD

## 2016-08-27 DIAGNOSIS — Z0000 Encounter for general adult medical examination without abnormal findings: Secondary | ICD-10-CM | POA: Diagnosis not present

## 2016-08-27 LAB — HEPATIC FUNCTION PANEL
ALT: 24 U/L (ref 0–53)
AST: 18 U/L (ref 0–37)
Albumin: 4.2 g/dL (ref 3.5–5.2)
Alkaline Phosphatase: 78 U/L (ref 39–117)
BILIRUBIN TOTAL: 0.5 mg/dL (ref 0.2–1.2)
Bilirubin, Direct: 0.1 mg/dL (ref 0.0–0.3)
Total Protein: 6.9 g/dL (ref 6.0–8.3)

## 2016-08-27 LAB — BASIC METABOLIC PANEL
BUN: 14 mg/dL (ref 6–23)
CO2: 28 meq/L (ref 19–32)
CREATININE: 1.24 mg/dL (ref 0.40–1.50)
Calcium: 9.4 mg/dL (ref 8.4–10.5)
Chloride: 105 mEq/L (ref 96–112)
GFR: 78.56 mL/min (ref >60.00)
GLUCOSE: 102 mg/dL — AB (ref 70–99)
Potassium: 4.4 mEq/L (ref 3.5–5.1)
Sodium: 141 mEq/L (ref 135–145)

## 2016-08-27 LAB — POC URINALSYSI DIPSTICK (AUTOMATED)
Bilirubin, UA: NEGATIVE
Leukocytes, UA: NEGATIVE
Nitrite, UA: NEGATIVE
Protein, UA: NEGATIVE
RBC UA: NEGATIVE
Spec Grav, UA: 1.02
UROBILINOGEN UA: 1
pH, UA: 6

## 2016-08-27 LAB — CBC WITH DIFFERENTIAL/PLATELET
BASOS PCT: 0.5 % (ref 0.0–3.0)
Basophils Absolute: 0 10*3/uL (ref 0.0–0.1)
EOS ABS: 0.1 10*3/uL (ref 0.0–0.7)
Eosinophils Relative: 2.4 % (ref 0.0–5.0)
HEMATOCRIT: 44.8 % (ref 39.0–52.0)
Hemoglobin: 14.9 g/dL (ref 13.0–17.0)
Lymphocytes Relative: 38.9 % (ref 12.0–46.0)
Lymphs Abs: 2.3 10*3/uL (ref 0.7–4.0)
MCHC: 33.3 g/dL (ref 30.0–36.0)
MCV: 77.5 fl — ABNORMAL LOW (ref 78.0–100.0)
MONO ABS: 0.4 10*3/uL (ref 0.1–1.0)
Monocytes Relative: 7.2 % (ref 3.0–12.0)
NEUTROS ABS: 3 10*3/uL (ref 1.4–7.7)
Neutrophils Relative %: 51 % (ref 43.0–77.0)
PLATELETS: 252 10*3/uL (ref 150.0–400.0)
RBC: 5.78 Mil/uL (ref 4.22–5.81)
RDW: 15.1 % (ref 11.5–15.5)
WBC: 5.9 10*3/uL (ref 4.0–10.5)

## 2016-08-27 LAB — LIPID PANEL
Cholesterol: 109 mg/dL (ref 0–200)
HDL: 34.6 mg/dL — ABNORMAL LOW (ref >39.00)
LDL Cholesterol: 61 mg/dL (ref 0–99)
NONHDL: 74.25
Total CHOL/HDL Ratio: 3
Triglycerides: 68 mg/dL (ref 0.0–149.0)
VLDL: 13.6 mg/dL (ref 0.0–40.0)

## 2016-08-27 LAB — HEMOGLOBIN A1C: Hgb A1c MFr Bld: 6.9 % — ABNORMAL HIGH (ref 4.6–6.5)

## 2016-08-27 LAB — MICROALBUMIN / CREATININE URINE RATIO
Creatinine,U: 179.4 mg/dL
Microalb Creat Ratio: 0.4 mg/g (ref 0.0–30.0)

## 2016-08-27 LAB — PSA: PSA: 0.21 ng/mL (ref 0.10–4.00)

## 2016-08-27 LAB — TSH: TSH: 2.03 u[IU]/mL (ref 0.35–4.50)

## 2016-08-31 ENCOUNTER — Telehealth: Payer: Self-pay | Admitting: Endocrinology

## 2016-08-31 NOTE — Telephone Encounter (Signed)
Clifford Kramer is not going to be covered he has the coupon for the invokana please call in the rx for the [facility] [place]

## 2016-09-02 ENCOUNTER — Other Ambulatory Visit: Payer: Self-pay | Admitting: Endocrinology

## 2016-09-03 ENCOUNTER — Encounter: Payer: Self-pay | Admitting: Endocrinology

## 2016-09-03 ENCOUNTER — Encounter: Payer: Self-pay | Admitting: Family Medicine

## 2016-09-03 ENCOUNTER — Ambulatory Visit (INDEPENDENT_AMBULATORY_CARE_PROVIDER_SITE_OTHER): Payer: BLUE CROSS/BLUE SHIELD | Admitting: Family Medicine

## 2016-09-03 VITALS — BP 130/90 | HR 81 | Temp 97.5°F | Ht 71.0 in | Wt 279.5 lb

## 2016-09-03 DIAGNOSIS — I10 Essential (primary) hypertension: Secondary | ICD-10-CM

## 2016-09-03 DIAGNOSIS — K219 Gastro-esophageal reflux disease without esophagitis: Secondary | ICD-10-CM

## 2016-09-03 DIAGNOSIS — E663 Overweight: Secondary | ICD-10-CM | POA: Diagnosis not present

## 2016-09-03 DIAGNOSIS — E785 Hyperlipidemia, unspecified: Secondary | ICD-10-CM

## 2016-09-03 DIAGNOSIS — E1165 Type 2 diabetes mellitus with hyperglycemia: Secondary | ICD-10-CM

## 2016-09-03 DIAGNOSIS — E784 Other hyperlipidemia: Secondary | ICD-10-CM

## 2016-09-03 DIAGNOSIS — IMO0001 Reserved for inherently not codable concepts without codable children: Secondary | ICD-10-CM

## 2016-09-03 MED ORDER — SIMVASTATIN 20 MG PO TABS: ORAL_TABLET | ORAL | 4 refills | Status: DC

## 2016-09-03 MED ORDER — FLUTICASONE PROPIONATE 50 MCG/ACT NA SUSP: NASAL | 6 refills | Status: DC

## 2016-09-03 MED ORDER — LISINOPRIL 20 MG PO TABS: 20.0000 mg | ORAL_TABLET | Freq: Every day | ORAL | 4 refills | Status: DC

## 2016-09-03 MED ORDER — ESOMEPRAZOLE MAGNESIUM 40 MG PO CPDR: 40.0000 mg | DELAYED_RELEASE_CAPSULE | Freq: Every day | ORAL | 4 refills | Status: DC

## 2016-09-03 MED ORDER — ALLOPURINOL 300 MG PO TABS: 300.0000 mg | ORAL_TABLET | Freq: Every day | ORAL | 3 refills | Status: DC

## 2016-09-03 NOTE — Progress Notes (Signed)
Clifford Kramer is a 51 year old married male nonsmoker who comes in for general physical examination because of a history of metabolic syndrome........ obesity, hypertension, diabetes, hyperlipidemia.  We referred him to Dr. Dwyane Dee many years ago because his blood sugar was out of control. The program to Dr. Dwyane Dee has him have lowered his blood sugar normal. Recent blood sugar 102 A1c 6.9%.  He takes allopurinol 3 mg daily to prevent gout  He takes Norvasc 5 mg along with lisinopril 20 mg daily for blood pressure control. BP is running [id number] to 90 diastolic. We'll increase his lisinopril 20 mg daily.  He takes Nexium 40 mg daily because history of reflux  He uses steroid nasal spray for chronic allergic rhinitis  He's on it testosterone patch from his endocrinologist for low testosterone.  Years old bagger for ED  He takes Zocor 20 mg daily for hyperlipidemia LDL is at goal below 75 external #61.  He's lost 6 pounds since January. He started back on his diet and exercise program. His wife does all the cooking. I wonder if the cooking classes at [facility] might help the wife  He gets routine eye care recent eye check showed no glaucoma or retinopathy. Regular dental care cared colonoscopy 2015 showed a polyp is due to go back for follow-up in 3-5 years.  Vaccinations up-to-date  14 point review of systems otherwise negative  BP 130/90 (BP Location: Left Arm, Patient Position: Sitting, Cuff Size: Normal)   Pulse 81   Temp 97.5 F (36.4 C) (Oral)   Ht 5\' 11"  (1.803 m)   Wt 279 lb 8 oz (126.8 kg)   BMI 38.98 kg/m  Clifford Kramer is well-developed well-nourished overweight male no acute distress vital signs stable he is afebrile except for blood pressure. Examination HEENT were negative neck was supple no adenopathy thyroid normal no carotid bruits cardiopulmonary exam normal abdominal exam normal genitalia normal circumcised male rectum normal stool guaiac-negative prostate normal extremities normal skin  normal peripheral pulses normal. He does have some fungal infection his right great toenail. It was trimmed by me. Advised to soak in file weekly.  #1 diabetes at goal........... continue current program by endocrinology......... nutrition classes for you and your wife at the hospital  #2 hypertension not at goal........ increase ACE inhibitor 20 mg daily  #3 hyperlipidemia at goal......... continue Zocor and aspirin  #4 obesity........... continue diet exercise weight loss hopefully nutrition classes will help that process  #5 history gout....... continue allopurinol  #6 history of low testosterone.......... supplemental patches and follow-up by endocrinology  #7,,,,,,,,, reflux esophagitis,,,,,,,,, continue PPI.

## 2016-09-03 NOTE — Patient Instructions (Signed)
We will set up a nutrition consult for you and your wife with the diabetic treatment center at the hospital  Increase the lisinopril to 20 mg daily  All your other meds stay the same  Call in September 2018 for your physical examination February 2019 with Netta Corrigan. Tell them you want to establish for long-term care and get your physical examination

## 2016-09-03 NOTE — Progress Notes (Signed)
Pre visit review using our clinic review tool, if applicable. No additional management support is needed unless otherwise documented below in the visit note. 

## 2016-09-05 MED ORDER — CANAGLIFLOZIN 300 MG PO TABS: 300.0000 mg | ORAL_TABLET | Freq: Every day | ORAL | 2 refills | Status: DC

## 2016-09-05 NOTE — Telephone Encounter (Signed)
Invokana 300 mg daily, 30 day prescription with 2 refills

## 2016-09-05 NOTE — Telephone Encounter (Signed)
Sent to you for co sig.

## 2016-09-05 NOTE — Telephone Encounter (Signed)
Ok to prescribe invokana? Quantity?

## 2016-09-11 NOTE — Telephone Encounter (Signed)
Okay to send in [place] for patient?  Please advise.

## 2016-09-12 ENCOUNTER — Other Ambulatory Visit: Payer: Self-pay | Admitting: Endocrinology

## 2016-09-17 ENCOUNTER — Other Ambulatory Visit: Payer: Self-pay | Admitting: Endocrinology

## 2016-09-19 ENCOUNTER — Emergency Department
Admission: EM | Admit: 2016-09-19 | Discharge: 2016-09-19 | Disposition: A | Discharge status: Home / Self Care | Payer: BLUE CROSS/BLUE SHIELD | Source: Home / Self Care | Attending: Family Medicine | Admitting: Family Medicine

## 2016-09-19 ENCOUNTER — Encounter: Payer: Self-pay | Admitting: *Deleted

## 2016-09-19 DIAGNOSIS — R69 Illness, unspecified: Secondary | ICD-10-CM

## 2016-09-19 DIAGNOSIS — J111 Influenza due to unidentified influenza virus with other respiratory manifestations: Secondary | ICD-10-CM

## 2016-09-19 MED ORDER — OSELTAMIVIR PHOSPHATE 75 MG PO CAPS: 75.0000 mg | ORAL_CAPSULE | Freq: Two times a day (BID) | ORAL | 0 refills | Status: DC

## 2016-09-19 MED ORDER — GUAIFENESIN-CODEINE 100-10 MG/5ML PO SOLN: ORAL | 0 refills | Status: DC

## 2016-09-19 NOTE — ED Triage Notes (Signed)
Pt c/o body aches, chills, HA and sweats x 1 day. He reports one episode of vomiting and diarrhea.

## 2016-09-19 NOTE — ED Provider Notes (Signed)
[name]    CSN: [id number] Arrival date & time: 09/19/16  1521     History   Chief Complaint Chief Complaint  Patient presents with  . Generalized Body Aches  . Diarrhea    HPI SHONDELL POULSON is a 51 y.o. male.   Yesterday patient developed flu-like illness including myalgias, headache, chills/sweats, fatigue, and cough.  Also has mild nasal congestion and sore throat.  Cough is non-productive and somewhat worse at night.  No pleuritic pain or shortness of breath.  Yesterday he also had several episodes of nausea/vomiting and diarrhea.  The nausea/vomiting have resolved.   The history is provided by the patient.    Past Medical History:  Diagnosis Date  . Diabetes mellitus   . 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 Gout   . Hyperlipidemia   . Hypertension     Patient Active Problem List   Diagnosis Date Noted  . 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 Type II diabetes mellitus, uncontrolled ([place]) 03/04/2013  . Dyslipidemia (high LDL; low HDL) 03/04/2013  . Gout 08/17/2010  . Obstructive sleep apnea 12/07/2009  . 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 ERECTILE DYSFUNCTION 03/07/2007  . Essential hypertension 03/07/2007  . ALLERGIC RHINITIS 03/07/2007    Past Surgical History:  Procedure Laterality Date  . KNEE SURGERY Left 2005       Home Medications    Prior to Admission medications   Medication Sig Start Date End Date Taking? Authorizing Provider  allopurinol (ZYLOPRIM) 300 MG tablet Take 1 tablet (300 mg total) by mouth daily. 09/03/16   Dorena Cookey, MD  amLODipine (NORVASC) 5 MG tablet TAKE 1 TABLET BY MOUTH EVERY DAY 06/08/16   Elayne Snare, MD  ANDRODERM 2 MG/24HR PT24 USE 1 PATCH PER DAY WITH A 4 MG PATCH 04/11/16   Elayne Snare, MD  ANDRODERM 4 MG/24HR PT24 patch APPLY 2 PATCHES DAILY 09/17/16   Elayne Snare, MD  aspirin 81 MG tablet Take 81 mg by mouth daily.      Historical Provider, MD  BD PEN NEEDLE NANO U/F 32G X 4 MM MISC USE EVERY DAY  04/03/16   Elayne Snare, MD  Blood Glucose Monitoring Suppl (ONETOUCH VERIO IQ SYSTEM) W/DEVICE KIT Use to check blood sugar 2 times daily 05/18/14   Elayne Snare, MD  canagliflozin (INVOKANA) 300 MG TABS tablet Take 1 tablet (300 mg total) by mouth daily before breakfast. 09/05/16   Elayne Snare, MD  empagliflozin (JARDIANCE) 25 MG TABS tablet Take 25 mg by mouth daily. 03/30/16   Elayne Snare, MD  esomeprazole (NEXIUM) 40 MG capsule Take 1 capsule (40 mg total) by mouth daily at 12 noon. 09/03/16   Dorena Cookey, MD  fluticasone (FLONASE) 50 MCG/ACT nasal spray PLACE 2 SPRAYS INTO THE NOSE DAILY. 09/03/16   Dorena Cookey, MD  glimepiride (AMARYL) 1 MG tablet TAKE 1 TABLET (1 MG TOTAL) BY MOUTH DAILY WITH BREAKFAST. 09/03/16   Elayne Snare, MD  guaiFENesin-codeine 100-10 MG/5ML syrup Take 57m by mouth at bedtime as needed for cough 09/19/16   SKandra Nicolas MD  JARDIANCE 25 MG TABS tablet TAKE 25 MG BY MOUTH DAILY. 05/22/16   AElayne Snare MD  lisinopril (PRINIVIL,ZESTRIL) 20 MG tablet Take 1 tablet (20 mg total) by mouth daily. 09/03/16   JDorena Cookey MD  metFORMIN (GLUCOPHAGE) 1000 MG tablet TAKE 1 TABLET BY MOUTH TWO TIMES A DAY 07/11/16   JDorena Cookey MD  ONE TOUCH ULTRA TEST  test strip USE AS INSTRUCTED TO CHECK BLOOD SUGAR TWO TIMES A DAY DX CODE E11.9 05/28/16   Elayne Snare, MD  [facility] DELICA LANCETS FINE MISC Use to check blood sugar 2 times per day, dx code E11.9 05/27/15   Elayne Snare, MD  oseltamivir (TAMIFLU) 75 MG capsule Take 1 capsule (75 mg total) by mouth every 12 (twelve) hours. 09/19/16   Kandra Nicolas, MD  scopolamine (TRANSDERM-SCOP, 1.5 MG,) 1 MG/3DAYS Place 1 patch (1.5 mg total) onto the skin every 3 (three) days. 05/22/16   Dorena Cookey, MD  simvastatin (ZOCOR) 20 MG tablet TAKE 1 TABLET (20 MG TOTAL) BY MOUTH AT BEDTIME. 09/03/16   Dorena Cookey, MD  VIAGRA 100 MG tablet  04/01/14   Historical Provider, MD  VICTOZA 18 MG/3ML SOPN INJECT 1.8 MG UNDER THE SKIN DAILY. 04/18/16   Elayne Snare,  MD    Family History Family History  Problem Relation Age of Onset  . Diabetes Mother   . Stomach cancer Other   . Diabetes Maternal Aunt   . Diabetes Maternal Grandmother   . Heart disease Neg Hx   . Colon cancer Neg Hx   . Esophageal cancer Neg Hx   . Rectal cancer Neg Hx     Social History Social History  Substance Use Topics  . Smoking status: Never Smoker  . Smokeless tobacco: Never Used  . Alcohol use Yes     Comment: rare beer     Allergies   Celebrex [celecoxib]; Penicillins; and Sulfonamide derivatives   Review of Systems Review of Systems + sore throat + cough No pleuritic pain No wheezing + nasal congestion + post-nasal drainage No sinus pain/pressure No itchy/red eyes No earache No hemoptysis No SOB No fever, + chills/sweats + nausea, resolved + vomiting, resolved No abdominal pain + diarrhea No urinary symptoms No skin rash + fatigue + myalgias + headache Used OTC meds without relief   Physical Exam Triage Vital Signs ED Triage Vitals  Enc Vitals Group     BP 09/19/16 1544 161/94     Pulse Rate 09/19/16 1544 100     Resp 09/19/16 1544 18     Temp 09/19/16 1544 99.8 F (37.7 C)     Temp Source 09/19/16 1544 Oral     SpO2 09/19/16 1544 97 %     Weight 09/19/16 1544 278 lb (126.1 kg)     Height 09/19/16 1544 '5\' 11"'  (1.803 m)     Head Circumference --      Peak Flow --      Pain Score 09/19/16 1545 0     Pain Loc --      Pain Edu? --      Excl. in [place]? --    No data found.   Updated Vital Signs BP 161/94 (BP Location: Left Arm)   Pulse 100   Temp 99.8 F (37.7 C) (Oral)   Resp 18   Ht '5\' 11"'  (1.803 m)   Wt 278 lb (126.1 kg)   SpO2 97%   BMI 38.77 kg/m   Visual Acuity Right Eye Distance:   Left Eye Distance:   Bilateral Distance:    Right Eye Near:   Left Eye Near:    Bilateral Near:     Physical Exam Nursing notes and Vital Signs reviewed. Appearance:  Patient appears stated age, and in no acute  distress Eyes:  Pupils are equal, round, and reactive to light and accomodation.  Extraocular movement is intact.  Conjunctivae are not inflamed  Ears:  Canals normal.  Tympanic membranes normal.  Nose:  Mildly congested turbinates.  No sinus tenderness.    Pharynx:  Uvula erythematous. Neck:  Supple.  Tender enlarged posterior/lateral nodes are palpated bilaterally  Lungs:  Clear to auscultation.  Breath sounds are equal.  Moving air well. Heart:  Regular rate and rhythm without murmurs, rubs, or gallops.  Abdomen:  Nontender without masses or hepatosplenomegaly.  Bowel sounds are present.  No CVA or flank tenderness.  Extremities:  No edema.  Skin:  No rash present.    UC Treatments / Results  Labs (all labs ordered are listed, but only abnormal results are displayed) Labs Reviewed - No data to display  EKG  EKG Interpretation None       Radiology No results found.  Procedures Procedures (including critical care time)  Medications Ordered in UC Medications - No data to display   Initial Impression / Assessment and Plan / UC Course  I have reviewed the triage vital signs and the nursing notes.  Pertinent labs & imaging results that were available during my care of the patient were reviewed by me and considered in my medical decision making (see chart for details).    Begin Tamiflu.  Rx for Robitussin AC for night time cough.  Take plain guaifenesin (1232m extended release tabs such as Mucinex) twice daily, with plenty of water, for cough and congestion.  Get adequate rest.   May use Afrin nasal spray (or generic oxymetazoline) twice daily for about 5 days and then discontinue.  Also recommend using saline nasal spray several times daily and saline nasal irrigation (AYR is a common brand).   Try warm salt water gargles for sore throat.  Stop all antihistamines for now, and other non-prescription cough/cold preparations. May take Tylenol as needed for fever, headache,  sore throat, etc.  Followup with Family Doctor if not improved in one week.        Final Clinical Impressions(s) / UC Diagnoses   Final diagnoses:  Influenza-like illness    New Prescriptions New Prescriptions   GUAIFENESIN-CODEINE 100-10 MG/5ML SYRUP    Take 173mby mouth at bedtime as needed for cough   OSELTAMIVIR (TAMIFLU) 75 MG CAPSULE    Take 1 capsule (75 mg total) by mouth every 12 (twelve) hours.     StKandra NicolasMD 09/19/16 [phone]

## 2016-09-19 NOTE — Discharge Instructions (Signed)
Take plain guaifenesin (1200mg  extended release tabs such as Mucinex) twice daily, with plenty of water, for cough and congestion.  Get adequate rest.   May use Afrin nasal spray (or generic oxymetazoline) twice daily for about 5 days and then discontinue.  Also recommend using saline nasal spray several times daily and saline nasal irrigation (AYR is a common brand).   Try warm salt water gargles for sore throat.  Stop all antihistamines for now, and other non-prescription cough/cold preparations. May take Tylenol as needed for fever, headache, sore throat, etc.

## 2016-09-27 ENCOUNTER — Encounter: Payer: Self-pay | Admitting: Endocrinology

## 2016-09-27 ENCOUNTER — Other Ambulatory Visit: Payer: Self-pay | Admitting: Endocrinology

## 2016-09-28 ENCOUNTER — Other Ambulatory Visit: Payer: Self-pay

## 2016-09-28 MED ORDER — TESTOSTERONE 2 MG/24HR TD PT24: MEDICATED_PATCH | TRANSDERMAL | 3 refills | Status: DC

## 2016-10-05 ENCOUNTER — Other Ambulatory Visit (INDEPENDENT_AMBULATORY_CARE_PROVIDER_SITE_OTHER): Payer: BLUE CROSS/BLUE SHIELD

## 2016-10-05 DIAGNOSIS — E1165 Type 2 diabetes mellitus with hyperglycemia: Secondary | ICD-10-CM

## 2016-10-05 DIAGNOSIS — E291 Testicular hypofunction: Secondary | ICD-10-CM

## 2016-10-05 LAB — BASIC METABOLIC PANEL
BUN: 11 mg/dL (ref 6–23)
CHLORIDE: 105 meq/L (ref 96–112)
CO2: 29 meq/L (ref 19–32)
CREATININE: 1.24 mg/dL (ref 0.40–1.50)
Calcium: 9.4 mg/dL (ref 8.4–10.5)
GFR: 78.52 mL/min (ref >60.00)
Glucose, Bld: 104 mg/dL — ABNORMAL HIGH (ref 70–99)
Potassium: 4.4 mEq/L (ref 3.5–5.1)
Sodium: 141 mEq/L (ref 135–145)

## 2016-10-05 LAB — TESTOSTERONE: Testosterone: 368.53 ng/dL (ref 300.00–890.00)

## 2016-10-05 LAB — HEMOGLOBIN A1C: Hgb A1c MFr Bld: 6.8 % — ABNORMAL HIGH (ref 4.6–6.5)

## 2016-10-09 ENCOUNTER — Ambulatory Visit (INDEPENDENT_AMBULATORY_CARE_PROVIDER_SITE_OTHER): Payer: BLUE CROSS/BLUE SHIELD | Admitting: Endocrinology

## 2016-10-09 ENCOUNTER — Encounter: Payer: Self-pay | Admitting: Endocrinology

## 2016-10-09 VITALS — BP 126/78 | HR 86 | Temp 97.8°F | Resp 16 | Wt 272.5 lb

## 2016-10-09 DIAGNOSIS — E291 Testicular hypofunction: Secondary | ICD-10-CM | POA: Diagnosis not present

## 2016-10-09 DIAGNOSIS — E1165 Type 2 diabetes mellitus with hyperglycemia: Secondary | ICD-10-CM | POA: Diagnosis not present

## 2016-10-09 DIAGNOSIS — E790 Hyperuricemia without signs of inflammatory arthritis and tophaceous disease: Secondary | ICD-10-CM | POA: Diagnosis not present

## 2016-10-09 NOTE — Progress Notes (Signed)
Patient ID: Clifford Kramer, male   DOB: Jul 03, 1966, 51 y.o.   MRN: [id number]   Reason for Appointment : Followup for Type 2 Diabetes  History of Present Illness          Diagnosis: Type 2 diabetes mellitus, date of diagnosis: 2005     Background information: He was probably started on metformin at the time of diagnosis when his blood sugars were not very high About 5-6 years ago because of higher blood sugars he was also given glipizide  He appeared to have  progression of his diabetes with A1c going up to 8.1% in 01/2013; also had difficulty with losing weight before he was started on Victoza.  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  His A1c is fairly stable at 6.8, previously up to 7%  Current management, blood sugar patterns and problems:  He has lost significant amount of weight since his last visit  He thinks he is trying to exercise much more regularly especially with not working night shifts  Also with his family is trying to watch his diet better and eating more salads  Again did not bring his monitor for download but he thinks that most blood sugars are within range and spends are mostly under 150 except once when he check blood sugar less than 1 hour after eating  He was told that he needed to switch Jardiance to Invokana but could not continue this because of nausea  No hypoglycemia usually but may feel a little hypoglycemic when blood sugars 85   Side effects from medications have been: None       Monitors blood glucose:  infrequently.         Glucometer: One Touch Verio      Blood Glucose readings by recall  Range upto 211 pc Am 119-141, noon 94-130, pc  150  LIFESTYLE: Meals: 3 meals per day. Dinner 10 pm, Bfst 6-7 am; lunch 12 am;  eating out periodically  His breakfast:  eggs and toast or meat  Usually eating fruit and sandwich at lunch, usually a grilled meat at suppertime   Physical activity: exercise: 4/7 days a week, at gym or walking, 45 min     Dietician visit: 05/2013           Wt Readings from Last 3 Encounters:  10/09/16 272 lb 8 oz (123.6 kg)  09/19/16 278 lb (126.1 kg)  09/03/16 279 lb 8 oz (126.8 kg)   Lab Results  Component Value Date   HGBA1C 6.8 (H) 10/05/2016   HGBA1C 6.9 (H) 08/27/2016   HGBA1C 6.8 (H) 07/05/2016   Lab Results  Component Value Date   MICROALBUR <0.7 08/27/2016   LDLCALC 61 08/27/2016   CREATININE 1.24 10/05/2016    Other active problems including new problems are in review of systems    Lab on 10/05/2016  Component Date Value Ref Range Status  . Hgb A1c MFr Bld 10/05/2016 6.8* 4.6 - 6.5 % Final  . Sodium 10/05/2016 141  135 - 145 mEq/L Final  . Potassium 10/05/2016 4.4  3.5 - 5.1 mEq/L Final  . Chloride 10/05/2016 105  96 -  112 mEq/L Final  . CO2 10/05/2016 29  19 - 32 mEq/L Final  . Glucose, Bld 10/05/2016 104* 70 - 99 mg/dL Final  . BUN 10/05/2016 11  6 - 23 mg/dL Final  . Creatinine, Ser 10/05/2016 1.24  0.40 - 1.50 mg/dL Final  . Calcium 10/05/2016 9.4  8.4 - 10.5 mg/dL Final  . GFR 10/05/2016 78.52  >60.00 mL/min Final  . Testosterone 10/05/2016 368.53  300.00 - 890.00 ng/dL Final    Allergies as of 10/09/2016      Reactions   Celebrex [celecoxib] Itching   REACTION: itching   Penicillins Itching   Sulfonamide Derivatives Itching   REACTION: itching      Medication List       Accurate as of 10/09/16  9:19 PM. Always use your most recent med list.          allopurinol 300 MG tablet Commonly known as:  ZYLOPRIM Take 1 tablet (300 mg total) by mouth daily.   amLODipine 5 MG tablet Commonly known as:  NORVASC TAKE 1 TABLET BY MOUTH EVERY DAY   ANDRODERM 4  MG/24HR Pt24 patch Generic drug:  testosterone APPLY 2 PATCHES DAILY   Testosterone 2 MG/24HR Pt24 Commonly known as:  ANDRODERM USE 1 PATCH PER DAY WITH A 4 MG PATCH   aspirin 81 MG tablet Take 81 mg by mouth daily.   BD PEN NEEDLE NANO U/F 32G X 4 MM Misc Generic drug:  Insulin Pen Needle USE EVERY DAY   esomeprazole 40 MG capsule Commonly known as:  NEXIUM Take 1 capsule (40 mg total) by mouth daily at 12 noon.   fluticasone 50 MCG/ACT nasal spray Commonly known as:  FLONASE PLACE 2 SPRAYS INTO THE NOSE DAILY.   glimepiride 1 MG tablet Commonly known as:  AMARYL TAKE 1 TABLET (1 MG TOTAL) BY MOUTH DAILY WITH BREAKFAST.   guaiFENesin-codeine 100-10 MG/5ML syrup Take 50m by mouth at bedtime as needed for cough   JARDIANCE 25 MG Tabs tablet Generic drug:  empagliflozin TAKE 25 MG BY MOUTH DAILY.   lisinopril 20 MG tablet Commonly known as:  PRINIVIL,ZESTRIL Take 1 tablet (20 mg total) by mouth daily.   metFORMIN 1000 MG tablet Commonly known as:  GLUCOPHAGE TAKE 1 TABLET BY MOUTH TWO TIMES A DAY   ONE TOUCH ULTRA TEST test strip Generic drug:  glucose blood USE AS INSTRUCTED TO CHECK BLOOD SUGAR TWO TIMES A DAY DX CODE EG29.5  ONETOUCH DELICA LANCETS FINE Misc Use to check blood sugar 2 times per day, dx code E11.9   ONETOUCH VERIO IQ SYSTEM w/Device Kit Use to check blood sugar 2 times daily   oseltamivir 75 MG capsule Commonly known as:  TAMIFLU Take 1 capsule (75 mg total) by mouth every 12 (twelve) hours.   prednisoLONE acetate 1 % ophthalmic suspension Commonly known as:  PRED FORTE   scopolamine 1 MG/3DAYS Commonly known as:  TRANSDERM-SCOP (1.5 MG) Place 1 patch (1.5 mg total) onto the skin every 3 (three) days.   simvastatin 20 MG tablet Commonly known as:  ZOCOR TAKE 1 TABLET (20 MG TOTAL) BY MOUTH AT BEDTIME.   VIAGRA 100 MG tablet Generic drug:  sildenafil   VICTOZA 18 MG/3ML Sopn Generic drug:  liraglutide INJECT 1.8 MG UNDER THE  SKIN DAILY.       Allergies:  Allergies  Allergen Reactions  . Celebrex [Celecoxib] Itching    REACTION: itching  . Penicillins Itching  . Sulfonamide Derivatives Itching  REACTION: itching    Past Medical History:  Diagnosis Date  . Diabetes mellitus   . 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 Gout   . Hyperlipidemia   . Hypertension     Past Surgical History:  Procedure Laterality Date  . KNEE SURGERY Left 2005    Family History  Problem Relation Age of Onset  . Diabetes Mother   . Stomach cancer Other   . Diabetes Maternal Aunt   . Diabetes Maternal Grandmother   . Heart disease Neg Hx   . Colon cancer Neg Hx   . Esophageal cancer Neg Hx   . Rectal cancer Neg Hx     Social History:  reports that he has never smoked. He has never used smokeless tobacco. He reports that he drinks alcohol. He reports that he does not use drugs.    Review of Systems   He is on treatment for sleep apnea  HYPERTENSION: This is well controlled with 2 drugs including Norvasc and now 20 mg lisinopril HCTZ was stopped with using Jardiance and because of hyperuricemia  BP Readings from Last 3 Encounters:  10/09/16 126/78  09/19/16 161/94  09/03/16 130/90     GOUT: He has had recurrent episodes of gout and various joints including knee and elbow periodically Baseline uric acid was 8.4 done in 2012  Currently is not having an recurrence of his gout, previously would take OTC analgesics only Taking allopurinol 300 mg daily regularly with last uric acid 3.2      Hyperlipidemia: LDL has been controlled, baseline previously 109, has been tolerating simvastatin 20 mg. Has a low HDL  Lab Results  Component Value Date   CHOL 109 08/27/2016   HDL 34.60 (L) 08/27/2016   LDLCALC 61 08/27/2016   TRIG 68.0 08/27/2016   CHOLHDL 3 08/27/2016       HYPOGONADISM:  He has been on testosterone supplements since 2011 when his level was mildly low around 300  but no further evaluation  done.   He has a relatively low level on using Axiron and had difficulty with the application With starting Androderm at therapeutic doses he had improved testosterone levels and less fatigue Also takes Viagra for erectile dysfunction  With 8 mg Androderm his level was upper normal at 690 With  taking 6 mg Androderm total dosage currently his level is slightly lower but still normal at 268 He thinks he is not feeling any more fatigued than usual He may only occasionally forget to put on his patch in the morning when he is rushing out for work    Lab Results  Component Value Date   TESTOSTERONE 368.53 10/05/2016     LABS:  Lab on 10/05/2016  Component Date Value Ref Range Status  . Hgb A1c MFr Bld 10/05/2016 6.8* 4.6 - 6.5 % Final  . Sodium 10/05/2016 141  135 - 145 mEq/L Final  . Potassium 10/05/2016 4.4  3.5 - 5.1 mEq/L Final  . Chloride 10/05/2016 105  96 - 112 mEq/L Final  . CO2 10/05/2016 29  19 - 32 mEq/L Final  . Glucose, Bld 10/05/2016 104* 70 - 99 mg/dL Final  . BUN 10/05/2016 11  6 - 23 mg/dL Final  . Creatinine, Ser 10/05/2016 1.24  0.40 - 1.50 mg/dL Final  . Calcium 10/05/2016 9.4  8.4 - 10.5 mg/dL Final  . GFR 10/05/2016 78.52  >60.00 mL/min Final  . Testosterone 10/05/2016 368.53  300.00 - 890.00 ng/dL Final     Physical  Examination:  BP 126/78 (BP Location: Left Arm, Patient Position: Sitting, Cuff Size: Normal)   Pulse 86   Temp 97.8 F (36.6 C) (Oral)   Resp 16   Wt 272 lb 8 oz (123.6 kg)   SpO2 97%   BMI 38.01 kg/m          ASSESSMENT/PLAN:   Diabetes type 2, with obesity, BMI 39 See history of present illness for detailed discussion of his current management, blood sugar patterns and problems identified  His blood sugars are well controlled with A1c now 6.8 He has now started to lose weight with more consistent exercise and also improving his diet with lower caloric intake He thinks his blood sugars are fairly good at home also had he can  continue the same regimen including Jardiance, co-pay card given   HYPOGONADISM: His symptoms are controlled although his testosterone level is slightly lower below 400, to continue the Androderm with using 4 and 2 mg patches together  HYPERTENSION:  His blood pressure is controlled better with increasing lisinopril     There are no Patient Instructions on file for this visit.    Braxtyn Bojarski 10/09/2016, 9:19 PM

## 2016-10-10 ENCOUNTER — Other Ambulatory Visit: Payer: Self-pay | Admitting: Endocrinology

## 2016-10-16 ENCOUNTER — Other Ambulatory Visit: Payer: Self-pay

## 2016-10-16 MED ORDER — TESTOSTERONE 4 MG/24HR TD PT24: MEDICATED_PATCH | TRANSDERMAL | 1 refills | Status: DC

## 2016-10-24 ENCOUNTER — Other Ambulatory Visit: Payer: Self-pay

## 2016-10-24 ENCOUNTER — Other Ambulatory Visit: Payer: Self-pay | Admitting: Endocrinology

## 2016-10-24 MED ORDER — CANAGLIFLOZIN 300 MG PO TABS: 300.0000 mg | ORAL_TABLET | Freq: Every day | ORAL | 3 refills | Status: DC

## 2016-10-25 ENCOUNTER — Other Ambulatory Visit: Payer: Self-pay

## 2016-10-25 MED ORDER — CANAGLIFLOZIN 300 MG PO TABS: 300.0000 mg | ORAL_TABLET | Freq: Every day | ORAL | 3 refills | Status: DC

## 2016-10-27 ENCOUNTER — Other Ambulatory Visit: Payer: Self-pay | Admitting: Endocrinology

## 2016-12-12 ENCOUNTER — Other Ambulatory Visit: Payer: Self-pay | Admitting: Endocrinology

## 2016-12-19 ENCOUNTER — Other Ambulatory Visit: Payer: Self-pay | Admitting: Endocrinology

## 2016-12-20 ENCOUNTER — Other Ambulatory Visit: Payer: Self-pay | Admitting: Endocrinology

## 2016-12-21 ENCOUNTER — Other Ambulatory Visit: Payer: Self-pay | Admitting: Endocrinology

## 2016-12-26 ENCOUNTER — Telehealth: Payer: Self-pay | Admitting: Endocrinology

## 2016-12-26 ENCOUNTER — Other Ambulatory Visit: Payer: Self-pay | Admitting: Endocrinology

## 2016-12-26 MED ORDER — TESTOSTERONE 2 MG/24HR TD PT24: MEDICATED_PATCH | TRANSDERMAL | 1 refills | Status: DC

## 2016-12-26 NOTE — Telephone Encounter (Signed)
Patient called in and stated that [facility] does not have either of the androderm prescriptions.. Please resubmit

## 2016-12-27 ENCOUNTER — Other Ambulatory Visit: Payer: Self-pay | Admitting: Endocrinology

## 2016-12-27 NOTE — Telephone Encounter (Signed)
Called patient to let him know that I sent both of his refills for his Androderm 2mg  and 4mg  yesterday.

## 2016-12-27 NOTE — Telephone Encounter (Signed)
Patient called to advise that the pharmacy said they have the ANDRODERM 4 MG/24HR PT24 patch but they also need the 2MG . Please call and leave a voicemail at the  [id number] - [place], [place] [phone] (Phone) [phone] (Fax)   Pharmacy today. Please call patient to advise.

## 2016-12-31 NOTE — Telephone Encounter (Signed)
Called patient just to make sure he was able to get his Androderm 4mg  and 2mg  and he said he was able to get his meds.

## 2017-01-23 ENCOUNTER — Other Ambulatory Visit: Payer: Self-pay | Admitting: Endocrinology

## 2017-02-08 ENCOUNTER — Other Ambulatory Visit (INDEPENDENT_AMBULATORY_CARE_PROVIDER_SITE_OTHER): Payer: BLUE CROSS/BLUE SHIELD

## 2017-02-08 DIAGNOSIS — E291 Testicular hypofunction: Secondary | ICD-10-CM | POA: Diagnosis not present

## 2017-02-08 DIAGNOSIS — E790 Hyperuricemia without signs of inflammatory arthritis and tophaceous disease: Secondary | ICD-10-CM

## 2017-02-08 DIAGNOSIS — E1165 Type 2 diabetes mellitus with hyperglycemia: Secondary | ICD-10-CM | POA: Diagnosis not present

## 2017-02-08 LAB — COMPREHENSIVE METABOLIC PANEL
ALBUMIN: 4 g/dL (ref 3.5–5.2)
ALT: 20 U/L (ref 0–53)
AST: 17 U/L (ref 0–37)
Alkaline Phosphatase: 74 U/L (ref 39–117)
BUN: 13 mg/dL (ref 6–23)
CALCIUM: 8.9 mg/dL (ref 8.4–10.5)
CHLORIDE: 107 meq/L (ref 96–112)
CO2: 22 meq/L (ref 19–32)
CREATININE: 1.27 mg/dL (ref 0.40–1.50)
GFR: 76.29 mL/min (ref >60.00)
Glucose, Bld: 166 mg/dL — ABNORMAL HIGH (ref 70–99)
POTASSIUM: 3.9 meq/L (ref 3.5–5.1)
SODIUM: 139 meq/L (ref 135–145)
Total Bilirubin: 0.3 mg/dL (ref 0.2–1.2)
Total Protein: 6.9 g/dL (ref 6.0–8.3)

## 2017-02-08 LAB — TESTOSTERONE: TESTOSTERONE: 283.4 ng/dL — AB (ref 300.00–890.00)

## 2017-02-08 LAB — URIC ACID: URIC ACID, SERUM: 3.3 mg/dL — AB (ref 4.0–7.8)

## 2017-02-08 LAB — HEMOGLOBIN A1C: Hgb A1c MFr Bld: 6.4 % (ref 4.6–6.5)

## 2017-02-12 ENCOUNTER — Ambulatory Visit (INDEPENDENT_AMBULATORY_CARE_PROVIDER_SITE_OTHER): Payer: BLUE CROSS/BLUE SHIELD | Admitting: Endocrinology

## 2017-02-12 ENCOUNTER — Encounter: Payer: Self-pay | Admitting: Endocrinology

## 2017-02-12 VITALS — BP 140/84 | HR 85 | Ht 71.0 in | Wt 278.2 lb

## 2017-02-12 DIAGNOSIS — E1165 Type 2 diabetes mellitus with hyperglycemia: Secondary | ICD-10-CM

## 2017-02-12 DIAGNOSIS — I10 Essential (primary) hypertension: Secondary | ICD-10-CM

## 2017-02-12 DIAGNOSIS — E790 Hyperuricemia without signs of inflammatory arthritis and tophaceous disease: Secondary | ICD-10-CM

## 2017-02-12 DIAGNOSIS — E291 Testicular hypofunction: Secondary | ICD-10-CM | POA: Diagnosis not present

## 2017-02-12 NOTE — Progress Notes (Signed)
Patient ID: Clifford Kramer, male   DOB: 15-Mar-1966, 51 y.o.   MRN: [id number]   Reason for Appointment : Followup for Type 2 Diabetes  History of Present Illness          Diagnosis: Type 2 diabetes mellitus, date of diagnosis: 2005     Background information: He was probably started on metformin at the time of diagnosis when his blood sugars were not very high About 5-6 years ago because of higher blood sugars he was also given glipizide  He appeared to have  progression of his diabetes with A1c going up to 8.1% in 01/2013; also had difficulty with losing weight before he was started on Victoza.  With this his blood sugars were significantly better His glipizide was reduced to 2.5 mg in 10/14 because of rare hypoglycemia He has taken 1.8 mg of Victoza since 7/15   RECENT history:   Antihyperglycemic drugs : Victoza 1.8 mg, Metformin 2 g and Amaryl 1 mg am, Invokana 300 mg dail  His A1c is improved at 6.4%, previously 6.8  Current management, blood sugar patterns and problems:  He has been switched to Invokana instead of Jardiance because of insurance preference  However his blood sugars appear to be out of by A1c  This is despite his not being able to exercise as much and he thinks he has not been able to consistently watch his diet for various reasons  He has gained back 6 pounds  Again he is checking his blood sugars mostly in the mornings and not after meals usually and overall median 124  He says he gets some nausea with taking Invokana before breakfast and may not always take it   Side effects from medications have been: None       Monitors blood glucose:  infrequently.         Glucometer: One Touch Verio      Blood Glucose readings by download:  MORNING range 96-1 64 with median about 125 Nonfasting 71-1 70 with only one high reading at bedtime  LIFESTYLE: Meals: 3 meals per day. Dinner 10 pm, Bfst 6-7 am; lunch 12 am;  eating out periodically  His  breakfast:  eggs and toast or meat  Usually eating fruit and sandwich at lunch, usually a grilled meat at suppertime   Physical activity: exercise: 2-4/7 days a week, at gym or walking, 45 min     Dietician visit: 05/2013           Wt Readings from Last 3 Encounters:  02/12/17 278 lb 3.2 oz (126.2 kg)  10/09/16 272 lb 8 oz (123.6 kg)  09/19/16 278 lb (126.1 kg)   Lab Results  Component Value Date   HGBA1C 6.4 02/08/2017   HGBA1C 6.8 (H) 10/05/2016   HGBA1C 6.9 (H) 08/27/2016   Lab Results  Component Value Date   MICROALBUR <0.7 08/27/2016   LDLCALC 61 08/27/2016   CREATININE 1.27 02/08/2017    Other active problems including new problems are in review of systems    Lab on 02/08/2017  Component Date Value Ref Range Status  . Hgb A1c MFr Bld 02/08/2017 6.4  4.6 - 6.5 % Final   Glycemic Control Guidelines for People with Diabetes:Non Diabetic:  <6%Goal of Therapy: <7%Additional Action Suggested:  >8%   . Sodium 02/08/2017 139  135 - 145 mEq/L Final  . Potassium 02/08/2017 3.9  3.5 - 5.1 mEq/L Final  . Chloride 02/08/2017 107  96 - 112 mEq/L Final  .  CO2 02/08/2017 22  19 - 32 mEq/L Final  . Glucose, Bld 02/08/2017 166* 70 - 99 mg/dL Final  . BUN 02/08/2017 13  6 - 23 mg/dL Final  . Creatinine, Ser 02/08/2017 1.27  0.40 - 1.50 mg/dL Final  . Total Bilirubin 02/08/2017 0.3  0.2 - 1.2 mg/dL Final  . Alkaline Phosphatase 02/08/2017 74  39 - 117 U/L Final  . AST 02/08/2017 17  0 - 37 U/L Final  . ALT 02/08/2017 20  0 - 53 U/L Final  . Total Protein 02/08/2017 6.9  6.0 - 8.3 g/dL Final  . Albumin 02/08/2017 4.0  3.5 - 5.2 g/dL Final  . Calcium 02/08/2017 8.9  8.4 - 10.5 mg/dL Final  . GFR 02/08/2017 76.29  >60.00 mL/min Final  . Testosterone 02/08/2017 283.40* 300.00 - 890.00 ng/dL Final  . Uric Acid, Serum 02/08/2017 3.3* 4.0 - 7.8 mg/dL Final    Allergies as of 02/12/2017      Reactions   Celebrex [celecoxib] Itching   REACTION: itching   Penicillins Itching    Sulfonamide Derivatives Itching   REACTION: itching      Medication List       Accurate as of 02/12/17 11:59 PM. Always use your most recent med list.          allopurinol 300 MG tablet Commonly known as:  ZYLOPRIM Take 1 tablet (300 mg total) by mouth daily.   amLODipine 5 MG tablet Commonly known as:  NORVASC TAKE 1 TABLET BY MOUTH EVERY DAY   ANDRODERM 4 MG/24HR Pt24 patch Generic drug:  testosterone USE EVERY DAY   ANDRODERM 2 MG/24HR Pt24 Generic drug:  Testosterone APPLY 1 PATCH EVERY DAY   aspirin 81 MG tablet Take 81 mg by mouth daily.   BD PEN NEEDLE NANO U/F 32G X 4 MM Misc Generic drug:  Insulin Pen Needle USE EVERY DAY   canagliflozin 300 MG Tabs tablet Commonly known as:  INVOKANA Take 1 tablet (300 mg total) by mouth daily before breakfast.   esomeprazole 40 MG capsule Commonly known as:  NEXIUM Take 1 capsule (40 mg total) by mouth daily at 12 noon.   fluticasone 50 MCG/ACT nasal spray Commonly known as:  FLONASE PLACE 2 SPRAYS INTO THE NOSE DAILY.   glimepiride 1 MG tablet Commonly known as:  AMARYL TAKE 1 TABLET (1 MG TOTAL) BY MOUTH DAILY WITH BREAKFAST.   lisinopril 20 MG tablet Commonly known as:  PRINIVIL,ZESTRIL Take 1 tablet (20 mg total) by mouth daily.   lisinopril 10 MG tablet Commonly known as:  PRINIVIL,ZESTRIL TAKE 1 TABLET (10 MG TOTAL) BY MOUTH DAILY.   metFORMIN 1000 MG tablet Commonly known as:  GLUCOPHAGE TAKE 1 TABLET BY MOUTH TWO TIMES A DAY   ONE TOUCH ULTRA TEST test strip Generic drug:  glucose blood USE AS INSTRUCTED TO CHECK BLOOD SUGAR TWO TIMES A DAY DX CODE X43.5   ONETOUCH DELICA LANCETS FINE Misc Use to check blood sugar 2 times per day, dx code E11.9   ONETOUCH VERIO IQ SYSTEM w/Device Kit Use to check blood sugar 2 times daily   prednisoLONE acetate 1 % ophthalmic suspension Commonly known as:  PRED FORTE   simvastatin 20 MG tablet Commonly known as:  ZOCOR TAKE 1 TABLET (20 MG TOTAL) BY MOUTH  AT BEDTIME.   VIAGRA 100 MG tablet Generic drug:  sildenafil   VICTOZA 18 MG/3ML Sopn Generic drug:  liraglutide INJECT 1.8 MG UNDER THE SKIN DAILY.  Allergies:  Allergies  Allergen Reactions  . Celebrex [Celecoxib] Itching    REACTION: itching  . Penicillins Itching  . Sulfonamide Derivatives Itching    REACTION: itching    Past Medical History:  Diagnosis Date  . Diabetes mellitus   . 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 Gout   . Hyperlipidemia   . Hypertension     Past Surgical History:  Procedure Laterality Date  . KNEE SURGERY Left 2005    Family History  Problem Relation Age of Onset  . Diabetes Mother   . Stomach cancer Other   . Diabetes Maternal Aunt   . Diabetes Maternal Grandmother   . Heart disease Neg Hx   . Colon cancer Neg Hx   . Esophageal cancer Neg Hx   . Rectal cancer Neg Hx     Social History:  reports that he has never smoked. He has never used smokeless tobacco. He reports that he drinks alcohol. He reports that he does not use drugs.    Review of Systems   He is on treatment for sleep apnea  HYPERTENSION: This is well controlled with 2 drugs including Norvasc and now 10 mg lisinopril HCTZ was stopped with using SGLT 2 drugs and because of hyperuricemia  Recent home blood pressure: 139/85  BP Readings from Last 3 Encounters:  02/12/17 140/84  10/09/16 126/78  09/19/16 161/94     GOUT: He has had Previous recurrent episodes of gout and various joints including knee and elbow periodically Baseline uric acid was 8.4 done in 2012  Currently is not having an recurrence of his gout although he has occasional transient pains in his first MCP joints which he thinks his gout Taking allopurinol 300 mg daily regularly with last uric acid 3.3      Hyperlipidemia: LDL has been controlled, baseline previously 109, has been tolerating simvastatin 20 mg. Has a low HDL  Lab Results  Component Value Date   CHOL 109 08/27/2016    HDL 34.60 (L) 08/27/2016   LDLCALC 61 08/27/2016   TRIG 68.0 08/27/2016   CHOLHDL 3 08/27/2016       HYPOGONADISM:  He has been on testosterone supplements since 2011 when his level was mildly low around 300  but no further evaluation done.   He has a relatively low level on using Axiron and had difficulty with the application With Androderm at therapeutic doses he had improved testosterone levels and less fatigue Also takes Viagra for erectile dysfunction  With 8 mg Androderm his level was upper normal at 690 With  taking 6 mg Androderm total dosage his level has now gone back down  He thinks he is now feeling more fatigued than usual, also has less motivation and decreased libido He did use his patch is consistently before his lab work except a week before    Lab Results  Component Value Date   TESTOSTERONE 283.40 (L) 02/08/2017     LABS:  Lab on 02/08/2017  Component Date Value Ref Range Status  . Hgb A1c MFr Bld 02/08/2017 6.4  4.6 - 6.5 % Final   Glycemic Control Guidelines for People with Diabetes:Non Diabetic:  <6%Goal of Therapy: <7%Additional Action Suggested:  >8%   . Sodium 02/08/2017 139  135 - 145 mEq/L Final  . Potassium 02/08/2017 3.9  3.5 - 5.1 mEq/L Final  . Chloride 02/08/2017 107  96 - 112 mEq/L Final  . CO2 02/08/2017 22  19 - 32 mEq/L Final  . Glucose, Bld  02/08/2017 166* 70 - 99 mg/dL Final  . BUN 02/08/2017 13  6 - 23 mg/dL Final  . Creatinine, Ser 02/08/2017 1.27  0.40 - 1.50 mg/dL Final  . Total Bilirubin 02/08/2017 0.3  0.2 - 1.2 mg/dL Final  . Alkaline Phosphatase 02/08/2017 74  39 - 117 U/L Final  . AST 02/08/2017 17  0 - 37 U/L Final  . ALT 02/08/2017 20  0 - 53 U/L Final  . Total Protein 02/08/2017 6.9  6.0 - 8.3 g/dL Final  . Albumin 02/08/2017 4.0  3.5 - 5.2 g/dL Final  . Calcium 02/08/2017 8.9  8.4 - 10.5 mg/dL Final  . GFR 02/08/2017 76.29  >60.00 mL/min Final  . Testosterone 02/08/2017 283.40* 300.00 - 890.00 ng/dL Final  . Uric  Acid, Serum 02/08/2017 3.3* 4.0 - 7.8 mg/dL Final     Physical Examination:  BP 140/84   Pulse 85   Ht _0  (1.803 m)   Wt 278 lb 3.2 oz (126.2 kg)   SpO2 98%   BMI 38.80 kg/m       No ankle edema Repeat blood pressure was with large cuff   ASSESSMENT/PLAN:   Diabetes type 2, with obesity, BMI 39 See history of present illness for detailed discussion of his current management, blood sugar patterns and problems identified  His blood sugars are well controlled with A1c now 6.4 He may be benefiting from switching to Invokana since overall his exercise regimen and has not been consistent and he has gained back some weight Will have sporadic high readings after meals but currently not able to take Invokana consistently because of nausea  Recommend that he try to take Invokana with meals instead of before breakfast He will switch to, when he finishes his current supply He would try to be more consistent regular exercise and controlling calories and carbohydrates Discussed blood sugar targets and need to check more often after meals  HYPOGONADISM: His symptoms are not controlled and his testosterone level has gone down again with 4 mg of Androderm He'll go back to 8 mg using 4 mg, 2 patches and he will need this on his new prescription Also may possibly be able to get better endogenous testosterone if he is exercising regularly and losing weight, discussed  Episodes of gout: Controlled with allopurinol, he thinks he is still getting occasional minor episodes in his hand but not clear if this is truly gout, to continue 300 mg for now as he is tolerating  HYPERTENSION:  His blood pressure is not is well-controlled and he may be better off with 20 mg which he was changed to  Occasional cough: Discussed that he may consider side effect of lisinopril  Total visit time for evaluation and management of multiple problems, counseling, review of labs, medications = 25 minutes  Patient  Instructions  Invokana with food, next Rx will be combo  Patches 73m daily  Lisinopril 280mdaily     Linzy Darling 02/13/2017, 7:51 AM

## 2017-02-12 NOTE — Patient Instructions (Addendum)
Invokana with food, next Rx will be combo  Patches 8mg  daily  Lisinopril 20mg  daily

## 2017-02-26 ENCOUNTER — Other Ambulatory Visit: Payer: Self-pay | Admitting: Endocrinology

## 2017-03-06 ENCOUNTER — Encounter: Payer: Self-pay | Admitting: Endocrinology

## 2017-03-08 ENCOUNTER — Other Ambulatory Visit: Payer: Self-pay

## 2017-03-08 ENCOUNTER — Telehealth: Payer: Self-pay | Admitting: Endocrinology

## 2017-03-08 MED ORDER — TESTOSTERONE 4 MG/24HR TD PT24: MEDICATED_PATCH | TRANSDERMAL | 1 refills | Status: DC

## 2017-03-08 NOTE — Telephone Encounter (Signed)
Patient called in reference to e-mail sent via [facility] chart. Rx needs to go to  [id number] - [place], [place] [phone] (Phone) [phone] (Fax)   Call patient with any questions.

## 2017-03-08 NOTE — Telephone Encounter (Signed)
Called patient and let him know that I faxed his prescription over to the [facility].

## 2017-03-08 NOTE — Telephone Encounter (Signed)
Routing to you °

## 2017-04-02 ENCOUNTER — Other Ambulatory Visit: Payer: Self-pay | Admitting: Endocrinology

## 2017-04-08 ENCOUNTER — Encounter: Payer: Self-pay | Admitting: Endocrinology

## 2017-04-09 NOTE — Telephone Encounter (Signed)
Pharmacy called in reference to Rx for  Viola 18 MG/3ML SOPN for patient needing to be a 90 day supply instead of 30 so insurance will pay. Please call pharmacy and advise. Pharmacy stated patient is out of medication.

## 2017-04-11 ENCOUNTER — Other Ambulatory Visit: Payer: Self-pay | Admitting: Endocrinology

## 2017-04-11 ENCOUNTER — Other Ambulatory Visit: Payer: Self-pay

## 2017-04-19 ENCOUNTER — Other Ambulatory Visit: Payer: Self-pay

## 2017-04-19 ENCOUNTER — Telehealth: Payer: Self-pay | Admitting: Endocrinology

## 2017-04-19 ENCOUNTER — Encounter: Payer: Self-pay | Admitting: Family Medicine

## 2017-04-19 MED ORDER — LIRAGLUTIDE 18 MG/3ML ~~LOC~~ SOPN: PEN_INJECTOR | SUBCUTANEOUS | 1 refills | Status: DC

## 2017-04-19 NOTE — Telephone Encounter (Signed)
MEDICATION: VICTOZA 18 MG/3ML SOPN  PHARMACY:   [id number] - [place], [place] (Phone) [phone] (Fax)   IS THIS A 90 DAY SUPPLY : yes  IS PATIENT OUT OF MEDICATION: yes

## 2017-04-19 NOTE — Telephone Encounter (Signed)
Called patient and let him know that I have sent over this [facility] [place].

## 2017-05-27 ENCOUNTER — Other Ambulatory Visit (INDEPENDENT_AMBULATORY_CARE_PROVIDER_SITE_OTHER): Payer: BLUE CROSS/BLUE SHIELD

## 2017-05-27 ENCOUNTER — Other Ambulatory Visit: Payer: BLUE CROSS/BLUE SHIELD

## 2017-05-27 DIAGNOSIS — E1165 Type 2 diabetes mellitus with hyperglycemia: Secondary | ICD-10-CM | POA: Diagnosis not present

## 2017-05-27 DIAGNOSIS — E291 Testicular hypofunction: Secondary | ICD-10-CM

## 2017-05-27 LAB — TESTOSTERONE: Testosterone: 327.45 ng/dL (ref 300.00–890.00)

## 2017-05-27 LAB — BASIC METABOLIC PANEL
BUN: 16 mg/dL (ref 6–23)
CALCIUM: 9.6 mg/dL (ref 8.4–10.5)
CO2: 24 mEq/L (ref 19–32)
CREATININE: 1.19 mg/dL (ref 0.40–1.50)
Chloride: 106 mEq/L (ref 96–112)
GFR: 82.14 mL/min (ref >60.00)
GLUCOSE: 103 mg/dL — AB (ref 70–99)
Potassium: 4.3 mEq/L (ref 3.5–5.1)
SODIUM: 141 meq/L (ref 135–145)

## 2017-05-27 LAB — HEMOGLOBIN A1C: Hgb A1c MFr Bld: 6.7 % — ABNORMAL HIGH (ref 4.6–6.5)

## 2017-05-29 ENCOUNTER — Ambulatory Visit: Payer: BLUE CROSS/BLUE SHIELD | Admitting: Endocrinology

## 2017-05-29 ENCOUNTER — Ambulatory Visit (INDEPENDENT_AMBULATORY_CARE_PROVIDER_SITE_OTHER): Payer: BLUE CROSS/BLUE SHIELD | Admitting: Endocrinology

## 2017-05-29 ENCOUNTER — Encounter: Payer: Self-pay | Admitting: Endocrinology

## 2017-05-29 VITALS — BP 128/76 | HR 95 | Ht 71.0 in | Wt 279.0 lb

## 2017-05-29 DIAGNOSIS — Z23 Encounter for immunization: Secondary | ICD-10-CM | POA: Diagnosis not present

## 2017-05-29 DIAGNOSIS — E1165 Type 2 diabetes mellitus with hyperglycemia: Secondary | ICD-10-CM | POA: Diagnosis not present

## 2017-05-29 DIAGNOSIS — E291 Testicular hypofunction: Secondary | ICD-10-CM

## 2017-05-29 MED ORDER — LISINOPRIL 20 MG PO TABS: 20.0000 mg | ORAL_TABLET | Freq: Every day | ORAL | 4 refills | Status: DC

## 2017-05-29 MED ORDER — CANAGLIFLOZIN-METFORMIN HCL ER 150-1000 MG PO TB24: 2.0000 | ORAL_TABLET | Freq: Every day | ORAL | 4 refills | Status: DC

## 2017-05-29 NOTE — Progress Notes (Signed)
Patient ID: Clifford Kramer, male   DOB: July 25, 1966, 51 y.o.   MRN: [id number]   Reason for Appointment : Followup for Type 2 Diabetes  History of Present Illness          Diagnosis: Type 2 diabetes mellitus, date of diagnosis: 2005     Background information: He was probably started on metformin at the time of diagnosis when his blood sugars were not very high About 5-6 years ago because of higher blood sugars he was also given glipizide  He appeared to have  progression of his diabetes with A1c going up to 8.1% in 01/2013; also had difficulty with losing weight before he was started on Victoza.  With this his blood sugars were significantly better His glipizide was reduced to 2.5 mg in 10/14 because of rare hypoglycemia He has taken 1.8 mg of Victoza since 7/15   RECENT history:   Antihyperglycemic drugs : Victoza 1.8 mg, Metformin 2 g and Amaryl 1 mg am, Invokana 300 mg daily  His A1c is consistently under 7%, now 6.7, previously was slightly better at at 6.4%  Current management, blood sugar patterns and problems:  He has taken his Invokana more regularly since his last visit since he can tolerate it better with food in the morning  Did not bring his monitor but he does not think his blood sugars are high usually, may be slightly higher fasting than before; lab glucose was 103 late morning  He is still doing fairly well with his exercise regimen but weight has not come down  Usually trying to eat low fat meals and avoiding high carbohydrate and high sugar intake  No side effects with Victoza   Side effects from medications have been: None       Monitors blood glucose:  infrequently.         Glucometer: One Touch Verio      Blood Glucose readings by recall:  MORNING range 125-135 once 195  Nonfasting about 160, 2-3 hours after eating   LIFESTYLE: Meals: 3 meals per day. Dinner 10 pm, Bfst 6-7 am; lunch 12 am;  eating out periodically  His breakfast:  eggs and  toast or meat  Usually eating fruit and sandwich at lunch, usually a grilled meat at suppertime   Physical activity: exercise: 4/7 days a week, at gym or walking, 45 min     Dietician visit: 05/2013           Wt Readings from Last 3 Encounters:  05/29/17 279 lb (126.6 kg)  02/12/17 278 lb 3.2 oz (126.2 kg)  10/09/16 272 lb 8 oz (123.6 kg)   Lab Results  Component Value Date   HGBA1C 6.7 (H) 05/27/2017   HGBA1C 6.4 02/08/2017   HGBA1C 6.8 (H) 10/05/2016   Lab Results  Component Value Date   MICROALBUR <0.7 08/27/2016   [place] 61 08/27/2016   CREATININE 1.19 05/27/2017    Other active problems including new problems are in review of systems    Lab on 05/27/2017  Component Date Value Ref Range Status  . Hgb A1c MFr Bld 05/27/2017 6.7* 4.6 - 6.5 % Final   Glycemic Control Guidelines for People with Diabetes:Non Diabetic:  <6%Goal of Therapy: <7%Additional Action Suggested:  >8%   . Sodium 05/27/2017 141  135 - 145 mEq/L Final  . Potassium 05/27/2017 4.3  3.5 - 5.1 mEq/L Final  . Chloride 05/27/2017 106  96 - 112 mEq/L Final  . CO2 05/27/2017 24  19 - 32 mEq/L Final  . Glucose, Bld 05/27/2017 103* 70 - 99 mg/dL Final  . BUN 05/27/2017 16  6 - 23 mg/dL Final  . Creatinine, Ser 05/27/2017 1.19  0.40 - 1.50 mg/dL Final  . Calcium 05/27/2017 9.6  8.4 - 10.5 mg/dL Final  . GFR 05/27/2017 82.14  >60.00 mL/min Final  . Testosterone 05/27/2017 327.45  300.00 - 890.00 ng/dL Final    Allergies as of 05/29/2017      Reactions   Celebrex [celecoxib] Itching   REACTION: itching   Penicillins Itching   Sulfonamide Derivatives Itching   REACTION: itching      Medication List       Accurate as of 05/29/17 10:04 AM. Always use your most recent med list.          allopurinol 300 MG tablet Commonly known as:  ZYLOPRIM Take 1 tablet (300 mg total) by mouth daily.   amLODipine 5 MG tablet Commonly known as:  NORVASC TAKE 1 TABLET BY MOUTH EVERY DAY   ANDRODERM 2 MG/24HR  Pt24 Generic drug:  Testosterone APPLY 1 PATCH EVERY DAY   testosterone 4 MG/24HR Pt24 patch Commonly known as:  ANDRODERM USE 8 MG EVERY DAY   aspirin 81 MG tablet Take 81 mg by mouth daily.   BD PEN NEEDLE NANO U/F 32G X 4 MM Misc Generic drug:  Insulin Pen Needle USE EVERY DAY   canagliflozin 300 MG Tabs tablet Commonly known as:  INVOKANA Take 1 tablet (300 mg total) by mouth daily before breakfast.   esomeprazole 40 MG capsule Commonly known as:  NEXIUM Take 1 capsule (40 mg total) by mouth daily at 12 noon.   fluticasone 50 MCG/ACT nasal spray Commonly known as:  FLONASE PLACE 2 SPRAYS INTO THE NOSE DAILY.   glimepiride 1 MG tablet Commonly known as:  AMARYL TAKE 1 TABLET (1 MG TOTAL) BY MOUTH DAILY WITH BREAKFAST.   liraglutide 18 MG/3ML Sopn Commonly known as:  VICTOZA INJECT 1.8 MG UNDER THE SKIN DAILY.   lisinopril 20 MG tablet Commonly known as:  PRINIVIL,ZESTRIL Take 1 tablet (20 mg total) by mouth daily.   lisinopril 10 MG tablet Commonly known as:  PRINIVIL,ZESTRIL TAKE 1 TABLET (10 MG TOTAL) BY MOUTH DAILY.   metFORMIN 1000 MG tablet Commonly known as:  GLUCOPHAGE TAKE 1 TABLET BY MOUTH TWO TIMES A DAY   ONE TOUCH ULTRA TEST test strip Generic drug:  glucose blood USE AS INSTRUCTED TO CHECK BLOOD SUGAR TWO TIMES A DAY DX CODE Z61.0   ONETOUCH DELICA LANCETS FINE Misc Use to check blood sugar 2 times per day, dx code E11.9   ONETOUCH VERIO IQ SYSTEM w/Device Kit Use to check blood sugar 2 times daily   prednisoLONE acetate 1 % ophthalmic suspension Commonly known as:  PRED FORTE   simvastatin 20 MG tablet Commonly known as:  ZOCOR TAKE 1 TABLET (20 MG TOTAL) BY MOUTH AT BEDTIME.   VIAGRA 100 MG tablet Generic drug:  sildenafil       Allergies:  Allergies  Allergen Reactions  . Celebrex [Celecoxib] Itching    REACTION: itching  . Penicillins Itching  . Sulfonamide Derivatives Itching    REACTION: itching    Past Medical  History:  Diagnosis Date  . Diabetes mellitus   . 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 Gout   . Hyperlipidemia   . Hypertension     Past Surgical History:  Procedure Laterality Date  . KNEE SURGERY Left 2005  Family History  Problem Relation Age of Onset  . Diabetes Mother   . Stomach cancer Other   . Diabetes Maternal Aunt   . Diabetes Maternal Grandmother   . Heart disease Neg Hx   . Colon cancer Neg Hx   . Esophageal cancer Neg Hx   . Rectal cancer Neg Hx     Social History:  reports that he has never smoked. He has never used smokeless tobacco. He reports that he drinks alcohol. He reports that he does not use drugs.    Review of Systems   He is on treatment for sleep apnea  HYPERTENSION: This is well controlled with 2 drugs including Norvasc and now 20 mg lisinopril HCTZ was stopped with using SGLT 2 drugs and because of hyperuricemia    BP Readings from Last 3 Encounters:  05/29/17 128/76  02/12/17 140/84  10/09/16 126/78     GOUT: He has had Previous recurrent episodes of gout and various joints including knee and elbow periodically Baseline uric acid was 8.4 done in 2012  Currently is not having an recurrence of his gout  Occasionally has swelling in the left big toe Taking allopurinol 300 mg daily regularly with last uric acid 3.3      Hyperlipidemia: LDL has been controlled, baseline previously 109, has been tolerating simvastatin 20 mg. Has a low HDL  Lab Results  Component Value Date   CHOL 109 08/27/2016   HDL 34.60 (L) 08/27/2016   LDLCALC 61 08/27/2016   TRIG 68.0 08/27/2016   CHOLHDL 3 08/27/2016       HYPOGONADISM:  He has been on testosterone supplements since 2011 when his level was mildly low around 300  but no further evaluation done.   He has a relatively low level on using Axiron and had difficulty with the application With Androderm at therapeutic doses he had improved testosterone levels and less fatigue Also takes Viagra  for erectile dysfunction  With  taking 6 mg Androderm total dosage his level Was below 300 and now with 8 mg again it is 327  He thinks he is now feeling much less fatigued than before     Lab Results  Component Value Date   TESTOSTERONE 327.45 05/27/2017     LABS:  Lab on 05/27/2017  Component Date Value Ref Range Status  . Hgb A1c MFr Bld 05/27/2017 6.7* 4.6 - 6.5 % Final   Glycemic Control Guidelines for People with Diabetes:Non Diabetic:  <6%Goal of Therapy: <7%Additional Action Suggested:  >8%   . Sodium 05/27/2017 141  135 - 145 mEq/L Final  . Potassium 05/27/2017 4.3  3.5 - 5.1 mEq/L Final  . Chloride 05/27/2017 106  96 - 112 mEq/L Final  . CO2 05/27/2017 24  19 - 32 mEq/L Final  . Glucose, Bld 05/27/2017 103* 70 - 99 mg/dL Final  . BUN 05/27/2017 16  6 - 23 mg/dL Final  . Creatinine, Ser 05/27/2017 1.19  0.40 - 1.50 mg/dL Final  . Calcium 05/27/2017 9.6  8.4 - 10.5 mg/dL Final  . GFR 05/27/2017 82.14  >60.00 mL/min Final  . Testosterone 05/27/2017 327.45  300.00 - 890.00 ng/dL Final     Physical Examination:  BP 128/76   Pulse 95   Ht '5\' 11"'  (1.803 m)   Wt 279 lb (126.6 kg)   SpO2 95%   BMI 38.91 kg/m         Diabetic Foot Exam - Simple   Simple Foot Form Diabetic Foot exam was  performed with the following findings:  Yes   Visual Inspection No deformities, no ulcerations, no other skin breakdown bilaterally:  Yes Sensation Testing Intact to touch and monofilament testing bilaterally:  Yes Pulse Check Posterior Tibialis and Dorsalis pulse intact bilaterally:  Yes Comments Less Left 1st toe       ASSESSMENT/PLAN:   Diabetes type 2, with obesity, BMI 39 See history of present illness for detailed discussion of his current management, blood sugar patterns and problems identified  His blood sugars are well controlled with A1c consistently below 7 and now 6.7 He is doing well with healthy lifestyle and blood sugars are not unusually high at  home For convenience he can switch from Invokana to [place]ores Also when he finishes Victoza he can start OZEMPIC 0.5 mg weekly as discussed today  HYPOGONADISM: His symptoms are better controlled with 8 mg Androderm and the level is back up to normal We will continue the same and continue follow-up periodically  Episodes of ?  gout: He will continue allopurinol although not convinced that he was having acute attacks  HYPERTENSION:  His blood pressure is better controlled with 20 mg lisinopril   Influenza vaccine given  There are no Patient Instructions on file for this visit.    Eletha Culbertson 05/29/2017, 10:04 AM

## 2017-05-30 ENCOUNTER — Encounter: Payer: Self-pay | Admitting: Endocrinology

## 2017-05-31 NOTE — Telephone Encounter (Signed)
Caller Name: Treson Laura  Best  Number: [phone]     Pharmacy: [facility] [address]  Reason for call:  Pt received prescription for Invokomet XR but is concerned that it is prescribed 2 Tablet every day with Breakfast.  Please advise patient since this is a change for him.  [phone]

## 2017-06-04 ENCOUNTER — Other Ambulatory Visit: Payer: Self-pay | Admitting: Endocrinology

## 2017-06-12 ENCOUNTER — Other Ambulatory Visit: Payer: Self-pay

## 2017-06-12 MED ORDER — AMLODIPINE BESYLATE 5 MG PO TABS: 5.0000 mg | ORAL_TABLET | Freq: Every day | ORAL | 1 refills | Status: DC

## 2017-07-10 ENCOUNTER — Other Ambulatory Visit: Payer: Self-pay

## 2017-07-10 MED ORDER — LISINOPRIL 20 MG PO TABS: 20.0000 mg | ORAL_TABLET | Freq: Every day | ORAL | 4 refills | Status: DC

## 2017-07-25 ENCOUNTER — Other Ambulatory Visit: Payer: Self-pay | Admitting: Endocrinology

## 2017-07-31 ENCOUNTER — Encounter: Payer: Self-pay | Admitting: Endocrinology

## 2017-08-01 ENCOUNTER — Other Ambulatory Visit: Payer: Self-pay | Admitting: Endocrinology

## 2017-08-01 MED ORDER — GLUCOSE BLOOD VI STRP: ORAL_STRIP | 12 refills | Status: DC

## 2017-08-01 MED ORDER — EMPAGLIFLOZIN-METFORMIN HCL ER 12.5-1000 MG PO TB24: 2.0000 | ORAL_TABLET | Freq: Every day | ORAL | 2 refills | Status: DC

## 2017-08-02 ENCOUNTER — Encounter: Payer: Self-pay | Admitting: Internal Medicine

## 2017-08-06 ENCOUNTER — Other Ambulatory Visit: Payer: Self-pay | Admitting: Family Medicine

## 2017-08-08 ENCOUNTER — Encounter: Payer: Self-pay | Admitting: Internal Medicine

## 2017-08-24 ENCOUNTER — Other Ambulatory Visit: Payer: Self-pay | Admitting: Endocrinology

## 2017-09-01 ENCOUNTER — Other Ambulatory Visit: Payer: Self-pay | Admitting: Endocrinology

## 2017-09-05 ENCOUNTER — Encounter: Payer: Self-pay | Admitting: Endocrinology

## 2017-09-09 ENCOUNTER — Other Ambulatory Visit: Payer: Self-pay | Admitting: Endocrinology

## 2017-09-09 MED ORDER — SEMAGLUTIDE(0.25 OR 0.5MG/DOS) 2 MG/1.5ML ~~LOC~~ SOPN: 0.5000 mg | PEN_INJECTOR | SUBCUTANEOUS | 2 refills | Status: DC

## 2017-09-10 LAB — HM DIABETES EYE EXAM

## 2017-09-20 ENCOUNTER — Other Ambulatory Visit: Payer: Self-pay

## 2017-09-20 MED ORDER — EMPAGLIFLOZIN-METFORMIN HCL ER 12.5-1000 MG PO TB24: 2.0000 | ORAL_TABLET | Freq: Every day | ORAL | 2 refills | Status: DC

## 2017-09-24 ENCOUNTER — Other Ambulatory Visit (INDEPENDENT_AMBULATORY_CARE_PROVIDER_SITE_OTHER): Payer: BLUE CROSS/BLUE SHIELD

## 2017-09-24 DIAGNOSIS — E291 Testicular hypofunction: Secondary | ICD-10-CM | POA: Diagnosis not present

## 2017-09-24 DIAGNOSIS — E1165 Type 2 diabetes mellitus with hyperglycemia: Secondary | ICD-10-CM

## 2017-09-24 LAB — COMPREHENSIVE METABOLIC PANEL
ALK PHOS: 88 U/L (ref 39–117)
ALT: 22 U/L (ref 0–53)
AST: 19 U/L (ref 0–37)
Albumin: 4 g/dL (ref 3.5–5.2)
BUN: 15 mg/dL (ref 6–23)
CHLORIDE: 103 meq/L (ref 96–112)
CO2: 25 mEq/L (ref 19–32)
Calcium: 9.2 mg/dL (ref 8.4–10.5)
Creatinine, Ser: 1.29 mg/dL (ref 0.40–1.50)
GFR: 74.75 mL/min (ref >60.00)
GLUCOSE: 211 mg/dL — AB (ref 70–99)
POTASSIUM: 3.8 meq/L (ref 3.5–5.1)
Sodium: 135 mEq/L (ref 135–145)
TOTAL PROTEIN: 7.2 g/dL (ref 6.0–8.3)
Total Bilirubin: 0.3 mg/dL (ref 0.2–1.2)

## 2017-09-24 LAB — LIPID PANEL
Cholesterol: 104 mg/dL (ref 0–200)
HDL: 32.2 mg/dL — AB (ref >39.00)
LDL CALC: 47 mg/dL (ref 0–99)
NONHDL: 72.05
Total CHOL/HDL Ratio: 3
Triglycerides: 127 mg/dL (ref 0.0–149.0)
VLDL: 25.4 mg/dL (ref 0.0–40.0)

## 2017-09-24 LAB — HEMOGLOBIN A1C: HEMOGLOBIN A1C: 7.2 % — AB (ref 4.6–6.5)

## 2017-09-24 LAB — TESTOSTERONE: TESTOSTERONE: 560.22 ng/dL (ref 300.00–890.00)

## 2017-09-26 ENCOUNTER — Ambulatory Visit: Payer: BLUE CROSS/BLUE SHIELD | Admitting: Endocrinology

## 2017-09-26 ENCOUNTER — Other Ambulatory Visit: Payer: Self-pay

## 2017-09-26 ENCOUNTER — Encounter: Payer: Self-pay | Admitting: Endocrinology

## 2017-09-26 DIAGNOSIS — E1165 Type 2 diabetes mellitus with hyperglycemia: Secondary | ICD-10-CM | POA: Diagnosis not present

## 2017-09-26 LAB — MICROALBUMIN / CREATININE URINE RATIO
Creatinine,U: 153.4 mg/dL
MICROALB/CREAT RATIO: 0.5 mg/g (ref 0.0–30.0)

## 2017-09-26 MED ORDER — ACCU-CHEK FASTCLIX LANCETS MISC: 3 refills | Status: DC

## 2017-09-26 NOTE — Progress Notes (Signed)
Patient ID: TOAN MORT, male   DOB: 03-30-1966, 52 y.o.   MRN: [id number]   Reason for Appointment : Followup for Type 2 Diabetes  History of Present Illness          Diagnosis: Type 2 diabetes mellitus, date of diagnosis: 2005     Background information: He was probably started on metformin at the time of diagnosis when his blood sugars were not very high About 5-6 years ago because of higher blood sugars he was also given glipizide  He appeared to have  progression of his diabetes with A1c going up to 8.1% in 01/2013; also had difficulty with losing weight before he was started on Victoza.  With this his blood sugars were significantly better His glipizide was reduced to 2.5 mg in 10/14 because of rare hypoglycemia He has taken 1.8 mg of Victoza since 7/15   RECENT history:   Antihyperglycemic drugs : Ozempic 0.5 mg weekly, and Amaryl 1 mg am, Synjardy XR 1.11/998, 2 tablets daily  His A1c had been under 7% consistently but now higher at 7.2  Current management, blood sugar patterns and problems:  He has taken Ozempic instead of Victoza since his last visit but has only started this about a month ago  Tolerating 0.5 mg weekly  However even though recent blood sugars are relatively better his A1c has gone up  His insurance did not cover Invokana and he is now switching to Synjardy  May not have been consistent with his diet over the last 3 months but recently trying to do better, trying to eat more salads now  He is however checking his blood sugars mostly after his lunch at work and sometimes when waking up  Postprandial readings averaging about 160 after lunch but not clear if he is having higher readings after evening meal  Is also taking Invokana consistently, now taking 300 mg, he does take this with food for tolerability  Does not appear to have gained any weight this last visit  He is still trying to exercise fairly regularly either at the gym or  walking   Side effects from medications have been: None       Monitors blood glucose: once a day          Glucometer:      Accu-Chek  Blood Glucose readings by download:  Mean values apply above for all meters except median for One Touch  PRE-MEAL Fasting Lunch Dinner Bedtime Overall  Glucose range: 98-1 38       Mean/median:     148   POST-MEAL PC Breakfast PC Lunch PC Dinner  Glucose range:  130-190  189   Mean/median:  160     LIFESTYLE: Meals: 3 meals per day. Dinner 5 pm, Bfst 6-7 am; lunch 12 am;  eating out periodically  His breakfast:  eggs and toast or meat  Usually eating fruit and sandwich at lunch, usually a grilled meat at suppertime   Physical activity: exercise: 4/7 days a week, at gym or walking, 45 min      Dietician visit: 05/2013           Wt Readings from Last 3 Encounters:  09/26/17 278 lb 3.2 oz (126.2 kg)  05/29/17 279 lb (126.6 kg)  02/12/17 278 lb 3.2 oz (126.2 kg)   Lab Results  Component Value Date   HGBA1C 7.2 (H) 09/24/2017   HGBA1C 6.7 (H) 05/27/2017   HGBA1C 6.4 02/08/2017   Lab Results  Component Value Date   MICROALBUR <0.7 08/27/2016   LDLCALC 47 09/24/2017   CREATININE 1.29 09/24/2017    Other active problems including new problems are in review of systems    Lab on 09/24/2017  Component Date Value Ref Range Status  . Testosterone 09/24/2017 560.22  300.00 - 890.00 ng/dL Final  . Cholesterol 09/24/2017 104  0 - 200 mg/dL Final   ATP III Classification       Desirable:  < 200 mg/dL               Borderline High:  200 - 239 mg/dL          High:  > = 240 mg/dL  . Triglycerides 09/24/2017 127.0  0.0 - 149.0 mg/dL Final   Normal:  <150 mg/dLBorderline High:  150 - 199 mg/dL  . HDL 09/24/2017 32.20* >39.00 mg/dL Final  . VLDL 09/24/2017 25.4  0.0 - 40.0 mg/dL Final  . LDL Cholesterol 09/24/2017 47  0 - 99 mg/dL Final  . Total CHOL/HDL Ratio 09/24/2017 3   Final                  Men          Women1/2 Average Risk     3.4           3.3Average Risk          5.0          4.42X Average Risk          9.6          7.13X Average Risk          15.0          11.0                      . NonHDL 09/24/2017 72.05   Final   NOTE:  Non-HDL goal should be 30 mg/dL higher than patient's LDL goal (i.e. LDL goal of < 70 mg/dL, would have non-HDL goal of < 100 mg/dL)  . Sodium 09/24/2017 135  135 - 145 mEq/L Final  . Potassium 09/24/2017 3.8  3.5 - 5.1 mEq/L Final  . Chloride 09/24/2017 103  96 - 112 mEq/L Final  . CO2 09/24/2017 25  19 - 32 mEq/L Final  . Glucose, Bld 09/24/2017 211* 70 - 99 mg/dL Final  . BUN 09/24/2017 15  6 - 23 mg/dL Final  . Creatinine, Ser 09/24/2017 1.29  0.40 - 1.50 mg/dL Final  . Total Bilirubin 09/24/2017 0.3  0.2 - 1.2 mg/dL Final  . Alkaline Phosphatase 09/24/2017 88  39 - 117 U/L Final  . AST 09/24/2017 19  0 - 37 U/L Final  . ALT 09/24/2017 22  0 - 53 U/L Final  . Total Protein 09/24/2017 7.2  6.0 - 8.3 g/dL Final  . Albumin 09/24/2017 4.0  3.5 - 5.2 g/dL Final  . Calcium 09/24/2017 9.2  8.4 - 10.5 mg/dL Final  . GFR 09/24/2017 74.75  >60.00 mL/min Final  . Hgb A1c MFr Bld 09/24/2017 7.2* 4.6 - 6.5 % Final   Glycemic Control Guidelines for People with Diabetes:Non Diabetic:  <6%Goal of Therapy: <7%Additional Action Suggested:  >8%     Allergies as of 09/26/2017      Reactions   Celebrex [celecoxib] Itching   REACTION: itching   Penicillins Itching   Sulfonamide Derivatives Itching   REACTION: itching      Medication List  Accurate as of 09/26/17  4:04 PM. Always use your most recent med list.          allopurinol 300 MG tablet Commonly known as:  ZYLOPRIM Take 1 tablet (300 mg total) by mouth daily.   amLODipine 5 MG tablet Commonly known as:  NORVASC Take 1 tablet (5 mg total) daily by mouth.   ANDRODERM 2 MG/24HR Pt24 Generic drug:  Testosterone APPLY 1 PATCH EVERY DAY   testosterone 4 MG/24HR Pt24 patch Commonly known as:  ANDRODERM USE 8 MG EVERY DAY   aspirin 81 MG  tablet Take 81 mg by mouth daily.   BD PEN NEEDLE NANO U/F 32G X 4 MM Misc Generic drug:  Insulin Pen Needle USE EVERY DAY   canagliflozin 300 MG Tabs tablet Commonly known as:  INVOKANA Take 1 tablet (300 mg total) by mouth daily before breakfast.   Empagliflozin-metFORMIN HCl ER 12.11-998 MG Tb24 Commonly known as:  SYNJARDY XR Take 2 tablets by mouth daily with breakfast.   esomeprazole 40 MG capsule Commonly known as:  NEXIUM Take 1 capsule (40 mg total) by mouth daily at 12 noon.   fluticasone 50 MCG/ACT nasal spray Commonly known as:  FLONASE PLACE 2 SPRAYS INTO THE NOSE DAILY.   glimepiride 1 MG tablet Commonly known as:  AMARYL TAKE 1 TABLET (1 MG TOTAL) BY MOUTH DAILY WITH BREAKFAST.   glucose blood test strip Commonly known as:  ACCU-CHEK GUIDE Use as instructed   ONE TOUCH ULTRA TEST test strip Generic drug:  glucose blood USE AS INSTRUCTED TO CHECK BLOOD SUGAR TWO TIMES A DAY DX CODE E11.9   liraglutide 18 MG/3ML Sopn Commonly known as:  VICTOZA INJECT 1.8 MG UNDER THE SKIN DAILY.   lisinopril 20 MG tablet Commonly known as:  PRINIVIL,ZESTRIL Take 1 tablet (20 mg total) by mouth daily.   ONETOUCH DELICA LANCETS FINE Misc Use to check blood sugar 2 times per day, dx code E11.9   ONETOUCH VERIO IQ SYSTEM w/Device Kit Use to check blood sugar 2 times daily   prednisoLONE acetate 1 % ophthalmic suspension Commonly known as:  PRED FORTE   Semaglutide 0.25 or 0.5 MG/DOSE Sopn Commonly known as:  OZEMPIC Inject 0.5 mg into the skin once a week. Take 0.25 mg weekly for the first 2 injections and then 0.5 mg weekly   simvastatin 20 MG tablet Commonly known as:  ZOCOR TAKE 1 TABLET (20 MG TOTAL) BY MOUTH AT BEDTIME.   VIAGRA 100 MG tablet Generic drug:  sildenafil       Allergies:  Allergies  Allergen Reactions  . Celebrex [Celecoxib] Itching    REACTION: itching  . Penicillins Itching  . Sulfonamide Derivatives Itching    REACTION: itching      Past Medical History:  Diagnosis Date  . Diabetes mellitus   . 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 Gout   . Hyperlipidemia   . Hypertension     Past Surgical History:  Procedure Laterality Date  . KNEE SURGERY Left 2005    Family History  Problem Relation Age of Onset  . Diabetes Mother   . Stomach cancer Other   . Diabetes Maternal Aunt   . Diabetes Maternal Grandmother   . Heart disease Neg Hx   . Colon cancer Neg Hx   . Esophageal cancer Neg Hx   . Rectal cancer Neg Hx     Social History:  reports that  has never smoked. he has never used smokeless tobacco. He  reports that he drinks alcohol. He reports that he does not use drugs.    Review of Systems   He is on treatment for sleep apnea  HYPERTENSION: This is well controlled with 2 drugs including Norvasc and  20 mg lisinopril HCTZ was stopped with using SGLT 2 drugs and because of hyperuricemia   BP Readings from Last 3 Encounters:  09/26/17 134/80  05/29/17 128/76  02/12/17 140/84     GOUT: He has had Previous recurrent episodes of gout and various joints including knee and elbow periodically Baseline uric acid was 8.4 done in 2012   Taking allopurinol 300 mg daily regularly with last uric acid 3.3  He is asking about some diffuse pain in his finger joints lately, minimal swelling     Hyperlipidemia: LDL has been controlled, baseline previously 109, has been taking simvastatin 20 mg. Has a low HDL  Lab Results  Component Value Date   CHOL 104 09/24/2017   HDL 32.20 (L) 09/24/2017   LDLCALC 47 09/24/2017   TRIG 127.0 09/24/2017   CHOLHDL 3 09/24/2017       HYPOGONADISM:  He has been on testosterone supplements since 2011 when his level was mildly low around 300  but no further evaluation done.   He has a relatively low level on using Axiron and had difficulty with the application With Androderm at therapeutic doses he had improved testosterone levels and less fatigue With  taking 6 mg  Androderm total dosage his level Was below 300  Also takes Viagra for erectile dysfunction  His testosterone level now with using 8 mg Androderm is improved at 560 Not complaining of fatigue   Lab Results  Component Value Date   TESTOSTERONE 560.22 09/24/2017     LABS:  Lab on 09/24/2017  Component Date Value Ref Range Status  . Testosterone 09/24/2017 560.22  300.00 - 890.00 ng/dL Final  . Cholesterol 09/24/2017 104  0 - 200 mg/dL Final   ATP III Classification       Desirable:  < 200 mg/dL               Borderline High:  200 - 239 mg/dL          High:  > = 240 mg/dL  . Triglycerides 09/24/2017 127.0  0.0 - 149.0 mg/dL Final   Normal:  <150 mg/dLBorderline High:  150 - 199 mg/dL  . HDL 09/24/2017 32.20* >39.00 mg/dL Final  . VLDL 09/24/2017 25.4  0.0 - 40.0 mg/dL Final  . LDL Cholesterol 09/24/2017 47  0 - 99 mg/dL Final  . Total CHOL/HDL Ratio 09/24/2017 3   Final                  Men          Women1/2 Average Risk     3.4          3.3Average Risk          5.0          4.42X Average Risk          9.6          7.13X Average Risk          15.0          11.0                      . NonHDL 09/24/2017 72.05   Final   NOTE:  Non-HDL goal should be 30 mg/dL higher  than patient's LDL goal (i.e. LDL goal of < 70 mg/dL, would have non-HDL goal of < 100 mg/dL)  . Sodium 09/24/2017 135  135 - 145 mEq/L Final  . Potassium 09/24/2017 3.8  3.5 - 5.1 mEq/L Final  . Chloride 09/24/2017 103  96 - 112 mEq/L Final  . CO2 09/24/2017 25  19 - 32 mEq/L Final  . Glucose, Bld 09/24/2017 211* 70 - 99 mg/dL Final  . BUN 09/24/2017 15  6 - 23 mg/dL Final  . Creatinine, Ser 09/24/2017 1.29  0.40 - 1.50 mg/dL Final  . Total Bilirubin 09/24/2017 0.3  0.2 - 1.2 mg/dL Final  . Alkaline Phosphatase 09/24/2017 88  39 - 117 U/L Final  . AST 09/24/2017 19  0 - 37 U/L Final  . ALT 09/24/2017 22  0 - 53 U/L Final  . Total Protein 09/24/2017 7.2  6.0 - 8.3 g/dL Final  . Albumin 09/24/2017 4.0  3.5 - 5.2 g/dL  Final  . Calcium 09/24/2017 9.2  8.4 - 10.5 mg/dL Final  . GFR 09/24/2017 74.75  >60.00 mL/min Final  . Hgb A1c MFr Bld 09/24/2017 7.2* 4.6 - 6.5 % Final   Glycemic Control Guidelines for People with Diabetes:Non Diabetic:  <6%Goal of Therapy: <7%Additional Action Suggested:  >8%      Physical Examination:  BP 134/80   Pulse 97   Ht '5\' 11"'  (1.803 m)   Wt 278 lb 3.2 oz (126.2 kg)   SpO2 94%   BMI 38.80 kg/m          ASSESSMENT/PLAN:   Diabetes type 2, with obesity, BMI 39  See history of present illness for detailed discussion of his current management, blood sugar patterns and problems identified   not clear why his A1c has gone up even though his blood sugars looked fairly good at home However he does not check after evening meal His blood sugars are only occasionally higher after his lunch at work, related to amount of carbohydrate or other foods Weight has been stable  Most likely his blood sugar control should improve with staying on 0.5 Ozempic However consider increasing the dose if A1c is still high on the next visit Continue Synjardy   HYPOGONADISM: His symptoms are better controlled with 8 mg Androderm and the level is very normal now  We will continue the same and continue follow-up periodically   HYPERTENSION:  His blood pressure is controlled with 20 mg lisinopril   joint pain: Unlikely to be from download as he has more diffuse arthralgia, he can continue allopurinol since he has less gout episodes and uric acid was below normal   There are no Patient Instructions on file for this visit.    Elayne Snare 09/26/2017, 4:04 PM

## 2017-09-26 NOTE — Patient Instructions (Addendum)
Check blood sugars on waking up  2/7  Also check blood sugars about 2 hours after a meal and do this after different meals by rotation  Recommended blood sugar levels on waking up is 90-130 and about 2 hours after meal is 130-160  Please bring your blood sugar monitor to each visit, thank you  If sugar still >160-180 call for 1mg  Ozempic  Reduce carbs

## 2017-09-27 ENCOUNTER — Encounter: Payer: Self-pay | Admitting: Family Medicine

## 2017-10-01 ENCOUNTER — Ambulatory Visit (INDEPENDENT_AMBULATORY_CARE_PROVIDER_SITE_OTHER): Payer: BLUE CROSS/BLUE SHIELD | Admitting: Family Medicine

## 2017-10-01 ENCOUNTER — Encounter: Payer: Self-pay | Admitting: Family Medicine

## 2017-10-01 VITALS — BP 116/88 | Temp 97.7°F | Ht 71.0 in | Wt 275.0 lb

## 2017-10-01 DIAGNOSIS — M109 Gout, unspecified: Secondary | ICD-10-CM | POA: Diagnosis not present

## 2017-10-01 DIAGNOSIS — I10 Essential (primary) hypertension: Secondary | ICD-10-CM

## 2017-10-01 DIAGNOSIS — E785 Hyperlipidemia, unspecified: Secondary | ICD-10-CM

## 2017-10-01 DIAGNOSIS — Z0000 Encounter for general adult medical examination without abnormal findings: Secondary | ICD-10-CM | POA: Insufficient documentation

## 2017-10-01 LAB — TSH: TSH: 2.99 u[IU]/mL (ref 0.35–4.50)

## 2017-10-01 LAB — CBC WITH DIFFERENTIAL/PLATELET
BASOS ABS: 0 10*3/uL (ref 0.0–0.1)
Basophils Relative: 0.6 % (ref 0.0–3.0)
Eosinophils Absolute: 0.2 10*3/uL (ref 0.0–0.7)
Eosinophils Relative: 2.5 % (ref 0.0–5.0)
HCT: 45.6 % (ref 39.0–52.0)
HEMOGLOBIN: 15.3 g/dL (ref 13.0–17.0)
LYMPHS ABS: 2.2 10*3/uL (ref 0.7–4.0)
Lymphocytes Relative: 35.7 % (ref 12.0–46.0)
MCHC: 33.6 g/dL (ref 30.0–36.0)
MCV: 75.7 fl — ABNORMAL LOW (ref 78.0–100.0)
MONO ABS: 0.6 10*3/uL (ref 0.1–1.0)
Monocytes Relative: 9.4 % (ref 3.0–12.0)
NEUTROS PCT: 51.8 % (ref 43.0–77.0)
Neutro Abs: 3.2 10*3/uL (ref 1.4–7.7)
Platelets: 245 10*3/uL (ref 150.0–400.0)
RBC: 6.02 Mil/uL — AB (ref 4.22–5.81)
RDW: 15.3 % (ref 11.5–15.5)
WBC: 6.2 10*3/uL (ref 4.0–10.5)

## 2017-10-01 LAB — PSA: PSA: 0.24 ng/mL (ref 0.10–4.00)

## 2017-10-01 MED ORDER — FLUTICASONE PROPIONATE 50 MCG/ACT NA SUSP: NASAL | 6 refills | Status: DC

## 2017-10-01 MED ORDER — AMLODIPINE BESYLATE 5 MG PO TABS: 5.0000 mg | ORAL_TABLET | Freq: Every day | ORAL | 4 refills | Status: DC

## 2017-10-01 MED ORDER — OMEPRAZOLE 40 MG PO CPDR: 40.0000 mg | DELAYED_RELEASE_CAPSULE | Freq: Every day | ORAL | 3 refills | Status: DC

## 2017-10-01 MED ORDER — ALLOPURINOL 300 MG PO TABS: 300.0000 mg | ORAL_TABLET | Freq: Every day | ORAL | 4 refills | Status: DC

## 2017-10-01 NOTE — Patient Instructions (Signed)
Labs today...Marland KitchenMarland KitchenMarland Kitchen I will call you if there is anything abnormal  Continue diet exercise program  Continue your current medications  If you wish to take something for your joint pain I would recommend Motrin 400 twice daily with food........... if instead of intermittently 1 to take this on a regular basis and after taking this medication twice daily for 4 weeks we will need to get a follow-up renal function studies.  Return in one year for general physical exam sooner if any problems  Soak her feet in file those toenails weekly.

## 2017-10-01 NOTE — Progress Notes (Signed)
Clifford Kramer is a 52 year old married male nonsmoker......... truck driver for [employer]...... who comes in today for annual physical examination because of history of a metabolic syndrome  His diabetes is well managed by Dr. Dwyane Dee. His last A1c was 7.2% in February 2019.  He takes allopurinol 3 or milligrams daily because of a history of gout. He's developing some stiffness and soreness in his joints. We talked about various options. We recommend low-dose Tylenol. If however he would like to try an NSAID explained to him the side effects including decreasing renal function.  For hypertension he takes Norvasc 5 mg daily along with lisinopril 20 mg daily. BP today 116/88  He uses Zocor along with an aspirin because of a history of hyperlipidemia  He uses Viagra via his urologist. He has no history of T and gets the androgen gel patches from his urologist.  His weight is going down. He's lost 3 pounds since last month. He's on a diet and exercise program.  He's got trouble with his right and left great toes. He says the toenails are thickened.  He gets routine eye care, dental care, colonoscopy 15..........Marland Kitchen repeat the spring because he had 3 polyps  14 point review of systems June otherwise negative  EKG was done because of a history of above. EKG was normal and unchanged  Vaccinations up-to-date  BP 116/88 (BP Location: Left Arm, Patient Position: Sitting, Cuff Size: Large)   Temp 97.7 F (36.5 C) (Oral)   Ht 5\' 11"  (1.803 m)   Wt 275 lb (124.7 kg)   BMI 38.35 kg/m  Well-developed well-nourished overweight male no acute distress vital signs stable he is afebrile HEENT were negative neck was supple no adenopathy thyroid normal no carotid bruits. Cardiopulmonary exam normal abdominal exam normal genitalia normal circumcised male rectum normal stool guaiac negative prostate smooth nonnodular BPH 1+  Extremities normal skin normal peripheral pulses normal except for fungal infection of both great  toenails  #1 diabetes approaching goal......... continue current therapy follow-up with Dr. Dwyane Dee every 3 months  #2 hypertension at goal........... continue current therapy  #3 hyperlipidemia....... continue Zocor check labs  #4 history of gout..... Continue allopurinol.... Low-dose Motrin 400 twice a day with food if he's going to take this on a regular basis recommend renal function studies after he's been on for 4 weeks  #5 allergic rhinitis.......Marland Kitchen plain Claritin at bedtime along with one shot of steroid nasal spray up each nostril  #6 low T.... Followed by urology.Marland Kitchen

## 2017-10-07 ENCOUNTER — Other Ambulatory Visit: Payer: Self-pay

## 2017-10-07 ENCOUNTER — Encounter: Payer: Self-pay | Admitting: Endocrinology

## 2017-10-07 ENCOUNTER — Ambulatory Visit (AMBULATORY_SURGERY_CENTER): Payer: Self-pay | Admitting: *Deleted

## 2017-10-07 VITALS — Ht 71.0 in | Wt 282.0 lb

## 2017-10-07 DIAGNOSIS — Z86010 Personal history of colonic polyps: Secondary | ICD-10-CM

## 2017-10-07 MED ORDER — NA SULFATE-K SULFATE-MG SULF 17.5-3.13-1.6 GM/177ML PO SOLN: ORAL | 0 refills | Status: DC

## 2017-10-07 NOTE — Progress Notes (Signed)
Patient denies any allergies to eggs or soy. Patient denies any problems with anesthesia/sedation. Patient denies any oxygen use at home. Patient denies taking any diet/weight loss medications or blood thinners. EMMI education assisgned to patient on colonoscopy, this was explained and instructions given to patient. 

## 2017-10-08 ENCOUNTER — Encounter: Payer: Self-pay | Admitting: Internal Medicine

## 2017-10-21 ENCOUNTER — Ambulatory Visit (AMBULATORY_SURGERY_CENTER): Payer: BLUE CROSS/BLUE SHIELD | Admitting: Internal Medicine

## 2017-10-21 ENCOUNTER — Encounter: Payer: Self-pay | Admitting: Internal Medicine

## 2017-10-21 VITALS — BP 131/85 | HR 65 | Temp 99.1°F | Resp 15 | Ht 71.0 in | Wt 282.0 lb

## 2017-10-21 DIAGNOSIS — Z86010 Personal history of colonic polyps: Secondary | ICD-10-CM

## 2017-10-21 MED ORDER — SODIUM CHLORIDE 0.9 % IV SOLN: 500.0000 mL | Freq: Once | INTRAVENOUS | Status: DC

## 2017-10-21 NOTE — Op Note (Signed)
[name] Procedure Date: 10/21/2017 8:28 AM MRN: [id number] Endoscopist: Jerene Bears , MD Age: 52 Referring MD:  Date of Birth: 31-Dec-1965 Gender: Male Account #: [id number] Procedure:                Colonoscopy Indications:              Surveillance: Personal history of adenomatous                            polyps on last colonoscopy 3 years ago Medicines:                Monitored Anesthesia Care Procedure:                Pre-Anesthesia Assessment:                           - Prior to the procedure, a History and Physical                            was performed, and patient medications and                            allergies were reviewed. The patient's tolerance of                            previous anesthesia was also reviewed. The risks                            and benefits of the procedure and the sedation                            options and risks were discussed with the patient.                            All questions were answered, and informed consent                            was obtained. Prior Anticoagulants: The patient has                            taken no previous anticoagulant or antiplatelet                            agents. ASA Grade Assessment: III - A patient with                            severe systemic disease. After reviewing the risks                            and benefits, the patient was deemed in                            satisfactory condition to undergo the procedure.  After obtaining informed consent, the colonoscope                            was passed under direct vision. Throughout the                            procedure, the patient's blood pressure, pulse, and                            oxygen saturations were monitored continuously. The                            Model CF-HQ190L [id number]) scope was introduced                            through the anus and  advanced to the the cecum,                            identified by appendiceal orifice and ileocecal                            valve. The colonoscopy was performed without                            difficulty. The patient tolerated the procedure                            well. The quality of the bowel preparation was                            good. The ileocecal valve, appendiceal orifice, and                            rectum were photographed. Scope In: 8:39:57 AM Scope Out: 8:52:22 AM Scope Withdrawal Time: 0 hours 10 minutes 31 seconds  Total Procedure Duration: 0 hours 12 minutes 25 seconds  Findings:                 The digital rectal exam was normal.                           A few small-mouthed diverticula were found in the                            sigmoid colon.                           The exam was otherwise without abnormality on                            direct and retroflexion views. Complications:            No immediate complications. Estimated Blood Loss:     Estimated blood loss: none. Impression:               - Mild diverticulosis in the sigmoid  colon.                           - The examination was otherwise normal on direct                            and retroflexion views.                           - No specimens collected. Recommendation:           - Patient has a contact number available for                            emergencies. The signs and symptoms of potential                            delayed complications were discussed with the                            patient. Return to normal activities tomorrow.                            Written discharge instructions were provided to the                            patient.                           - Resume previous diet.                           - Continue present medications.                           - Repeat colonoscopy in 5 years for surveillance. Jerene Bears, MD 10/21/2017 8:54:42 AM This  report has been signed electronically.

## 2017-10-21 NOTE — Patient Instructions (Signed)
Information on diverticulosis given  YOU HAD AN ENDOSCOPIC PROCEDURE TODAY AT [facility]:   Refer to the procedure report that was given to you for any specific questions about what was found during the examination.  If the procedure report does not answer your questions, please call your gastroenterologist to clarify.  If you requested that your care partner not be given the details of your procedure findings, then the procedure report has been included in a sealed envelope for you to review at your convenience later.  YOU SHOULD EXPECT: Some feelings of bloating in the abdomen. Passage of more gas than usual.  Walking can help get rid of the air that was put into your GI tract during the procedure and reduce the bloating. If you had a lower endoscopy (such as a colonoscopy or flexible sigmoidoscopy) you may notice spotting of blood in your stool or on the toilet paper. If you underwent a bowel prep for your procedure, you may not have a normal bowel movement for a few days.  Please Note:  You might notice some irritation and congestion in your nose or some drainage.  This is from the oxygen used during your procedure.  There is no need for concern and it should clear up in a day or so.  SYMPTOMS TO REPORT IMMEDIATELY:   Following lower endoscopy (colonoscopy or flexible sigmoidoscopy):  Excessive amounts of blood in the stool  Significant tenderness or worsening of abdominal pains  Swelling of the abdomen that is new, acute  Fever of 100F or higher  For urgent or emergent issues, a gastroenterologist can be reached at any hour by calling [phone].   DIET:  We do recommend a small meal at first, but then you may proceed to your regular diet.  Drink plenty of fluids but you should avoid alcoholic beverages for 24 hours.  ACTIVITY:  You should plan to take it easy for the rest of today and you should NOT DRIVE or use heavy machinery until tomorrow (because of the  sedation medicines used during the test).    FOLLOW UP: Our staff will call the number listed on your records the next business day following your procedure to check on you and address any questions or concerns that you may have regarding the information given to you following your procedure. If we do not reach you, we will leave a message.  However, if you are feeling well and you are not experiencing any problems, there is no need to return our call.  We will assume that you have returned to your regular daily activities without incident.  If any biopsies were taken you will be contacted by phone or by letter within the next 1-3 weeks.  Please call us at [phone] if you have not heard about the biopsies in 3 weeks.    SIGNATURES/CONFIDENTIALITY: You and/or your care partner have signed paperwork which will be entered into your electronic medical record.  These signatures attest to the fact that that the information above on your After Visit Summary has been reviewed and is understood.  Full responsibility of the confidentiality of this discharge information lies with you and/or your care-partner.

## 2017-10-21 NOTE — Progress Notes (Signed)
A and O x3. Report to RN. Tolerated MAC anesthesia well.

## 2017-10-21 NOTE — Progress Notes (Signed)
Pt's states no medical or surgical changes since previsit or office visit. 

## 2017-10-22 ENCOUNTER — Telehealth: Payer: Self-pay

## 2017-10-22 NOTE — Telephone Encounter (Signed)
  Follow up Call-  Call back number 10/21/2017  Post procedure Call Back phone  # [phone]  Permission to leave phone message Yes  Some recent data might be hidden     Patient questions:  Do you have a fever, pain , or abdominal swelling? No. Pain Score  0 *  Have you tolerated food without any problems? Yes.    Have you been able to return to your normal activities? Yes.    Do you have any questions about your discharge instructions: Diet   No. Medications  No. Follow up visit  No.  Do you have questions or concerns about your Care? No.  Actions: * If pain score is 4 or above: No action needed, pain <4.

## 2017-11-01 ENCOUNTER — Other Ambulatory Visit: Payer: Self-pay | Admitting: Endocrinology

## 2017-11-05 ENCOUNTER — Other Ambulatory Visit: Payer: Self-pay | Admitting: *Deleted

## 2017-11-05 MED ORDER — SEMAGLUTIDE(0.25 OR 0.5MG/DOS) 2 MG/1.5ML ~~LOC~~ SOPN: 0.5000 mg | PEN_INJECTOR | SUBCUTANEOUS | 1 refills | Status: DC

## 2017-11-11 ENCOUNTER — Other Ambulatory Visit: Payer: Self-pay

## 2017-11-11 ENCOUNTER — Emergency Department
Admission: EM | Admit: 2017-11-11 | Discharge: 2017-11-11 | Disposition: A | Discharge status: Home / Self Care | Payer: BLUE CROSS/BLUE SHIELD | Source: Home / Self Care | Attending: Family Medicine | Admitting: Family Medicine

## 2017-11-11 DIAGNOSIS — H1132 Conjunctival hemorrhage, left eye: Secondary | ICD-10-CM | POA: Diagnosis not present

## 2017-11-11 NOTE — Discharge Instructions (Addendum)
May use refrigerated lubricating eye drops several times daily.

## 2017-11-11 NOTE — ED Provider Notes (Signed)
[facility]    CSN: [id number] Arrival date & time: 11/11/17  1503     History   Chief Complaint Chief Complaint  Patient presents with  . Eye Problem    Left eye redness    HPI Clifford Kramer is a 52 y.o. male.   Patient awoke two days ago with redness on the medial aspect of his left conjunctiva.  He denies pain, foreign body sensation, drainage, swelling in eyelids, or change in vision.  He recalls no injury to his eye.  He states that the redness has improved slightly.  The history is provided by the patient.  Eye Problem  Location:  Left eye Quality: No pain. Severity:  Mild Onset quality:  Sudden Duration:  2 days Timing:  Constant Progression:  Improving Chronicity:  New Context: not burn, not chemical exposure, not contact lens problem, not direct trauma, not foreign body, not using machinery, not scratch, not smoke exposure and not UV exposure   Relieved by:  None tried Worsened by:  Nothing Ineffective treatments:  None tried Associated symptoms: redness   Associated symptoms: no blurred vision, no crusting, no decreased vision, no discharge, no double vision, no facial rash, no headaches, no inflammation, no itching, no photophobia, no scotomas, no swelling and no tearing     Past Medical History:  Diagnosis Date  . Diabetes mellitus   . 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 Gout   . Hyperlipidemia   . Hypertension   . Sleep apnea    CPAP use     Patient Active Problem List   Diagnosis Date Noted  . Routine general medical examination at a health care facility 10/01/2017  . 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 Type II diabetes mellitus, uncontrolled ([place]) 03/04/2013  . Dyslipidemia (high LDL; low HDL) 03/04/2013  . Gout 08/17/2010  . Obstructive sleep apnea 12/07/2009  . 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 ERECTILE DYSFUNCTION 03/07/2007  . Essential hypertension 03/07/2007  . ALLERGIC RHINITIS 03/07/2007    Past Surgical  History:  Procedure Laterality Date  . COLONOSCOPY  2015  . KNEE SURGERY Left 2005       Home Medications    Prior to Admission medications   Medication Sig Start Date End Date Taking? Authorizing Provider  ACCU-CHEK FASTCLIX LANCETS MISC Use to check blood sugar 3 times daily. Dx Code E11.9 09/26/17   Elayne Snare, MD  allopurinol (ZYLOPRIM) 300 MG tablet Take 1 tablet (300 mg total) by mouth daily. 10/01/17   Dorena Cookey, MD  amLODipine (NORVASC) 5 MG tablet Take 1 tablet (5 mg total) by mouth daily. 10/01/17   Dorena Cookey, MD  ANDRODERM 4 MG/24HR PT24 patch PLACE 2 PATCHES ONTO THE SKIN DAILY 11/03/17   Elayne Snare, MD  aspirin 81 MG tablet Take 81 mg by mouth daily.      [provider]  BD PEN NEEDLE NANO U/F 32G X 4 MM MISC USE EVERY DAY 07/26/17   Elayne Snare, MD  Blood Glucose Monitoring Suppl (ONETOUCH VERIO IQ SYSTEM) W/DEVICE KIT Use to check blood sugar 2 times daily 05/18/14   Elayne Snare, MD  Empagliflozin-metFORMIN HCl ER (SYNJARDY XR) 12.11-998 MG TB24 Take 2 tablets by mouth daily with breakfast. 09/20/17   Elayne Snare, MD  fluticasone (FLONASE) 50 MCG/ACT nasal spray PLACE 2 SPRAYS INTO THE NOSE DAILY. 10/01/17   Dorena Cookey, MD  glimepiride (AMARYL) 1 MG tablet TAKE 1 TABLET (1 MG TOTAL) BY MOUTH DAILY  WITH BREAKFAST. 08/26/17   Elayne Snare, MD  glucose blood (ACCU-CHEK GUIDE) test strip Use as instructed 08/01/17   Elayne Snare, MD  lisinopril (PRINIVIL,ZESTRIL) 20 MG tablet Take 1 tablet (20 mg total) by mouth daily. 07/10/17   Elayne Snare, MD  omeprazole (PRILOSEC) 40 MG capsule Take 1 capsule (40 mg total) by mouth daily. 10/01/17   Dorena Cookey, MD  ONE TOUCH ULTRA TEST test strip USE AS INSTRUCTED TO CHECK BLOOD SUGAR TWO TIMES A DAY DX CODE E11.9 09/02/17   Elayne Snare, MD  [facility] DELICA LANCETS FINE MISC Use to check blood sugar 2 times per day, dx code E11.9 05/27/15   Elayne Snare, MD  Semaglutide (OZEMPIC) 0.25 or 0.5 MG/DOSE SOPN Inject 0.5 mg into the  skin once a week. 11/05/17   Elayne Snare, MD  simvastatin (ZOCOR) 20 MG tablet TAKE 1 TABLET (20 MG TOTAL) BY MOUTH AT BEDTIME. 09/03/16   Dorena Cookey, MD  VIAGRA 100 MG tablet  04/01/14   [provider]    Family History Family History  Problem Relation Age of Onset  . Diabetes Mother   . Stomach cancer Other   . Diabetes Maternal Aunt   . Diabetes Maternal Grandmother   . Heart disease Neg Hx   . Colon cancer Neg Hx   . Esophageal cancer Neg Hx   . Rectal cancer Neg Hx     Social History Social History   Tobacco Use  . Smoking status: Never Smoker  . Smokeless tobacco: Never Used  Substance Use Topics  . Alcohol use: Not Currently    Comment: rare beer  . Drug use: Not Currently     Allergies   Sulfa antibiotics; Celebrex [celecoxib]; Penicillins; and Sulfonamide derivatives   Review of Systems Review of Systems  Eyes: Positive for redness. Negative for blurred vision, double vision, photophobia, discharge and itching.  Neurological: Negative for headaches.  All other systems reviewed and are negative.    Physical Exam Triage Vital Signs ED Triage Vitals  Enc Vitals Group     BP 11/11/17 1525 129/78     Pulse Rate 11/11/17 1525 71     Resp 11/11/17 1525 16     Temp 11/11/17 1525 98.2 F (36.8 C)     Temp Source 11/11/17 1525 Oral     SpO2 11/11/17 1525 99 %     Weight 11/11/17 1527 278 lb 12.8 oz (126.5 kg)     Height 11/11/17 1527 _0  (1.778 m)     Head Circumference --      Peak Flow --      Pain Score 11/11/17 1527 0     Pain Loc --      Pain Edu? --      Excl. in [place]? --    No data found.  Updated Vital Signs BP 129/78 (BP Location: Right Arm)   Pulse 71   Temp 98.2 F (36.8 C) (Oral)   Resp 16   Ht _1  (1.778 m)   Wt 278 lb 12.8 oz (126.5 kg)   SpO2 99%   BMI 40.00 kg/m   Visual Acuity Right Eye Distance: 20/15 Left Eye Distance: 20/20 Bilateral Distance: 20/15  Right Eye Near:   Left Eye Near:    Bilateral  Near:     Physical Exam  Constitutional: He appears well-developed and well-nourished. No distress.  HENT:  Head: Normocephalic.  Right Ear: Tympanic membrane, external ear and ear canal normal.  Left Ear:  Tympanic membrane, external ear and ear canal normal.  Nose: Nose normal.  Mouth/Throat: Oropharynx is clear and moist.  Eyes: Pupils are equal, round, and reactive to light. EOM and lids are normal. Left eye exhibits no chemosis, no discharge, no exudate and no hordeolum. No foreign body present in the left eye. Left conjunctiva has a hemorrhage.    Medial subconjunctival hemorrhage left eye as noted on diagram.  Appears to be resolving.  Neck: Neck supple.  Cardiovascular: Normal rate.  Pulmonary/Chest: Effort normal.  Lymphadenopathy:    He has no cervical adenopathy.  Neurological: He is alert.  Skin: Skin is warm and dry.  Nursing note and vitals reviewed.    UC Treatments / Results  Labs (all labs ordered are listed, but only abnormal results are displayed) Labs Reviewed - No data to display  EKG None Radiology No results found.  Procedures Procedures (including critical care time)  Medications Ordered in UC Medications - No data to display   Initial Impression / Assessment and Plan / UC Course  I have reviewed the triage vital signs and the nursing notes.  Pertinent labs & imaging results that were available during my care of the patient were reviewed by me and considered in my medical decision making (see chart for details).    Reassurance. May use refrigerated lubricating eye drops several times daily. Followup with ophthalmologist if not improved one week.    Final Clinical Impressions(s) / UC Diagnoses   Final diagnoses:  Non-traumatic subconjunctival hemorrhage of left eye    ED Discharge Orders    None           Kandra Nicolas, MD 11/11/17 [phone]

## 2017-11-11 NOTE — ED Triage Notes (Signed)
Pt c/o of left eye redness. States he went to bed on Saturday and upon waking on Sunday his eye was red. There is no pain or itching.

## 2017-11-20 ENCOUNTER — Other Ambulatory Visit: Payer: Self-pay | Admitting: Family Medicine

## 2017-12-02 ENCOUNTER — Other Ambulatory Visit: Payer: Self-pay | Admitting: Endocrinology

## 2017-12-09 ENCOUNTER — Other Ambulatory Visit (INDEPENDENT_AMBULATORY_CARE_PROVIDER_SITE_OTHER): Payer: BLUE CROSS/BLUE SHIELD

## 2017-12-09 DIAGNOSIS — E1165 Type 2 diabetes mellitus with hyperglycemia: Secondary | ICD-10-CM | POA: Diagnosis not present

## 2017-12-09 LAB — BASIC METABOLIC PANEL
BUN: 12 mg/dL (ref 6–23)
CALCIUM: 9.1 mg/dL (ref 8.4–10.5)
CO2: 25 meq/L (ref 19–32)
Chloride: 105 mEq/L (ref 96–112)
Creatinine, Ser: 1.12 mg/dL (ref 0.40–1.50)
GFR: 87.92 mL/min (ref >60.00)
GLUCOSE: 152 mg/dL — AB (ref 70–99)
POTASSIUM: 3.8 meq/L (ref 3.5–5.1)
SODIUM: 137 meq/L (ref 135–145)

## 2017-12-09 LAB — HEMOGLOBIN A1C: Hgb A1c MFr Bld: 6.9 % — ABNORMAL HIGH (ref 4.6–6.5)

## 2017-12-16 ENCOUNTER — Encounter: Payer: Self-pay | Admitting: Endocrinology

## 2017-12-16 ENCOUNTER — Ambulatory Visit: Payer: BLUE CROSS/BLUE SHIELD | Admitting: Endocrinology

## 2017-12-16 VITALS — BP 124/82 | HR 84 | Ht 70.0 in | Wt 278.6 lb

## 2017-12-16 DIAGNOSIS — E1165 Type 2 diabetes mellitus with hyperglycemia: Secondary | ICD-10-CM | POA: Diagnosis not present

## 2017-12-16 DIAGNOSIS — E291 Testicular hypofunction: Secondary | ICD-10-CM | POA: Diagnosis not present

## 2017-12-16 NOTE — Progress Notes (Signed)
Patient ID: Clifford Kramer, male   DOB: 12-25-1965, 52 y.o.   MRN: [id number]   Reason for Appointment : Endocrinology follow-up  History of Present Illness          Diagnosis: Type 2 diabetes mellitus, date of diagnosis: 2005     Background information: He was probably started on metformin at the time of diagnosis when his blood sugars were not very high About 5-6 years ago because of higher blood sugars he was also given glipizide  He appeared to have  progression of his diabetes with A1c going up to 8.1% in 01/2013; also had difficulty with losing weight before he was started on Victoza.  With this his blood sugars were significantly better His glipizide was reduced to 2.5 mg in 10/14 because of rare hypoglycemia He has taken 1.8 mg of Victoza since 7/15   RECENT history:   Antihyperglycemic drugs : Ozempic 0.5 mg weekly, and Amaryl 1 mg am, Synjardy XR 1.11/998, 2 tablets daily  His A1c had been under 7% and is back down to 6.9, compared to 7.2 on the last visit  Work routine: He is starting work at [employer] AM  Current management, blood sugar patterns and problems:  He has taken Ozempic for about 31-monthnow  However after his last visit he realized that he would not get his medication consistently because of not pushing and holding the plunger of the pen device to complete the injection and his blood sugars were higher  His insurance changed him from IPortalto taking Jardiance now in combination with metformin  He has lost 4 pounds since his last visit and may be benefiting from continuing [place] However he checks blood sugars mostly fasting and these can be as high as 168  Occasionally with not watching his type of meals or carbohydrate his blood sugar may be as high as 185 such as yesterday  No side effects with Ozempic  He is still trying to exercise fairly regularly either at the gym or walking   Side effects from medications have been: None       Monitors  blood glucose: once a day          Glucometer:      Accu-Chek  Blood Glucose readings by download:  Mean values apply above for all meters except median for One Touch  PRE-MEAL Fasting Lunch Dinner Bedtime Overall  Glucose range:  100-168      Mean/median: 140    140   POST-MEAL PC Breakfast PC Lunch PC Dinner  Glucose range:   185, 168   Mean/median:      Previous readings:  Mean values apply above for all meters except median for One Touch  PRE-MEAL Fasting Lunch Dinner Bedtime Overall  Glucose range: 98-1 38       Mean/median:     148   POST-MEAL PC Breakfast PC Lunch PC Dinner  Glucose range:  130-190  189   Mean/median:  160     LIFESTYLE: Meals: 3 meals per day. Dinner 5 pm, Bfst 6-7 am; lunch 12 am;  eating out periodically  His breakfast:  eggs and toast or meat  Usually eating fruit and sandwich at lunch, usually a grilled meat at suppertime   Physical activity: exercise: 4-5/7 days a week, at gym or walking, 45 min      Dietician visit: 05/2013           Wt Readings from Last 3 Encounters:  12/16/17 278 lb 9.6 oz (126.4 kg)  11/11/17 278 lb 12.8 oz (126.5 kg)  10/21/17 282 lb (127.9 kg)   Lab Results  Component Value Date   HGBA1C 6.9 (H) 12/09/2017   HGBA1C 7.2 (H) 09/24/2017   HGBA1C 6.7 (H) 05/27/2017   Lab Results  Component Value Date   MICROALBUR <0.7 09/26/2017   LDLCALC 47 09/24/2017   CREATININE 1.12 12/09/2017    Other active problems including new problems are in review of systems    No visits with results within 1 Week(s) from this visit.  Latest known visit with results is:  Lab on 12/09/2017  Component Date Value Ref Range Status  . Sodium 12/09/2017 137  135 - 145 mEq/L Final  . Potassium 12/09/2017 3.8  3.5 - 5.1 mEq/L Final  . Chloride 12/09/2017 105  96 - 112 mEq/L Final  . CO2 12/09/2017 25  19 - 32 mEq/L Final  . Glucose, Bld 12/09/2017 152* 70 - 99 mg/dL Final  . BUN 12/09/2017 12  6 - 23 mg/dL Final  . Creatinine, Ser  12/09/2017 1.12  0.40 - 1.50 mg/dL Final  . Calcium 12/09/2017 9.1  8.4 - 10.5 mg/dL Final  . GFR 12/09/2017 87.92  >60.00 mL/min Final  . Hgb A1c MFr Bld 12/09/2017 6.9* 4.6 - 6.5 % Final   Glycemic Control Guidelines for People with Diabetes:Non Diabetic:  <6%Goal of Therapy: <7%Additional Action Suggested:  >8%     Allergies as of 12/16/2017      Reactions   Sulfa Antibiotics Itching   REACTION: itching   Celebrex [celecoxib] Itching   REACTION: itching   Penicillins Itching   Sulfonamide Derivatives Itching   REACTION: itching      Medication List        Accurate as of 12/16/17  9:30 PM. Always use your most recent med list.          allopurinol 300 MG tablet Commonly known as:  ZYLOPRIM Take 1 tablet (300 mg total) by mouth daily.   amLODipine 5 MG tablet Commonly known as:  NORVASC Take 1 tablet (5 mg total) by mouth daily.   ANDRODERM 4 MG/24HR Pt24 patch Generic drug:  testosterone PLACE 2 PATCHES ONTO THE SKIN DAILY   aspirin 81 MG tablet Take 81 mg by mouth daily.   BD PEN NEEDLE NANO U/F 32G X 4 MM Misc Generic drug:  Insulin Pen Needle USE EVERY DAY   Empagliflozin-metFORMIN HCl ER 12.11-998 MG Tb24 Commonly known as:  SYNJARDY XR Take 2 tablets by mouth daily with breakfast.   fluticasone 50 MCG/ACT nasal spray Commonly known as:  FLONASE PLACE 2 SPRAYS INTO THE NOSE DAILY.   glimepiride 1 MG tablet Commonly known as:  AMARYL TAKE 1 TABLET (1 MG TOTAL) BY MOUTH DAILY WITH BREAKFAST.   glucose blood test strip Commonly known as:  ACCU-CHEK GUIDE Use as instructed   ONE TOUCH ULTRA TEST test strip Generic drug:  glucose blood USE AS INSTRUCTED TO CHECK BLOOD SUGAR TWO TIMES A DAY DX CODE E11.9   lisinopril 20 MG tablet Commonly known as:  PRINIVIL,ZESTRIL Take 1 tablet (20 mg total) by mouth daily.   omeprazole 40 MG capsule Commonly known as:  PRILOSEC Take 1 capsule (40 mg total) by mouth daily.   ONETOUCH DELICA LANCETS FINE  Misc Use to check blood sugar 2 times per day, dx code E11.9   ACCU-CHEK FASTCLIX LANCETS Misc Use to check blood sugar 3 times daily. Dx Code E11.9  ONETOUCH VERIO IQ SYSTEM w/Device Kit Use to check blood sugar 2 times daily   Semaglutide 0.25 or 0.5 MG/DOSE Sopn Commonly known as:  OZEMPIC Inject 0.5 mg into the skin once a week.   simvastatin 20 MG tablet Commonly known as:  ZOCOR TAKE 1 TABLET AT BEDTIME   VIAGRA 100 MG tablet Generic drug:  sildenafil       Allergies:  Allergies  Allergen Reactions  . Sulfa Antibiotics Itching    REACTION: itching  . Celebrex [Celecoxib] Itching    REACTION: itching  . Penicillins Itching  . Sulfonamide Derivatives Itching    REACTION: itching    Past Medical History:  Diagnosis Date  . Diabetes mellitus   . 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 Gout   . Hyperlipidemia   . Hypertension   . Sleep apnea    CPAP use     Past Surgical History:  Procedure Laterality Date  . COLONOSCOPY  2015  . KNEE SURGERY Left 2005    Family History  Problem Relation Age of Onset  . Diabetes Mother   . Stomach cancer Other   . Diabetes Maternal Aunt   . Diabetes Maternal Grandmother   . Heart disease Neg Hx   . Colon cancer Neg Hx   . Esophageal cancer Neg Hx   . Rectal cancer Neg Hx     Social History:  reports that he has never smoked. He has never used smokeless tobacco. He reports that he drank alcohol. He reports that he has current or past drug history.    Review of Systems   He is on treatment for sleep apnea  HYPERTENSION: This is well controlled with Norvasc 5 mg and  20 mg lisinopril HCTZ was stopped with using SGLT 2 drugs and because of hyperuricemia   BP Readings from Last 3 Encounters:  12/16/17 124/82  11/11/17 129/78  10/21/17 131/85     GOUT: He has had Previous recurrent episodes of gout and various joints including knee and elbow periodically Baseline uric acid was 8.4 done in 2012   Taking  allopurinol 300 mg daily regularly with last uric acid 3.3      Hyperlipidemia: LDL has been controlled, baseline previously 109, has been taking simvastatin 20 mg. Has a low HDL  Lab Results  Component Value Date   CHOL 104 09/24/2017   HDL 32.20 (L) 09/24/2017   LDLCALC 47 09/24/2017   TRIG 127.0 09/24/2017   CHOLHDL 3 09/24/2017       HYPOGONADISM:  He has been on testosterone supplements since 2011 when his level was mildly low around 300  but no further evaluation done.   He has a relatively low level on using Axiron and had difficulty with the application With Androderm at therapeutic doses he had improved testosterone levels and less fatigue With  taking 6 mg Androderm total dosage his level Was below 300  Also takes Viagra for erectile dysfunction  His testosterone level last with using 8 mg Androderm is improved at 560 Not complaining of fatigue   Lab Results  Component Value Date   TESTOSTERONE 560.22 09/24/2017     LABS:  No visits with results within 1 Week(s) from this visit.  Latest known visit with results is:  Lab on 12/09/2017  Component Date Value Ref Range Status  . Sodium 12/09/2017 137  135 - 145 mEq/L Final  . Potassium 12/09/2017 3.8  3.5 - 5.1 mEq/L Final  . Chloride 12/09/2017 105  96 -  112 mEq/L Final  . CO2 12/09/2017 25  19 - 32 mEq/L Final  . Glucose, Bld 12/09/2017 152* 70 - 99 mg/dL Final  . BUN 12/09/2017 12  6 - 23 mg/dL Final  . Creatinine, Ser 12/09/2017 1.12  0.40 - 1.50 mg/dL Final  . Calcium 12/09/2017 9.1  8.4 - 10.5 mg/dL Final  . GFR 12/09/2017 87.92  >60.00 mL/min Final  . Hgb A1c MFr Bld 12/09/2017 6.9* 4.6 - 6.5 % Final   Glycemic Control Guidelines for People with Diabetes:Non Diabetic:  <6%Goal of Therapy: <7%Additional Action Suggested:  >8%      Physical Examination:  BP 124/82 (BP Location: Left Arm, Patient Position: Sitting, Cuff Size: Normal)   Pulse 84   Ht '5\' 10"'  (1.778 m)   Wt 278 lb 9.6 oz (126.4 kg)    SpO2 95%   BMI 39.97 kg/m          ASSESSMENT/PLAN:   Diabetes type 2, with obesity, BMI 40  See history of present illness for detailed discussion of his current management, blood sugar patterns and problems identified  His A1c is back below 7 although he still has occasional higher readings even fasting His postprandial readings are not being checked May be benefiting somewhat from [place] compared to Victoza but weight loss has been only minimal He is still compliant with exercise  For more consistent and better benefit of overall blood sugar control and weight he will increase his Ozempic to 1 mg after his current prescription runs out Continue Synjardy and low-dose Amaryl but may stop Amaryl if he starts getting low normal sugars especially during the day He does need to check some readings at bedtime which is usually an hour or 2 after his evening meal on workdays  HYPOGONADISM: His level will need to be checked again   HYPERTENSION:  His blood pressure is controlled with 20 mg lisinopril and Norvasc     Patient Instructions  Check at bedtime  Check blood sugars on waking up  2-3/7  Also check blood sugars about 2 hours after a meal and do this after different meals by rotation  Recommended blood sugar levels on waking up is 90-130 and about 2 hours after meal is 130-160  Please bring your blood sugar monitor to each visit, thank you  Next rx 47m ozempic     Anjana Cheek 12/16/2017, 9:30 PM

## 2017-12-16 NOTE — Patient Instructions (Addendum)
Check at bedtime  Check blood sugars on waking up  2-3/7  Also check blood sugars about 2 hours after a meal and do this after different meals by rotation  Recommended blood sugar levels on waking up is 90-130 and about 2 hours after meal is 130-160  Please bring your blood sugar monitor to each visit, thank you  Next rx 1mg  ozempic

## 2017-12-22 ENCOUNTER — Other Ambulatory Visit: Payer: Self-pay | Admitting: Family Medicine

## 2018-01-21 ENCOUNTER — Encounter: Payer: Self-pay | Admitting: Endocrinology

## 2018-01-22 ENCOUNTER — Other Ambulatory Visit: Payer: Self-pay

## 2018-01-22 MED ORDER — SEMAGLUTIDE (1 MG/DOSE) 2 MG/1.5ML ~~LOC~~ SOPN: 1.0000 mg | PEN_INJECTOR | SUBCUTANEOUS | 3 refills | Status: DC

## 2018-02-17 ENCOUNTER — Other Ambulatory Visit: Payer: Self-pay | Admitting: Endocrinology

## 2018-03-10 ENCOUNTER — Other Ambulatory Visit: Payer: Self-pay

## 2018-03-10 MED ORDER — TESTOSTERONE 4 MG/24HR TD PT24: MEDICATED_PATCH | TRANSDERMAL | 0 refills | Status: DC

## 2018-03-11 ENCOUNTER — Other Ambulatory Visit: Payer: Self-pay | Admitting: Endocrinology

## 2018-04-07 ENCOUNTER — Other Ambulatory Visit (INDEPENDENT_AMBULATORY_CARE_PROVIDER_SITE_OTHER): Payer: BLUE CROSS/BLUE SHIELD

## 2018-04-07 DIAGNOSIS — E1165 Type 2 diabetes mellitus with hyperglycemia: Secondary | ICD-10-CM | POA: Diagnosis not present

## 2018-04-07 DIAGNOSIS — E291 Testicular hypofunction: Secondary | ICD-10-CM

## 2018-04-07 LAB — COMPREHENSIVE METABOLIC PANEL
ALK PHOS: 89 U/L (ref 39–117)
ALT: 21 U/L (ref 0–53)
AST: 17 U/L (ref 0–37)
Albumin: 4.3 g/dL (ref 3.5–5.2)
BILIRUBIN TOTAL: 0.4 mg/dL (ref 0.2–1.2)
BUN: 14 mg/dL (ref 6–23)
CO2: 26 mEq/L (ref 19–32)
Calcium: 9.2 mg/dL (ref 8.4–10.5)
Chloride: 104 mEq/L (ref 96–112)
Creatinine, Ser: 1.37 mg/dL (ref 0.40–1.50)
GFR: 69.59 mL/min (ref >60.00)
GLUCOSE: 149 mg/dL — AB (ref 70–99)
Potassium: 3.7 mEq/L (ref 3.5–5.1)
SODIUM: 137 meq/L (ref 135–145)
TOTAL PROTEIN: 7.4 g/dL (ref 6.0–8.3)

## 2018-04-07 LAB — HEMOGLOBIN A1C: Hgb A1c MFr Bld: 6.8 % — ABNORMAL HIGH (ref 4.6–6.5)

## 2018-04-07 LAB — TESTOSTERONE: TESTOSTERONE: 469.41 ng/dL (ref 300.00–890.00)

## 2018-04-14 ENCOUNTER — Ambulatory Visit: Payer: BLUE CROSS/BLUE SHIELD | Admitting: Endocrinology

## 2018-04-14 ENCOUNTER — Encounter: Payer: Self-pay | Admitting: Endocrinology

## 2018-04-14 VITALS — BP 128/66 | HR 91 | Ht 70.0 in | Wt 274.0 lb

## 2018-04-14 DIAGNOSIS — E1165 Type 2 diabetes mellitus with hyperglycemia: Secondary | ICD-10-CM

## 2018-04-14 DIAGNOSIS — E291 Testicular hypofunction: Secondary | ICD-10-CM

## 2018-04-14 DIAGNOSIS — Z23 Encounter for immunization: Secondary | ICD-10-CM

## 2018-04-14 NOTE — Progress Notes (Signed)
Patient ID: Clifford Kramer, male   DOB: [birth year], 52 y.o.   MRN: [id number]   Reason for Appointment : Endocrinology follow-up  History of Present Illness          Diagnosis: Type 2 diabetes mellitus, date of diagnosis: 2005     Background information: He was probably started on metformin at the time of diagnosis when his blood sugars were not very high About 5-6 years ago because of higher blood sugars he was also given glipizide  He appeared to have  progression of his diabetes with A1c going up to 8.1% in 01/2013; also had difficulty with losing weight before he was started on Victoza.  With this his blood sugars were significantly better His glipizide was reduced to 2.5 mg in 10/14 because of rare hypoglycemia He has taken 1.8 mg of Victoza since 7/15   RECENT history:   Antihyperglycemic drugs : Ozempic 1 mg weekly, and Amaryl 1 mg pcs, Synjardy XR 1.11/998, 2 tablets daily  His A1c now has been under 7% and is 6.8  Work routine: He is starting work at [employer] AM  Current management, blood sugar patterns and problems:  He has taken Ozempic 1 mg since about August  However he still has only lost 4 pounds since his last visit  So this may be from occasional difficulties with exercising because of knee pain  He is usually trying to check his sugars when he wakes up on workdays which could be around 2 AM and these are mildly increased although he thinks on his days off his fasting blood sugar is not as high  He goes to sleep soon after his evening meal and does not check postprandial readings  Blood sugars midmorning are usually fairly good except if eating and checking right away  Carbohydrate intake is variable and has only 1 significantly high reading of 219  No side effects with Ozempic 1 mg   Side effects from medications have been: None       Monitors blood glucose: once a day          Glucometer:      Accu-Chek  Blood Glucose readings by  download:   PRE-MEAL Fasting Lunch Dinner Bedtime Overall  Glucose range:  139-166  99-159  108   99-219  Mean/median: 148     139   POST-MEAL PC Breakfast PC Lunch PC Dinner  Glucose range:   ?  Mean/median:      Previous readings:  PRE-MEAL Fasting Lunch Dinner Bedtime Overall  Glucose range:  100-168      Mean/median: 140    140   POST-MEAL PC Breakfast PC Lunch PC Dinner  Glucose range:   185, 168   Mean/median:       LIFESTYLE: Meals: 3 meals per day. Dinner 5 pm, Bfst 6-7 am; lunch 12 am;  eating out periodically  His breakfast:  eggs and toast or meat  Usually eating fruit and sandwich at lunch, usually a grilled meat at suppertime   Physical activity: exercise: 0-5/7 days a week, at gym or walking, 45 min      Dietician visit: 05/2013           Wt Readings from Last 3 Encounters:  04/14/18 274 lb (124.3 kg)  12/16/17 278 lb 9.6 oz (126.4 kg)  11/11/17 278 lb 12.8 oz (126.5 kg)   Lab Results  Component Value Date   HGBA1C 6.8 (H) 04/07/2018   HGBA1C 6.9 (H) 12/09/2017  HGBA1C 7.2 (H) 09/24/2017   Lab Results  Component Value Date   MICROALBUR <0.7 09/26/2017   LDLCALC 47 09/24/2017   CREATININE 1.37 04/07/2018    Other active problems including new problems are in review of systems    No visits with results within 1 Week(s) from this visit.  Latest known visit with results is:  Lab on 04/07/2018  Component Date Value Ref Range Status  . Testosterone 04/07/2018 469.41  300.00 - 890.00 ng/dL Final  . Sodium 04/07/2018 137  135 - 145 mEq/L Final  . Potassium 04/07/2018 3.7  3.5 - 5.1 mEq/L Final  . Chloride 04/07/2018 104  96 - 112 mEq/L Final  . CO2 04/07/2018 26  19 - 32 mEq/L Final  . Glucose, Bld 04/07/2018 149* 70 - 99 mg/dL Final  . BUN 04/07/2018 14  6 - 23 mg/dL Final  . Creatinine, Ser 04/07/2018 1.37  0.40 - 1.50 mg/dL Final  . Total Bilirubin 04/07/2018 0.4  0.2 - 1.2 mg/dL Final  . Alkaline Phosphatase 04/07/2018 89  39 - 117 U/L  Final  . AST 04/07/2018 17  0 - 37 U/L Final  . ALT 04/07/2018 21  0 - 53 U/L Final  . Total Protein 04/07/2018 7.4  6.0 - 8.3 g/dL Final  . Albumin 04/07/2018 4.3  3.5 - 5.2 g/dL Final  . Calcium 04/07/2018 9.2  8.4 - 10.5 mg/dL Final  . GFR 04/07/2018 69.59  >60.00 mL/min Final  . Hgb A1c MFr Bld 04/07/2018 6.8* 4.6 - 6.5 % Final   Glycemic Control Guidelines for People with Diabetes:Non Diabetic:  <6%Goal of Therapy: <7%Additional Action Suggested:  >8%     Allergies as of 04/14/2018      Reactions   Sulfa Antibiotics Itching   REACTION: itching   Celebrex [celecoxib] Itching   REACTION: itching   Penicillins Itching   Sulfonamide Derivatives Itching   REACTION: itching      Medication List        Accurate as of 04/14/18  4:11 PM. Always use your most recent med list.          allopurinol 300 MG tablet Commonly known as:  ZYLOPRIM Take 1 tablet (300 mg total) by mouth daily.   amLODipine 5 MG tablet Commonly known as:  NORVASC Take 1 tablet (5 mg total) by mouth daily.   aspirin 81 MG tablet Take 81 mg by mouth daily.   BD PEN NEEDLE NANO U/F 32G X 4 MM Misc Generic drug:  Insulin Pen Needle USE EVERY DAY   Empagliflozin-metFORMIN HCl ER 12.11-998 MG Tb24 Take 2 tablets by mouth daily with breakfast.   fluticasone 50 MCG/ACT nasal spray Commonly known as:  FLONASE PLACE 2 SPRAYS INTO THE NOSE DAILY.   glimepiride 1 MG tablet Commonly known as:  AMARYL TAKE 1 TABLET (1 MG TOTAL) BY MOUTH DAILY WITH BREAKFAST.   glucose blood test strip Use as instructed   ONE TOUCH ULTRA TEST test strip Generic drug:  glucose blood USE AS INSTRUCTED TO CHECK BLOOD SUGAR TWO TIMES A DAY DX CODE E11.9   lisinopril 20 MG tablet Commonly known as:  PRINIVIL,ZESTRIL Take 1 tablet (20 mg total) by mouth daily.   omeprazole 40 MG capsule Commonly known as:  PRILOSEC TAKE 1 CAPSULE BY MOUTH EVERY DAY   ONETOUCH DELICA LANCETS FINE Misc Use to check blood sugar 2 times  per day, dx code E11.9   ACCU-CHEK FASTCLIX LANCETS Misc Use to check blood sugar 3 times  daily. Dx Code E11.9   ONETOUCH VERIO IQ SYSTEM w/Device Kit Use to check blood sugar 2 times daily   OZEMPIC (1 MG/DOSE) 2 MG/1.5ML Sopn Generic drug:  Semaglutide (1 MG/DOSE) INJECT 1 MG INTO THE SKIN ONCE A WEEK.   simvastatin 20 MG tablet Commonly known as:  ZOCOR TAKE 1 TABLET AT BEDTIME   testosterone 4 MG/24HR Pt24 patch Commonly known as:  ANDRODERM PLACE 2 PATCHES ONTO THE SKIN DAILY   VIAGRA 100 MG tablet Generic drug:  sildenafil       Allergies:  Allergies  Allergen Reactions  . Sulfa Antibiotics Itching    REACTION: itching  . Celebrex [Celecoxib] Itching    REACTION: itching  . Penicillins Itching  . Sulfonamide Derivatives Itching    REACTION: itching    Past Medical History:  Diagnosis Date  . Diabetes mellitus   . 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 Gout   . Hyperlipidemia   . Hypertension   . Sleep apnea    CPAP use     Past Surgical History:  Procedure Laterality Date  . COLONOSCOPY  2015  . KNEE SURGERY Left 2005    Family History  Problem Relation Age of Onset  . Diabetes Mother   . Stomach cancer Other   . Diabetes Maternal Aunt   . Diabetes Maternal Grandmother   . Heart disease Neg Hx   . Colon cancer Neg Hx   . Esophageal cancer Neg Hx   . Rectal cancer Neg Hx     Social History:  reports that he has never smoked. He has never used smokeless tobacco. He reports that he drank alcohol. He reports that he has current or past drug history.    Review of Systems    HYPERTENSION: This is well controlled with Norvasc 5 mg and  20 mg lisinopril HCTZ was stopped with using SGLT 2 drugs and because of hyperuricemia Home readings around 125/76 recently  BP Readings from Last 3 Encounters:  04/14/18 128/66  12/16/17 124/82  11/11/17 129/78   Creatinine slightly higher:  Lab Results  Component Value Date   CREATININE 1.37  04/07/2018   CREATININE 1.12 12/09/2017   CREATININE 1.29 09/24/2017     GOUT: He has had recurrent episodes of probable gout in various joints including knee and elbow periodically Baseline uric acid was 8.4 done in 2012   Taking allopurinol 300 mg daily regularly with last uric acid 3.3      Hyperlipidemia: LDL has been controlled, baseline previously 109, has been taking simvastatin 20 mg. Has a low HDL  Lab Results  Component Value Date   CHOL 104 09/24/2017   HDL 32.20 (L) 09/24/2017   LDLCALC 47 09/24/2017   TRIG 127.0 09/24/2017   CHOLHDL 3 09/24/2017       HYPOGONADISM:  He has been on testosterone supplements since 2011 when his level was mildly low around 300  but no further evaluation done.   He has a relatively low level on using Axiron and had difficulty with the application With Androderm at therapeutic doses he had improved testosterone levels and less fatigue  His testosterone level last with using 8 mg Androderm is improved at 469 Currently not complaining of fatigue   Lab Results  Component Value Date   TESTOSTERONE 469.41 04/07/2018   Also takes Viagra for erectile dysfunction  LABS:  No visits with results within 1 Week(s) from this visit.  Latest known visit with results is:  Lab on 04/07/2018  Component Date Value Ref Range Status  . Testosterone 04/07/2018 469.41  300.00 - 890.00 ng/dL Final  . Sodium 04/07/2018 137  135 - 145 mEq/L Final  . Potassium 04/07/2018 3.7  3.5 - 5.1 mEq/L Final  . Chloride 04/07/2018 104  96 - 112 mEq/L Final  . CO2 04/07/2018 26  19 - 32 mEq/L Final  . Glucose, Bld 04/07/2018 149* 70 - 99 mg/dL Final  . BUN 04/07/2018 14  6 - 23 mg/dL Final  . Creatinine, Ser 04/07/2018 1.37  0.40 - 1.50 mg/dL Final  . Total Bilirubin 04/07/2018 0.4  0.2 - 1.2 mg/dL Final  . Alkaline Phosphatase 04/07/2018 89  39 - 117 U/L Final  . AST 04/07/2018 17  0 - 37 U/L Final  . ALT 04/07/2018 21  0 - 53 U/L Final  . Total Protein  04/07/2018 7.4  6.0 - 8.3 g/dL Final  . Albumin 04/07/2018 4.3  3.5 - 5.2 g/dL Final  . Calcium 04/07/2018 9.2  8.4 - 10.5 mg/dL Final  . GFR 04/07/2018 69.59  >60.00 mL/min Final  . Hgb A1c MFr Bld 04/07/2018 6.8* 4.6 - 6.5 % Final   Glycemic Control Guidelines for People with Diabetes:Non Diabetic:  <6%Goal of Therapy: <7%Additional Action Suggested:  >8%      Physical Examination:  BP 128/66   Pulse 91   Ht _0  (1.778 m)   Wt 274 lb (124.3 kg)   SpO2 98%   BMI 39.31 kg/m          ASSESSMENT/PLAN:   Diabetes type 2, with obesity, BMI 39  See history of present illness for detailed discussion of his current management, blood sugar patterns and problems identified  His A1c is consistently back below 7  He is doing better with the Ozempic and is on 1 mg for the last month Has lost 4 pounds Recently has had some introduction of his exercise routine and he should be able to do this more consistently Although his fasting readings are averaging over 140 has readings as low as 99 at other times and will not increase his Amaryl He is not able to check readings after meals partly because of his work schedule and then at times However he can try to do more readings after meals on his days off  Currently he will continue same regimen  HYPOGONADISM: Currently on 8 mg Androderm and his testosterone level is therapeutic  HYPERTENSION:  His blood pressure is controlled with 20 mg lisinopril and Norvasc  Renal function: Upper normal but since his blood pressure is not low normal or low will not change his medication regimen but consider reducing lisinopril dose if this persists  Influenza vaccine given   There are no Patient Instructions on file for this visit.    Elayne Snare 04/14/2018, 4:11 PM

## 2018-04-14 NOTE — Patient Instructions (Signed)
Freestyle [place] cost

## 2018-05-22 ENCOUNTER — Other Ambulatory Visit: Payer: Self-pay | Admitting: Internal Medicine

## 2018-05-22 MED ORDER — FREESTYLE LIBRE 14 DAY SENSOR MISC: 1.0000 | 11 refills | Status: DC

## 2018-05-22 MED ORDER — SCOPOLAMINE 1 MG/3DAYS TD PT72: 1.0000 | MEDICATED_PATCH | TRANSDERMAL | 3 refills | Status: DC

## 2018-05-22 MED ORDER — FREESTYLE LIBRE 14 DAY READER DEVI: 1.0000 | Freq: Once | 1 refills | Status: AC

## 2018-06-16 ENCOUNTER — Other Ambulatory Visit: Payer: Self-pay | Admitting: Endocrinology

## 2018-06-20 ENCOUNTER — Other Ambulatory Visit: Payer: Self-pay | Admitting: Endocrinology

## 2018-06-20 NOTE — Telephone Encounter (Signed)
Please refill if appropriate

## 2018-07-20 ENCOUNTER — Other Ambulatory Visit: Payer: Self-pay | Admitting: Endocrinology

## 2018-08-10 ENCOUNTER — Other Ambulatory Visit: Payer: Self-pay | Admitting: Endocrinology

## 2018-08-11 ENCOUNTER — Other Ambulatory Visit (INDEPENDENT_AMBULATORY_CARE_PROVIDER_SITE_OTHER): Payer: BLUE CROSS/BLUE SHIELD

## 2018-08-11 DIAGNOSIS — E291 Testicular hypofunction: Secondary | ICD-10-CM | POA: Diagnosis not present

## 2018-08-11 DIAGNOSIS — E1165 Type 2 diabetes mellitus with hyperglycemia: Secondary | ICD-10-CM

## 2018-08-11 LAB — COMPREHENSIVE METABOLIC PANEL
ALBUMIN: 4.1 g/dL (ref 3.5–5.2)
ALK PHOS: 87 U/L (ref 39–117)
ALT: 21 U/L (ref 0–53)
AST: 18 U/L (ref 0–37)
BILIRUBIN TOTAL: 0.3 mg/dL (ref 0.2–1.2)
BUN: 12 mg/dL (ref 6–23)
CALCIUM: 9 mg/dL (ref 8.4–10.5)
CO2: 23 mEq/L (ref 19–32)
Chloride: 108 mEq/L (ref 96–112)
Creatinine, Ser: 1.23 mg/dL (ref 0.40–1.50)
GFR: 78.71 mL/min (ref >60.00)
Glucose, Bld: 99 mg/dL (ref 70–99)
POTASSIUM: 3.9 meq/L (ref 3.5–5.1)
Sodium: 140 mEq/L (ref 135–145)
TOTAL PROTEIN: 7.2 g/dL (ref 6.0–8.3)

## 2018-08-11 LAB — MICROALBUMIN / CREATININE URINE RATIO
Creatinine,U: 144.3 mg/dL
Microalb Creat Ratio: 0.5 mg/g (ref 0.0–30.0)
Microalb, Ur: <0.7 mg/dL (ref 0.0–1.9)

## 2018-08-11 LAB — CBC
HEMATOCRIT: 42.6 % (ref 39.0–52.0)
HEMOGLOBIN: 14.3 g/dL (ref 13.0–17.0)
MCHC: 33.6 g/dL (ref 30.0–36.0)
MCV: 75.8 fl — ABNORMAL LOW (ref 78.0–100.0)
PLATELETS: 234 10*3/uL (ref 150.0–400.0)
RBC: 5.62 Mil/uL (ref 4.22–5.81)
RDW: 15.3 % (ref 11.5–15.5)
WBC: 6.1 10*3/uL (ref 4.0–10.5)

## 2018-08-11 LAB — LIPID PANEL
CHOL/HDL RATIO: 3
Cholesterol: 98 mg/dL (ref 0–200)
HDL: 36.3 mg/dL — AB (ref >39.00)
LDL CALC: 47 mg/dL (ref 0–99)
NONHDL: 61.47
TRIGLYCERIDES: 74 mg/dL (ref 0.0–149.0)
VLDL: 14.8 mg/dL (ref 0.0–40.0)

## 2018-08-11 LAB — TESTOSTERONE: Testosterone: 450.94 ng/dL (ref 300.00–890.00)

## 2018-08-11 LAB — HEMOGLOBIN A1C: HEMOGLOBIN A1C: 6.8 % — AB (ref 4.6–6.5)

## 2018-08-12 ENCOUNTER — Other Ambulatory Visit: Payer: BLUE CROSS/BLUE SHIELD

## 2018-08-12 DIAGNOSIS — R7309 Other abnormal glucose: Secondary | ICD-10-CM

## 2018-08-13 ENCOUNTER — Other Ambulatory Visit: Payer: Self-pay | Admitting: Endocrinology

## 2018-08-13 LAB — HEMOGLOBIN A1C
Hgb A1c MFr Bld: 7.2 % of total Hgb — ABNORMAL HIGH (ref <5.7)
Mean Plasma Glucose: 160 (calc)
eAG (mmol/L): 8.9 (calc)

## 2018-08-18 ENCOUNTER — Encounter: Payer: Self-pay | Admitting: Endocrinology

## 2018-08-18 ENCOUNTER — Ambulatory Visit: Payer: BLUE CROSS/BLUE SHIELD | Admitting: Endocrinology

## 2018-08-18 VITALS — BP 148/86 | HR 86 | Ht 70.0 in | Wt 279.8 lb

## 2018-08-18 DIAGNOSIS — E785 Hyperlipidemia, unspecified: Secondary | ICD-10-CM | POA: Diagnosis not present

## 2018-08-18 DIAGNOSIS — E1165 Type 2 diabetes mellitus with hyperglycemia: Secondary | ICD-10-CM

## 2018-08-18 DIAGNOSIS — E291 Testicular hypofunction: Secondary | ICD-10-CM | POA: Diagnosis not present

## 2018-08-18 DIAGNOSIS — I10 Essential (primary) hypertension: Secondary | ICD-10-CM | POA: Diagnosis not present

## 2018-08-18 MED ORDER — HYDROCHLOROTHIAZIDE 12.5 MG PO CAPS: 12.5000 mg | ORAL_CAPSULE | Freq: Every day | ORAL | 0 refills | Status: DC

## 2018-08-18 NOTE — Patient Instructions (Signed)
Call before next Rx of Lisinopril

## 2018-08-18 NOTE — Progress Notes (Signed)
Patient ID: Clifford Kramer, male   DOB: [birth year], 53 y.o.   MRN: [id number]   Reason for Appointment : Endocrinology follow-up  History of Present Illness          Diagnosis: Type 2 diabetes mellitus, date of diagnosis: 2005     Background information: He was probably started on metformin at the time of diagnosis when his blood sugars were not very high About 5-6 years ago because of higher blood sugars he was also given glipizide  He appeared to have  progression of his diabetes with A1c going up to 8.1% in 01/2013; also had difficulty with losing weight before he was started on Victoza.  With this his blood sugars were significantly better His glipizide was reduced to 2.5 mg in 10/14 because of rare hypoglycemia He has taken 1.8 mg of Victoza since 7/15   RECENT history:   Antihyperglycemic drugs : Ozempic 1 mg weekly, and Amaryl 1 mg pcs, Synjardy XR 12.11/998, 2 tablets daily  His A1c is again 6.8 done by the same lab  Work routine: He is starting work at [employer] AM  Current management, blood sugar patterns and problems:  He has checked his blood sugars much at all  Although he was trying the freestyle libre to monitor his blood sugar he was again checking blood sugars rather infrequently on this have only minimal amount of information on this.  Also now his insurance will not cover this  He seems to have relatively high readings when he wakes up and after breakfast on different days  More recently has been irregular with exercise except in the last couple of weeks  Still not clear if he has any high readings after dinner  However blood sugars on the CGM did not show consistently high readings during the night  His weight has gone up 5 pounds probably from a combination of inconsistent diet and decide  This is despite continuing Ozempic density already   Side effects from medications have been: None       Monitors blood glucose: once a day          Glucometer:       Accu-Chek  Blood Glucose readings by download:  Recent blood sugar range 124-164 with higher readings in the mornings With CGM his blood sugars are lower than fingerstick with lowest reading 99 the afternoon and highest about 240 after breakfast and average 141  Blood sugars not consistently high overnight on the CGM  LIFESTYLE: Meals: 3 meals per day. Dinner 5 pm, Bfst 9 am; lunch 2 pm;  eating out periodically  His breakfast:  eggs and toast or meat  Usually eating fruit and sandwich at lunch, usually a grilled meat at suppertime   Physical activity: exercise: 0-5/7 days a week, at gym or walking, 45 min      Dietician visit: 05/2013           Wt Readings from Last 3 Encounters:  08/18/18 279 lb 12.8 oz (126.9 kg)  04/14/18 274 lb (124.3 kg)  12/16/17 278 lb 9.6 oz (126.4 kg)   Lab Results  Component Value Date   HGBA1C 7.2 (H) 08/12/2018   HGBA1C 6.8 (H) 08/11/2018   HGBA1C 6.8 (H) 04/07/2018   Lab Results  Component Value Date   MICROALBUR <0.7 08/11/2018   LDLCALC 47 08/11/2018   CREATININE 1.23 08/11/2018    Other active problems including new problems are in review of systems    Lab on  08/12/2018  Component Date Value Ref Range Status  . Hgb A1c MFr Bld 08/12/2018 7.2* <5.7 % of total Hgb Final   Comment: For someone without known diabetes, a hemoglobin A1c value of 6.5% or greater indicates that they may have  diabetes and this should be confirmed with a follow-up  test. . For someone with known diabetes, a value <7% indicates  that their diabetes is well controlled and a value  greater than or equal to 7% indicates suboptimal  control. A1c targets should be individualized based on  duration of diabetes, age, comorbid conditions, and  other considerations. . Currently, no consensus exists regarding use of hemoglobin A1c for diagnosis of diabetes for children. .   . Mean Plasma Glucose 08/12/2018 160  (calc) Final  . eAG (mmol/L) 08/12/2018 8.9   (calc) Final    Allergies as of 08/18/2018      Reactions   Sulfa Antibiotics Itching   REACTION: itching   Celebrex [celecoxib] Itching   REACTION: itching   Penicillins Itching   Sulfonamide Derivatives Itching   REACTION: itching      Medication List       Accurate as of August 18, 2018  3:51 PM. Always use your most recent med list.        allopurinol 300 MG tablet Commonly known as:  ZYLOPRIM Take 1 tablet (300 mg total) by mouth daily.   amLODipine 5 MG tablet Commonly known as:  NORVASC Take 1 tablet (5 mg total) by mouth daily.   ANDRODERM 4 MG/24HR Pt24 patch Generic drug:  testosterone PLACE 2 PATCHES ONTO THE SKIN DAILY   aspirin 81 MG tablet Take 81 mg by mouth daily.   BD PEN NEEDLE NANO U/F 32G X 4 MM Misc Generic drug:  Insulin Pen Needle USE EVERY DAY   fluticasone 50 MCG/ACT nasal spray Commonly known as:  FLONASE PLACE 2 SPRAYS INTO THE NOSE DAILY.   FREESTYLE LIBRE 14 DAY SENSOR Misc 1 each by Does not apply route every 14 (fourteen) days. Change every 2 weeks   glimepiride 1 MG tablet Commonly known as:  AMARYL TAKE 1 TABLET (1 MG TOTAL) BY MOUTH DAILY WITH BREAKFAST.   glucose blood test strip Commonly known as:  ACCU-CHEK GUIDE Use as instructed   ONE TOUCH ULTRA TEST test strip Generic drug:  glucose blood USE AS INSTRUCTED TO CHECK BLOOD SUGAR TWO TIMES A DAY DX CODE E11.9   lisinopril 20 MG tablet Commonly known as:  PRINIVIL,ZESTRIL TAKE 1 TABLET BY MOUTH EVERY DAY   omeprazole 40 MG capsule Commonly known as:  PRILOSEC TAKE 1 CAPSULE BY MOUTH EVERY DAY   ONETOUCH DELICA LANCETS FINE Misc Use to check blood sugar 2 times per day, dx code E11.9   ACCU-CHEK FASTCLIX LANCETS Misc Use to check blood sugar 3 times daily. Dx Code E11.9   ONETOUCH VERIO IQ SYSTEM w/Device Kit Use to check blood sugar 2 times daily   OZEMPIC (1 MG/DOSE) 2 MG/1.5ML Sopn Generic drug:  Semaglutide (1 MG/DOSE) INJECT 1 MG INTO THE SKIN ONCE  A WEEK.   scopolamine 1 MG/3DAYS Commonly known as:  TRANSDERM-SCOP (1.5 MG) Place 1 patch (1.5 mg total) onto the skin every 3 (three) days.   simvastatin 20 MG tablet Commonly known as:  ZOCOR TAKE 1 TABLET AT BEDTIME   SYNJARDY XR 12.11-998 MG Tb24 Generic drug:  Empagliflozin-metFORMIN HCl ER TAKE 2 TABLETS BY MOUTH DAILY WITH BREAKFAST.   VIAGRA 100 MG tablet Generic drug:  sildenafil       Allergies:  Allergies  Allergen Reactions  . Sulfa Antibiotics Itching    REACTION: itching  . Celebrex [Celecoxib] Itching    REACTION: itching  . Penicillins Itching  . Sulfonamide Derivatives Itching    REACTION: itching    Past Medical History:  Diagnosis Date  . Diabetes mellitus   . 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 Gout   . Hyperlipidemia   . Hypertension   . Sleep apnea    CPAP use     Past Surgical History:  Procedure Laterality Date  . COLONOSCOPY  2015  . KNEE SURGERY Left 2005    Family History  Problem Relation Age of Onset  . Diabetes Mother   . Stomach cancer Other   . Diabetes Maternal Aunt   . Diabetes Maternal Grandmother   . Heart disease Neg Hx   . Colon cancer Neg Hx   . Esophageal cancer Neg Hx   . Rectal cancer Neg Hx     Social History:  reports that he has never smoked. He has never used smokeless tobacco. He reports previous alcohol use. He reports previous drug use.    Review of Systems    HYPERTENSION: This is well controlled with Norvasc 5 mg and  20 mg lisinopril HCTZ was stopped with using SGLT 2 drugs and because of hyperuricemia  Home readings around upto 153/94;  Recently 126/80s  BP Readings from Last 3 Encounters:  08/18/18 (!) 148/86  04/14/18 128/66  12/16/17 124/82   Creatinine slightly better:  Lab Results  Component Value Date   CREATININE 1.23 08/11/2018   CREATININE 1.37 04/07/2018   CREATININE 1.12 12/09/2017     GOUT: He has had recurrent episodes of probable gout in various joints including  knee and elbow in the past Baseline uric acid was 8.4 done in 2012   Taking allopurinol 300 mg daily regularly with last uric acid 3.3      Hyperlipidemia: LDL has been controlled, baseline previously 109, has been taking simvastatin 20 mg. Has a low HDL  Lab Results  Component Value Date   CHOL 98 08/11/2018   HDL 36.30 (L) 08/11/2018   LDLCALC 47 08/11/2018   TRIG 74.0 08/11/2018   CHOLHDL 3 08/11/2018       HYPOGONADISM:  He has been on testosterone supplements since 2011 when his level was mildly low around 300  but no further evaluation done.   He has a relatively low testosterone level while using Axiron and had difficulty with the application With Androderm patches he had improved testosterone levels and less fatigue  His testosterone level consistent with using 8 mg Androderm in the normal range Recently not complaining of fatigue   Lab Results  Component Value Date   TESTOSTERONE 450.94 08/11/2018   Also takes Viagra for erectile dysfunction  LABS:  Lab on 08/12/2018  Component Date Value Ref Range Status  . Hgb A1c MFr Bld 08/12/2018 7.2* <5.7 % of total Hgb Final   Comment: For someone without known diabetes, a hemoglobin A1c value of 6.5% or greater indicates that they may have  diabetes and this should be confirmed with a follow-up  test. . For someone with known diabetes, a value <7% indicates  that their diabetes is well controlled and a value  greater than or equal to 7% indicates suboptimal  control. A1c targets should be individualized based on  duration of diabetes, age, comorbid conditions, and  other considerations. . Currently,  no consensus exists regarding use of hemoglobin A1c for diagnosis of diabetes for children. .   . Mean Plasma Glucose 08/12/2018 160  (calc) Final  . eAG (mmol/L) 08/12/2018 8.9  (calc) Final     Physical Examination:  BP (!) 148/86 (BP Location: Left Arm, Patient Position: Sitting, Cuff Size: Large)   Pulse  86   Ht '5\' 10"'  (1.778 m)   Wt 279 lb 12.8 oz (126.9 kg)   SpO2 98%   BMI 40.15 kg/m       1+ lower leg edema present  ASSESSMENT/PLAN:   Diabetes type 2, with obesity, BMI 39  See history of present illness for detailed discussion of his current management, blood sugar patterns and problems identified  His A1c is consistently below 7  He has not lost any weight and his fasting readings are variably high overall control is fairly low Discussed needing to check blood sugars more often especially after meals Since his insurance does not cover freestyle libre he can continue Accu-Chek Also discussed that using a Dexcom sensor is not necessary or cost effective with his not been insulin-dependent No change in medications Since he has recently started back on exercise hopefully will blood sugars will be consistent Discussed that he can modify his diet better if he can monitor readings after meals  HYPERTENSION:  His blood pressure is not as well controlled We will add 12.5 mg HCTZ with 20 mg lisinopril and Norvasc, will need to monitor potassium once this is started This may also help his mild edema Currently uric acid well controlled   LIPIDS: Adequately controlled with LDL 47  Hypogonadism: Adequately replaced with Androderm 8 mg and he will continue, reminded to change the patch at the same time every day  Total visit time for evaluation and management of multiple problems and counseling =25 minutes  There are no Patient Instructions on file for this visit.    Elayne Snare 08/18/2018, 3:51 PM

## 2018-09-22 ENCOUNTER — Ambulatory Visit: Payer: BLUE CROSS/BLUE SHIELD | Admitting: Family Medicine

## 2018-09-22 ENCOUNTER — Encounter: Payer: Self-pay | Admitting: Family Medicine

## 2018-09-22 VITALS — BP 116/78 | HR 86 | Temp 97.7°F | Wt 276.0 lb

## 2018-09-22 DIAGNOSIS — G4733 Obstructive sleep apnea (adult) (pediatric): Secondary | ICD-10-CM | POA: Diagnosis not present

## 2018-09-22 DIAGNOSIS — Z8739 Personal history of other diseases of the musculoskeletal system and connective tissue: Secondary | ICD-10-CM | POA: Diagnosis not present

## 2018-09-22 DIAGNOSIS — E119 Type 2 diabetes mellitus without complications: Secondary | ICD-10-CM

## 2018-09-22 DIAGNOSIS — I10 Essential (primary) hypertension: Secondary | ICD-10-CM

## 2018-09-22 DIAGNOSIS — D170 Benign lipomatous neoplasm of skin and subcutaneous tissue of head, face and neck: Secondary | ICD-10-CM

## 2018-09-22 NOTE — Progress Notes (Signed)
Subjective:    Patient ID: Clifford Kramer, male    DOB: [birth year], 53 y.o.   MRN: [id number]  No chief complaint on file.   HPI Patient was seen today for f/u on chronic conditions and TOC, previously seen by Dr. Sherren Mocha.  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  HTN: -taking lisinopril 20 mg, norvasc 5 mg, and HCTZ 12.5 mg  -states has been well controlled -trying to eat better/drink more water  OSA: -wears Cpap 80-90% of the time -feels well rested  DM II -followewed by Dr. Tanna Savoy -taking glimepiride 1 mg, Ozempic 1 mg injection weekly, and synjardy xr 12.11-998 mg (empagliflozin-metformin) -pt checking fsbs  Gout: -taking allopurinol 300 mg daily. -May have pain in hands, wrist, right elbow, thumbs or index finger -tries to avoid foods that cause problems -drinking more water   Pt is from [place], [place].  He served 8 yrs in the [employer].  He attended [employer].  Also seen at the [place].  Past Medical History:  Diagnosis Date  . Diabetes mellitus   . 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 Gout   . Hyperlipidemia   . Hypertension   . Sleep apnea    CPAP use     Allergies  Allergen Reactions  . Sulfa Antibiotics Itching    REACTION: itching  . Celebrex [Celecoxib] Itching    REACTION: itching  . Penicillins Itching  . Sulfonamide Derivatives Itching    REACTION: itching    ROS General: Denies fever, chills, night sweats, changes in weight, changes in appetite HEENT: Denies headaches, ear pain, changes in vision, rhinorrhea, sore throat CV: Denies CP, palpitations, SOB, orthopnea Pulm: Denies SOB, cough, wheezing GI: Denies abdominal pain, nausea, vomiting, diarrhea, constipation GU: Denies dysuria, hematuria, frequency, vaginal discharge Msk: Denies muscle cramps, joint pains Neuro: Denies weakness, numbness, tingling Skin: Denies rashes, bruising  +bump on head Psych: Denies depression, anxiety, hallucinations    Objective:    Blood pressure 116/78,  pulse 86, temperature 97.7 F (36.5 C), temperature source Oral, weight 276 lb (125.2 kg), SpO2 98 %.  Gen. Pleasant, well-nourished, in no distress, normal affect  HEENT: [place]/AT, face symmetric, no scleral icterus, PERRLA,  nares patent without drainage, pharynx without erythema or exudate. Lungs: no accessory muscle use, CTAB, no wheezes or rales Cardiovascular: RRR, no m/r/g, no peripheral edema Abdomen: BS present, soft, NT/ND. Musculoskeletal: No deformities, no cyanosis or clubbing, normal tone Neuro:  A&Ox3, CN II-XII intact, normal gait Skin:  Warm, no lesions/ rash.  Soft, mobile, mass on posterior head, right occipital area underneath pt's scalp.  No erythema, no fluctuance, or induration noted   Wt Readings from Last 3 Encounters:  09/22/18 276 lb (125.2 kg)  08/18/18 279 lb 12.8 oz (126.9 kg)  04/14/18 274 lb (124.3 kg)    Lab Results  Component Value Date   WBC 6.1 08/11/2018   HGB 14.3 08/11/2018   HCT 42.6 08/11/2018   PLT 234.0 08/11/2018   GLUCOSE 99 08/11/2018   CHOL 98 08/11/2018   TRIG 74.0 08/11/2018   HDL 36.30 (L) 08/11/2018   LDLCALC 47 08/11/2018   ALT 21 08/11/2018   AST 18 08/11/2018   NA 140 08/11/2018   K 3.9 08/11/2018   CL 108 08/11/2018   CREATININE 1.23 08/11/2018   BUN 12 08/11/2018   CO2 23 08/11/2018   TSH 2.99 10/01/2017   PSA 0.24 10/01/2017   HGBA1C 7.2 (H) 08/12/2018   MICROALBUR <0.7 08/11/2018  Assessment/Plan:  Essential hypertension -Controlled -Continue lisinopril 20 mg and hydrochlorothiazide 12.5 mg daily -Patient encouraged to increase physical activity and decrease sodium intake. -Encouraged to check BP at home -Given handout  History of gout -Continue allopurinol 300 mg daily -Continue to avoid foods known to cause symptoms and increase p.o. intake of water  Obstructive sleep apnea -Continue wearing CPAP nightly and when sleeping during the day  Controlled type 2 diabetes mellitus without complication,  without long-term current use of insulin (HCC) -Last hemoglobin A1c 7.2% on 08/12/2018 -Continue current medications including glimepiride 1 mg, Synjardy XR 12.11-998 mg, Ozempic 1 mg weekly -Continue checking fsbs -Continue follow-up with Dr. Dwyane Dee, endocrinology -Continue lifestyle modifications -Given handout  Lipoma of head -Given handout -Discussed removal options versus monitoring.  Patient wishes to monitor at this time.  Follow-up PRN  Grier Mitts, MD

## 2018-09-22 NOTE — Patient Instructions (Signed)
Lipoma  A lipoma is a noncancerous (benign) tumor that is made up of fat cells. This is a very common type of soft-tissue growth. Lipomas are usually found under the skin (subcutaneous). They may occur in any tissue of the body that contains fat. Common areas for lipomas to appear include the back, shoulders, buttocks, and thighs.  Lipomas grow slowly, and they are usually painless. Most lipomas do not cause problems and do not require treatment. What are the causes? The cause of this condition is not known. What increases the risk? You are more likely to develop this condition if:  You are 63-94 years old.  You have a family history of lipomas. What are the signs or symptoms? A lipoma usually appears as a small, round bump under the skin. In most cases, the lump will:  Feel soft or rubbery.  Not cause pain or other symptoms. However, if a lipoma is located in an area where it pushes on nerves, it can become painful or cause other symptoms. How is this diagnosed? A lipoma can usually be diagnosed with a physical exam. You may also have tests to confirm the diagnosis and to rule out other conditions. Tests may include:  Imaging tests, such as a CT scan or MRI.  Removal of a tissue sample to be looked at under a microscope (biopsy). How is this treated? Treatment for this condition depends on the size of the lipoma and whether it is causing any symptoms.  For small lipomas that are not causing problems, no treatment is needed.  If a lipoma is bigger or it causes problems, surgery may be done to remove the lipoma. Lipomas can also be removed to improve appearance. Most often, the procedure is done after applying a medicine that numbs the area (local anesthetic). Follow these instructions at home:  Watch your lipoma for any changes.  Keep all follow-up visits as told by your health care provider. This is important. Contact a health care provider if:  Your lipoma becomes larger or  hard.  Your lipoma becomes painful, red, or increasingly swollen. These could be signs of infection or a more serious condition. Get help right away if:  You develop tingling or numbness in an area near the lipoma. This could indicate that the lipoma is causing nerve damage. Summary  A lipoma is a noncancerous tumor that is made up of fat cells.  Most lipomas do not cause problems and do not require treatment.  If a lipoma is bigger or it causes problems, surgery may be done to remove the lipoma. This information is not intended to replace advice given to you by your health care provider. Make sure you discuss any questions you have with your health care provider. Document Released: 07/06/2002 Document Revised: 07/02/2017 Document Reviewed: 07/02/2017 Elsevier Interactive Patient Education  2019 [employer].  Managing Your Hypertension Hypertension is commonly called high blood pressure. This is when the force of your blood pressing against the walls of your arteries is too strong. Arteries are blood vessels that carry blood from your heart throughout your body. Hypertension forces the heart to work harder to pump blood, and may cause the arteries to become narrow or stiff. Having untreated or uncontrolled hypertension can cause heart attack, stroke, kidney disease, and other problems. What are blood pressure readings? A blood pressure reading consists of a higher number over a lower number. Ideally, your blood pressure should be below 120/80. The first ("top") number is called the systolic pressure. It  is a measure of the pressure in your arteries as your heart beats. The second ("bottom") number is called the diastolic pressure. It is a measure of the pressure in your arteries as the heart relaxes. What does my blood pressure reading mean? Blood pressure is classified into four stages. Based on your blood pressure reading, your health care provider may use the following stages to determine  what type of treatment you need, if any. Systolic pressure and diastolic pressure are measured in a unit called mm Hg. Normal  Systolic pressure: below 937.  Diastolic pressure: below 80. Elevated  Systolic pressure: 169-678.  Diastolic pressure: below 80. Hypertension stage 1  Systolic pressure: 938-101.  Diastolic pressure: 75-10. Hypertension stage 2  Systolic pressure: 258 or above.  Diastolic pressure: 90 or above. What health risks are associated with hypertension? Managing your hypertension is an important responsibility. Uncontrolled hypertension can lead to:  A heart attack.  A stroke.  A weakened blood vessel (aneurysm).  Heart failure.  Kidney damage.  Eye damage.  Metabolic syndrome.  Memory and concentration problems. What changes can I make to manage my hypertension? Hypertension can be managed by making lifestyle changes and possibly by taking medicines. Your health care provider will help you make a plan to bring your blood pressure within a normal range. Eating and drinking   Eat a diet that is high in fiber and potassium, and low in salt (sodium), added sugar, and fat. An example eating plan is called the DASH (Dietary Approaches to Stop Hypertension) diet. To eat this way: ? Eat plenty of fresh fruits and vegetables. Try to fill half of your plate at each meal with fruits and vegetables. ? Eat whole grains, such as whole wheat pasta, brown rice, or whole grain bread. Fill about one quarter of your plate with whole grains. ? Eat low-fat diary products. ? Avoid fatty cuts of meat, processed or cured meats, and poultry with skin. Fill about one quarter of your plate with lean proteins such as fish, chicken without skin, beans, eggs, and tofu. ? Avoid premade and processed foods. These tend to be higher in sodium, added sugar, and fat.  Reduce your daily sodium intake. Most people with hypertension should eat less than 1,500 mg of sodium a  day.  Limit alcohol intake to no more than 1 drink a day for nonpregnant women and 2 drinks a day for men. One drink equals 12 oz of beer, 5 oz of wine, or 1 oz of hard liquor. Lifestyle  Work with your health care provider to maintain a healthy body weight, or to lose weight. Ask what an ideal weight is for you.  Get at least 30 minutes of exercise that causes your heart to beat faster (aerobic exercise) most days of the week. Activities may include walking, swimming, or biking.  Include exercise to strengthen your muscles (resistance exercise), such as weight lifting, as part of your weekly exercise routine. Try to do these types of exercises for 30 minutes at least 3 days a week.  Do not use any products that contain nicotine or tobacco, such as cigarettes and e-cigarettes. If you need help quitting, ask your health care provider.  Control any long-term (chronic) conditions you have, such as high cholesterol or diabetes. Monitoring  Monitor your blood pressure at home as told by your health care provider. Your personal target blood pressure may vary depending on your medical conditions, your age, and other factors.  Have your blood pressure checked  regularly, as often as told by your health care provider. Working with your health care provider  Review all the medicines you take with your health care provider because there may be side effects or interactions.  Talk with your health care provider about your diet, exercise habits, and other lifestyle factors that may be contributing to hypertension.  Visit your health care provider regularly. Your health care provider can help you create and adjust your plan for managing hypertension. Will I need medicine to control my blood pressure? Your health care provider may prescribe medicine if lifestyle changes are not enough to get your blood pressure under control, and if:  Your systolic blood pressure is 130 or higher.  Your diastolic  blood pressure is 80 or higher. Take medicines only as told by your health care provider. Follow the directions carefully. Blood pressure medicines must be taken as prescribed. The medicine does not work as well when you skip doses. Skipping doses also puts you at risk for problems. Contact a health care provider if:  You think you are having a reaction to medicines you have taken.  You have repeated (recurrent) headaches.  You feel dizzy.  You have swelling in your ankles.  You have trouble with your vision. Get help right away if:  You develop a severe headache or confusion.  You have unusual weakness or numbness, or you feel faint.  You have severe pain in your chest or abdomen.  You vomit repeatedly.  You have trouble breathing. Summary  Hypertension is when the force of blood pumping through your arteries is too strong. If this condition is not controlled, it may put you at risk for serious complications.  Your personal target blood pressure may vary depending on your medical conditions, your age, and other factors. For most people, a normal blood pressure is less than 120/80.  Hypertension is managed by lifestyle changes, medicines, or both. Lifestyle changes include weight loss, eating a healthy, low-sodium diet, exercising more, and limiting alcohol. This information is not intended to replace advice given to you by your health care provider. Make sure you discuss any questions you have with your health care provider. Document Released: 04/09/2012 Document Revised: 06/13/2016 Document Reviewed: 06/13/2016 Elsevier Interactive Patient Education  2019 [employer]  Preventing Diabetes Mellitus Complications You can take action to prevent or slow down problems that are caused by diabetes (diabetes mellitus). Following your diabetes plan and taking care of yourself can reduce your risk of serious or life-threatening complications. What actions can I take to prevent diabetes  complications? Manage your diabetes   Follow instructions from your health care providers about managing your diabetes. Your diabetes may be managed by a team of health care providers who can teach you how to care for yourself and can answer questions that you have.  Educate yourself about your condition so you can make healthy choices about eating and physical activity.  Check your blood sugar (glucose) levels as often as directed. Your health care provider will help you decide how often to check your blood glucose level depending on your treatment goals and how well you are meeting them.  Ask your health care provider if you should take low-dose aspirin daily and what dose is recommended for you. Taking low-dose aspirin daily is recommended to help prevent cardiovascular disease. Do not use nicotine or tobacco Do not use any products that contain nicotine or tobacco, such as cigarettes and e-cigarettes. If you need help quitting, ask your health care  provider. Nicotine raises your risk for diabetes problems. If you quit using nicotine:  You will lower your risk for heart attack, stroke, nerve disease, and kidney disease.  Your cholesterol and blood pressure may improve.  Your blood circulation will improve. Keep your blood pressure under control Your personal target blood pressure is determined based on:  Your age.  Your medicines.  How long you have had diabetes.  Any other medical conditions you have. To control your blood pressure:  Follow instructions from your health care provider about meal planning, exercise, and medicines.  Make sure your health care provider checks your blood pressure at every medical visit.  Monitor your blood pressure at home as told by your health care provider.  Keep your cholesterol under control To control your cholesterol:  Follow instructions from your health care provider about meal planning, exercise, and medicines.  Have your  cholesterol checked at least once a year.  You may be prescribed medicine to lower cholesterol (statin). If you are not taking a statin, ask your health care provider if you should be. Controlling your cholesterol may:  Help prevent heart disease and stroke. These are the most common health problems for people with diabetes.  Improve your blood flow. Schedule and keep yearly physical exams and eye exams Your health care provider will tell you how often you need medical visits depending on your diabetes management plan. Keep all follow-up visits as directed. This is important so possible problems can be identified early and complications can be avoided or treated.  Every visit with your health care provider should include measuring your: ? Weight. ? Blood pressure. ? Blood glucose control.  Your A1c (hemoglobin A1c) level should be checked: ? At least 2 times a year, if you are meeting your treatment goals. ? 4 times a year, if you are not meeting treatment goals or if your treatment goals have changed.  Your blood lipids (lipid profile) should be checked yearly. You should also be checked yearly for protein in your urine (urine microalbumin).  If you have type 1 diabetes, get an eye exam 3-5 years after you are diagnosed, and then once a year after your first exam.  If you have type 2 diabetes, get an eye exam as soon as you are diagnosed, and then once a year after your first exam. Keep your vaccines current It is recommended that you receive:  A flu (influenza) vaccine every year.  A pneumonia (pneumococcal) vaccine and a hepatitis B vaccine. If you are age 67 or older, you may get the pneumonia vaccine as a series of two separate shots. Ask your health care provider which other vaccines may be recommended. Take care of your feet Diabetes may cause you to have poor blood circulation to your legs and feet. Because of this, taking care of your feet is very important. Diabetes can  cause:  The skin on the feet to get thinner, break more easily, and heal more slowly.  Nerve damage in your legs and feet, which results in decreased feeling. You may not notice minor injuries that could lead to serious problems. To avoid foot problems:  Check your skin and feet every day for cuts, bruises, redness, blisters, or sores.  Schedule a foot exam with your health care provider once every year. This exam includes: ? Inspecting of the structure and skin of your feet. ? Checking the pulses and sensation in your feet.  Make sure that your health care provider performs a visual  foot exam at every medical visit.  Take care of your teeth People with poorly controlled diabetes are more likely to have gum (periodontal) disease. Diabetes can make periodontal diseases harder to control. If not treated, periodontal diseases can lead to tooth loss. To prevent this:  Brush your teeth twice a day.  Floss at least once a day.  Visit your dentist 2 times a year. Drink responsibly Limit alcohol intake to no more than 1 drink a day for nonpregnant women and 2 drinks a day for men. One drink equals 12 oz of beer, 5 oz of wine, or 1 oz of hard liquor.  It is important to eat food when you drink alcohol to avoid low blood glucose (hypoglycemia). Avoid alcohol if you:  Have a history of alcohol abuse or dependence.  Are pregnant.  Have liver disease, pancreatitis, advanced neuropathy, or severe hypertriglyceridemia. Lessen stress Living with diabetes can be stressful. When you are experiencing stress, your blood glucose may be affected in two ways:  Stress hormones may cause your blood glucose to rise.  You may be distracted from taking good care of yourself. Be aware of your stress level and make changes to help you manage challenging situations. To lower your stress levels:  Consider joining a support group.  Do planned relaxation or meditation.  Do a hobby that you  enjoy.  Maintain healthy relationships.  Exercise regularly.  Work with your health care provider or a mental health professional. Summary  You can take action to prevent or slow down problems that are caused by diabetes (diabetes mellitus). Following your diabetes plan and taking care of yourself can reduce your risk of serious or life-threatening complications.  Follow instructions from your health care providers about managing your diabetes. Your diabetes may be managed by a team of health care providers who can teach you how to care for yourself and can answer questions that you have.  Your health care provider will tell you how often you need medical visits depending on your diabetes management plan. Keep all follow-up visits as directed. This is important so possible problems can be identified early and complications can be avoided or treated. This information is not intended to replace advice given to you by your health care provider. Make sure you discuss any questions you have with your health care provider. Document Released: 04/03/2011 Document Revised: 03/05/2017 Document Reviewed: 04/14/2016 Elsevier Interactive Patient Education  2019 [employer].

## 2018-09-26 ENCOUNTER — Encounter: Payer: Self-pay | Admitting: Family Medicine

## 2018-09-26 DIAGNOSIS — D170 Benign lipomatous neoplasm of skin and subcutaneous tissue of head, face and neck: Secondary | ICD-10-CM | POA: Insufficient documentation

## 2018-10-27 ENCOUNTER — Encounter: Payer: BLUE CROSS/BLUE SHIELD | Admitting: Family Medicine

## 2018-10-29 ENCOUNTER — Other Ambulatory Visit: Payer: Self-pay

## 2018-10-29 ENCOUNTER — Encounter: Payer: Self-pay | Admitting: Family Medicine

## 2018-10-29 MED ORDER — ALLOPURINOL 300 MG PO TABS: 300.0000 mg | ORAL_TABLET | Freq: Every day | ORAL | 1 refills | Status: DC

## 2018-11-03 ENCOUNTER — Other Ambulatory Visit: Payer: Self-pay | Admitting: Endocrinology

## 2018-11-03 MED ORDER — LISINOPRIL-HYDROCHLOROTHIAZIDE 20-12.5 MG PO TABS: 1.0000 | ORAL_TABLET | Freq: Every day | ORAL | 3 refills | Status: DC

## 2018-11-10 ENCOUNTER — Other Ambulatory Visit (INDEPENDENT_AMBULATORY_CARE_PROVIDER_SITE_OTHER): Payer: BLUE CROSS/BLUE SHIELD

## 2018-11-10 DIAGNOSIS — E1165 Type 2 diabetes mellitus with hyperglycemia: Secondary | ICD-10-CM

## 2018-11-10 LAB — BASIC METABOLIC PANEL
BUN: 16 mg/dL (ref 6–23)
CO2: 25 mEq/L (ref 19–32)
Calcium: 9.2 mg/dL (ref 8.4–10.5)
Chloride: 103 mEq/L (ref 96–112)
Creatinine, Ser: 1.34 mg/dL (ref 0.40–1.50)
GFR: 67.02 mL/min (ref >60.00)
Glucose, Bld: 107 mg/dL — ABNORMAL HIGH (ref 70–99)
Potassium: 3.9 mEq/L (ref 3.5–5.1)
Sodium: 138 mEq/L (ref 135–145)

## 2018-11-10 LAB — HEMOGLOBIN A1C: Hgb A1c MFr Bld: 7.5 % — ABNORMAL HIGH (ref 4.6–6.5)

## 2018-11-11 ENCOUNTER — Other Ambulatory Visit: Payer: Self-pay | Admitting: Endocrinology

## 2018-11-17 ENCOUNTER — Ambulatory Visit (INDEPENDENT_AMBULATORY_CARE_PROVIDER_SITE_OTHER): Payer: BLUE CROSS/BLUE SHIELD | Admitting: Endocrinology

## 2018-11-17 ENCOUNTER — Other Ambulatory Visit: Payer: Self-pay

## 2018-11-17 ENCOUNTER — Encounter: Payer: Self-pay | Admitting: Endocrinology

## 2018-11-17 DIAGNOSIS — E291 Testicular hypofunction: Secondary | ICD-10-CM | POA: Diagnosis not present

## 2018-11-17 DIAGNOSIS — E1165 Type 2 diabetes mellitus with hyperglycemia: Secondary | ICD-10-CM | POA: Diagnosis not present

## 2018-11-17 DIAGNOSIS — E790 Hyperuricemia without signs of inflammatory arthritis and tophaceous disease: Secondary | ICD-10-CM

## 2018-11-17 NOTE — Progress Notes (Signed)
Patient ID: Clifford Kramer, male   DOB: 23-Apr-1966, 53 y.o.   MRN: [id number]   Today's office visit was provided via telemedicine using video technique Explained to the patient and the the limitations of evaluation and management by telemedicine and the availability of in person appointments.  The patient understood the limitations and agreed to proceed. Patient also understood that the telehealth visit is billable. . Location of the patient: Home . Location of the provider: Office Only the patient and myself were participating in the encounter    Reason for Appointment : Endocrinology follow-up  History of Present Illness          Diagnosis: Type 2 diabetes mellitus, date of diagnosis: 2005     Background information: He was probably started on metformin at the time of diagnosis when his blood sugars were not very high About 5-6 years ago because of higher blood sugars he was also given glipizide  He appeared to have  progression of his diabetes with A1c going up to 8.1% in 01/2013; also had difficulty with losing weight before he was started on Victoza.  With this his blood sugars were significantly better His glipizide was reduced to 2.5 mg in 10/14 because of rare hypoglycemia He has taken 1.8 mg of Victoza since 7/15   RECENT history:   Antihyperglycemic drugs : Ozempic 1 mg weekly, and Amaryl 1 mg pcs, Synjardy XR 12.11/998, 2 tablets daily  His A1c is about the highest today while at 7.5, has been as low as 6.4  Work routine: He is as before starting work at [employer] AM  Current management, blood sugar patterns and problems:  He has again been checking his blood sugars very infrequently and has only 4 readings in the last 3 weeks.  Also not clear if his strips are accurate or expired  Because of not being able to go to the gym he has no exercise until recently when he started walking  He thinks he is compliant with all his medications including Ozempic and  Synjardy  Takes Amaryl right after eating his dinner  However a few weeks ago he had checked his blood sugar after dinner and it was 215  Recently has only 1 reading after lunch which was 147 and his lab glucose after lunch was 109  He has 1 reading of 152 fasting   Side effects from medications have been: None       Monitors blood glucose: once a day          Glucometer:      Accu-Chek  Blood Glucose readings by history as above   LIFESTYLE: Meals: 3 meals per day. Dinner 5 pm, Bfst 9 am; lunch 2 pm;  eating out periodically  His breakfast:  eggs and toast or meat  Usually eating fruit and sandwich at lunch, usually a grilled meat at suppertime   Physical activity: exercise: 0-5/7 days a week, at gym or walking, 45 min      Dietician visit: 05/2013           Wt Readings from Last 3 Encounters:  09/22/18 276 lb (125.2 kg)  08/18/18 279 lb 12.8 oz (126.9 kg)  04/14/18 274 lb (124.3 kg)   Lab Results  Component Value Date   HGBA1C 7.5 (H) 11/10/2018   HGBA1C 7.2 (H) 08/12/2018   HGBA1C 6.8 (H) 08/11/2018   Lab Results  Component Value Date   MICROALBUR <0.7 08/11/2018   LDLCALC 47 08/11/2018  CREATININE 1.34 11/10/2018    Other active problems including new problems are in review of systems    No visits with results within 1 Week(s) from this visit.  Latest known visit with results is:  Lab on 11/10/2018  Component Date Value Ref Range Status  . Sodium 11/10/2018 138  135 - 145 mEq/L Final  . Potassium 11/10/2018 3.9  3.5 - 5.1 mEq/L Final  . Chloride 11/10/2018 103  96 - 112 mEq/L Final  . CO2 11/10/2018 25  19 - 32 mEq/L Final  . Glucose, Bld 11/10/2018 107* 70 - 99 mg/dL Final  . BUN 11/10/2018 16  6 - 23 mg/dL Final  . Creatinine, Ser 11/10/2018 1.34  0.40 - 1.50 mg/dL Final  . Calcium 11/10/2018 9.2  8.4 - 10.5 mg/dL Final  . GFR 11/10/2018 67.02  >60.00 mL/min Final  . Hgb A1c MFr Bld 11/10/2018 7.5* 4.6 - 6.5 % Final   Glycemic Control Guidelines for  People with Diabetes:Non Diabetic:  <6%Goal of Therapy: <7%Additional Action Suggested:  >8%     Allergies as of 11/17/2018      Reactions   Sulfa Antibiotics Itching   REACTION: itching   Celebrex [celecoxib] Itching   REACTION: itching   Penicillins Itching   Sulfonamide Derivatives Itching   REACTION: itching      Medication List       Accurate as of November 17, 2018  3:36 PM. Always use your most recent med list.        allopurinol 300 MG tablet Commonly known as:  ZYLOPRIM Take 1 tablet (300 mg total) by mouth daily.   amLODipine 5 MG tablet Commonly known as:  NORVASC Take 1 tablet (5 mg total) by mouth daily.   Androderm 4 MG/24HR Pt24 patch Generic drug:  testosterone PLACE 2 PATCHES ONTO THE SKIN DAILY   aspirin 81 MG tablet Take 81 mg by mouth daily.   BD Pen Needle Nano U/F 32G X 4 MM Misc Generic drug:  Insulin Pen Needle USE EVERY DAY   fluticasone 50 MCG/ACT nasal spray Commonly known as:  FLONASE PLACE 2 SPRAYS INTO THE NOSE DAILY.   glimepiride 1 MG tablet Commonly known as:  AMARYL TAKE 1 TABLET (1 MG TOTAL) BY MOUTH DAILY WITH BREAKFAST.   glucose blood test strip Commonly known as:  Accu-Chek Guide Use as instructed   ONE TOUCH ULTRA TEST test strip Generic drug:  glucose blood USE AS INSTRUCTED TO CHECK BLOOD SUGAR TWO TIMES A DAY DX CODE E11.9   hydrochlorothiazide 12.5 MG capsule Commonly known as:  MICROZIDE TAKE 1 CAPSULE BY MOUTH EVERY DAY   indomethacin 50 MG capsule Commonly known as:  INDOCIN Take 50 mg by mouth 2 (two) times daily with a meal.   lisinopril 20 MG tablet Commonly known as:  ZESTRIL TAKE 1 TABLET BY MOUTH EVERY DAY   lisinopril-hydrochlorothiazide 20-12.5 MG tablet Commonly known as:  Zestoretic Take 1 tablet by mouth daily.   omeprazole 40 MG capsule Commonly known as:  PRILOSEC TAKE 1 CAPSULE BY MOUTH EVERY DAY   OneTouch Delica Lancets Fine Misc Use to check blood sugar 2 times per day, dx code  E11.9   Accu-Chek FastClix Lancets Misc Use to check blood sugar 3 times daily. Dx Code E11.9   OneTouch Verio IQ System w/Device Kit Use to check blood sugar 2 times daily   Ozempic (1 MG/DOSE) 2 MG/1.5ML Sopn Generic drug:  Semaglutide (1 MG/DOSE) INJECT 1 MG INTO THE SKIN ONCE  A WEEK.   scopolamine 1 MG/3DAYS Commonly known as:  Transderm-Scop (1.5 MG) Place 1 patch (1.5 mg total) onto the skin every 3 (three) days.   simvastatin 20 MG tablet Commonly known as:  ZOCOR TAKE 1 TABLET AT BEDTIME   Synjardy XR 12.11-998 MG Tb24 Generic drug:  Empagliflozin-metFORMIN HCl ER TAKE 2 TABLETS BY MOUTH DAILY WITH BREAKFAST.   Viagra 100 MG tablet Generic drug:  sildenafil       Allergies:  Allergies  Allergen Reactions  . Sulfa Antibiotics Itching    REACTION: itching  . Celebrex [Celecoxib] Itching    REACTION: itching  . Penicillins Itching  . Sulfonamide Derivatives Itching    REACTION: itching    Past Medical History:  Diagnosis Date  . Diabetes mellitus   . 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 Gout   . Hyperlipidemia   . Hypertension   . Sleep apnea    CPAP use     Past Surgical History:  Procedure Laterality Date  . COLONOSCOPY  2015  . KNEE SURGERY Left 2005    Family History  Problem Relation Age of Onset  . Diabetes Mother   . Stomach cancer Other   . Diabetes Maternal Aunt   . Diabetes Maternal Grandmother   . Heart disease Neg Hx   . Colon cancer Neg Hx   . Esophageal cancer Neg Hx   . Rectal cancer Neg Hx     Social History:  reports that he has never smoked. He has never used smokeless tobacco. He reports previous alcohol use. He reports previous drug use.    Review of Systems    HYPERTENSION: This is well controlled with Norvasc 5 mg and  20 mg lisinopril HCTZ was stopped with using SGLT 2 drugs and because of hyperuricemia However his home blood pressure was higher and this was started back  Home readings have been before  medication as high as 129/87 but as low as 116/72 otherwise Most of his diastolic numbers are in the upper 70s and low 80s and better than before  BP Readings from Last 3 Encounters:  09/22/18 116/78  08/18/18 (!) 148/86  04/14/18 128/66   Creatinine slightly variable:  Lab Results  Component Value Date   CREATININE 1.34 11/10/2018   CREATININE 1.23 08/11/2018   CREATININE 1.37 04/07/2018     GOUT: He has had history of recurrent episodes of probable gout in various joints including knee and elbow in the past Baseline uric acid was 8.4 done in 2012   Taking allopurinol 300 mg daily regularly with last uric acid 3.3      Hyperlipidemia: LDL has been controlled, baseline previously 109, has been taking simvastatin 20 mg. Has a low HDL  Lab Results  Component Value Date   CHOL 98 08/11/2018   HDL 36.30 (L) 08/11/2018   LDLCALC 47 08/11/2018   TRIG 74.0 08/11/2018   CHOLHDL 3 08/11/2018       HYPOGONADISM:  He has been on testosterone supplements since 2011 when his level was mildly low around 300  but no further evaluation done.   He has a relatively low testosterone level while using Axiron and had difficulty with the application With Androderm patches he had improved testosterone levels and less fatigue  His testosterone level has been consistent with using 8 mg Androderm in the normal range Feels fairly good recently    Lab Results  Component Value Date   TESTOSTERONE 450.94 08/11/2018   Also takes Viagra  for erectile dysfunction  LABS:  No visits with results within 1 Week(s) from this visit.  Latest known visit with results is:  Lab on 11/10/2018  Component Date Value Ref Range Status  . Sodium 11/10/2018 138  135 - 145 mEq/L Final  . Potassium 11/10/2018 3.9  3.5 - 5.1 mEq/L Final  . Chloride 11/10/2018 103  96 - 112 mEq/L Final  . CO2 11/10/2018 25  19 - 32 mEq/L Final  . Glucose, Bld 11/10/2018 107* 70 - 99 mg/dL Final  . BUN 11/10/2018 16  6 - 23  mg/dL Final  . Creatinine, Ser 11/10/2018 1.34  0.40 - 1.50 mg/dL Final  . Calcium 11/10/2018 9.2  8.4 - 10.5 mg/dL Final  . GFR 11/10/2018 67.02  >60.00 mL/min Final  . Hgb A1c MFr Bld 11/10/2018 7.5* 4.6 - 6.5 % Final   Glycemic Control Guidelines for People with Diabetes:Non Diabetic:  <6%Goal of Therapy: <7%Additional Action Suggested:  >8%      Physical Examination:  There were no vitals taken for this visit.        ASSESSMENT/PLAN:   Diabetes type 2, with obesity, BMI 39  See history of present illness for detailed discussion of his current management, blood sugar patterns and problems identified  His A1c is at the highest level of 7.5 for him  This is likely to be from inconsistent diet and exercise He has minimal glucose monitoring at home again Discussed making sure he has an expired test strips and check readings especially after his evening meal He will continue to increase his walking which was just started and be consistent with diet  HYPERTENSION:  His blood pressure is improved with HCTZ No recurrence of gout or hypokalemia and he will continue  Uric acid to be checked on the next visit, meanwhile continue allopurinol    Hypogonadism: Previously therapeutic levels   Currently Androderm 8 mg and he will continue.  Needs labs on next visit    There are no Patient Instructions on file for this visit.    Elayne Snare 11/17/2018, 3:36 PM

## 2018-11-28 ENCOUNTER — Other Ambulatory Visit: Payer: Self-pay

## 2018-11-28 ENCOUNTER — Telehealth: Payer: Self-pay | Admitting: Family Medicine

## 2018-11-28 MED ORDER — AMLODIPINE BESYLATE 5 MG PO TABS: 5.0000 mg | ORAL_TABLET | Freq: Every day | ORAL | 0 refills | Status: DC

## 2018-11-28 NOTE — Telephone Encounter (Signed)
Rx sent to pt pharmacy 

## 2018-11-28 NOTE — Telephone Encounter (Signed)
Copied from [place] [id number]. Topic: Quick Communication - Rx Refill/Question >> Nov 28, 2018  1:31 PM Scherrie Gerlach wrote: Medication: amLODipine (NORVASC) 5 MG tablet 90 day Pt used to see Dr Sherren Mocha and Dr Volanda Napoleon has never written this Rx for the pt.  [id number] - [place], [place] [phone] (Phone) [phone] (Fax)

## 2019-01-28 ENCOUNTER — Ambulatory Visit (INDEPENDENT_AMBULATORY_CARE_PROVIDER_SITE_OTHER): Payer: BC Managed Care – PPO | Admitting: Family Medicine

## 2019-01-28 ENCOUNTER — Encounter: Payer: Self-pay | Admitting: Family Medicine

## 2019-01-28 ENCOUNTER — Other Ambulatory Visit: Payer: Self-pay

## 2019-01-28 ENCOUNTER — Ambulatory Visit (INDEPENDENT_AMBULATORY_CARE_PROVIDER_SITE_OTHER): Payer: BC Managed Care – PPO

## 2019-01-28 VITALS — BP 110/64 | HR 94 | Temp 97.7°F | Wt 266.0 lb

## 2019-01-28 DIAGNOSIS — M25511 Pain in right shoulder: Secondary | ICD-10-CM | POA: Diagnosis not present

## 2019-01-28 DIAGNOSIS — Z125 Encounter for screening for malignant neoplasm of prostate: Secondary | ICD-10-CM

## 2019-01-28 DIAGNOSIS — M7022 Olecranon bursitis, left elbow: Secondary | ICD-10-CM | POA: Diagnosis not present

## 2019-01-28 DIAGNOSIS — M21612 Bunion of left foot: Secondary | ICD-10-CM

## 2019-01-28 DIAGNOSIS — B351 Tinea unguium: Secondary | ICD-10-CM | POA: Diagnosis not present

## 2019-01-28 DIAGNOSIS — Z0000 Encounter for general adult medical examination without abnormal findings: Secondary | ICD-10-CM

## 2019-01-28 DIAGNOSIS — Z13220 Encounter for screening for lipoid disorders: Secondary | ICD-10-CM

## 2019-01-28 DIAGNOSIS — I10 Essential (primary) hypertension: Secondary | ICD-10-CM

## 2019-01-28 DIAGNOSIS — Z8739 Personal history of other diseases of the musculoskeletal system and connective tissue: Secondary | ICD-10-CM

## 2019-01-28 NOTE — Patient Instructions (Signed)
Preventive Care 40-55 Years Old, Male Preventive care refers to lifestyle choices and visits with your health care provider that can promote health and wellness. This includes:  A yearly physical exam. This is also called an annual well check.  Regular dental and eye exams.  Immunizations.  Screening for certain conditions.  Healthy lifestyle choices, such as eating a healthy diet, getting regular exercise, not using drugs or products that contain nicotine and tobacco, and limiting alcohol use. What can I expect for my preventive care visit? Physical exam Your health care provider will check:  Height and weight. These may be used to calculate body mass index (BMI), which is a measurement that tells if you are at a healthy weight.  Heart rate and blood pressure.  Your skin for abnormal spots. Counseling Your health care provider may ask you questions about:  Alcohol, tobacco, and drug use.  Emotional well-being.  Home and relationship well-being.  Sexual activity.  Eating habits.  Work and work environment. What immunizations do I need?  Influenza (flu) vaccine  This is recommended every year. Tetanus, diphtheria, and pertussis (Tdap) vaccine  You may need a Td booster every 10 years. Varicella (chickenpox) vaccine  You may need this vaccine if you have not already been vaccinated. Zoster (shingles) vaccine  You may need this after age 60. Measles, mumps, and rubella (MMR) vaccine  You may need at least one dose of MMR if you were born in 1957 or later. You may also need a second dose. Pneumococcal conjugate (PCV13) vaccine  You may need this if you have certain conditions and were not previously vaccinated. Pneumococcal polysaccharide (PPSV23) vaccine  You may need one or two doses if you smoke cigarettes or if you have certain conditions. Meningococcal conjugate (MenACWY) vaccine  You may need this if you have certain conditions. Hepatitis A vaccine   You may need this if you have certain conditions or if you travel or work in places where you may be exposed to hepatitis A. Hepatitis B vaccine  You may need this if you have certain conditions or if you travel or work in places where you may be exposed to hepatitis B. Haemophilus influenzae type b (Hib) vaccine  You may need this if you have certain risk factors. Human papillomavirus (HPV) vaccine  If recommended by your health care provider, you may need three doses over 6 months. You may receive vaccines as individual doses or as more than one vaccine together in one shot (combination vaccines). Talk with your health care provider about the risks and benefits of combination vaccines. What tests do I need? Blood tests  Lipid and cholesterol levels. These may be checked every 5 years, or more frequently if you are over 50 years old.  Hepatitis C test.  Hepatitis B test. Screening  Lung cancer screening. You may have this screening every year starting at age 55 if you have a 30-pack-year history of smoking and currently smoke or have quit within the past 15 years.  Prostate cancer screening. Recommendations will vary depending on your family history and other risks.  Colorectal cancer screening. All adults should have this screening starting at age 50 and continuing until age 75. Your health care provider may recommend screening at age 45 if you are at increased risk. You will have tests every 1-10 years, depending on your results and the type of screening test.  Diabetes screening. This is done by checking your blood sugar (glucose) after you have not eaten   for a while (fasting). You may have this done every 1-3 years.  Sexually transmitted disease (STD) testing. Follow these instructions at home: Eating and drinking  Eat a diet that includes fresh fruits and vegetables, whole grains, lean protein, and low-fat dairy products.  Take vitamin and mineral supplements as recommended  by your health care provider.  Do not drink alcohol if your health care provider tells you not to drink.  If you drink alcohol: ? Limit how much you have to 0-2 drinks a day. ? Be aware of how much alcohol is in your drink. In the [place] one drink equals one 12 oz bottle of beer (355 mL), one 5 oz glass of wine (148 mL), or one 1 oz glass of hard liquor (44 mL). Lifestyle  Take daily care of your teeth and gums.  Stay active. Exercise for at least 30 minutes on 5 or more days each week.  Do not use any products that contain nicotine or tobacco, such as cigarettes, e-cigarettes, and chewing tobacco. If you need help quitting, ask your health care provider.  If you are sexually active, practice safe sex. Use a condom or other form of protection to prevent STIs (sexually transmitted infections).  Talk with your health care provider about taking a low-dose aspirin every day starting at age 82. What's next?  Go to your health care provider once a year for a well check visit.  Ask your health care provider how often you should have your eyes and teeth checked.  Stay up to date on all vaccines. This information is not intended to replace advice given to you by your health care provider. Make sure you discuss any questions you have with your health care provider. Document Released: 08/12/2015 Document Revised: 07/10/2018 Document Reviewed: 07/10/2018 Elsevier Patient Education  [place].  Shoulder Pain Many things can cause shoulder pain, including:  An injury.  Moving the shoulder in the same way again and again (overuse).  Joint pain (arthritis). Pain can come from:  Swelling and irritation (inflammation) of any part of the shoulder.  An injury to the shoulder joint.  An injury to: ? Tissues that connect muscle to bone (tendons). ? Tissues that connect bones to each other (ligaments). ? Bones. Follow these instructions at home: Watch for changes in your symptoms.  Let your doctor know about them. Follow these instructions to help with your pain. If you have a sling:  Wear the sling as told by your doctor. Remove it only as told by your doctor.  Loosen the sling if your fingers: ? Tingle. ? Become numb. ? Turn cold and blue.  Keep the sling clean.  If the sling is not waterproof: ? Do not let it get wet. ? Take the sling off when you shower or bathe. Managing pain, stiffness, and swelling   If told, put ice on the painful area: ? Put ice in a plastic bag. ? Place a towel between your skin and the bag. ? Leave the ice on for 20 minutes, 2-3 times a day. Stop putting ice on if it does not help with the pain.  Squeeze a soft ball or a foam pad as much as possible. This prevents swelling in the shoulder. It also helps to strengthen the arm. General instructions  Take over-the-counter and prescription medicines only as told by your doctor.  Keep all follow-up visits as told by your doctor. This is important. Contact a doctor if:  Your pain gets worse.  Medicine does not help your pain.  You have new pain in your arm, hand, or fingers. Get help right away if:  Your arm, hand, or fingers: ? Tingle. ? Are numb. ? Are swollen. ? Are painful. ? Turn white or blue. Summary  Shoulder pain can be caused by many things. These include injury, moving the shoulder in the same away again and again, and joint pain.  Watch for changes in your symptoms. Let your doctor know about them.  This condition may be treated with a sling, ice, and pain medicine.  Contact your doctor if the pain gets worse or you have new pain. Get help right away if your arm, hand, or fingers tingle or get numb, swollen, or painful.  Keep all follow-up visits as told by your doctor. This is important. This information is not intended to replace advice given to you by your health care provider. Make sure you discuss any questions you have with your health care provider.  Document Released: 01/02/2008 Document Revised: 01/28/2018 Document Reviewed: 01/28/2018 Elsevier Patient Education  [place].  Elbow Bursitis Bursitis is swelling and pain at the tip of your elbow. This happens when fluid builds up in a sac under your skin (bursa). This may also be called olecranon bursitis. Follow these instructions at home: Medicines  Take over-the-counter and prescription medicines only as told by your doctor.  If you were prescribed an antibiotic, take it exactly as told by your doctor. Do not stop taking it even if you start to feel better. Managing pain, stiffness, and swelling   If told, put ice on your elbow: ? Put ice in a plastic bag. ? Place a towel between your skin and the bag. ? Leave the ice on for 20 minutes, 2-3 times a day.  If your bursitis is caused by an injury, follow instructions from your doctor about: ? Resting your elbow. ? Wearing a bandage.  Wear elbow pads or elbow wraps as needed. These help cushion your elbow. General instructions  Avoid any activities that cause elbow pain. Ask your doctor what activities are safe for you.  Keep all follow-up visits as told by your doctor. This is important. Contact a doctor if you have:  A fever.  Problems that do not get better with treatment.  Pain or swelling that: ? Gets worse. ? Goes away and then comes back.  Pus draining from your elbow. Get help right away if you have:  Trouble moving your arm, hand, or fingers. Summary  Bursitis is swelling and pain at the tip of the elbow.  You may need to take medicine or put ice on your elbow.  Contact your doctor if your problems do not get better with treatment. This information is not intended to replace advice given to you by your health care provider. Make sure you discuss any questions you have with your health care provider. Document Released: 01/03/2010 Document Revised: 06/28/2017 Document Reviewed: 06/25/2017  Elsevier Patient Education  2020 [place].  Elbow Bursitis Rehab Ask your health care provider which exercises are safe for you. Do exercises exactly as told by your health care provider and adjust them as directed. It is normal to feel mild stretching, pulling, tightness, or discomfort as you do these exercises. Stop right away if you feel sudden pain or your pain gets worse. Do not begin these exercises until told by your health care provider. Stretching and range-of-motion exercises These exercises warm up your muscles and joints and  improve the movement and flexibility of your elbow. The exercises also help to relieve pain and swelling. Elbow flexion, assisted 1. Stand or sit with your left / right arm at your side. 2. Use your other hand to gently push your left / right hand toward your shoulder (assisted) while bending your elbow (flexion). 3. Hold this position for __________ seconds. 4. Slowly return your left / right arm to the starting position. Repeat __________ times. Complete this exercise __________ times a day. Elbow extension, assisted 1. Lie on your back in a comfortable position that allows you to relax your arm muscles. 2. Place a folded towel under your left / right upper arm so that your elbow and shoulder are at the same height. 3. Use your other arm to raise your left / right arm (assisted) until your elbow and hand do not rest on the bed or towel. Hold your left / right arm out straight with your other hand supporting it. 4. Let the weight of your hand straighten your elbow (extension). You should feel a stretch on the inside of your elbow. ? Keep your arm and chest muscles relaxed. ? If directed, add a small wrist weight or hand weight to increase the stretch. 5. Hold this position for __________ seconds. 6. Slowly release the stretch and return to the starting position. Repeat __________ times. Complete this exercise __________ times a day. Elbow flexion, active  1. Stand or sit with your left / right elbow bent and your palm facing in, toward your body. 2. Bend your elbow as far as you can using only your arm muscles (active flexion). 3. Hold this position for __________ seconds. 4. Slowly return to the starting position. Repeat __________ times. Complete this exercise __________ times a day. Elbow extension, active 1. Stand or sit with your left / right elbow bent and your palm facing in, toward your body. 2. Slowly straighten your elbow using only your arm muscles (active extension). Stop when you feel a gentle stretch at the front of your arm, or when your arm is straight. 3. Hold this position for __________ seconds. 4. Slowly return to the starting position. Repeat __________ times. Complete this exercise __________ times a day. Strengthening exercises These exercises build strength and endurance in your elbow. Endurance is the ability to use your muscles for a long time, even after they get tired. Elbow flexion, isometric 1. Stand or sit with your left / right arm at waist height. Your palm should face in, toward your body. 2. Place your other hand on top of your left / right forearm. Gently push down while you resist with your left / right arm (isometric flexion). ? Use about 50% effort with both arms. You may be instructed to use more and more effort with your arms each week. ? Try not to let your left / right arm move during the exercise. 3. Hold this position for __________ seconds. 4. Let your muscles relax completely before you repeat the exercise. Repeat __________ times. Complete this exercise __________ times a day. Elbow extension, isometric  1. Stand or sit with your left / right arm at waist height. Your palm should face in, toward your body. 2. Place your other hand on the bottom of your left / right forearm. Gently push up while you resist with your left / right arm (isometric extension). ? Use about 50% effort with both arms.  You may be instructed to use more and more effort with your arms each  week. ? Try not to let your left / right arm move during the exercise. 3. Hold this position for __________ seconds. 4. Let your muscles relax completely before you repeat the exercise. Repeat __________ times. Complete this exercise __________ times a day. Biceps curls 1. Sit on a stable chair without armrests, or stand up. 2. Hold a _________ weight in your left / right hand. Your palm should face out, away from your body, at the starting position. 3. Bend your left / right elbow and move your hand up toward your shoulder. Keep your elbow at your side while you bend it. 4. Slowly return to the starting position. Repeat __________ times. Complete this exercise __________ times a day. Triceps curls  1. Lie on your back. 2. Hold a _________ weight in your left / right hand. 3. Bend your left / right elbow to a 90-degree angle (right angle), so the weight is in front of your face, over your chest, and your elbow is pointed up to the ceiling. 4. Straighten your elbow, raising your hand toward the ceiling. Use your other hand to support your left / right upper arm and to keep it still. 5. Slowly return to the starting position. Repeat __________ times. Complete this exercise __________ times a day. This information is not intended to replace advice given to you by your health care provider. Make sure you discuss any questions you have with your health care provider. Document Released: 07/16/2005 Document Revised: 11/06/2018 Document Reviewed: 08/06/2018 Elsevier Patient Education  2020 [employer].

## 2019-01-28 NOTE — Progress Notes (Signed)
Subjective:     Clifford Kramer is a 53 y.o. male and is here for a comprehensive physical exam. The patient reports problems - R shouler pain, L elbow pain, L bunion.  Pt also seen by the [place].  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  Indomethacin d/c'd.  R shoulder pain: -occurs off and on -latest episode started a wk or so ago -can't get R arm over head. -feels pain/pulling in posterior shoulder and anterior lateral shoulder  L elbow pain: -notes pain deep in joint -developed a fluid filled sac on end of elbow, improving -pt stopped doing bicep curls 2/2 the discomfort. -has a h/o gout.  On allopurinol.  Indomethacin d/c'd   L Foot concern: -pt with h/o bunion -denies pain -does note occasional numbness of L medial foot at bunion -has never seen podiatrist.  HTN: -states bp doing well -taking lisinopril-hctz   DM II: -taking glimeperide 1 mg, synjardy xr and ozempic. -checking fsbs -followed by Endo, Dr. Dwyane Dee  Social History   Socioeconomic History  . Marital status: Married    Spouse name: Not on file  . Number of children: Not on file  . Years of education: Not on file  . Highest education level: Not on file  Occupational History  . Not on file  Social Needs  . Financial resource strain: Not on file  . Food insecurity    Worry: Not on file    Inability: Not on file  . Transportation needs    Medical: Not on file    Non-medical: Not on file  Tobacco Use  . Smoking status: Never Smoker  . Smokeless tobacco: Never Used  Substance and Sexual Activity  . Alcohol use: Not Currently    Comment: rare beer  . Drug use: Not Currently  . Sexual activity: Not Currently  Lifestyle  . Physical activity    Days per week: Not on file    Minutes per session: Not on file  . Stress: Not on file  Relationships  . Social Herbalist on phone: Not on file    Gets together: Not on file    Attends religious service: Not on file    Active  member of club or organization: Not on file    Attends meetings of clubs or organizations: Not on file    Relationship status: Not on file  . Intimate partner violence    Fear of current or ex partner: Not on file    Emotionally abused: Not on file    Physically abused: Not on file    Forced sexual activity: Not on file  Other Topics Concern  . Not on file  Social History Narrative  . Not on file   Health Maintenance  Topic Date Due  . Hepatitis C Screening  04-01-1966  . HIV Screening  02/02/1979  . FOOT EXAM  05/29/2018  . OPHTHALMOLOGY EXAM  09/10/2018  . INFLUENZA VACCINE  02/28/2019  . HEMOGLOBIN A1C  05/12/2019  . TETANUS/TDAP  04/29/2021  . COLONOSCOPY  10/22/2022  . PNEUMOCOCCAL POLYSACCHARIDE VACCINE AGE 29-64 HIGH RISK  Completed    The following portions of the patient's history were reviewed and updated as appropriate: allergies, current medications, past family history, past medical history, past social history, past surgical history and problem list.  Review of Systems Pertinent items noted in HPI and remainder of comprehensive ROS otherwise negative.   Objective:    BP 110/64   Pulse  94   Temp 97.7 F (36.5 C) (Oral)   Wt 266 lb (120.7 kg)   SpO2 96%   BMI 38.17 kg/m  General appearance: alert, cooperative and no distress Head: Normocephalic, without obvious abnormality, atraumatic Eyes: conjunctivae/corneas clear. PERRL, EOM's intact. Fundi benign. Ears: normal TM's and external ear canals both ears Nose: Nares normal. Septum midline. Mucosa normal. No drainage or sinus tenderness. Throat: lips, mucosa, and tongue normal; teeth and gums normal Neck: no adenopathy, no carotid bruit, no JVD, supple, symmetrical, trachea midline and thyroid not enlarged, symmetric, no tenderness/mass/nodules Lungs: clear to auscultation bilaterally Heart: regular rate and rhythm, S1, S2 normal, no murmur, click, rub or gallop Abdomen: soft, non-tender; bowel sounds  normal; no masses,  no organomegaly Extremities: extremities normal, atraumatic, no cyanosis or edema L olecranon process with fluid filled sac, non tender to palpation, no erythema, edema, or increased warmth. MSK: Normal appearing shoulders b/l.  No TTP of b/l clavicles and shoulders.  Decreased overhead active ROM in R shoulder.  Negative lift off test, apprehension, neers, and hawkins and empty can test.  No grinding or clunking noted with shoulder movement.  L bunion. Pulses: 2+ and symmetric Skin: Skin color, texture, turgor normal. No rashes or lesions  Toenail of L great toe thickened and yellow in color.   Lymph nodes: Cervical, supraclavicular, and axillary nodes normal. Neurologic: Alert and oriented X 3, normal strength and tone. Normal symmetric reflexes. Normal coordination and gait    Diabetic Foot Exam - Simple   Simple Foot Form Visual Inspection See comments: Yes Sensation Testing Intact to touch and monofilament testing bilaterally: Yes Pulse Check Posterior Tibialis and Dorsalis pulse intact bilaterally: Yes Comments Bunion of left foot.  Thickened, yellow toenail of L great toe.       Assessment:    Healthy male exam with multiple skeletal complaints.     Plan:     Anticipatory guidance given including wearing seatbelts, smoke detectors in the home, increasing physical activity, increasing p.o. intake of water and vegetables. -will obtain labs -given handout See After Visit Summary for Counseling Recommendations    Prostate cancer screening - Plan: PSA  Acute pain of right shoulder  -AC joint arthritis vs rotator cuff injury. -Rest, ice, NSAIDs, ROM exercises - Plan: CBC with Differential/Platelet, DG Shoulder Right  Olecranon bursitis of left elbow  -Discussed bursitis versus gout flare -Given handout -Discussed rest, ice, Mobic - Plan: Uric Acid  Bunion, left foot  - Plan: Ambulatory referral to Podiatry  Onychomycosis  -Discussed  over-the-counter treatment options which may take several months to resolve infetion -Also discussed p.o. medication.  Patient declines at this time. - Plan: Ambulatory referral to Podiatry  Screening for prostate cancer  - Plan: PSA  History of gout  -concern for gout affecting L elbow as opposed to bursitis -Continue allopurinol - Plan: Uric Acid  Screening for cholesterol level  - Plan: Lipid panel  Essential hypertension  -Controlled -Continue lisinopril-hydrochlorothiazide 20-12.5 mg daily -Discussed lifestyle modifications -Patient encouraged to check BP at home and keep a log to bring with him to clinic - Plan: Basic metabolic panel  Follow-up PRN  Grier Mitts, MD

## 2019-01-29 ENCOUNTER — Other Ambulatory Visit: Payer: Self-pay

## 2019-01-29 ENCOUNTER — Encounter: Payer: Self-pay | Admitting: Family Medicine

## 2019-01-29 ENCOUNTER — Other Ambulatory Visit (INDEPENDENT_AMBULATORY_CARE_PROVIDER_SITE_OTHER): Payer: BC Managed Care – PPO

## 2019-01-29 DIAGNOSIS — M25511 Pain in right shoulder: Secondary | ICD-10-CM

## 2019-01-29 DIAGNOSIS — Z13220 Encounter for screening for lipoid disorders: Secondary | ICD-10-CM

## 2019-01-29 DIAGNOSIS — Z125 Encounter for screening for malignant neoplasm of prostate: Secondary | ICD-10-CM

## 2019-01-29 DIAGNOSIS — M7022 Olecranon bursitis, left elbow: Secondary | ICD-10-CM | POA: Diagnosis not present

## 2019-01-29 DIAGNOSIS — Z8739 Personal history of other diseases of the musculoskeletal system and connective tissue: Secondary | ICD-10-CM | POA: Diagnosis not present

## 2019-01-29 DIAGNOSIS — I10 Essential (primary) hypertension: Secondary | ICD-10-CM | POA: Diagnosis not present

## 2019-01-29 LAB — BASIC METABOLIC PANEL
BUN: 18 mg/dL (ref 6–23)
CO2: 25 mEq/L (ref 19–32)
Calcium: 9.1 mg/dL (ref 8.4–10.5)
Chloride: 105 mEq/L (ref 96–112)
Creatinine, Ser: 1.27 mg/dL (ref 0.40–1.50)
GFR: 71.25 mL/min (ref >60.00)
Glucose, Bld: 118 mg/dL — ABNORMAL HIGH (ref 70–99)
Potassium: 4.4 mEq/L (ref 3.5–5.1)
Sodium: 140 mEq/L (ref 135–145)

## 2019-01-29 LAB — CBC WITH DIFFERENTIAL/PLATELET
Basophils Absolute: 0.1 10*3/uL (ref 0.0–0.1)
Basophils Relative: 1.2 % (ref 0.0–3.0)
Eosinophils Absolute: 0.2 10*3/uL (ref 0.0–0.7)
Eosinophils Relative: 2.7 % (ref 0.0–5.0)
HCT: 45.7 % (ref 39.0–52.0)
Hemoglobin: 15.2 g/dL (ref 13.0–17.0)
Lymphocytes Relative: 33.2 % (ref 12.0–46.0)
Lymphs Abs: 2 10*3/uL (ref 0.7–4.0)
MCHC: 33.2 g/dL (ref 30.0–36.0)
MCV: 77.1 fl — ABNORMAL LOW (ref 78.0–100.0)
Monocytes Absolute: 0.5 10*3/uL (ref 0.1–1.0)
Monocytes Relative: 8.4 % (ref 3.0–12.0)
Neutro Abs: 3.3 10*3/uL (ref 1.4–7.7)
Neutrophils Relative %: 54.5 % (ref 43.0–77.0)
Platelets: 280 10*3/uL (ref 150.0–400.0)
RBC: 5.93 Mil/uL — ABNORMAL HIGH (ref 4.22–5.81)
RDW: 15.2 % (ref 11.5–15.5)
WBC: 6.1 10*3/uL (ref 4.0–10.5)

## 2019-01-29 LAB — LIPID PANEL
Cholesterol: 96 mg/dL (ref 0–200)
HDL: 33.7 mg/dL — ABNORMAL LOW (ref >39.00)
LDL Cholesterol: 51 mg/dL (ref 0–99)
NonHDL: 62.57
Total CHOL/HDL Ratio: 3
Triglycerides: 60 mg/dL (ref 0.0–149.0)
VLDL: 12 mg/dL (ref 0.0–40.0)

## 2019-01-29 LAB — URIC ACID: Uric Acid, Serum: 4 mg/dL (ref 4.0–7.8)

## 2019-01-29 LAB — PSA: PSA: 0.34 ng/mL (ref 0.10–4.00)

## 2019-02-08 ENCOUNTER — Other Ambulatory Visit: Payer: Self-pay | Admitting: Endocrinology

## 2019-02-09 ENCOUNTER — Ambulatory Visit: Payer: BC Managed Care – PPO | Admitting: Podiatry

## 2019-02-09 ENCOUNTER — Encounter: Payer: Self-pay | Admitting: Podiatry

## 2019-02-09 ENCOUNTER — Other Ambulatory Visit: Payer: Self-pay

## 2019-02-09 ENCOUNTER — Ambulatory Visit (INDEPENDENT_AMBULATORY_CARE_PROVIDER_SITE_OTHER): Payer: BC Managed Care – PPO

## 2019-02-09 VITALS — BP 112/71 | Temp 97.2°F

## 2019-02-09 DIAGNOSIS — M21612 Bunion of left foot: Secondary | ICD-10-CM | POA: Diagnosis not present

## 2019-02-09 DIAGNOSIS — G5792 Unspecified mononeuropathy of left lower limb: Secondary | ICD-10-CM | POA: Diagnosis not present

## 2019-02-09 DIAGNOSIS — M205X2 Other deformities of toe(s) (acquired), left foot: Secondary | ICD-10-CM | POA: Diagnosis not present

## 2019-02-09 DIAGNOSIS — M21619 Bunion of unspecified foot: Secondary | ICD-10-CM

## 2019-02-10 ENCOUNTER — Encounter: Payer: Self-pay | Admitting: Family Medicine

## 2019-02-11 NOTE — Progress Notes (Signed)
   HPI: 53 y.o. male presenting today as a new patient with a chief complaint of a possible bunion deformity of the left great toe that has been present for the past few years. He reports associated swelling of the area. He denies any pain and states he has no feeling over the area. He denies modifying factors and has not done anything for treatment. Patient is here for further evaluation and treatment.   Past Medical History:  Diagnosis Date  . Diabetes mellitus   . 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 Gout   . Hyperlipidemia   . Hypertension   . Sleep apnea    CPAP use      Physical Exam: General: The patient is alert and oriented x3 in no acute distress.  Dermatology: Skin is warm, dry and supple bilateral lower extremities. Negative for open lesions or macerations.  Vascular: Palpable pedal pulses bilaterally. No edema or erythema noted. Capillary refill within normal limits.  Neurological: Epicritic and protective threshold intact.  Paresthesia with burning, shooting pain noted to the left great toe.  Musculoskeletal Exam: Range of motion within normal limits to all pedal and ankle joints bilateral. Muscle strength 5/5 in all groups bilateral.   Radiographic Exam:  Normal osseous mineralization. Joint spaces preserved. No fracture/dislocation/boney destruction.    Assessment: 1. Hallux limitus left - asymptomatic  2. Neuritis left great toe   Plan of Care:  1. Patient evaluated. X-Rays reviewed.  2. Recommended good shoe gear.  3. May resume full activity with no restrictions.  4. Recommended OTC Motrin as needed.  5. Return to clinic as needed.      Edrick Kins, DPM [facility]  Dr. Edrick Kins, DPM    [address], [place] [address]                Office [phone]  Fax [phone]

## 2019-02-12 ENCOUNTER — Other Ambulatory Visit: Payer: Self-pay

## 2019-02-12 MED ORDER — SIMVASTATIN 20 MG PO TABS: ORAL_TABLET | ORAL | 4 refills | Status: DC

## 2019-02-13 ENCOUNTER — Other Ambulatory Visit: Payer: Self-pay | Admitting: Endocrinology

## 2019-02-16 ENCOUNTER — Other Ambulatory Visit (INDEPENDENT_AMBULATORY_CARE_PROVIDER_SITE_OTHER): Payer: BC Managed Care – PPO

## 2019-02-16 ENCOUNTER — Other Ambulatory Visit: Payer: Self-pay

## 2019-02-16 DIAGNOSIS — E790 Hyperuricemia without signs of inflammatory arthritis and tophaceous disease: Secondary | ICD-10-CM | POA: Diagnosis not present

## 2019-02-16 DIAGNOSIS — E291 Testicular hypofunction: Secondary | ICD-10-CM

## 2019-02-16 DIAGNOSIS — E1165 Type 2 diabetes mellitus with hyperglycemia: Secondary | ICD-10-CM | POA: Diagnosis not present

## 2019-02-17 LAB — BASIC METABOLIC PANEL
BUN: 16 mg/dL (ref 6–23)
CO2: 21 mEq/L (ref 19–32)
Calcium: 9.2 mg/dL (ref 8.4–10.5)
Chloride: 103 mEq/L (ref 96–112)
Creatinine, Ser: 1.38 mg/dL (ref 0.40–1.50)
GFR: 64.72 mL/min (ref >60.00)
Glucose, Bld: 127 mg/dL — ABNORMAL HIGH (ref 70–99)
Potassium: 3.9 mEq/L (ref 3.5–5.1)
Sodium: 136 mEq/L (ref 135–145)

## 2019-02-17 LAB — HEMOGLOBIN A1C: Hgb A1c MFr Bld: 7.1 % — ABNORMAL HIGH (ref 4.6–6.5)

## 2019-02-17 LAB — CBC
HCT: 44.6 % (ref 39.0–52.0)
Hemoglobin: 14.5 g/dL (ref 13.0–17.0)
MCHC: 32.5 g/dL (ref 30.0–36.0)
MCV: 78.9 fl (ref 78.0–100.0)
Platelets: 272 10*3/uL (ref 150.0–400.0)
RBC: 5.66 Mil/uL (ref 4.22–5.81)
RDW: 15.3 % (ref 11.5–15.5)
WBC: 5.7 10*3/uL (ref 4.0–10.5)

## 2019-02-17 LAB — TESTOSTERONE: Testosterone: 195.99 ng/dL — ABNORMAL LOW (ref 300.00–890.00)

## 2019-02-17 LAB — URIC ACID: Uric Acid, Serum: 3.6 mg/dL — ABNORMAL LOW (ref 4.0–7.8)

## 2019-02-19 ENCOUNTER — Other Ambulatory Visit: Payer: Self-pay | Admitting: Family Medicine

## 2019-02-23 ENCOUNTER — Other Ambulatory Visit: Payer: Self-pay

## 2019-02-23 ENCOUNTER — Ambulatory Visit: Payer: BC Managed Care – PPO | Admitting: Endocrinology

## 2019-02-23 ENCOUNTER — Encounter: Payer: Self-pay | Admitting: Endocrinology

## 2019-02-23 VITALS — BP 110/62 | HR 103 | Ht 70.0 in | Wt 267.8 lb

## 2019-02-23 DIAGNOSIS — E1165 Type 2 diabetes mellitus with hyperglycemia: Secondary | ICD-10-CM

## 2019-02-23 DIAGNOSIS — I10 Essential (primary) hypertension: Secondary | ICD-10-CM

## 2019-02-23 DIAGNOSIS — E291 Testicular hypofunction: Secondary | ICD-10-CM | POA: Diagnosis not present

## 2019-02-23 NOTE — Patient Instructions (Signed)
Take Meloxicam 3/7 or ask for 7.5mg   Amlodipine 1/2 daily

## 2019-02-23 NOTE — Progress Notes (Signed)
Patient ID: Clifford Kramer, male   DOB: 22-May-1966, 53 y.o.   MRN: [id number]     Reason for Appointment : Endocrinology follow-up  History of Present Illness          Diagnosis: Type 2 diabetes mellitus, date of diagnosis: 2005     Background information: He was probably started on metformin at the time of diagnosis when his blood sugars were not very high About 5-6 years ago because of higher blood sugars he was also given glipizide  He appeared to have  progression of his diabetes with A1c going up to 8.1% in 01/2013; also had difficulty with losing weight before he was started on Victoza.  With this his blood sugars were significantly better His glipizide was reduced to 2.5 mg in 10/14 because of rare hypoglycemia    RECENT history:   Antihyperglycemic drugs : Ozempic 1 mg weekly, and Amaryl 1 mg pcs, Synjardy XR 12.11/998, 2 tablets daily   Work routine: He starts work at [employer] AM for [employer]  Current management, blood sugar patterns and problems:  His A1c is starting to improve at 7.1 compared to 7.5, previously has been as low as 6.4  He has checked his sugars more consistently and up to twice a day  Although he is checking blood sugars mostly in the afternoons not clear if some of these readings are after meals because of his variable sleep and wake times  He may sometimes see readings over 200 after breakfast but mostly when he is eating out while working  Otherwise in the afternoon his blood sugars are usually not over 170  He does think that is the strips are not expired and is checking blood sugars more often than before  He is trying to be active on his own with walking or mowing the lawn but not as much as when he was able to go to the gym  His weight has leveled off but better than in February  Only occasionally may feel little hypoglycemic when blood sugars are below 100, this may be after period of activity   Side effects from medications have been:  None       Monitors blood glucose: once a day or twice          Glucometer:      Accu-Chek  Blood Glucose readings from monitor download below   PRE-MEAL Fasting Lunch Dinner Bedtime Overall  Glucose range:  119-151      Mean/median:      140   POST-MEAL PC Breakfast PC Lunch PC Dinner  Glucose range:  123-212  135-177  136-184  Mean/median:   155      LIFESTYLE: Meals: 3 meals per day. Dinner 5 pm, Bfst 9 am; lunch 2 pm;  eating out periodically  His breakfast:  eggs and toast or meat  Usually eating fruit and sandwich at lunch, usually a grilled meat at suppertime   Physical activity: exercise: 2/7 days a week, at gym or walking, 45 min      Dietician visit: 05/2013           Wt Readings from Last 3 Encounters:  02/23/19 267 lb 12.8 oz (121.5 kg)  01/28/19 266 lb (120.7 kg)  09/22/18 276 lb (125.2 kg)   Lab Results  Component Value Date   HGBA1C 7.1 (H) 02/16/2019   HGBA1C 7.5 (H) 11/10/2018   HGBA1C 7.2 (H) 08/12/2018   Lab Results  Component Value Date  MICROALBUR <0.7 08/11/2018   LDLCALC 51 01/29/2019   CREATININE 1.38 02/16/2019    Other active problems including new problems are in review of systems    No visits with results within 1 Week(s) from this visit.  Latest known visit with results is:  Lab on 02/16/2019  Component Date Value Ref Range Status  . Uric Acid, Serum 02/16/2019 3.6* 4.0 - 7.8 mg/dL Final  . WBC 02/16/2019 5.7  4.0 - 10.5 K/uL Final  . RBC 02/16/2019 5.66  4.22 - 5.81 Mil/uL Final  . Platelets 02/16/2019 272.0  150.0 - 400.0 K/uL Final  . Hemoglobin 02/16/2019 14.5  13.0 - 17.0 g/dL Final  . HCT 02/16/2019 44.6  39.0 - 52.0 % Final  . MCV 02/16/2019 78.9  78.0 - 100.0 fl Final  . MCHC 02/16/2019 32.5  30.0 - 36.0 g/dL Final  . RDW 02/16/2019 15.3  11.5 - 15.5 % Final  . Testosterone 02/16/2019 195.99* 300.00 - 890.00 ng/dL Final  . Sodium 02/16/2019 136  135 - 145 mEq/L Final  . Potassium 02/16/2019 3.9  3.5 - 5.1 mEq/L Final   . Chloride 02/16/2019 103  96 - 112 mEq/L Final  . CO2 02/16/2019 21  19 - 32 mEq/L Final  . Glucose, Bld 02/16/2019 127* 70 - 99 mg/dL Final  . BUN 02/16/2019 16  6 - 23 mg/dL Final  . Creatinine, Ser 02/16/2019 1.38  0.40 - 1.50 mg/dL Final  . Calcium 02/16/2019 9.2  8.4 - 10.5 mg/dL Final  . GFR 02/16/2019 64.72  >60.00 mL/min Final  . Hgb A1c MFr Bld 02/16/2019 7.1* 4.6 - 6.5 % Final   Glycemic Control Guidelines for People with Diabetes:Non Diabetic:  <6%Goal of Therapy: <7%Additional Action Suggested:  >8%     Allergies as of 02/23/2019      Reactions   Sulfa Antibiotics Itching   REACTION: itching   Celebrex [celecoxib] Itching   REACTION: itching   Penicillins Itching   Sulfonamide Derivatives Itching   REACTION: itching      Medication List       Accurate as of February 23, 2019 11:59 PM. If you have any questions, ask your nurse or doctor.        STOP taking these medications   hydrochlorothiazide 12.5 MG capsule Commonly known as: MICROZIDE Stopped by: Elayne Snare, MD   lisinopril 20 MG tablet Commonly known as: ZESTRIL Stopped by: Elayne Snare, MD     TAKE these medications   allopurinol 300 MG tablet Commonly known as: ZYLOPRIM Take 1 tablet (300 mg total) by mouth daily.   amLODipine 5 MG tablet Commonly known as: NORVASC TAKE 1 TABLET BY MOUTH EVERY DAY   Androderm 4 MG/24HR Pt24 patch Generic drug: testosterone PLACE 2 PATCHES ONTO THE SKIN DAILY   aspirin 81 MG tablet Take 81 mg by mouth daily.   BD Pen Needle Nano U/F 32G X 4 MM Misc Generic drug: Insulin Pen Needle USE EVERY DAY   fluticasone 50 MCG/ACT nasal spray Commonly known as: FLONASE PLACE 2 SPRAYS INTO THE NOSE DAILY.   glimepiride 1 MG tablet Commonly known as: AMARYL TAKE 1 TABLET (1 MG TOTAL) BY MOUTH DAILY WITH BREAKFAST.   glucose blood test strip Commonly known as: Accu-Chek Guide Use as instructed   ONE TOUCH ULTRA TEST test strip Generic drug: glucose blood USE AS  INSTRUCTED TO CHECK BLOOD SUGAR TWO TIMES A DAY DX CODE E11.9   lisinopril-hydrochlorothiazide 20-12.5 MG tablet Commonly known as: Zestoretic Take 1  tablet by mouth daily.   meloxicam 15 MG tablet Commonly known as: MOBIC Take 15 mg by mouth daily.   omeprazole 40 MG capsule Commonly known as: PRILOSEC TAKE 1 CAPSULE BY MOUTH EVERY DAY   OneTouch Delica Lancets Fine Misc Use to check blood sugar 2 times per day, dx code E11.9   Accu-Chek FastClix Lancets Misc Use to check blood sugar 3 times daily. Dx Code E11.9   OneTouch Verio IQ System w/Device Kit Use to check blood sugar 2 times daily   Ozempic (1 MG/DOSE) 2 MG/1.5ML Sopn Generic drug: Semaglutide (1 MG/DOSE) INJECT 1 MG INTO THE SKIN ONCE A WEEK.   scopolamine 1 MG/3DAYS Commonly known as: Transderm-Scop (1.5 MG) Place 1 patch (1.5 mg total) onto the skin every 3 (three) days.   simvastatin 20 MG tablet Commonly known as: ZOCOR TAKE 1 TABLET AT BEDTIME   Synjardy XR 12.11-998 MG Tb24 Generic drug: Empagliflozin-metFORMIN HCl ER TAKE 2 TABLETS BY MOUTH DAILY WITH BREAKFAST.   Viagra 100 MG tablet Generic drug: sildenafil       Allergies:  Allergies  Allergen Reactions  . Sulfa Antibiotics Itching    REACTION: itching  . Celebrex [Celecoxib] Itching    REACTION: itching  . Penicillins Itching  . Sulfonamide Derivatives Itching    REACTION: itching    Past Medical History:  Diagnosis Date  . Diabetes mellitus   . 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 Gout   . Hyperlipidemia   . Hypertension   . Sleep apnea    CPAP use     Past Surgical History:  Procedure Laterality Date  . COLONOSCOPY  2015  . KNEE SURGERY Left 2005    Family History  Problem Relation Age of Onset  . Diabetes Mother   . Stomach cancer Other   . Diabetes Maternal Aunt   . Diabetes Maternal Grandmother   . Heart disease Neg Hx   . Colon cancer Neg Hx   . Esophageal cancer Neg Hx   . Rectal cancer Neg Hx      Social History:  reports that he has never smoked. He has never used smokeless tobacco. He reports previous alcohol use. He reports previous drug use.    Review of Systems    HYPERTENSION: This is well controlled with Norvasc 5 mg and  20 mg lisinopril HCTZ was stopped with using SGLT 2 drugs and because of hyperuricemia However his home blood pressure was higher and this was started back  Home readings have been before medication  110/70  as high as 129/87 but as low as 116/72 otherwise Most of his diastolic numbers are in the upper 70s and low 80s and better than before  BP Readings from Last 3 Encounters:  02/23/19 110/62  02/09/19 112/71  01/28/19 110/64   Creatinine slightly variable:  Lab Results  Component Value Date   CREATININE 1.38 02/16/2019   CREATININE 1.27 01/29/2019   CREATININE 1.34 11/10/2018     GOUT: He has had history of recurrent episodes of probable gout in various joints including knee and elbow in the past Baseline uric acid was 8.4 done in 2012 This is now  Taking allopurinol 300 mg daily regularly with last uric acid 3.3      Hyperlipidemia: LDL has been controlled, baseline previously 109, has been taking simvastatin 20 mg. Has a low HDL  Lab Results  Component Value Date   CHOL 96 01/29/2019   HDL 33.70 (L) 01/29/2019   LDLCALC 51  01/29/2019   TRIG 60.0 01/29/2019   CHOLHDL 3 01/29/2019       HYPOGONADISM:  He has been on testosterone supplements since 2011 when his level was mildly low around 300  but no further evaluation done.   He has a relatively low testosterone level while using Axiron and had difficulty with the application With Androderm patches he had improved testosterone levels and less fatigue  His testosterone level has been consistent with using 8 mg Androderm in the normal range Does not complain of any unusual fatigue Occasionally his patches may come off with sweating and he may have to put it on his leg  He  may have not put on the past the day before his lab was drawn and his level is unusually low Currently Gaynelle Cage is not covered by his insurance   Lab Results  Component Value Date   TESTOSTERONE 195.99 (L) 02/16/2019    He takes Viagra for erectile dysfunction  LABS:  No visits with results within 1 Week(s) from this visit.  Latest known visit with results is:  Lab on 02/16/2019  Component Date Value Ref Range Status  . Uric Acid, Serum 02/16/2019 3.6* 4.0 - 7.8 mg/dL Final  . WBC 02/16/2019 5.7  4.0 - 10.5 K/uL Final  . RBC 02/16/2019 5.66  4.22 - 5.81 Mil/uL Final  . Platelets 02/16/2019 272.0  150.0 - 400.0 K/uL Final  . Hemoglobin 02/16/2019 14.5  13.0 - 17.0 g/dL Final  . HCT 02/16/2019 44.6  39.0 - 52.0 % Final  . MCV 02/16/2019 78.9  78.0 - 100.0 fl Final  . MCHC 02/16/2019 32.5  30.0 - 36.0 g/dL Final  . RDW 02/16/2019 15.3  11.5 - 15.5 % Final  . Testosterone 02/16/2019 195.99* 300.00 - 890.00 ng/dL Final  . Sodium 02/16/2019 136  135 - 145 mEq/L Final  . Potassium 02/16/2019 3.9  3.5 - 5.1 mEq/L Final  . Chloride 02/16/2019 103  96 - 112 mEq/L Final  . CO2 02/16/2019 21  19 - 32 mEq/L Final  . Glucose, Bld 02/16/2019 127* 70 - 99 mg/dL Final  . BUN 02/16/2019 16  6 - 23 mg/dL Final  . Creatinine, Ser 02/16/2019 1.38  0.40 - 1.50 mg/dL Final  . Calcium 02/16/2019 9.2  8.4 - 10.5 mg/dL Final  . GFR 02/16/2019 64.72  >60.00 mL/min Final  . Hgb A1c MFr Bld 02/16/2019 7.1* 4.6 - 6.5 % Final   Glycemic Control Guidelines for People with Diabetes:Non Diabetic:  <6%Goal of Therapy: <7%Additional Action Suggested:  >8%      Physical Examination:  BP 110/62 (BP Location: Left Arm, Patient Position: Sitting, Cuff Size: Large)   Pulse (!) 103   Ht 5' 10" (1.778 m)   Wt 267 lb 12.8 oz (121.5 kg)   SpO2 98%   BMI 38.43 kg/m         ASSESSMENT/PLAN:   Diabetes type 2, with obesity, BMI 39  See history of present illness for detailed discussion of his current  management, blood sugar patterns and problems identified  His A1c is improved at 7.1  His blood sugars are variable but only occasionally higher postprandially and generally under 180 except on some occasions after breakfast He is doing better with monitoring his blood sugar lately Also if he has blood sugars below 100 he says he feels hypoglycemic which is rare He is trying to increase his activity level and weight is improving this year We will continue his regimen unchanged for now  Discussed continuing to check readings after meals to help adjust his diet  His creatinine is high normal with using Synjardy but he is also taking meloxicam as discussed below and his blood pressure is low normal  HYPERTENSION:  His blood pressure is low normal now Had improved with HCTZ No recurrence of gout or hypokalemia with HCTZ Since his blood pressure is low normal will have him reduce his amlodipine to half a tablet  Uric acid normal and controlling symptoms of gout  Chronic use of nonsteroidal drugs: Discussed that he should at least reduce his meloxicam to every other day or go down to 7.5 mg daily instead of 15 and discussed potential renal and blood pressure effects of this  LIPIDS: Excellent control with simvastatin prescribed by PCP  Hypogonadism: Previously had normal levels and his level is likely low today because of not putting on his patches the day before he had his labs  He is on Androderm 8 mg and he will continue the same dose for now  Follow-up in 3 months    Patient Instructions  Take Meloxicam 3/7 or ask for 7.24m  Amlodipine 1/2 daily  Total visit time for evaluation and management of multiple problems and counseling =25 minutes   AElayne Snare7/28/2020, 9:27 AM

## 2019-03-03 ENCOUNTER — Encounter: Payer: Self-pay | Admitting: Family Medicine

## 2019-03-08 ENCOUNTER — Other Ambulatory Visit: Payer: Self-pay | Admitting: Endocrinology

## 2019-03-09 ENCOUNTER — Other Ambulatory Visit: Payer: Self-pay

## 2019-03-09 ENCOUNTER — Telehealth (INDEPENDENT_AMBULATORY_CARE_PROVIDER_SITE_OTHER): Payer: BC Managed Care – PPO | Admitting: Family Medicine

## 2019-03-09 DIAGNOSIS — M19011 Primary osteoarthritis, right shoulder: Secondary | ICD-10-CM

## 2019-03-09 DIAGNOSIS — M654 Radial styloid tenosynovitis [de Quervain]: Secondary | ICD-10-CM

## 2019-03-09 DIAGNOSIS — M7022 Olecranon bursitis, left elbow: Secondary | ICD-10-CM

## 2019-03-09 DIAGNOSIS — E1165 Type 2 diabetes mellitus with hyperglycemia: Secondary | ICD-10-CM

## 2019-03-09 MED ORDER — PREDNISONE 10 MG PO TABS: ORAL_TABLET | ORAL | 0 refills | Status: DC

## 2019-03-09 NOTE — Progress Notes (Signed)
Virtual Visit via Video Note  I connected with Clifford Kramer on 03/09/19 at  4:30 PM EDT by a video enabled telemedicine application 2/2 [id number] pandemic and verified that I am speaking with the correct person using two identifiers.  Location patient: home Location provider:work or home office Persons participating in the virtual visit: patient, provider  I discussed the limitations of evaluation and management by telemedicine and the availability of in person appointments. The patient expressed understanding and agreed to proceed.   HPI: Pt with R wrist and shoulder pain.  Pain is constant, difficult to raise arm, sleeping is uncomfortable.  Stiff shoulder in am, but loosens throughout the day, then stiffens at night.  Xray 01/2019 with AC joint degenerative changes.  Decreased ROM R wrist.  Feels like has decreased strength in hand.  Endorses edema in R hand and wrist.  Denies numbness, tingling in RUE, or pain in wrist waking him up at night.  Since using the LUE more has noticed swelling/ a fluid filled sac on L elbow.  Was using Mobic daily.  Felt like it helped.  Advised to take QOD after  f/u with Dr. Dwyane Dee for DM as creatinine was elevated.   ROS: See pertinent positives and negatives per HPI.  Past Medical History:  Diagnosis Date  . Diabetes mellitus   . 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 Gout   . Hyperlipidemia   . Hypertension   . Sleep apnea    CPAP use     Past Surgical History:  Procedure Laterality Date  . COLONOSCOPY  2015  . KNEE SURGERY Left 2005    Family History  Problem Relation Age of Onset  . Diabetes Mother   . Stomach cancer Other   . Diabetes Maternal Aunt   . Diabetes Maternal Grandmother   . Heart disease Neg Hx   . Colon cancer Neg Hx   . Esophageal cancer Neg Hx   . 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  Pt at times gets meds from the [place].  Current Outpatient Medications:   .  ACCU-CHEK FASTCLIX LANCETS MISC, Use to check blood sugar 3 times daily. Dx Code E11.9, Disp: 102 each, Rfl: 3 .  allopurinol (ZYLOPRIM) 300 MG tablet, Take 1 tablet (300 mg total) by mouth daily., Disp: 90 tablet, Rfl: 1 .  amLODipine (NORVASC) 5 MG tablet, TAKE 1 TABLET BY MOUTH EVERY DAY, Disp: 90 tablet, Rfl: 0 .  ANDRODERM 4 MG/24HR PT24 patch, PLACE 2 PATCHES ONTO THE SKIN DAILY, Disp: 180 patch, Rfl: 1 .  aspirin 81 MG tablet, Take 81 mg by mouth daily.  , Disp: , Rfl:  .  BD PEN NEEDLE NANO U/F 32G X 4 MM MISC, USE EVERY DAY, Disp: 100 each, Rfl: 1 .  Blood Glucose Monitoring Suppl (ONETOUCH VERIO IQ SYSTEM) W/DEVICE KIT, Use to check blood sugar 2 times daily, Disp: 1 kit, Rfl: 0 .  fluticasone (FLONASE) 50 MCG/ACT nasal spray, PLACE 2 SPRAYS INTO THE NOSE DAILY., Disp: 32 g, Rfl: 6 .  glimepiride (AMARYL) 1 MG tablet, TAKE 1 TABLET (1 MG TOTAL) BY MOUTH DAILY WITH BREAKFAST., Disp: 90 tablet, Rfl: 1 .  glucose blood (ACCU-CHEK GUIDE) test strip, Use as instructed, Disp: 100 each, Rfl: 12 .  lisinopril-hydrochlorothiazide (ZESTORETIC) 20-12.5 MG tablet, Take 1 tablet by mouth daily., Disp: 90 tablet, Rfl: 3 .  meloxicam (  MOBIC) 15 MG tablet, Take 15 mg by mouth daily., Disp: , Rfl:  .  omeprazole (PRILOSEC) 40 MG capsule, TAKE 1 CAPSULE BY MOUTH EVERY DAY, Disp: 100 capsule, Rfl: 4 .  ONE TOUCH ULTRA TEST test strip, USE AS INSTRUCTED TO CHECK BLOOD SUGAR TWO TIMES A DAY DX CODE E11.9, Disp: 200 each, Rfl: 1 .  ONETOUCH DELICA LANCETS FINE MISC, Use to check blood sugar 2 times per day, dx code E11.9, Disp: 200 each, Rfl: 1 .  OZEMPIC, 1 MG/DOSE, 2 MG/1.5ML SOPN, INJECT 1 MG INTO THE SKIN ONCE A WEEK., Disp: 2 pen, Rfl: 3 .  scopolamine (TRANSDERM-SCOP, 1.5 MG,) 1 MG/3DAYS, Place 1 patch (1.5 mg total) onto the skin every 3 (three) days., Disp: 6 patch, Rfl: 3 .  simvastatin (ZOCOR) 20 MG tablet, TAKE 1 TABLET AT BEDTIME, Disp: 90 tablet, Rfl: 4 .  SYNJARDY XR 12.11-998 MG TB24, TAKE  2 TABLETS BY MOUTH DAILY WITH BREAKFAST., Disp: 180 tablet, Rfl: 2 .  VIAGRA 100 MG tablet, , Disp: , Rfl:   Current Facility-Administered Medications:  .  0.9 %  sodium chloride infusion, 500 mL, Intravenous, Once, Pyrtle, Lajuan Lines, MD  EXAM:  VITALS per patient if applicable: RR between 21-30 bpm  GENERAL: alert, oriented, appears well and in no acute distress  HEENT: atraumatic, conjunctiva clear, no obvious abnormalities on inspection of external nose and ears  NECK: normal movements of the head and neck  LUNGS: on inspection no signs of respiratory distress, breathing rate appears normal, no obvious gross SOB, gasping or wheezing  CV: no obvious cyanosis  MS: Decreased Abduction of R shoulder 2/2 discomfort.  L elbow with visible busa, mobile to pt's touch.  moves all other visible extremities without noticeable abnormality.  Pain in R lateral wrist proximal to anatomic snuff box with circular movement of thumb.  No edema or erythema of RUE.  PSYCH/NEURO: pleasant and cooperative, no obvious depression or anxiety, speech and thought processing grossly intact  ASSESSMENT AND PLAN:  Discussed the following assessment and plan:  De Quervain's tenosynovitis  -rest, Tylenol prn -discussed treatment options including steroid injection.  Pt declines at this time.  Will try short course of po prednisone.  Advised will elevate bs. - Plan: predniSONE (DELTASONE) 10 MG tablet  Arthritis of right acromioclavicular joint  -R shoulder xray 01/28/19 with degenerative changes -discussed d/c'ing Mobic use given elevation of creatinine. -Tylenol arthritis strength, voltaren gel prn, stretching, heat -for continued symptoms discussed referral to Ortho - Plan: predniSONE (DELTASONE) 10 MG tablet  Olecranon bursitis of left elbow  -supportive care: rest, aspercreame, ice,  use NSAIDs sparingly 2/2 elevation in creatinine.  DM II uncontrolled -advised to check fsbs regularly as prednisone use  will cause hyperglycemia -stressed the importance of lifestyle modifications -continue current meds: synjardy and ozempic  F/u in 2 wks   I discussed the assessment and treatment plan with the patient. The patient was provided an opportunity to ask questions and all were answered. The patient agreed with the plan and demonstrated an understanding of the instructions.   The patient was advised to call back or seek an in-person evaluation if the symptoms worsen or if the condition fails to improve as anticipated.   [name], MD

## 2019-03-25 ENCOUNTER — Encounter: Payer: Self-pay | Admitting: Family Medicine

## 2019-03-31 ENCOUNTER — Encounter: Payer: Self-pay | Admitting: Family Medicine

## 2019-03-31 ENCOUNTER — Other Ambulatory Visit: Payer: Self-pay

## 2019-03-31 MED ORDER — OMEPRAZOLE 40 MG PO CPDR: 40.0000 mg | DELAYED_RELEASE_CAPSULE | Freq: Every day | ORAL | 0 refills | Status: DC

## 2019-04-02 ENCOUNTER — Encounter: Payer: Self-pay | Admitting: Family Medicine

## 2019-04-11 ENCOUNTER — Other Ambulatory Visit: Payer: Self-pay | Admitting: Endocrinology

## 2019-04-21 ENCOUNTER — Other Ambulatory Visit: Payer: Self-pay | Admitting: Family Medicine

## 2019-04-25 ENCOUNTER — Encounter: Payer: Self-pay | Admitting: Family Medicine

## 2019-04-30 ENCOUNTER — Encounter: Payer: Self-pay | Admitting: Family Medicine

## 2019-05-02 ENCOUNTER — Encounter: Payer: Self-pay | Admitting: Family Medicine

## 2019-05-04 ENCOUNTER — Telehealth (INDEPENDENT_AMBULATORY_CARE_PROVIDER_SITE_OTHER): Payer: BC Managed Care – PPO | Admitting: Family Medicine

## 2019-05-04 ENCOUNTER — Other Ambulatory Visit: Payer: Self-pay

## 2019-05-04 DIAGNOSIS — M19011 Primary osteoarthritis, right shoulder: Secondary | ICD-10-CM | POA: Diagnosis not present

## 2019-05-04 DIAGNOSIS — M79642 Pain in left hand: Secondary | ICD-10-CM

## 2019-05-04 DIAGNOSIS — M79641 Pain in right hand: Secondary | ICD-10-CM

## 2019-05-04 DIAGNOSIS — M654 Radial styloid tenosynovitis [de Quervain]: Secondary | ICD-10-CM | POA: Diagnosis not present

## 2019-05-04 NOTE — Telephone Encounter (Signed)
Pt has a virtual appointment with Dr Volanda Napoleon today

## 2019-05-04 NOTE — Progress Notes (Signed)
Virtual Visit via Telephone Note  I connected with Clifford Kramer on 05/04/19 at 10:00 AM EDT by telephone and verified that I am speaking with the correct person using two identifiers.   I discussed the limitations, risks, security and privacy concerns of performing an evaluation and management service by telephone and the availability of in person appointments. I also discussed with the patient that there may be a patient responsible charge related to this service. The patient expressed understanding and agreed to proceed.  Location patient: home Location provider: work or home office Participants present for the call: patient, provider Patient did not have a visit in the prior 7 days to address this/these issue(s).   History of Present Illness: Pt notes improvement in R shoulders, hands, and wrist since given prednisone last visit.  Pt has been taking tylenol arthritis which takes the edge off.  He has also been using mobic, which doesn't help.  May use mobic 2-3 x per wk.  Was advised to d/c it given elevated creatinine. Pt states voltaren gel takes the edge off.  Pt endorses decreased ROM, unable to lift arm above shoulder.  Notes difficulty sleeping 2/2 pain in shoulders.    Pt states fsbs is 120 in the am and 130-140s after a meal.  Has f/u with Dr. Dwyane Dee later this month.   Observations/Objective: Patient sounds cheerful and well on the phone. I do not appreciate any SOB. Speech and thought processing are grossly intact. Patient reported vitals:  Assessment and Plan: Arthritis of right acromioclavicular joint  -R shoulder xray 01/28/19 with Degenerative changes. -pt advised to d/c Mobic given elevated creatinine. -continue Voltaren gel and Tylenol arthritis prn. - Plan: Ambulatory referral to Orthopedic Surgery, Ambulatory referral to Physical Therapy  De Quervain's tenosynovitis  - Plan: Ambulatory referral to Orthopedic Surgery, Ambulatory referral to Physical Therapy  Pain  in both hands  - Plan: Ambulatory referral to Orthopedic Surgery   Follow Up Instructions: F/u prn   I did not refer this patient for an OV in the next 24 hours for this/these issue(s).  I discussed the assessment and treatment plan with the patient. The patient was provided an opportunity to ask questions and all were answered. The patient agreed with the plan and demonstrated an understanding of the instructions.   The patient was advised to call back or seek an in-person evaluation if the symptoms worsen or if the condition fails to improve as anticipated.  I provided 26 minutes of non-face-to-face time during this encounter.   Clifford Ruddy, MD

## 2019-05-04 NOTE — Telephone Encounter (Signed)
Pt was scheduled for visit  

## 2019-05-06 ENCOUNTER — Ambulatory Visit: Payer: Self-pay

## 2019-05-06 ENCOUNTER — Ambulatory Visit (INDEPENDENT_AMBULATORY_CARE_PROVIDER_SITE_OTHER): Payer: BC Managed Care – PPO | Admitting: Family Medicine

## 2019-05-06 ENCOUNTER — Encounter: Payer: Self-pay | Admitting: Family Medicine

## 2019-05-06 DIAGNOSIS — G8929 Other chronic pain: Secondary | ICD-10-CM

## 2019-05-06 DIAGNOSIS — M25511 Pain in right shoulder: Secondary | ICD-10-CM | POA: Diagnosis not present

## 2019-05-06 DIAGNOSIS — M25531 Pain in right wrist: Secondary | ICD-10-CM | POA: Diagnosis not present

## 2019-05-06 MED ORDER — TRAMADOL HCL 50 MG PO TABS: 50.0000 mg | ORAL_TABLET | Freq: Four times a day (QID) | ORAL | 0 refills | Status: DC | PRN

## 2019-05-06 NOTE — Progress Notes (Signed)
Office Visit Note   Patient: Clifford Kramer           Date of Birth: Sep 14, 1965           MRN: [id number] Visit Date: 05/06/2019 Requested by: [name], MD [place],  [place] [address] PCP: [name], MD  Subjective: Chief Complaint  Patient presents with  . Right Shoulder - Pain    Right shoulder pain - got better with a prednisone taper, but returned right after. Was told he had OA in the [facility] joint (7/20 by PCP).  . pain bil hands and rt wrist, decreased mobility in wrist    HPI: He is here with right shoulder and wrist pain.  Pain in the shoulder for the past couple months.  He had x-rays obtained by his PCP which show AC joint and glenohumeral osteoarthritis.  I viewed them today myself.  He was given a prednisone taper about a month ago which gave him very good relief, but then pain came back a couple weeks later.  He is wondering whether a cortisone injection might help.  Pain seems to be mostly on the lateral aspect of the shoulder.  His right wrist has been getting stiff over the past year.  He works at [employer] and recently he was lifting something heavy and felt a pop in his wrist, it has been very painful since then and he is unable to dorsiflex it adequately.  He is right-hand dominant.              ROS: No fevers or chills.  He does have a history of gout which is well controlled.  All other systems were reviewed and are negative.  Objective: Vital Signs: There were no vitals taken for this visit.  Physical Exam:  General:  Alert and oriented, in no acute distress. Pulm:  Breathing unlabored. Psy:  Normal mood, congruent affect. Skin: No erythema or rash. Right shoulder: Full active range of motion, pain with empty can test.  Slightly tender in the posterior subacromial space and moderately tender in the lateral subacromial space.  Minimal tenderness at the [facility] joint today.  He is also tender anteriorly on the glenohumeral joint. Right  wrist: Dorsiflexion is limited to about 10 degrees compared to about 50 degrees on the left.  He is tender to palpation across the dorsum of his wrist especially near the scapholunate joint.  No effusion and no warmth.   Imaging: X-rays right wrist: He has widening of the scapholunate interval.  I question whether he might have a transverse fracture across the capitate bone.  Otherwise some mild DJD in his wrist.  No chondrocalcinosis seen.   Assessment & Plan: 1.  Right shoulder pain with DJD, possible impingement. -Discussed options with him and elected to inject the subacromial space today.  States did not give adequate relief, could do a glenohumeral injection instead.  2.  Right wrist pain, concerning for scapholunate ligament tear and possible capitate fracture. -MRI to evaluate.     Procedures: Right shoulder subacromial injection: After sterile prep with Betadine, injected 5 cc 1% lidocaine without epinephrine and 40 mg methylprednisolone from lateral approach into subacromial space.    PMFS History: Patient Active Problem List   Diagnosis Date Noted  . 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 Routine general medical examination at a health care facility 10/01/2017  . 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 Type II diabetes mellitus, uncontrolled ([place]) 03/04/2013  . Dyslipidemia (high  LDL; low HDL) 03/04/2013  . Gout 08/17/2010  . Obstructive sleep apnea 12/07/2009  . 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 ERECTILE DYSFUNCTION 03/07/2007  . Essential hypertension 03/07/2007  . ALLERGIC RHINITIS 03/07/2007   Past Medical History:  Diagnosis Date  . Diabetes mellitus   . 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 Gout   . Hyperlipidemia   . Hypertension   . Sleep apnea    CPAP use     Family History  Problem Relation Age of Onset  . Diabetes Mother   . Stomach cancer Other   . Diabetes Maternal Aunt   . Diabetes Maternal Grandmother   . Heart disease Neg Hx   . Colon cancer Neg  Hx   . Esophageal cancer Neg Hx   . Rectal cancer Neg Hx     Past Surgical History:  Procedure Laterality Date  . COLONOSCOPY  2015  . KNEE SURGERY Left 2005   Social History   Occupational History  . Not on file  Tobacco Use  . Smoking status: Never Smoker  . Smokeless tobacco: Never Used  Substance and Sexual Activity  . Alcohol use: Not Currently    Comment: rare beer  . Drug use: Not Currently  . Sexual activity: Not Currently

## 2019-05-09 ENCOUNTER — Other Ambulatory Visit: Payer: Self-pay | Admitting: Endocrinology

## 2019-05-11 ENCOUNTER — Ambulatory Visit: Payer: BC Managed Care – PPO | Attending: Family Medicine | Admitting: Physical Therapy

## 2019-05-11 ENCOUNTER — Encounter: Payer: Self-pay | Admitting: Physical Therapy

## 2019-05-11 ENCOUNTER — Other Ambulatory Visit: Payer: Self-pay

## 2019-05-11 DIAGNOSIS — M62838 Other muscle spasm: Secondary | ICD-10-CM | POA: Diagnosis present

## 2019-05-11 DIAGNOSIS — M25531 Pain in right wrist: Secondary | ICD-10-CM | POA: Diagnosis present

## 2019-05-11 DIAGNOSIS — M25611 Stiffness of right shoulder, not elsewhere classified: Secondary | ICD-10-CM

## 2019-05-11 DIAGNOSIS — M25511 Pain in right shoulder: Secondary | ICD-10-CM | POA: Diagnosis present

## 2019-05-11 DIAGNOSIS — M25631 Stiffness of right wrist, not elsewhere classified: Secondary | ICD-10-CM | POA: Diagnosis present

## 2019-05-11 DIAGNOSIS — G8929 Other chronic pain: Secondary | ICD-10-CM | POA: Insufficient documentation

## 2019-05-11 NOTE — Therapy (Signed)
[facility] [address], [place], [place], [address] Phone: [phone]   Fax:  [phone]  Physical Therapy Evaluation  Patient Details  Name: Clifford Kramer MRN: [id number] Date of Birth: 03/13/1966 Referring Provider (PT): [name], MD   Encounter Date: 05/11/2019  PT End of Session - 05/11/19 1625    Visit Number  1    Date for PT Re-Evaluation  07/06/19    PT Start Time  [id number]    PT Stop Time  1700    PT Time Calculation (min)  44 min    Activity Tolerance  Patient tolerated treatment well    Behavior During Therapy  [facility] for tasks assessed/performed       Past Medical History:  Diagnosis Date  . Diabetes mellitus   . 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 Gout   . Hyperlipidemia   . Hypertension   . Sleep apnea    CPAP use     Past Surgical History:  Procedure Laterality Date  . COLONOSCOPY  2015  . KNEE SURGERY Left 2005    There were no vitals filed for this visit.   Subjective Assessment - 05/11/19 1620    Subjective  Pt is here with main complaint of shoulder pain but secondary Rt wrist pain.  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  Cannot reach back behind him.    Currently in Pain?  Yes    Pain Score  3    doing aggravating things, was 10/10 before the shot   Pain Location  Shoulder    Pain Orientation  Right;Posterior;Anterior    Pain Descriptors / Indicators  Sharp    Pain Type  Chronic pain    Pain Onset  More than a month ago    Pain Frequency  Intermittent    Aggravating Factors   reaching back and circles    Pain Relieving Factors  resting with arm out to the side a little and have to move around    Effect of Pain on Daily Activities  work    Multiple Pain Sites  No         [facility] PT Assessment - 05/11/19 0001      Assessment   Medical Diagnosis  M19.011 (ICD-10-CM) - Arthritis of  right acromioclavicular joint; M65.4 (ICD-10-CM) - De Quervain's tenosynovitis    Referring Provider (PT)  [name], MD    Prior Therapy  No      Precautions   Precautions  None      Restrictions   Weight Bearing Restrictions  No      Balance Screen   Has the patient fallen in the past 6 months  No      ROM / Strength   AROM / PROM / Strength  AROM;Strength      AROM   AROM Assessment Site  Shoulder    Right/Left Shoulder  Right;Left    Right Shoulder Flexion  144 Degrees    Right Shoulder ABduction  142 Degrees    Left Shoulder Flexion  170 Degrees    Left Shoulder ABduction  170 Degrees      Strength   Strength Assessment Site  Shoulder    Right/Left Shoulder  Right;Left                Objective measurements completed  on examination: See above findings.              PT Education - 05/11/19 1712    Education Details  Access Code: [email or web address] URL: [email or web address] Date: 05/11/2019 Prepared by: Jari Favre  Exercises Supine Shoulder Flexion Extension AAROM with Dowel - 10 reps - 3 sets - 1x daily - 7x weekly Open Book Chest Rotation Stretch on Foam 1/2 Roll - 10 reps - 3 sets - 1x daily - 7x weekly Patient Education .Trigger Point Dry Needling.Office Posture    Person(s) Educated  Patient    Methods  Explanation;Demonstration;Handout;Verbal cues;Tactile cues    Comprehension  Verbalized understanding;Returned demonstration                  Plan - 05/11/19 1714    Stability/Clinical Decision Making  Evolving/Moderate complexity    Clinical Decision Making  Moderate    Rehab Potential  Excellent    PT Frequency  2x / week    PT Duration  8 weeks    PT Treatment/Interventions  ADLs/Self Care Home Management    PT Next Visit Plan  dry needling anterior deltoid, wrist flex and ext    PT Home Exercise Plan  Access Code: [email or web address]    Consulted and Agree with Plan of Care  Patient       Patient will benefit  from skilled therapeutic intervention in order to improve the following deficits and impairments:     Visit Diagnosis: Chronic right shoulder pain  Stiffness of right shoulder, not elsewhere classified  Other muscle spasm  Pain in right wrist  Stiffness of right wrist, not elsewhere classified     Problem List Patient Active Problem List   Diagnosis Date Noted  . 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 Routine general medical examination at a health care facility 10/01/2017  . 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 Type II diabetes mellitus, uncontrolled ([place]) 03/04/2013  . Dyslipidemia (high LDL; low HDL) 03/04/2013  . Gout 08/17/2010  . Obstructive sleep apnea 12/07/2009  . 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 ERECTILE DYSFUNCTION 03/07/2007  . Essential hypertension 03/07/2007  . ALLERGIC RHINITIS 03/07/2007    Camillo Flaming Devarius Nelles 05/11/2019, 5:16 PM  [facility] [address] [place], [place], [address] Phone: [phone]   Fax:  [phone]  Name: Clifford Kramer MRN: [id number] Date of Birth: 11-Jun-1966

## 2019-05-11 NOTE — Patient Instructions (Signed)
Access Code: [email or web address]  URL: [email or web address]  Date: 05/11/2019  Prepared by: Jari Favre   Exercises  Supine Shoulder Flexion Extension AAROM with Dowel - 10 reps - 3 sets - 1x daily - 7x weekly  Open Book Chest Rotation Stretch on Foam 1/2 Roll - 10 reps - 3 sets - 1x daily - 7x weekly  Patient Education  Trigger Point Dry Needling  Office Posture

## 2019-05-13 ENCOUNTER — Ambulatory Visit: Payer: BC Managed Care – PPO

## 2019-05-16 ENCOUNTER — Other Ambulatory Visit: Payer: Self-pay | Admitting: Family Medicine

## 2019-05-25 ENCOUNTER — Ambulatory Visit: Payer: BC Managed Care – PPO | Admitting: Physical Therapy

## 2019-05-25 ENCOUNTER — Encounter: Payer: Self-pay | Admitting: Physical Therapy

## 2019-05-25 ENCOUNTER — Other Ambulatory Visit (INDEPENDENT_AMBULATORY_CARE_PROVIDER_SITE_OTHER): Payer: BC Managed Care – PPO

## 2019-05-25 ENCOUNTER — Other Ambulatory Visit: Payer: Self-pay

## 2019-05-25 DIAGNOSIS — M25511 Pain in right shoulder: Secondary | ICD-10-CM | POA: Diagnosis not present

## 2019-05-25 DIAGNOSIS — E291 Testicular hypofunction: Secondary | ICD-10-CM

## 2019-05-25 DIAGNOSIS — E1165 Type 2 diabetes mellitus with hyperglycemia: Secondary | ICD-10-CM | POA: Diagnosis not present

## 2019-05-25 DIAGNOSIS — M25611 Stiffness of right shoulder, not elsewhere classified: Secondary | ICD-10-CM

## 2019-05-25 DIAGNOSIS — M25531 Pain in right wrist: Secondary | ICD-10-CM

## 2019-05-25 DIAGNOSIS — M25631 Stiffness of right wrist, not elsewhere classified: Secondary | ICD-10-CM

## 2019-05-25 DIAGNOSIS — M62838 Other muscle spasm: Secondary | ICD-10-CM

## 2019-05-25 DIAGNOSIS — G8929 Other chronic pain: Secondary | ICD-10-CM

## 2019-05-25 LAB — CBC
HCT: 44.4 % (ref 39.0–52.0)
Hemoglobin: 14.7 g/dL (ref 13.0–17.0)
MCHC: 33.2 g/dL (ref 30.0–36.0)
MCV: 76.6 fl — ABNORMAL LOW (ref 78.0–100.0)
Platelets: 276 10*3/uL (ref 150.0–400.0)
RBC: 5.8 Mil/uL (ref 4.22–5.81)
RDW: 15.9 % — ABNORMAL HIGH (ref 11.5–15.5)
WBC: 7.3 10*3/uL (ref 4.0–10.5)

## 2019-05-25 LAB — BASIC METABOLIC PANEL
BUN: 18 mg/dL (ref 6–23)
CO2: 25 mEq/L (ref 19–32)
Calcium: 9.5 mg/dL (ref 8.4–10.5)
Chloride: 104 mEq/L (ref 96–112)
Creatinine, Ser: 1.3 mg/dL (ref 0.40–1.50)
GFR: 69.27 mL/min (ref >60.00)
Glucose, Bld: 148 mg/dL — ABNORMAL HIGH (ref 70–99)
Potassium: 4.2 mEq/L (ref 3.5–5.1)
Sodium: 137 mEq/L (ref 135–145)

## 2019-05-25 LAB — HEMOGLOBIN A1C: Hgb A1c MFr Bld: 7 % — ABNORMAL HIGH (ref 4.6–6.5)

## 2019-05-25 LAB — TESTOSTERONE: Testosterone: 306.44 ng/dL (ref 300.00–890.00)

## 2019-05-25 NOTE — Therapy (Signed)
[facility] [address] [place], [place], [address] Phone: [phone]   Fax:  [phone]  Physical Therapy Treatment  Patient Details  Name: Clifford Kramer MRN: [id number] Date of Birth: 02/16/1966 Referring Provider (PT): [name], MD   Encounter Date: 05/25/2019  PT End of Session - 05/25/19 1046    Visit Number  1    PT Start Time  1022   pt arrived late   PT Stop Time  1100    PT Time Calculation (min)  38 min    Activity Tolerance  Patient tolerated treatment well    Behavior During Therapy  [facility] for tasks assessed/performed       Past Medical History:  Diagnosis Date  . Diabetes mellitus   . 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 Gout   . Hyperlipidemia   . Hypertension   . Sleep apnea    CPAP use     Past Surgical History:  Procedure Laterality Date  . COLONOSCOPY  2015  . KNEE SURGERY Left 2005    There were no vitals filed for this visit.  Subjective Assessment - 05/25/19 1025    Subjective  The right shoulder is a dull pain.  The stretches and posture changes are helping though.    Currently in Pain?  Yes    Pain Score  2     Pain Location  Shoulder    Pain Orientation  Right;Posterior    Pain Descriptors / Indicators  Dull;Aching    Pain Type  Chronic pain    Pain Onset  More than a month ago    Pain Frequency  Intermittent    Multiple Pain Sites  No                       [facility] Adult PT Treatment/Exercise - 05/25/19 0001      Exercises   Exercises  Shoulder      Shoulder Exercises: Standing   Horizontal ABduction  Strengthening;Both;10 reps    External Rotation  Strengthening;Both;20 reps;Theraband   red band   Flexion  Strengthening;Both;10 reps;Weights    Shoulder Flexion Weight (lbs)  2    ABduction  Strengthening;Both;10 reps;Weights    Shoulder ABduction Weight (lbs)  2    Extension  Strengthening;Both;20 reps;Theraband    Theraband Level  (Shoulder Extension)  Level 2 (Red)    Diagonals  Strengthening;Both;20 reps;Theraband    Theraband Level (Shoulder Diagonals)  Level 2 (Red)      Shoulder Exercises: Pulleys   Flexion  3 minutes   PT present for update     Manual Therapy   Manual Therapy  Soft tissue mobilization;Joint mobilization    Joint Mobilization  wrist ext mobs grade I-II    Soft tissue mobilization  wrist extensors               PT Short Term Goals - 05/12/19 1802      PT SHORT TERM GOAL #1   Title  Pt will demonstrate Rt sholder flexion of at least 150 deg    Time  4    Period  Weeks    Status  New    Target Date  06/08/19      PT SHORT TERM GOAL #2   Title  Pt will report 25% less pain    Time  4    Period  Weeks    Status  New    Target Date  06/08/19        PT Long Term Goals - 05/12/19 1800      PT LONG TERM GOAL #1   Title  ind with advanced HEP    Time  8    Period  Weeks    Status  New    Target Date  07/06/19      PT LONG TERM GOAL #2   Title  Pt will report at least 60% less pain during the work day    Time  8    Period  Weeks    Status  New    Target Date  07/06/19      PT LONG TERM GOAL #3   Title  Pt will demonstrate Rt shoulder extension and flexion equal to Lt for return to functional activities    Time  8    Period  Weeks    Status  New    Target Date  07/06/19      PT LONG TERM GOAL #4   Title  Pt will improve wrist ROM for Rt and Lt wrist to demonstrate equal extension and flexion    Baseline  Rt wrist 50% limited    Time  8    Period  Weeks    Status  New    Target Date  07/06/19            Plan - 05/25/19 1102    Clinical Impression Statement  Pt did well with initial HEP and education.  He was able to perform exercises today with dues for posture and no increased pain.  Pt only one treatment since eval and will benefit from skilled PT to continue with strengthening    Comorbidities  chronic pain, multiple body regions, repetitive tasks at  work    PT Treatment/Interventions  ADLs/Self Care Home Management;Biofeedback;Cryotherapy;Electrical Stimulation;Iontophoresis 4mg /ml Dexamethasone;Moist Heat;Traction;Therapeutic activities;Therapeutic exercise;Neuromuscular re-education;Patient/family education;Manual techniques;Passive range of motion;Dry needling;Taping    PT Next Visit Plan  progress shoulder and posture strength, dry needling to wrist extensors and f/u about doctors appointment for wrist    PT Home Exercise Plan  Access Code: [email or web address]    Consulted and Agree with Plan of Care  Patient       Patient will benefit from skilled therapeutic intervention in order to improve the following deficits and impairments:  Pain, Improper body mechanics, Increased fascial restricitons, Increased muscle spasms, Postural dysfunction, Decreased range of motion, Decreased strength, Impaired UE functional use, Impaired flexibility  Visit Diagnosis: Chronic right shoulder pain  Stiffness of right shoulder, not elsewhere classified  Other muscle spasm  Pain in right wrist  Stiffness of right wrist, not elsewhere classified     Problem List Patient Active Problem List   Diagnosis Date Noted  . 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 Routine general medical examination at a health care facility 10/01/2017  . 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 Type II diabetes mellitus, uncontrolled ([place]) 03/04/2013  . Dyslipidemia (high LDL; low HDL) 03/04/2013  . Gout 08/17/2010  . Obstructive sleep apnea 12/07/2009  . 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 ERECTILE DYSFUNCTION 03/07/2007  . Essential hypertension 03/07/2007  . ALLERGIC RHINITIS 03/07/2007    Jule Ser, PT 05/25/2019, 12:34 PM  [facility] [address] [place], [place], [address] Phone: [phone]   Fax:  [phone]  Name: Clifford Kramer MRN: [id number] Date of Birth: [birth year]

## 2019-05-28 ENCOUNTER — Ambulatory Visit: Payer: BC Managed Care – PPO | Admitting: Physical Therapy

## 2019-05-30 ENCOUNTER — Ambulatory Visit
Admission: RE | Admit: 2019-05-30 | Discharge: 2019-05-30 | Disposition: A | Discharge status: Home / Self Care | Payer: BC Managed Care – PPO | Source: Ambulatory Visit | Attending: Family Medicine | Admitting: Family Medicine

## 2019-05-30 ENCOUNTER — Other Ambulatory Visit: Payer: Self-pay

## 2019-05-30 DIAGNOSIS — M25531 Pain in right wrist: Secondary | ICD-10-CM

## 2019-06-01 ENCOUNTER — Encounter: Payer: Self-pay | Admitting: Endocrinology

## 2019-06-01 ENCOUNTER — Ambulatory Visit: Payer: BC Managed Care – PPO | Attending: Family Medicine | Admitting: Physical Therapy

## 2019-06-01 ENCOUNTER — Telehealth: Payer: Self-pay | Admitting: Family Medicine

## 2019-06-01 ENCOUNTER — Other Ambulatory Visit: Payer: Self-pay

## 2019-06-01 ENCOUNTER — Encounter: Payer: Self-pay | Admitting: Physical Therapy

## 2019-06-01 ENCOUNTER — Ambulatory Visit: Payer: BC Managed Care – PPO | Admitting: Endocrinology

## 2019-06-01 VITALS — BP 110/80 | HR 115 | Ht 70.0 in | Wt 268.6 lb

## 2019-06-01 DIAGNOSIS — E1165 Type 2 diabetes mellitus with hyperglycemia: Secondary | ICD-10-CM

## 2019-06-01 DIAGNOSIS — M25631 Stiffness of right wrist, not elsewhere classified: Secondary | ICD-10-CM | POA: Diagnosis present

## 2019-06-01 DIAGNOSIS — E790 Hyperuricemia without signs of inflammatory arthritis and tophaceous disease: Secondary | ICD-10-CM

## 2019-06-01 DIAGNOSIS — M25611 Stiffness of right shoulder, not elsewhere classified: Secondary | ICD-10-CM | POA: Insufficient documentation

## 2019-06-01 DIAGNOSIS — M25531 Pain in right wrist: Secondary | ICD-10-CM | POA: Diagnosis present

## 2019-06-01 DIAGNOSIS — E291 Testicular hypofunction: Secondary | ICD-10-CM | POA: Diagnosis not present

## 2019-06-01 DIAGNOSIS — G8929 Other chronic pain: Secondary | ICD-10-CM

## 2019-06-01 DIAGNOSIS — M62838 Other muscle spasm: Secondary | ICD-10-CM | POA: Diagnosis present

## 2019-06-01 DIAGNOSIS — I10 Essential (primary) hypertension: Secondary | ICD-10-CM

## 2019-06-01 DIAGNOSIS — M25511 Pain in right shoulder: Secondary | ICD-10-CM | POA: Diagnosis present

## 2019-06-01 NOTE — Patient Instructions (Signed)
Rx [employer].

## 2019-06-01 NOTE — Telephone Encounter (Signed)
I called the patient and advised him of his MRI results. He will come in tomorrow morning to see me for labwork.

## 2019-06-01 NOTE — Progress Notes (Signed)
Patient ID: Clifford Kramer, male   DOB: 09-23-1965, 53 y.o.   MRN: [id number]     Reason for Appointment : Endocrinology follow-up  History of Present Illness          Diagnosis: Type 2 diabetes mellitus, date of diagnosis: 2005     Background information: He was probably started on metformin at the time of diagnosis when his blood sugars were not very high About 5-6 years ago because of higher blood sugars he was also given glipizide  He appeared to have  progression of his diabetes with A1c going up to 8.1% in 01/2013; also had difficulty with losing weight before he was started on Victoza.  With this his blood sugars were significantly better His glipizide was reduced to 2.5 mg in 10/14 because of rare hypoglycemia    RECENT history:   Antihyperglycemic drugs : Ozempic 1 mg weekly, and Amaryl 1 mg pcs, Synjardy XR 12.11/998, 2 tablets daily   Work routine: He starts work at [employer] AM for [employer]  Current management, blood sugar patterns and problems:  His A1c is consistently improved at 7 although previously has been as low as 6.4  He has brought his monitor for download today   He still seems to have slightly higher fasting readings although mostly when he is waking up at 2 AM to go to work  Occasionally may have higher readings after breakfast also and has had these a couple of times  Has been regular with taking his Ozempic weekly on his medications  He is now trying to do some walking on his own at least 3-4 times a week and is planning to get an elliptical at home  His weight is about the same  As before is usually trying to make good choices when planning his meals and eating out   Side effects from medications have been: None       Monitors blood glucose: once a day or twice          Glucometer:      Accu-Chek  Blood Glucose readings from recall   PRE-MEAL Fasting Lunch Dinner Bedtime Overall  Glucose range: 118-150  115   115-215  Mean/median:      ?   POST-MEAL PC Breakfast PC Lunch PC Dinner  Glucose range:   130  130-154  Mean/median:      Previous readings:  PRE-MEAL Fasting Lunch Dinner Bedtime Overall  Glucose range:  119-151      Mean/median:      140   POST-MEAL PC Breakfast PC Lunch PC Dinner  Glucose range:  123-212  135-177  136-184  Mean/median:   155      LIFESTYLE: Meals: 3 meals per day. Dinner 5 pm, Bfst 9 am; lunch 2 pm;  eating out periodically  His breakfast:  eggs and toast or meat  Usually eating fruit and sandwich at lunch, usually a grilled meat at suppertime   Physical activity: exercise: 3/7 days a week walking, 45 min      Dietician visit: 05/2013           Wt Readings from Last 3 Encounters:  06/01/19 268 lb 9.6 oz (121.8 kg)  02/23/19 267 lb 12.8 oz (121.5 kg)  01/28/19 266 lb (120.7 kg)   Lab Results  Component Value Date   HGBA1C 7.0 (H) 05/25/2019   HGBA1C 7.1 (H) 02/16/2019   HGBA1C 7.5 (H) 11/10/2018   Lab Results  Component Value Date  MICROALBUR <0.7 08/11/2018   LDLCALC 51 01/29/2019   CREATININE 1.30 05/25/2019    Other active problems including new problems are in review of systems    No visits with results within 1 Week(s) from this visit.  Latest known visit with results is:  Lab on 05/25/2019  Component Date Value Ref Range Status  . WBC 05/25/2019 7.3  4.0 - 10.5 K/uL Final  . RBC 05/25/2019 5.80  4.22 - 5.81 Mil/uL Final  . Platelets 05/25/2019 276.0  150.0 - 400.0 K/uL Final  . Hemoglobin 05/25/2019 14.7  13.0 - 17.0 g/dL Final  . HCT 05/25/2019 44.4  39.0 - 52.0 % Final  . MCV 05/25/2019 76.6* 78.0 - 100.0 fl Final  . MCHC 05/25/2019 33.2  30.0 - 36.0 g/dL Final  . RDW 05/25/2019 15.9* 11.5 - 15.5 % Final  . Testosterone 05/25/2019 306.44  300.00 - 890.00 ng/dL Final  . Sodium 05/25/2019 137  135 - 145 mEq/L Final  . Potassium 05/25/2019 4.2  3.5 - 5.1 mEq/L Final  . Chloride 05/25/2019 104  96 - 112 mEq/L Final  . CO2 05/25/2019 25  19 - 32 mEq/L Final   . Glucose, Bld 05/25/2019 148* 70 - 99 mg/dL Final  . BUN 05/25/2019 18  6 - 23 mg/dL Final  . Creatinine, Ser 05/25/2019 1.30  0.40 - 1.50 mg/dL Final  . Calcium 05/25/2019 9.5  8.4 - 10.5 mg/dL Final  . GFR 05/25/2019 69.27  >60.00 mL/min Final  . Hgb A1c MFr Bld 05/25/2019 7.0* 4.6 - 6.5 % Final   Glycemic Control Guidelines for People with Diabetes:Non Diabetic:  <6%Goal of Therapy: <7%Additional Action Suggested:  >8%     Allergies as of 06/01/2019      Reactions   Sulfa Antibiotics Itching   REACTION: itching   Celebrex [celecoxib] Itching   REACTION: itching   Penicillins Itching   Sulfonamide Derivatives Itching   REACTION: itching      Medication List       Accurate as of June 01, 2019  4:03 PM. If you have any questions, ask your nurse or doctor.        STOP taking these medications   predniSONE 10 MG tablet Commonly known as: DELTASONE Stopped by: Elayne Snare, MD   scopolamine 1 MG/3DAYS Commonly known as: Transderm-Scop (1.5 MG) Stopped by: Elayne Snare, MD     TAKE these medications   allopurinol 300 MG tablet Commonly known as: ZYLOPRIM TAKE 1 TABLET BY MOUTH EVERY DAY   amLODipine 5 MG tablet Commonly known as: NORVASC TAKE 1 TABLET BY MOUTH EVERY DAY   Androderm 4 MG/24HR Pt24 patch Generic drug: testosterone PLACE 2 PATCHES ONTO THE SKIN DAILY   aspirin 81 MG tablet Take 81 mg by mouth daily.   BD Pen Needle Nano U/F 32G X 4 MM Misc Generic drug: Insulin Pen Needle USE EVERY DAY   fluticasone 50 MCG/ACT nasal spray Commonly known as: FLONASE PLACE 2 SPRAYS INTO THE NOSE DAILY.   glimepiride 1 MG tablet Commonly known as: AMARYL TAKE 1 TABLET (1 MG TOTAL) BY MOUTH DAILY WITH BREAKFAST.   glucose blood test strip Commonly known as: Accu-Chek Guide Use as instructed   ONE TOUCH ULTRA TEST test strip Generic drug: glucose blood USE AS INSTRUCTED TO CHECK BLOOD SUGAR TWO TIMES A DAY DX CODE E11.9   lisinopril-hydrochlorothiazide  20-12.5 MG tablet Commonly known as: Zestoretic Take 1 tablet by mouth daily.   meloxicam 15 MG tablet Commonly known as:  MOBIC Take 15 mg by mouth daily.   omeprazole 40 MG capsule Commonly known as: PRILOSEC Take 1 capsule (40 mg total) by mouth daily.   OneTouch Delica Lancets Fine Misc Use to check blood sugar 2 times per day, dx code E11.9   Accu-Chek FastClix Lancets Misc Use to check blood sugar 3 times daily. Dx Code E11.9   OneTouch Verio IQ System w/Device Kit Use to check blood sugar 2 times daily   Ozempic (1 MG/DOSE) 2 MG/1.5ML Sopn Generic drug: Semaglutide (1 MG/DOSE) INJECT 1 MG INTO THE SKIN ONCE A WEEK.   simvastatin 20 MG tablet Commonly known as: ZOCOR TAKE 1 TABLET AT BEDTIME   Synjardy XR 12.11-998 MG Tb24 Generic drug: Empagliflozin-metFORMIN HCl ER TAKE 2 TABLETS BY MOUTH DAILY WITH BREAKFAST.   traMADol 50 MG tablet Commonly known as: ULTRAM Take 1 tablet (50 mg total) by mouth every 6 (six) hours as needed.   Viagra 100 MG tablet Generic drug: sildenafil       Allergies:  Allergies  Allergen Reactions  . Sulfa Antibiotics Itching    REACTION: itching  . Celebrex [Celecoxib] Itching    REACTION: itching  . Penicillins Itching  . Sulfonamide Derivatives Itching    REACTION: itching    Past Medical History:  Diagnosis Date  . Diabetes mellitus   . 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 Gout   . Hyperlipidemia   . Hypertension   . Sleep apnea    CPAP use     Past Surgical History:  Procedure Laterality Date  . COLONOSCOPY  2015  . KNEE SURGERY Left 2005    Family History  Problem Relation Age of Onset  . Diabetes Mother   . Stomach cancer Other   . Diabetes Maternal Aunt   . Diabetes Maternal Grandmother   . Heart disease Neg Hx   . Colon cancer Neg Hx   . Esophageal cancer Neg Hx   . Rectal cancer Neg Hx     Social History:  reports that he has never smoked. He has never used smokeless tobacco. He reports  previous alcohol use. He reports previous drug use.    Review of Systems    HYPERTENSION: This is well controlled with Norvasc 5 mg and  20 mg lisinopril HCTZ was stopped with using SGLT 2 drugs and because of hyperuricemia However his home blood pressure was higher and this was started back  Home readings have been about 118/80-85   BP Readings from Last 3 Encounters:  06/01/19 110/80  02/23/19 110/62  02/09/19 112/71   Creatinine slightly variable, he was told to continue meloxicam in half and creatinine is slightly better:  Lab Results  Component Value Date   CREATININE 1.30 05/25/2019   CREATININE 1.38 02/16/2019   CREATININE 1.27 01/29/2019     GOUT: He has had history of recurrent episodes of probable gout in various joints including knee and elbow in the past Baseline uric acid was 8.4 done in 2012  Taking allopurinol 300 mg daily regularly with last uric acid 3.3      Hyperlipidemia: LDL has been controlled, baseline previously 109, has been taking simvastatin 20 mg. Has a low HDL  Lab Results  Component Value Date   CHOL 96 01/29/2019   HDL 33.70 (L) 01/29/2019   LDLCALC 51 01/29/2019   TRIG 60.0 01/29/2019   CHOLHDL 3 01/29/2019       HYPOGONADISM:  He has been on testosterone supplements since 2011 when his level  was mildly low around 300  but no further evaluation done.   He has a relatively low testosterone level while using Axiron and had difficulty with the application With Androderm patches he had improved testosterone levels and less fatigue  His testosterone level has been consistent with using 8 mg Androderm but it is relatively lower than before in the normal range now Does not complain of any unusual fatigue He is now saying that patches tend to cause some irritation and make the skin peel and he has to find different places to apply it   Lab Results  Component Value Date   TESTOSTERONE 306.44 05/25/2019    He takes Viagra for  erectile dysfunction  LABS:  No visits with results within 1 Week(s) from this visit.  Latest known visit with results is:  Lab on 05/25/2019  Component Date Value Ref Range Status  . WBC 05/25/2019 7.3  4.0 - 10.5 K/uL Final  . RBC 05/25/2019 5.80  4.22 - 5.81 Mil/uL Final  . Platelets 05/25/2019 276.0  150.0 - 400.0 K/uL Final  . Hemoglobin 05/25/2019 14.7  13.0 - 17.0 g/dL Final  . HCT 05/25/2019 44.4  39.0 - 52.0 % Final  . MCV 05/25/2019 76.6* 78.0 - 100.0 fl Final  . MCHC 05/25/2019 33.2  30.0 - 36.0 g/dL Final  . RDW 05/25/2019 15.9* 11.5 - 15.5 % Final  . Testosterone 05/25/2019 306.44  300.00 - 890.00 ng/dL Final  . Sodium 05/25/2019 137  135 - 145 mEq/L Final  . Potassium 05/25/2019 4.2  3.5 - 5.1 mEq/L Final  . Chloride 05/25/2019 104  96 - 112 mEq/L Final  . CO2 05/25/2019 25  19 - 32 mEq/L Final  . Glucose, Bld 05/25/2019 148* 70 - 99 mg/dL Final  . BUN 05/25/2019 18  6 - 23 mg/dL Final  . Creatinine, Ser 05/25/2019 1.30  0.40 - 1.50 mg/dL Final  . Calcium 05/25/2019 9.5  8.4 - 10.5 mg/dL Final  . GFR 05/25/2019 69.27  >60.00 mL/min Final  . Hgb A1c MFr Bld 05/25/2019 7.0* 4.6 - 6.5 % Final   Glycemic Control Guidelines for People with Diabetes:Non Diabetic:  <6%Goal of Therapy: <7%Additional Action Suggested:  >8%      Physical Examination:   BP 110/80 (BP Location: Left Arm, Patient Position: Sitting, Cuff Size: Large)   Pulse (!) 115   Ht '5\' 10"'  (1.778 m)   Wt 268 lb 9.6 oz (121.8 kg)   SpO2 97%   BMI 38.54 kg/m         ASSESSMENT/PLAN:   Diabetes type 2, with obesity, BMI 39  See history of present illness for detailed discussion of his current management, blood sugar patterns and problems identified  His A1c is improved at 7  His blood sugars are fairly good by recall at home Unable to download his meter He is trying to increase his exercise However still not losing weight Usually trying to watch his diet also He will continue the same  regimen for now To monitor readings after meals consistently  HYPERTENSION:  His blood pressure is well controlled with current regimen including half tablet of amlodipine now  Gout: Controlled with allopurinol  Chronic use of nonsteroidal drugs: He is trying to take meloxicam 3 days a week only and creatinine is slightly better  Hypogonadism:   He is on Androderm 8 mg and getting skin irritation with using 2 patches We will try to get insurance coverage for Gaynelle Cage He can get a co-pay  card online  Follow-up in 3 months    There are no Patient Instructions on file for this visit.  Total visit time for evaluation and management of multiple problems and counseling =25 minutes   Elayne Snare 06/01/2019, 4:03 PM

## 2019-06-01 NOTE — Therapy (Signed)
[facility] [address] [place], [place], [address] Phone: [phone]   Fax:  [phone]  Physical Therapy Treatment  Patient Details  Name: Clifford Kramer MRN: [id number] Date of Birth: [birth year] Referring Provider (PT): [name], MD   Encounter Date: 06/01/2019  PT End of Session - 06/01/19 1208    Visit Number  3    Date for PT Re-Evaluation  07/06/19    Authorization Type  BCS    PT Start Time  1145    PT Stop Time  1225    PT Time Calculation (min)  40 min    Activity Tolerance  Patient tolerated treatment well    Behavior During Therapy  [facility] for tasks assessed/performed       Past Medical History:  Diagnosis Date  . Diabetes mellitus   . 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 Gout   . Hyperlipidemia   . Hypertension   . Sleep apnea    CPAP use     Past Surgical History:  Procedure Laterality Date  . COLONOSCOPY  2015  . KNEE SURGERY Left 2005    There were no vitals filed for this visit.  Subjective Assessment - 06/01/19 1152    Subjective  I feel pretty good. I have a little of soreness in the right shoulder. My shoulder pain is 70% better. When I move the right shoulder there is snapping and popping.    Patient Stated Goals  loosen up the right shoulder and manage the pain    Currently in Pain?  Yes    Pain Score  0-No pain    Pain Location  Shoulder    Pain Orientation  Right    Pain Descriptors / Indicators  Aching;Dull    Pain Type  Acute pain    Pain Onset  More than a month ago    Pain Frequency  Intermittent    Aggravating Factors   reaching back and circles    Pain Relieving Factors  resting with arm out to the side a little and have to move around    Effect of Pain on Daily Activities  work    Multiple Pain Sites  No         [facility] PT Assessment - 06/01/19 0001      ROM / Strength   AROM / PROM / Strength  AROM;PROM;Strength      AROM   Right Shoulder Flexion   170 Degrees    Right Shoulder ABduction  170 Degrees      PROM   Right Wrist Extension  55 Degrees    Right Wrist Flexion  65 Degrees                   [facility] Adult PT Treatment/Exercise - 06/01/19 0001      Shoulder Exercises: Supine   Other Supine Exercises  lay on 2 balls along the T1-T5 area to reduce muscle tension interscapular    Other Supine Exercises  A?AROM to right wrist for flexion and extension      Shoulder Exercises: Seated   Flexion  Strengthening;Right;10 reps;Weights   2 sets   Flexion Weight (lbs)  2    Abduction  Strengthening;Right;10 reps   2 sets   ABduction Weight (lbs)  2      Shoulder Exercises: Sidelying   Other Sidelying Exercises  lay on left side and bring right arm and bring across body then do diagnonally  Shoulder Exercises: Pulleys   Flexion  3 minutes   PT present for update   Flexion Limitations  mobilize the T1-T3 to work on right shoulder movement      Shoulder Exercises: ROM/Strengthening   UBE (Upper Arm Bike)  2 minutes forward and 2 minutes backward while assessing patient      Manual Therapy   Manual Therapy  Joint mobilization    Joint Mobilization  P-A mobilization to T1-T3; right wrist extension mobs grade III               PT Short Term Goals - 06/01/19 1225      PT SHORT TERM GOAL #1   Title  Pt will demonstrate Rt sholder flexion of at least 150 deg    Time  4    Period  Weeks    Status  Achieved    Target Date  06/08/19      PT SHORT TERM GOAL #2   Title  Pt will report 25% less pain    Time  4    Period  Weeks    Status  Achieved        PT Long Term Goals - 05/12/19 1800      PT LONG TERM GOAL #1   Title  ind with advanced HEP    Time  8    Period  Weeks    Status  New    Target Date  07/06/19      PT LONG TERM GOAL #2   Title  Pt will report at least 60% less pain during the work day    Time  8    Period  Weeks    Status  New    Target Date  07/06/19      PT LONG TERM  GOAL #3   Title  Pt will demonstrate Rt shoulder extension and flexion equal to Lt for return to functional activities    Time  8    Period  Weeks    Status  New    Target Date  07/06/19      PT LONG TERM GOAL #4   Title  Pt will improve wrist ROM for Rt and Lt wrist to demonstrate equal extension and flexion    Baseline  Rt wrist 50% limited    Time  8    Period  Weeks    Status  New    Target Date  07/06/19            Plan - 06/01/19 1209    Clinical Impression Statement  Patient reports his right shoulder pian is 70% better. Patient has increased in right shoulder A/ROM. Patient right wrist motion is limited by pain. Patient had tightness in the right interscapular area and motion of T1-T4 restricting some motion. Patient will benefit from skilled therapy to continue with strength and improve ROM of shoulder and wrist.    Personal Factors and Comorbidities  Comorbidity 2;Profession    Comorbidities  chronic pain, multiple body regions, repetitive tasks at work    Examination-Activity Limitations  Reach Overhead    Examination-Participation Restrictions  Community Activity;Driving    Stability/Clinical Decision Making  Evolving/Moderate complexity    Rehab Potential  Excellent    PT Frequency  2x / week    PT Duration  8 weeks    PT Treatment/Interventions  ADLs/Self Care Home Management;Biofeedback;Cryotherapy;Electrical Stimulation;Iontophoresis 4mg /ml Dexamethasone;Moist Heat;Traction;Therapeutic activities;Therapeutic exercise;Neuromuscular re-education;Patient/family education;Manual techniques;Passive range of motion;Dry needling;Taping    PT Next  Visit Plan  progress shoulder and posture strength, work on interscapular strength prone, wrist A/AROM ; update HEP    PT Home Exercise Plan  Access Code: [phone]    Recommended Other Services  MD signed initial note    Consulted and Agree with Plan of Care  Patient       Patient will benefit from skilled therapeutic  intervention in order to improve the following deficits and impairments:  Pain, Improper body mechanics, Increased fascial restricitons, Increased muscle spasms, Postural dysfunction, Decreased range of motion, Decreased strength, Impaired UE functional use, Impaired flexibility  Visit Diagnosis: Chronic right shoulder pain  Stiffness of right shoulder, not elsewhere classified  Other muscle spasm  Pain in right wrist  Stiffness of right wrist, not elsewhere classified     Problem List Patient Active Problem List   Diagnosis Date Noted  . 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 Routine general medical examination at a health care facility 10/01/2017  . 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 Type II diabetes mellitus, uncontrolled ([place]) 03/04/2013  . Dyslipidemia (high LDL; low HDL) 03/04/2013  . Gout 08/17/2010  . Obstructive sleep apnea 12/07/2009  . 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 ERECTILE DYSFUNCTION 03/07/2007  . Essential hypertension 03/07/2007  . ALLERGIC RHINITIS 03/07/2007    Earlie Counts, PT 06/01/19 12:27 PM   [facility] [address] [place], [place], [address] Phone: [phone]   Fax:  [phone]  Name: REYAANSH RO MRN: [id number] Date of Birth: Jul 27, 1966

## 2019-06-01 NOTE — Telephone Encounter (Signed)
MRI shows no torn ligaments or bone fractures, but there is a lot of inflammation around the joint.  The pattern is concerning for rheumatoid arthritis.  I recommend drawing some labs/blood tests to further evaluate.

## 2019-06-02 ENCOUNTER — Telehealth: Payer: Self-pay

## 2019-06-02 ENCOUNTER — Ambulatory Visit (INDEPENDENT_AMBULATORY_CARE_PROVIDER_SITE_OTHER): Payer: BC Managed Care – PPO

## 2019-06-02 ENCOUNTER — Other Ambulatory Visit: Payer: Self-pay

## 2019-06-02 DIAGNOSIS — G8929 Other chronic pain: Secondary | ICD-10-CM

## 2019-06-02 DIAGNOSIS — M25531 Pain in right wrist: Secondary | ICD-10-CM

## 2019-06-02 DIAGNOSIS — M25511 Pain in right shoulder: Secondary | ICD-10-CM

## 2019-06-02 MED ORDER — JATENZO 198 MG PO CAPS: 1.0000 | ORAL_CAPSULE | Freq: Two times a day (BID) | ORAL | 3 refills | Status: DC

## 2019-06-02 NOTE — Progress Notes (Signed)
Blood work drawn per Dr.Hilts: ANA, CRP, CCP-antibody, RF, ESR & uric acid. Will notify patient once results are available.

## 2019-06-02 NOTE — Telephone Encounter (Signed)
Testosterone Undecanoate (JATENZO) 198 MG CAPS   Patient called In stating pharmacy told him he needs a PA for this medication    Please call and advise

## 2019-06-02 NOTE — Telephone Encounter (Signed)
Please start the PA.  He is having a skin reaction to Androderm

## 2019-06-03 ENCOUNTER — Telehealth: Payer: Self-pay

## 2019-06-03 ENCOUNTER — Encounter: Payer: Self-pay | Admitting: Physical Therapy

## 2019-06-03 ENCOUNTER — Telehealth: Payer: Self-pay | Admitting: Family Medicine

## 2019-06-03 ENCOUNTER — Ambulatory Visit: Payer: BC Managed Care – PPO | Admitting: Physical Therapy

## 2019-06-03 DIAGNOSIS — M25511 Pain in right shoulder: Secondary | ICD-10-CM

## 2019-06-03 DIAGNOSIS — M25611 Stiffness of right shoulder, not elsewhere classified: Secondary | ICD-10-CM

## 2019-06-03 DIAGNOSIS — M0579 Rheumatoid arthritis with rheumatoid factor of multiple sites without organ or systems involvement: Secondary | ICD-10-CM

## 2019-06-03 DIAGNOSIS — M25631 Stiffness of right wrist, not elsewhere classified: Secondary | ICD-10-CM

## 2019-06-03 DIAGNOSIS — M62838 Other muscle spasm: Secondary | ICD-10-CM

## 2019-06-03 DIAGNOSIS — R768 Other specified abnormal immunological findings in serum: Secondary | ICD-10-CM

## 2019-06-03 DIAGNOSIS — G8929 Other chronic pain: Secondary | ICD-10-CM

## 2019-06-03 DIAGNOSIS — M25531 Pain in right wrist: Secondary | ICD-10-CM

## 2019-06-03 NOTE — Telephone Encounter (Signed)
CCP antibodies are positive.  More evidence for rheumatoid arthritis.

## 2019-06-03 NOTE — Telephone Encounter (Signed)
PA was denied because Gaynelle Cage is a non covered medication on the pt's formulary.  Covered alternatives are  Testosterone Cypionate injection Testosterone Enanthate injection Testosterone Gel (fortesta) Testosterone Gel 25mg /2.5g (Androgel)

## 2019-06-03 NOTE — Telephone Encounter (Signed)
Rheumatoid factor is positive.  This is highly suggestive of rheumatoid arthritis, especially combined with the MRI findings.  I recommend consultation with a rheumatologist, so I will make referral.

## 2019-06-03 NOTE — Telephone Encounter (Signed)
He will continue Androderm, please let him know

## 2019-06-03 NOTE — Telephone Encounter (Signed)
Can you please send this Rx and I will contact the pt?

## 2019-06-03 NOTE — Therapy (Signed)
[facility] [address], [place], [place], [address] Phone: [phone]   Fax:  [phone]  Physical Therapy Treatment  Patient Details  Name: Clifford Kramer MRN: [id number] Date of Birth: [birth year] Referring Provider (PT): [name], MD   Encounter Date: 06/03/2019  PT End of Session - 06/03/19 1102    Visit Number  4    Date for PT Re-Evaluation  07/06/19    Authorization Type  BCS    PT Start Time  1102    PT Stop Time  1143    PT Time Calculation (min)  41 min    Activity Tolerance  Patient tolerated treatment well    Behavior During Therapy  [facility] for tasks assessed/performed       Past Medical History:  Diagnosis Date  . Diabetes mellitus   . 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 Gout   . Hyperlipidemia   . Hypertension   . Sleep apnea    CPAP use     Past Surgical History:  Procedure Laterality Date  . COLONOSCOPY  2015  . KNEE SURGERY Left 2005    There were no vitals filed for this visit.  Subjective Assessment - 06/03/19 1105    Subjective  Just a little sore after last session. I stretched a bit more and it was good.    Currently in Pain?  No/denies    Multiple Pain Sites  No                       [facility] Adult PT Treatment/Exercise - 06/03/19 0001      Shoulder Exercises: Sidelying   Other Sidelying Exercises  Soft foam roll decompression series horizontally to spine      Shoulder Exercises: ROM/Strengthening   UBE (Upper Arm Bike)  L2 3x3 with discussion of current status      Shoulder Exercises: Stretch   Other Shoulder Stretches  Foam roll decompression, shoulder AROM/mobiization, interscapular mobilization.    Roll in vertical & horizontal positions              PT Short Term Goals - 06/01/19 1225      PT SHORT TERM GOAL #1   Title  Pt will demonstrate Rt sholder flexion of at least 150 deg    Time  4    Period  Weeks    Status  Achieved    Target Date  06/08/19      PT SHORT TERM GOAL #2   Title  Pt will report 25% less pain    Time  4    Period  Weeks    Status  Achieved        PT Long Term Goals - 05/12/19 1800      PT LONG TERM GOAL #1   Title  ind with advanced HEP    Time  8    Period  Weeks    Status  New    Target Date  07/06/19      PT LONG TERM GOAL #2   Title  Pt will report at least 60% less pain during the work day    Time  8    Period  Weeks    Status  New    Target Date  07/06/19      PT LONG TERM GOAL #3   Title  Pt will demonstrate Rt shoulder extension and flexion equal to Lt for return to functional activities  Time  8    Period  Weeks    Status  New    Target Date  07/06/19      PT LONG TERM GOAL #4   Title  Pt will improve wrist ROM for Rt and Lt wrist to demonstrate equal extension and flexion    Baseline  Rt wrist 50% limited    Time  8    Period  Weeks    Status  New    Target Date  07/06/19            Plan - 06/03/19 1103    Clinical Impression Statement  Pt reports shoulder is 70%-80% improved since eval. He is currently on vacation so he is able to have a high compliance with his HEP. We discussed strategies to implement when he returns to work next [employer] keep his compliance high. Pt was introduced to the foam roll and different ways to use it at home to help mobiilze his shoulders and upper back.    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    Examination-Participation Restrictions  Community Activity;Driving    Stability/Clinical Decision Making  Evolving/Moderate complexity    Rehab Potential  Excellent    PT Frequency  2x / week    PT Duration  8 weeks    PT Treatment/Interventions  ADLs/Self Care Home Management;Biofeedback;Cryotherapy;Electrical Stimulation;Iontophoresis 4mg /ml Dexamethasone;Moist  Heat;Traction;Therapeutic activities;Therapeutic exercise;Neuromuscular re-education;Patient/family education;Manual techniques;Passive range of motion;Dry needling;Taping    PT Next Visit Plan  progress shoulder and posture strength, work on interscapular strength prone, wrist A/AROM ; update HEP    PT Home Exercise Plan  Access Code: [email or web address]    Consulted and Agree with Plan of Care  Patient       Patient will benefit from skilled therapeutic intervention in order to improve the following deficits and impairments:  Pain, Improper body mechanics, Increased fascial restricitons, Increased muscle spasms, Postural dysfunction, Decreased range of motion, Decreased strength, Impaired UE functional use, Impaired flexibility  Visit Diagnosis: Chronic right shoulder pain  Stiffness of right shoulder, not elsewhere classified  Other muscle spasm  Pain in right wrist  Stiffness of right wrist, not elsewhere classified     Problem List Patient Active Problem List   Diagnosis Date Noted  . 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 Routine general medical examination at a health care facility 10/01/2017  . 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 Type II diabetes mellitus, uncontrolled ([place]) 03/04/2013  . Dyslipidemia (high LDL; low HDL) 03/04/2013  . Gout 08/17/2010  . Obstructive sleep apnea 12/07/2009  . 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 ERECTILE DYSFUNCTION 03/07/2007  . Essential hypertension 03/07/2007  . ALLERGIC RHINITIS 03/07/2007    Kamylah Manzo, PTA 06/03/2019, 11:44 AM  [facility] [address] [place], [place], [address] Phone: [phone]   Fax:  [phone]  Name: Clifford Kramer MRN: [id number] Date of Birth: 03/12/1966

## 2019-06-03 NOTE — Telephone Encounter (Signed)
PA initiated via [email or web address] for Jatenzo 198mg  tabs taking 2 tabs per day. Derrek Luck (Key: [id number]) Jatenzo 198MG  capsules   Form Charity fundraiser PA Form (NCPDP) Created 1 hour ago Sent to Plan 44 minutes ago Plan Response 18 minutes ago Submit Clinical Questions less than a minute ago Determination Wait for Determination Please wait for [employer]NCP[employer] to return a determination.

## 2019-06-04 ENCOUNTER — Other Ambulatory Visit: Payer: Self-pay | Admitting: Endocrinology

## 2019-06-04 ENCOUNTER — Encounter: Payer: Self-pay | Admitting: Family Medicine

## 2019-06-04 LAB — ANA: Anti Nuclear Antibody (ANA): NEGATIVE

## 2019-06-04 LAB — C-REACTIVE PROTEIN: CRP: 3.9 mg/L (ref <8.0)

## 2019-06-04 LAB — CYCLIC CITRUL PEPTIDE ANTIBODY, IGG: Cyclic Citrullin Peptide Ab: >250 UNITS — ABNORMAL HIGH

## 2019-06-04 LAB — SEDIMENTATION RATE: Sed Rate: 9 mm/h (ref 0–20)

## 2019-06-04 LAB — URIC ACID: Uric Acid, Serum: 3.8 mg/dL — ABNORMAL LOW (ref 4.0–8.0)

## 2019-06-04 LAB — RHEUMATOID FACTOR: Rheumatoid fact SerPl-aCnc: 91 IU/mL — ABNORMAL HIGH (ref <14)

## 2019-06-04 MED ORDER — ANDRODERM 4 MG/24HR TD PT24: MEDICATED_PATCH | TRANSDERMAL | 1 refills | Status: DC

## 2019-06-04 NOTE — Telephone Encounter (Signed)
Prescription has been sent for 30-day with refill

## 2019-06-08 ENCOUNTER — Other Ambulatory Visit: Payer: Self-pay

## 2019-06-08 ENCOUNTER — Ambulatory Visit: Payer: BC Managed Care – PPO | Admitting: Physical Therapy

## 2019-06-08 ENCOUNTER — Encounter: Payer: Self-pay | Admitting: Physical Therapy

## 2019-06-08 DIAGNOSIS — M25511 Pain in right shoulder: Secondary | ICD-10-CM

## 2019-06-08 DIAGNOSIS — M62838 Other muscle spasm: Secondary | ICD-10-CM

## 2019-06-08 DIAGNOSIS — M25631 Stiffness of right wrist, not elsewhere classified: Secondary | ICD-10-CM

## 2019-06-08 DIAGNOSIS — G8929 Other chronic pain: Secondary | ICD-10-CM

## 2019-06-08 DIAGNOSIS — M25611 Stiffness of right shoulder, not elsewhere classified: Secondary | ICD-10-CM

## 2019-06-08 DIAGNOSIS — M25531 Pain in right wrist: Secondary | ICD-10-CM

## 2019-06-08 NOTE — Therapy (Signed)
[facility] [address], [place], [place], [address] Phone: [phone]   Fax:  [phone]  Physical Therapy Treatment  Patient Details  Name: Clifford Kramer MRN: [id number] Date of Birth: 10-Sep-1965 Referring Provider (PT): [name], MD   Encounter Date: 06/08/2019  PT End of Session - 06/08/19 1617    Visit Number  5    Date for PT Re-Evaluation  07/06/19    Authorization Type  BCS    PT Start Time  1617    PT Stop Time  1700    PT Time Calculation (min)  43 min    Activity Tolerance  Patient tolerated treatment well    Behavior During Therapy  [facility] for tasks assessed/performed       Past Medical History:  Diagnosis Date  . Diabetes mellitus   . 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 Gout   . Hyperlipidemia   . Hypertension   . Sleep apnea    CPAP use     Past Surgical History:  Procedure Laterality Date  . COLONOSCOPY  2015  . KNEE SURGERY Left 2005    There were no vitals filed for this visit.  Subjective Assessment - 06/08/19 1621    Subjective  Just a little sore in the front of the Rt shoulder but feels like more muscle.  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    Patient Stated Goals  loosen up the right shoulder and manage the pain    Currently in Pain?  Yes    Pain Score  2     Pain Location  Shoulder    Pain Orientation  Right;Anterior    Pain Descriptors / Indicators  Sore    Pain Type  Chronic pain    Pain Onset  More than a month ago    Pain Frequency  Intermittent    Multiple Pain Sites  No                       [facility] Adult PT Treatment/Exercise - 06/08/19 0001      Shoulder Exercises: Prone   Retraction  Strengthening;Right;20 reps;Weights    Retraction Weight (lbs)  3    Flexion  Strengthening;Right;20 reps    Extension  Strengthening;Left;20 reps;Weights    Extension Weight (lbs)  2    Horizontal ABduction 1  Strengthening;Right;20 reps       Shoulder Exercises: Standing   External Rotation  Strengthening;Right;20 reps;Theraband   45 deg abduction   Extension  Strengthening;Both;20 reps;Theraband;Weights    Extension Weight (lbs)  20    Row  Strengthening;Both;20 reps;Weights    Row Weight (lbs)  25      Shoulder Exercises: ROM/Strengthening   UBE (Upper Arm Bike)  L2 3x3 with discussion of current status    Other ROM/Strengthening Exercises  rolling pball overhead - 10x 5 sec      standing - red band - horizontal abduction and diagonals - 20x         PT Short Term Goals - 06/01/19 1225      PT SHORT TERM GOAL #1   Title  Pt will demonstrate Rt sholder flexion of at least 150 deg    Time  4    Period  Weeks    Status  Achieved    Target Date  06/08/19      PT SHORT TERM GOAL #2   Title  Pt will report 25% less pain    Time  4    Period  Weeks    Status  Achieved        PT Long Term Goals - 06/08/19 1635      PT LONG TERM GOAL #1   Title  ind with advanced HEP    Status  On-going      PT LONG TERM GOAL #2   Title  Pt will report at least 60% less pain during the work day      PT LONG TERM GOAL #3   Title  Pt will demonstrate Rt shoulder extension and flexion equal to Lt for return to functional activities    Baseline  Rt 150; Lt 165 - no pain in the Rt today    Status  On-going      PT LONG TERM GOAL #4   Title  Pt will improve wrist ROM for Rt and Lt wrist to demonstrate equal extension and flexion    Baseline  Rt wrist 48 deg; Lt 70 deg            Plan - 06/08/19 1710    Clinical Impression Statement  Pt has been doing well with his HEP.  He felt no problems at work today which was his first day back after 2 week off.  Pt progressed exercises as seen in chart.  Pt will benefit from skilled PT to ensure maximum function at work and that he is able to continue to work without pain or limitations.    Comorbidities  chronic pain, multiple body regions, repetitive tasks at work    PT  Treatment/Interventions  ADLs/Self Care Home Management;Biofeedback;Cryotherapy;Electrical Stimulation;Iontophoresis 4mg /ml Dexamethasone;Moist Heat;Traction;Therapeutic activities;Therapeutic exercise;Neuromuscular re-education;Patient/family education;Manual techniques;Passive range of motion;Dry needling;Taping    PT Next Visit Plan  progress shoulder and posture strength, work on interscapular strength, wrist A/AROM ; update HEP    PT Home Exercise Plan  Access Code: [email or web address]    Consulted and Agree with Plan of Care  Patient       Patient will benefit from skilled therapeutic intervention in order to improve the following deficits and impairments:  Pain, Improper body mechanics, Increased fascial restricitons, Increased muscle spasms, Postural dysfunction, Decreased range of motion, Decreased strength, Impaired UE functional use, Impaired flexibility  Visit Diagnosis: Chronic right shoulder pain  Stiffness of right shoulder, not elsewhere classified  Other muscle spasm  Pain in right wrist  Stiffness of right wrist, not elsewhere classified     Problem List Patient Active Problem List   Diagnosis Date Noted  . 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 Routine general medical examination at a health care facility 10/01/2017  . 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 Type II diabetes mellitus, uncontrolled ([place]) 03/04/2013  . Dyslipidemia (high LDL; low HDL) 03/04/2013  . Gout 08/17/2010  . Obstructive sleep apnea 12/07/2009  . 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 ERECTILE DYSFUNCTION 03/07/2007  . Essential hypertension 03/07/2007  . ALLERGIC RHINITIS 03/07/2007    Clifford Kramer, PT 06/08/2019, 5:14 PM  [facility] [address] [place], [place], [address] Phone: [phone]   Fax:  [phone]  Name: Clifford Kramer MRN: [id number] Date of Birth: 24-Sep-1965

## 2019-06-09 ENCOUNTER — Telehealth: Payer: Self-pay | Admitting: Family Medicine

## 2019-06-09 DIAGNOSIS — M0579 Rheumatoid arthritis with rheumatoid factor of multiple sites without organ or systems involvement: Secondary | ICD-10-CM

## 2019-06-09 DIAGNOSIS — R768 Other specified abnormal immunological findings in serum: Secondary | ICD-10-CM

## 2019-06-09 NOTE — Telephone Encounter (Signed)
I called and left a message on the patient's voice mail that a new referral has been placed. He should await a call from [facility].

## 2019-06-09 NOTE — Telephone Encounter (Signed)
Patient called requesting a referral to [facility].  CB#[phone].  Thank you.

## 2019-06-09 NOTE — Telephone Encounter (Signed)
He was referred to Dr. Estanislado Pandy, initially.

## 2019-06-09 NOTE — Telephone Encounter (Signed)
Ordered

## 2019-06-11 ENCOUNTER — Ambulatory Visit: Payer: BC Managed Care – PPO | Admitting: Physical Therapy

## 2019-06-15 ENCOUNTER — Other Ambulatory Visit: Payer: Self-pay

## 2019-06-15 ENCOUNTER — Ambulatory Visit: Payer: BC Managed Care – PPO

## 2019-06-15 DIAGNOSIS — M25631 Stiffness of right wrist, not elsewhere classified: Secondary | ICD-10-CM

## 2019-06-15 DIAGNOSIS — M25611 Stiffness of right shoulder, not elsewhere classified: Secondary | ICD-10-CM

## 2019-06-15 DIAGNOSIS — G8929 Other chronic pain: Secondary | ICD-10-CM

## 2019-06-15 DIAGNOSIS — M25511 Pain in right shoulder: Secondary | ICD-10-CM | POA: Diagnosis not present

## 2019-06-15 DIAGNOSIS — M25531 Pain in right wrist: Secondary | ICD-10-CM

## 2019-06-15 DIAGNOSIS — M62838 Other muscle spasm: Secondary | ICD-10-CM

## 2019-06-15 NOTE — Therapy (Signed)
[facility] [address] [place], [place], [address] Phone: [phone]   Fax:  [phone]  Physical Therapy Treatment  Patient Details  Name: Clifford Kramer MRN: [id number] Date of Birth: February 24, 1966 Referring Provider (PT): [name], MD   Encounter Date: 06/15/2019  PT End of Session - 06/15/19 1138    Visit Number  6    Date for PT Re-Evaluation  07/06/19    Authorization Type  BCBS    PT Start Time  1100    PT Stop Time  1140    PT Time Calculation (min)  40 min    Activity Tolerance  Patient tolerated treatment well    Behavior During Therapy  [facility] for tasks assessed/performed       Past Medical History:  Diagnosis Date  . Diabetes mellitus   . 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 Gout   . Hyperlipidemia   . Hypertension   . Sleep apnea    CPAP use     Past Surgical History:  Procedure Laterality Date  . COLONOSCOPY  2015  . KNEE SURGERY Left 2005    There were no vitals filed for this visit.  Subjective Assessment - 06/15/19 1105    Subjective  I had a little muscle soreness after last session but not too bad.  No pain since then.    Currently in Pain?  No/denies         [facility] PT Assessment - 06/15/19 0001      Observation/Other Assessments   Focus on Therapeutic Outcomes (FOTO)   19% limitation                   [facility] Adult PT Treatment/Exercise - 06/15/19 0001      Shoulder Exercises: Prone   Retraction  Strengthening;Right;20 reps;Weights    Retraction Weight (lbs)  3    Flexion  Strengthening;Right;20 reps    Extension  Strengthening;Left;20 reps;Weights    Extension Weight (lbs)  2    Horizontal ABduction 1  Strengthening;Right;20 reps;Theraband    Theraband Level (Shoulder Horizontal ABduction 1)  Level 3 (Green)      Shoulder Exercises: Standing   Horizontal ABduction  Strengthening;Both;Theraband;20 reps    Theraband Level (Shoulder Horizontal  ABduction)  Level 3 (Green)    External Rotation  Strengthening;Right;20 reps;Theraband   45 deg abduction   Flexion  Strengthening;Right;10 reps    Shoulder Flexion Weight (lbs)  1    Extension  Strengthening;Both;20 reps;Theraband;Weights    Extension Weight (lbs)  20    Row  Strengthening;Both;20 reps;Weights    Row Weight (lbs)  25    Diagonals  Strengthening;Both;20 reps;Theraband    Theraband Level (Shoulder Diagonals)  Level 3 (Green)      Shoulder Exercises: ROM/Strengthening   UBE (Upper Arm Bike)  L2 3x3 with discussion of current status    Other ROM/Strengthening Exercises  rolling pball overhead - 10x 5 sec               PT Short Term Goals - 06/01/19 1225      PT SHORT TERM GOAL #1   Title  Pt will demonstrate Rt sholder flexion of at least 150 deg    Time  4    Period  Weeks    Status  Achieved    Target Date  06/08/19      PT SHORT TERM GOAL #2   Title  Pt will report 25% less pain  Time  4    Period  Weeks    Status  Achieved        PT Long Term Goals - 06/15/19 1106      PT LONG TERM GOAL #1   Title  ind with advanced HEP    Time  8    Period  Weeks    Status  On-going      PT LONG TERM GOAL #2   Title  Pt will report at least 60% less pain during the work day    Time  8    Period  Weeks    Status  On-going            Plan - 06/15/19 1119    Clinical Impression Statement  Pt is making steady progress with PT.  Pt denies any pain in Rt wrist or shoulder without limitation.  Pt is able to tolerate strength training in the clinic without increased pain.   Pt has been doing well with his HEP.  Pt denies any pain at work and is using a device to reduce the amount of reaching that he needs to do at work.   Pt requires tactile and verbal cues for scapular depression. Pt reports >90% overall improvement in use of Rt Ue since the start of care.  Pt will benefit from skilled PT to ensure maximum function at work and that he is able to  continue to work without pain or limitations.    PT Frequency  2x / week    PT Duration  8 weeks    PT Treatment/Interventions  ADLs/Self Care Home Management;Biofeedback;Cryotherapy;Electrical Stimulation;Iontophoresis 4mg /ml Dexamethasone;Moist Heat;Traction;Therapeutic activities;Therapeutic exercise;Neuromuscular re-education;Patient/family education;Manual techniques;Passive range of motion;Dry needling;Taping    PT Next Visit Plan  progress shoulder and posture strength, work on interscapular strength, wrist A/AROM    PT Home Exercise Plan  Access Code: [email or web address]    Consulted and Agree with Plan of Care  Patient       Patient will benefit from skilled therapeutic intervention in order to improve the following deficits and impairments:  Pain, Improper body mechanics, Increased fascial restricitons, Increased muscle spasms, Postural dysfunction, Decreased range of motion, Decreased strength, Impaired UE functional use, Impaired flexibility  Visit Diagnosis: Stiffness of right shoulder, not elsewhere classified  Chronic right shoulder pain  Other muscle spasm  Pain in right wrist  Stiffness of right wrist, not elsewhere classified     Problem List Patient Active Problem List   Diagnosis Date Noted  . 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 Routine general medical examination at a health care facility 10/01/2017  . 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 Type II diabetes mellitus, uncontrolled ([place]) 03/04/2013  . Dyslipidemia (high LDL; low HDL) 03/04/2013  . Gout 08/17/2010  . Obstructive sleep apnea 12/07/2009  . 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 ERECTILE DYSFUNCTION 03/07/2007  . Essential hypertension 03/07/2007  . ALLERGIC RHINITIS 03/07/2007    Sigurd Sos, PT 06/15/19 11:39 AM  [facility] [address], [place], [place], [address] Phone: [phone]   Fax:  [phone]  Name: BAXLEY HEMP MRN:  [id number] Date of Birth: [birth year]

## 2019-06-17 ENCOUNTER — Other Ambulatory Visit: Payer: Self-pay

## 2019-06-17 ENCOUNTER — Ambulatory Visit: Payer: BC Managed Care – PPO

## 2019-06-17 DIAGNOSIS — M25631 Stiffness of right wrist, not elsewhere classified: Secondary | ICD-10-CM

## 2019-06-17 DIAGNOSIS — M62838 Other muscle spasm: Secondary | ICD-10-CM

## 2019-06-17 DIAGNOSIS — G8929 Other chronic pain: Secondary | ICD-10-CM

## 2019-06-17 DIAGNOSIS — M25611 Stiffness of right shoulder, not elsewhere classified: Secondary | ICD-10-CM

## 2019-06-17 DIAGNOSIS — M25531 Pain in right wrist: Secondary | ICD-10-CM

## 2019-06-17 DIAGNOSIS — M25511 Pain in right shoulder: Secondary | ICD-10-CM | POA: Diagnosis not present

## 2019-06-17 NOTE — Therapy (Signed)
[facility] [address] [place], [place], [address] Phone: [phone]   Fax:  [phone]  Physical Therapy Treatment  Patient Details  Name: Clifford Kramer MRN: [id number] Date of Birth: 11-25-1965 Referring Provider (PT): [name], MD   Encounter Date: 06/17/2019  PT End of Session - 06/17/19 1140    Visit Number  7    Date for PT Re-Evaluation  07/06/19    Authorization Type  BCBS    PT Start Time  1104    PT Stop Time  1142    PT Time Calculation (min)  38 min    Activity Tolerance  Patient tolerated treatment well    Behavior During Therapy  [facility] for tasks assessed/performed       Past Medical History:  Diagnosis Date  . Diabetes mellitus   . 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 Gout   . Hyperlipidemia   . Hypertension   . Sleep apnea    CPAP use     Past Surgical History:  Procedure Laterality Date  . COLONOSCOPY  2015  . KNEE SURGERY Left 2005    There were no vitals filed for this visit.  Subjective Assessment - 06/17/19 1113    Subjective  I'm doing well.  No problem with my shoulder today.    Currently in Pain?  No/denies                       [facility] Adult PT Treatment/Exercise - 06/17/19 0001      Shoulder Exercises: Prone   Retraction  Strengthening;Right;20 reps;Weights    Retraction Weight (lbs)  3    Flexion  Strengthening;Right;20 reps    Extension  Strengthening;Left;20 reps;Weights    Extension Weight (lbs)  2      Shoulder Exercises: Standing   Horizontal ABduction  Strengthening;Both;Theraband;20 reps    Theraband Level (Shoulder Horizontal ABduction)  Level 3 (Green)    External Rotation  Strengthening;Right;20 reps;Theraband   45 deg abduction   Flexion  Strengthening;Right;10 reps    Shoulder Flexion Weight (lbs)  2    Extension  Strengthening;Both;20 reps;Theraband;Weights   3x10   Extension Weight (lbs)  25    Extension Limitations  3x10     Row  Strengthening;Both;20 reps;Weights    Row Weight (lbs)  25    Row Limitations  3x10    Diagonals  Strengthening;Both;20 reps;Theraband    Theraband Level (Shoulder Diagonals)  Level 3 (Green)    Diagonals Limitations  seated scaption 3x10 2#      Shoulder Exercises: ROM/Strengthening   UBE (Upper Arm Bike)  L2 4x4 with discussion of current status    Other ROM/Strengthening Exercises  rolling pball overhead - 10x 5 sec               PT Short Term Goals - 06/01/19 1225      PT SHORT TERM GOAL #1   Title  Pt will demonstrate Rt sholder flexion of at least 150 deg    Time  4    Period  Weeks    Status  Achieved    Target Date  06/08/19      PT SHORT TERM GOAL #2   Title  Pt will report 25% less pain    Time  4    Period  Weeks    Status  Achieved        PT Long Term Goals - 06/15/19 1106  PT LONG TERM GOAL #1   Title  ind with advanced HEP    Time  8    Period  Weeks    Status  On-going      PT LONG TERM GOAL #2   Title  Pt will report at least 60% less pain during the work day    Time  8    Period  Weeks    Status  On-going            Plan - 06/17/19 1119    Clinical Impression Statement  Pt continues to make steady progress with Rt shoulder and wrist pain and function.  Pt denies any pain this week and does not have any increase in pain with exercise in the clinic.  PT provided verbal and tactile cues to reduce scapular elevation and to reduce speed with motion with rows and shoulder extension.  Pt has not been working this week due to being on vacation so not able to assess pain level with pain.  Pt was able to tolerate increased weights, time and reps with exercise today.  Pt will continue to benefit from skilled PT to address Rt shoulder strength, flexibility and endurance.    PT Frequency  2x / week    PT Duration  8 weeks    PT Treatment/Interventions  ADLs/Self Care Home Management;Biofeedback;Cryotherapy;Electrical  Stimulation;Iontophoresis 4mg /ml Dexamethasone;Moist Heat;Traction;Therapeutic activities;Therapeutic exercise;Neuromuscular re-education;Patient/family education;Manual techniques;Passive range of motion;Dry needling;Taping    PT Next Visit Plan  progress shoulder and posture strength, work on interscapular strength    PT Home Exercise Plan  Access Code: [email or web address]    Consulted and Agree with Plan of Care  Patient       Patient will benefit from skilled therapeutic intervention in order to improve the following deficits and impairments:  Pain, Improper body mechanics, Increased fascial restricitons, Increased muscle spasms, Postural dysfunction, Decreased range of motion, Decreased strength, Impaired UE functional use, Impaired flexibility  Visit Diagnosis: Stiffness of right shoulder, not elsewhere classified  Chronic right shoulder pain  Other muscle spasm  Pain in right wrist  Stiffness of right wrist, not elsewhere classified     Problem List Patient Active Problem List   Diagnosis Date Noted  . 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 Routine general medical examination at a health care facility 10/01/2017  . 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 Type II diabetes mellitus, uncontrolled ([place]) 03/04/2013  . Dyslipidemia (high LDL; low HDL) 03/04/2013  . Gout 08/17/2010  . Obstructive sleep apnea 12/07/2009  . 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 ERECTILE DYSFUNCTION 03/07/2007  . Essential hypertension 03/07/2007  . ALLERGIC RHINITIS 03/07/2007    Sigurd Sos, PT 06/17/19 11:41 AM  [facility] [address] [place], [place], [address] Phone: [phone]   Fax:  [phone]  Name: Clifford Kramer MRN: [id number] Date of Birth: January 04, 1966

## 2019-06-22 ENCOUNTER — Ambulatory Visit: Payer: BC Managed Care – PPO | Admitting: Physical Therapy

## 2019-06-22 ENCOUNTER — Other Ambulatory Visit: Payer: Self-pay

## 2019-06-22 DIAGNOSIS — M62838 Other muscle spasm: Secondary | ICD-10-CM

## 2019-06-22 DIAGNOSIS — M25631 Stiffness of right wrist, not elsewhere classified: Secondary | ICD-10-CM

## 2019-06-22 DIAGNOSIS — M25611 Stiffness of right shoulder, not elsewhere classified: Secondary | ICD-10-CM

## 2019-06-22 DIAGNOSIS — M25511 Pain in right shoulder: Secondary | ICD-10-CM

## 2019-06-22 DIAGNOSIS — M25531 Pain in right wrist: Secondary | ICD-10-CM

## 2019-06-22 DIAGNOSIS — G8929 Other chronic pain: Secondary | ICD-10-CM

## 2019-06-22 NOTE — Therapy (Signed)
[facility] [address] [place], [place], [address] Phone: [phone]   Fax:  [phone]  Physical Therapy Treatment  Patient Details  Name: Clifford Kramer MRN: [id number] Date of Birth: 01-09-1966 Referring Provider (PT): Clifford Ruddy, MD   Encounter Date: 06/22/2019  PT End of Session - 06/22/19 1629    Visit Number  8    Date for PT Re-Evaluation  07/06/19    Authorization Type  [employer]    PT Start Time  1620    PT Stop Time  1700    PT Time Calculation (min)  40 min    Activity Tolerance  Patient tolerated treatment well    Behavior During Therapy  Clifford Kramer for tasks assessed/performed       Past Medical History:  Diagnosis Date  . Diabetes mellitus   . 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 Gout   . Hyperlipidemia   . Hypertension   . Sleep apnea    CPAP use     Past Surgical History:  Procedure Laterality Date  . COLONOSCOPY  2015  . KNEE SURGERY Left 2005    There were no vitals filed for this visit.  Subjective Assessment - 06/22/19 1623    Subjective  Pt is doing well, he feels like his pain is 90-95% better and has been staying consistent when doing his HEP    Patient Stated Goals  loosen up the right shoulder and manage the pain    Currently in Pain?  Yes    Pain Score  2     Pain Location  Shoulder    Pain Orientation  Right;Anterior    Pain Descriptors / Indicators  Sore    Pain Type  Chronic pain    Pain Onset  More than a month ago    Pain Frequency  Intermittent    Multiple Pain Sites  No                       [facility] Adult PT Treatment/Exercise - 06/22/19 0001      Shoulder Exercises: Prone   Retraction  Strengthening;Right;20 reps;Weights    Retraction Weight (lbs)  3    Flexion  Strengthening;Right;20 reps    Extension  Strengthening;Left;20 reps;Weights    Extension Weight (lbs)  2      Shoulder Exercises: Standing   Flexion  Strengthening;Right;10 reps    3 sets   Shoulder Flexion Weight (lbs)  2    Extension  Strengthening;Both;20 reps;Theraband;Weights   3x10   Extension Weight (lbs)  25    Extension Limitations  3x10    Row  Strengthening;Both;20 reps;Weights    Row Weight (lbs)  25    Row Limitations  3x10    Diagonals Limitations  seated scaption 3x10 2#      Shoulder Exercises: ROM/Strengthening   UBE (Upper Arm Bike)  L2.1 4x4 with discussion of current status               PT Short Term Goals - 06/01/19 1225      PT SHORT TERM GOAL #1   Title  Pt will demonstrate Rt sholder flexion of at least 150 deg    Time  4    Period  Weeks    Status  Achieved    Target Date  06/08/19      PT SHORT TERM GOAL #2   Title  Pt will report 25% less pain  Time  4    Period  Weeks    Status  Achieved        PT Long Term Goals - 06/22/19 1625      PT LONG TERM GOAL #1   Title  ind with advanced HEP    Status  Achieved      PT LONG TERM GOAL #2   Title  Pt will report at least 60% less pain during the work day    Baseline  95% less pain    Status  Achieved      PT LONG TERM GOAL #3   Title  Pt will demonstrate Rt shoulder extension and flexion equal to Lt for return to functional activities    Baseline  Rt 70; Lt 70 - no increased pain    Status  Achieved      PT LONG TERM GOAL #4   Title  Pt will improve wrist ROM for Rt and Lt wrist to demonstrate equal extension and flexion    Baseline  Rt 48; Lt 75    Status  Not Met            Plan - 06/22/19 1711    Clinical Impression Statement  Pt has met most goals as stated above.  He is ind with his HEP and is recommneded to discharge from skilled PT and continue to do exercises at home.    PT Treatment/Interventions  ADLs/Self Care Home Management;Biofeedback;Cryotherapy;Electrical Stimulation;Iontophoresis 84m/ml Dexamethasone;Moist Heat;Traction;Therapeutic activities;Therapeutic exercise;Neuromuscular re-education;Patient/family education;Manual  techniques;Passive range of motion;Dry needling;Taping    PT Next Visit Plan  d/c today    PT Home Exercise Plan  Access Code: [email or web address]   Consulted and Agree with Plan of Care  Patient       Patient will benefit from skilled therapeutic intervention in order to improve the following deficits and impairments:  Pain, Improper body mechanics, Increased fascial restricitons, Increased muscle spasms, Postural dysfunction, Decreased range of motion, Decreased strength, Impaired UE functional use, Impaired flexibility  Visit Diagnosis: Stiffness of right shoulder, not elsewhere classified  Chronic right shoulder pain  Other muscle spasm  Pain in right wrist  Stiffness of right wrist, not elsewhere classified     Problem List Patient Active Problem List   Diagnosis Date Noted  . 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 Routine general medical examination at a health care facility 10/01/2017  . 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 [place] 03/04/2013  . Dyslipidemia (high LDL; low HDL) 03/04/2013  . Gout 08/17/2010  . Obstructive sleep apnea 12/07/2009  . 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 ERECTILE DYSFUNCTION 03/07/2007  . Essential hypertension 03/07/2007  . ALLERGIC RHINITIS 03/07/2007    Clifford Kramer PT 06/22/2019, 5:14 PM  [facility] [address] [place] [address]: [phone]  Fax:  [phone] Name: Clifford CORNEMRN: [id number] of Birth: 7November 16, 1967 PHYSICAL THERAPY DISCHARGE SUMMARY  Visits from Start of Care: 8  Current functional level related to goals / functional outcomes: See above   Remaining deficits: See above   Education / Equipment: HEP  Plan: Patient agrees to discharge.  Patient goals were not met. Patient is being discharged due to being pleased with the current functional level.  ?????    JAmerican Kramer  PT 06/22/19 5:15 PM

## 2019-06-29 ENCOUNTER — Encounter: Payer: Self-pay | Admitting: Physician Assistant

## 2019-07-05 ENCOUNTER — Other Ambulatory Visit: Payer: Self-pay | Admitting: Family Medicine

## 2019-07-07 ENCOUNTER — Other Ambulatory Visit: Payer: Self-pay

## 2019-07-07 MED ORDER — ACCU-CHEK GUIDE VI STRP: ORAL_STRIP | 12 refills | Status: DC

## 2019-07-14 ENCOUNTER — Other Ambulatory Visit: Payer: Self-pay

## 2019-07-14 MED ORDER — ACCU-CHEK FASTCLIX LANCETS MISC: 3 refills | Status: DC

## 2019-07-14 MED ORDER — ACCU-CHEK GUIDE VI STRP: ORAL_STRIP | 12 refills | Status: DC

## 2019-07-14 MED ORDER — ACCU-CHEK GUIDE W/DEVICE KIT: 1.0000 | PACK | Freq: Three times a day (TID) | 0 refills | Status: DC

## 2019-07-20 ENCOUNTER — Ambulatory Visit: Payer: BC Managed Care – PPO | Admitting: Rheumatology

## 2019-08-07 ENCOUNTER — Other Ambulatory Visit: Payer: Self-pay | Admitting: Endocrinology

## 2019-08-10 ENCOUNTER — Encounter: Payer: Self-pay | Admitting: Family Medicine

## 2019-08-10 ENCOUNTER — Other Ambulatory Visit: Payer: Self-pay

## 2019-08-10 ENCOUNTER — Other Ambulatory Visit: Payer: Self-pay | Admitting: Endocrinology

## 2019-08-10 MED ORDER — FLUTICASONE PROPIONATE 50 MCG/ACT NA SUSP: NASAL | 0 refills | Status: DC

## 2019-08-19 ENCOUNTER — Other Ambulatory Visit: Payer: Self-pay | Admitting: Family Medicine

## 2019-08-25 ENCOUNTER — Ambulatory Visit: Payer: BC Managed Care – PPO | Admitting: Rheumatology

## 2019-09-02 ENCOUNTER — Other Ambulatory Visit: Payer: Self-pay | Admitting: Family Medicine

## 2019-09-14 ENCOUNTER — Encounter: Payer: Self-pay | Admitting: Family Medicine

## 2019-09-22 NOTE — Telephone Encounter (Signed)
Pt is scheduled for a virtual visit with Dr Volanda Napoleon on Friday 09/25/2019

## 2019-09-25 ENCOUNTER — Telehealth (INDEPENDENT_AMBULATORY_CARE_PROVIDER_SITE_OTHER): Payer: BC Managed Care – PPO | Admitting: Family Medicine

## 2019-09-25 DIAGNOSIS — D170 Benign lipomatous neoplasm of skin and subcutaneous tissue of head, face and neck: Secondary | ICD-10-CM | POA: Diagnosis not present

## 2019-09-25 DIAGNOSIS — M791 Myalgia, unspecified site: Secondary | ICD-10-CM | POA: Diagnosis not present

## 2019-09-25 NOTE — Progress Notes (Addendum)
Clifford Kramer on 09/25/19 at  8:00 AM EST   History of Present Illness: Pt is a 54 yo male with pmh sig for HTN, OSA, DM II, lipoma of head, RA.  Pt completed a 12 d prednisone course for RA by the rheumatologist, then started on lefluvonmide 5 mg.  Pt started developing muscle cramps in legs and feet at the end of the taper.  Pt contacted Rheumatologist, advised to increase po intake of water.  Cramps have since resolved, but pt was concerned.  Pt also notes bump on R occipital area has increased in size slightly.  Pt states it is more noticeable when he gets his hair cut.  Area is not painful, pruritic, with erythema or drainage.   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  Also seen by the [place].   Observations/Objective: Patient sounds cheerful and well on the phone. I do not appreciate any SOB. Speech and thought processing are grossly intact. Patient reported vitals: RR   Assessment and Plan: Myalgia -resolved -possibly 2/2 medications, hypokalemia -continue hydration.  Consider teaspoon of yellow mustard. -Pt has labs next with with Endo.  If needed will obtain potassium.  Lipoma of head -last seen in 1 yr ago, slight increase in size -Pt to f/u on Monday for further eval of lipoma.  Will remove in office vs referral to Dermatology.   Follow Up Instructions: F/u on Monday.   I did not refer this patient for an OV in the next 24 hours for this/these issue(s).  I discussed the assessment and treatment plan with the patient. The patient was provided an opportunity to ask questions and all were answered. The patient agreed with the plan and demonstrated an understanding of the instructions.   The patient was advised to call back or seek an in-person evaluation if the symptoms worsen or if the condition fails to improve as anticipated.  I provided 31 minutes of non-face-to-face time during this encounter. More than 50% of over 31 minutes spent in total in caring for this patient  counseling and/or coordinating care.    [name], MD

## 2019-09-28 ENCOUNTER — Ambulatory Visit: Payer: BC Managed Care – PPO | Admitting: Family Medicine

## 2019-09-28 ENCOUNTER — Other Ambulatory Visit: Payer: Self-pay | Admitting: Family Medicine

## 2019-09-28 ENCOUNTER — Other Ambulatory Visit (INDEPENDENT_AMBULATORY_CARE_PROVIDER_SITE_OTHER): Payer: BC Managed Care – PPO

## 2019-09-28 ENCOUNTER — Encounter: Payer: Self-pay | Admitting: Family Medicine

## 2019-09-28 ENCOUNTER — Other Ambulatory Visit: Payer: Self-pay

## 2019-09-28 DIAGNOSIS — E1165 Type 2 diabetes mellitus with hyperglycemia: Secondary | ICD-10-CM

## 2019-09-28 DIAGNOSIS — E291 Testicular hypofunction: Secondary | ICD-10-CM | POA: Diagnosis not present

## 2019-09-28 DIAGNOSIS — E790 Hyperuricemia without signs of inflammatory arthritis and tophaceous disease: Secondary | ICD-10-CM

## 2019-09-28 NOTE — Progress Notes (Signed)
Update: S:Pt stopped by clinic 09/28/19 for provider to see size of the bump on posterior scalp.  O: Skin-soft, smooth, mobile mass underneath scalp of R occipital area, 4 cm x 4 cm, non tender, without erythema or induration.    A/P: Mass on posterior scalp.  Lipoma vs cyst. Given location and size will send to Derm for removal.  Pt to contact office with name of Dermatologist he would like to see.    Grier Mitts, MD

## 2019-09-29 LAB — CBC
HCT: 47.5 % (ref 39.0–52.0)
Hemoglobin: 15.8 g/dL (ref 13.0–17.0)
MCHC: 33.2 g/dL (ref 30.0–36.0)
MCV: 76.6 fl — ABNORMAL LOW (ref 78.0–100.0)
Platelets: 267 10*3/uL (ref 150.0–400.0)
RBC: 6.21 Mil/uL — ABNORMAL HIGH (ref 4.22–5.81)
RDW: 15.1 % (ref 11.5–15.5)
WBC: 6.4 10*3/uL (ref 4.0–10.5)

## 2019-09-29 LAB — COMPREHENSIVE METABOLIC PANEL
ALT: 25 U/L (ref 0–53)
AST: 20 U/L (ref 0–37)
Albumin: 4 g/dL (ref 3.5–5.2)
Alkaline Phosphatase: 86 U/L (ref 39–117)
BUN: 14 mg/dL (ref 6–23)
CO2: 26 mEq/L (ref 19–32)
Calcium: 9.5 mg/dL (ref 8.4–10.5)
Chloride: 102 mEq/L (ref 96–112)
Creatinine, Ser: 1.29 mg/dL (ref 0.40–1.50)
GFR: 69.8 mL/min (ref >60.00)
Glucose, Bld: 163 mg/dL — ABNORMAL HIGH (ref 70–99)
Potassium: 4.1 mEq/L (ref 3.5–5.1)
Sodium: 136 mEq/L (ref 135–145)
Total Bilirubin: 0.4 mg/dL (ref 0.2–1.2)
Total Protein: 7.4 g/dL (ref 6.0–8.3)

## 2019-09-29 LAB — MICROALBUMIN / CREATININE URINE RATIO
Creatinine,U: 95.1 mg/dL
Microalb Creat Ratio: 0.7 mg/g (ref 0.0–30.0)
Microalb, Ur: <0.7 mg/dL (ref 0.0–1.9)

## 2019-09-29 LAB — URINALYSIS, ROUTINE W REFLEX MICROSCOPIC
Bilirubin Urine: NEGATIVE
Hgb urine dipstick: NEGATIVE
Ketones, ur: NEGATIVE
Leukocytes,Ua: NEGATIVE
Nitrite: NEGATIVE
RBC / HPF: NONE SEEN (ref 0–?)
Specific Gravity, Urine: 1.015 (ref 1.000–1.030)
Total Protein, Urine: NEGATIVE
Urine Glucose: >=1000 — AB
Urobilinogen, UA: 0.2 (ref 0.0–1.0)
WBC, UA: NONE SEEN (ref 0–?)
pH: 6 (ref 5.0–8.0)

## 2019-09-29 LAB — TESTOSTERONE: Testosterone: 709.55 ng/dL (ref 300.00–890.00)

## 2019-09-29 LAB — HEMOGLOBIN A1C: Hgb A1c MFr Bld: 7.9 % — ABNORMAL HIGH (ref 4.6–6.5)

## 2019-09-29 LAB — URIC ACID: Uric Acid, Serum: 3.6 mg/dL — ABNORMAL LOW (ref 4.0–7.8)

## 2019-09-30 ENCOUNTER — Other Ambulatory Visit: Payer: Self-pay | Admitting: Endocrinology

## 2019-10-02 ENCOUNTER — Other Ambulatory Visit: Payer: Self-pay | Admitting: Family Medicine

## 2019-10-02 ENCOUNTER — Ambulatory Visit: Payer: BC Managed Care – PPO | Admitting: Endocrinology

## 2019-10-02 DIAGNOSIS — D170 Benign lipomatous neoplasm of skin and subcutaneous tissue of head, face and neck: Secondary | ICD-10-CM

## 2019-10-05 ENCOUNTER — Ambulatory Visit (INDEPENDENT_AMBULATORY_CARE_PROVIDER_SITE_OTHER): Payer: BC Managed Care – PPO | Admitting: Endocrinology

## 2019-10-05 ENCOUNTER — Other Ambulatory Visit: Payer: Self-pay

## 2019-10-05 ENCOUNTER — Encounter: Payer: Self-pay | Admitting: Endocrinology

## 2019-10-05 DIAGNOSIS — E785 Hyperlipidemia, unspecified: Secondary | ICD-10-CM

## 2019-10-05 DIAGNOSIS — E291 Testicular hypofunction: Secondary | ICD-10-CM | POA: Diagnosis not present

## 2019-10-05 DIAGNOSIS — E1165 Type 2 diabetes mellitus with hyperglycemia: Secondary | ICD-10-CM | POA: Diagnosis not present

## 2019-10-05 DIAGNOSIS — I10 Essential (primary) hypertension: Secondary | ICD-10-CM

## 2019-10-05 NOTE — Addendum Note (Signed)
Addended by: Grier Mitts R on: 10/05/2019 05:27 PM   Modules accepted: Level of Service

## 2019-10-05 NOTE — Progress Notes (Signed)
Patient ID: Clifford Kramer, male   DOB: [birth year], 54 y.o.   MRN: [id number]   I connected with the above-named patient by video enabled telemedicine application and verified that I am speaking with the correct person. The patient was explained the limitations of evaluation and management by telemedicine and the availability of in person appointments.  Patient also understood that there may be a patient responsible charge related to this service . Location of the patient: Patient's home . Location of the provider: Physician office Only the patient and myself were participating in the encounter The patient understood the above statements and agreed to proceed.   Reason for Appointment : Endocrinology follow-up  History of Present Illness          Diagnosis: Type 2 diabetes mellitus, date of diagnosis: 2005     Background information: He was probably started on metformin at the time of diagnosis when his blood sugars were not very high About 5-6 years ago because of higher blood sugars he was also given glipizide  He appeared to have  progression of his diabetes with A1c going up to 8.1% in 01/2013; also had difficulty with losing weight before he was started on Victoza.  With this his blood sugars were significantly better His glipizide was reduced to 2.5 mg in 10/14 because of rare hypoglycemia    RECENT history:   Antihyperglycemic drugs : Ozempic 1 mg weekly, and Amaryl 1 mg pcs, Synjardy XR 12.11/998, 2 tablets daily at 9 am   Work routine: He starts work at [employer] AM for [employer]  Current management, blood sugar patterns and problems:  His A1c is higher than usual at 7.9, previously was improved at 7  Has been as low as 6.4  He has brought his monitor for download today   He has had higher sugars likely to be from getting periodic doses of prednisone for his rheumatoid arthritis over the last few weeks  He last had prednisone about 3 weeks ago and blood sugar was up to  244 with this  Although his blood sugars have been improving the last few days and as low as 110 fasting they may be still higher postprandially  He is not always restricting carbohydrates and fats and sometimes eating breakfast sandwiches  He has not missed any doses of Ozempic or his oral medication  However he takes his SYNJARDY at 9 AM instead of at breakfast time in the morning  Because of his joint pains he has had a regular exercise regimen and only now is able to start walking  Not clear if he has gained any weight  He still tries to eat healthy meals in the evenings   Side effects from medications have been: None       Monitors blood glucose: once a day or twice          Glucometer:      Accu-Chek  Blood Glucose readings from recall and download  Average blood sugar 178 blood sugars through 3/1  PRE-MEAL Fasting Lunch Dinner Bedtime Overall  Glucose range:  110-154   127, 148    Mean/median:        POST-MEAL PC Breakfast PC Lunch PC Dinner  Glucose range:  133-190  170-244   Mean/median:       PREVIOUS readings:  PRE-MEAL Fasting Lunch Dinner Bedtime Overall  Glucose range: 118-150  115   115-215  Mean/median:      ?   POST-MEAL PC Breakfast  PC Lunch PC Dinner  Glucose range:   130  130-154  Mean/median:        LIFESTYLE: Meals: 3 meals per day. Dinner 5 pm, Bfst 9 am; lunch 2 pm;  eating out periodically  His breakfast:  eggs and toast or meat, sometimes fast food sandwiches Usually eating fruit and sandwich at lunch, usually a grilled meat at suppertime   Physical activity: exercise:0-4/7 days a week walking, 45 min      Dietician visit: 05/2013           Wt Readings from Last 3 Encounters:  06/01/19 268 lb 9.6 oz (121.8 kg)  02/23/19 267 lb 12.8 oz (121.5 kg)  01/28/19 266 lb (120.7 kg)   Lab Results  Component Value Date   HGBA1C 7.9 (H) 09/28/2019   HGBA1C 7.0 (H) 05/25/2019   HGBA1C 7.1 (H) 02/16/2019   Lab Results  Component Value Date    MICROALBUR <0.7 09/28/2019   LDLCALC 51 01/29/2019   CREATININE 1.29 09/28/2019    Other active problems including new problems are in review of systems    No visits with results within 1 Week(s) from this visit.  Latest known visit with results is:  Lab on 09/28/2019  Component Date Value Ref Range Status  . Testosterone 09/28/2019 709.55  300.00 - 890.00 ng/dL Final  . Uric Acid, Serum 09/28/2019 3.6* 4.0 - 7.8 mg/dL Final  . WBC 09/28/2019 6.4  4.0 - 10.5 K/uL Final  . RBC 09/28/2019 6.21* 4.22 - 5.81 Mil/uL Final  . Platelets 09/28/2019 267.0  150.0 - 400.0 K/uL Final  . Hemoglobin 09/28/2019 15.8  13.0 - 17.0 g/dL Final  . HCT 09/28/2019 47.5  39.0 - 52.0 % Final  . MCV 09/28/2019 76.6* 78.0 - 100.0 fl Final  . MCHC 09/28/2019 33.2  30.0 - 36.0 g/dL Final  . RDW 09/28/2019 15.1  11.5 - 15.5 % Final  . Color, Urine 09/28/2019 YELLOW  Yellow;Lt. Yellow;Straw;Dark Yellow;Amber;Green;Red;Brown Final  . APPearance 09/28/2019 CLEAR  Clear;Turbid;Slightly Cloudy;Cloudy Final  . Specific Gravity, Urine 09/28/2019 1.015  1.000 - 1.030 Final  . pH 09/28/2019 6.0  5.0 - 8.0 Final  . Total Protein, Urine 09/28/2019 NEGATIVE  Negative Final  . Urine Glucose 09/28/2019 >=1000* Negative Final  . Ketones, ur 09/28/2019 NEGATIVE  Negative Final  . Bilirubin Urine 09/28/2019 NEGATIVE  Negative Final  . Hgb urine dipstick 09/28/2019 NEGATIVE  Negative Final  . Urobilinogen, UA 09/28/2019 0.2  0.0 - 1.0 Final  . Leukocytes,Ua 09/28/2019 NEGATIVE  Negative Final  . Nitrite 09/28/2019 NEGATIVE  Negative Final  . WBC, UA 09/28/2019 none seen  0-2/hpf Final  . RBC / HPF 09/28/2019 none seen  0-2/hpf Final  . Microalb, Ur 09/28/2019 <0.7  0.0 - 1.9 mg/dL Final  . Creatinine,U 09/28/2019 95.1  mg/dL Final  . Microalb Creat Ratio 09/28/2019 0.7  0.0 - 30.0 mg/g Final  . Sodium 09/28/2019 136  135 - 145 mEq/L Final  . Potassium 09/28/2019 4.1  3.5 - 5.1 mEq/L Final  . Chloride 09/28/2019 102  96  - 112 mEq/L Final  . CO2 09/28/2019 26  19 - 32 mEq/L Final  . Glucose, Bld 09/28/2019 163* 70 - 99 mg/dL Final  . BUN 09/28/2019 14  6 - 23 mg/dL Final  . Creatinine, Ser 09/28/2019 1.29  0.40 - 1.50 mg/dL Final  . Total Bilirubin 09/28/2019 0.4  0.2 - 1.2 mg/dL Final  . Alkaline Phosphatase 09/28/2019 86  39 - 117 U/L Final  .  AST 09/28/2019 20  0 - 37 U/L Final  . ALT 09/28/2019 25  0 - 53 U/L Final  . Total Protein 09/28/2019 7.4  6.0 - 8.3 g/dL Final  . Albumin 09/28/2019 4.0  3.5 - 5.2 g/dL Final  . GFR 09/28/2019 69.80  >60.00 mL/min Final  . Calcium 09/28/2019 9.5  8.4 - 10.5 mg/dL Final  . Hgb A1c MFr Bld 09/28/2019 7.9* 4.6 - 6.5 % Final   Glycemic Control Guidelines for People with Diabetes:Non Diabetic:  <6%Goal of Therapy: <7%Additional Action Suggested:  >8%     Allergies as of 10/05/2019      Reactions   Sulfa Antibiotics Itching   REACTION: itching   Celebrex [celecoxib] Itching   REACTION: itching   Penicillins Itching   Sulfonamide Derivatives Itching   REACTION: itching      Medication List       Accurate as of October 05, 2019  3:31 PM. If you have any questions, ask your nurse or doctor.        STOP taking these medications   Jatenzo 198 MG Caps Generic drug: Testosterone Undecanoate Stopped by: Elayne Snare, MD     TAKE these medications   Accu-Chek FastClix Lancets Misc Use to check blood sugar 3 times daily. Dx Code E11.9   Accu-Chek Guide test strip Generic drug: glucose blood Use Accu chek guide test strips as instructed to check blood sugar three times daily. DX:E11.9   Accu-Chek Guide w/Device Kit 1 each by Does not apply route 3 (three) times daily. Use accu chek guide device to check blood sugar twice daily. DX:E11.9   allopurinol 300 MG tablet Commonly known as: ZYLOPRIM TAKE 1 TABLET BY MOUTH EVERY DAY   amLODipine 5 MG tablet Commonly known as: NORVASC TAKE 1 TABLET BY MOUTH EVERY DAY   Androderm 4 MG/24HR Pt24 patch Generic drug:  testosterone PLACE 2 PATCHES ONTO THE SKIN DAILY   aspirin 81 MG tablet Take 81 mg by mouth daily.   BD Pen Needle Nano U/F 32G X 4 MM Misc Generic drug: Insulin Pen Needle USE EVERY DAY   fluticasone 50 MCG/ACT nasal spray Commonly known as: FLONASE PLACE 2 SPRAYS INTO THE NOSE DAILY.   glimepiride 1 MG tablet Commonly known as: AMARYL TAKE 1 TABLET (1 MG TOTAL) BY MOUTH DAILY WITH BREAKFAST.   lisinopril-hydrochlorothiazide 20-12.5 MG tablet Commonly known as: Zestoretic Take 1 tablet by mouth daily.   meloxicam 15 MG tablet Commonly known as: MOBIC Take 15 mg by mouth daily.   omeprazole 40 MG capsule Commonly known as: PRILOSEC TAKE 1 CAPSULE BY MOUTH EVERY DAY   Ozempic (1 MG/DOSE) 2 MG/1.5ML Sopn Generic drug: Semaglutide (1 MG/DOSE) INJECT 1 MG INTO THE SKIN ONCE A WEEK.   simvastatin 20 MG tablet Commonly known as: ZOCOR TAKE 1 TABLET AT BEDTIME   Synjardy XR 12.11-998 MG Tb24 Generic drug: Empagliflozin-metFORMIN HCl ER TAKE 2 TABLETS BY MOUTH DAILY WITH BREAKFAST.   traMADol 50 MG tablet Commonly known as: ULTRAM Take 1 tablet (50 mg total) by mouth every 6 (six) hours as needed.   Viagra 100 MG tablet Generic drug: sildenafil       Allergies:  Allergies  Allergen Reactions  . Sulfa Antibiotics Itching    REACTION: itching  . Celebrex [Celecoxib] Itching    REACTION: itching  . Penicillins Itching  . Sulfonamide Derivatives Itching    REACTION: itching    Past Medical History:  Diagnosis Date  . Diabetes mellitus   .  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 Gout   . Hyperlipidemia   . Hypertension   . Sleep apnea    CPAP use     Past Surgical History:  Procedure Laterality Date  . COLONOSCOPY  2015  . KNEE SURGERY Left 2005    Family History  Problem Relation Age of Onset  . Diabetes Mother   . Stomach cancer Other   . Diabetes Maternal Aunt   . Diabetes Maternal Grandmother   . Heart disease Neg Hx   . Colon cancer Neg Hx    . Esophageal cancer Neg Hx   . Rectal cancer Neg Hx     Social History:  reports that he has never smoked. He has never used smokeless tobacco. He reports previous alcohol use. He reports previous drug use.    Review of Systems    HYPERTENSION: This is well controlled with Norvasc 5 mg and  20 mg lisinopril HCTZ  He will check blood pressure at home also periodically and has not had high readings   BP Readings from Last 3 Encounters:  06/01/19 110/80  02/23/19 110/62  02/09/19 112/71   Creatinine recently stable He has been told to reduce meloxicam:  Lab Results  Component Value Date   CREATININE 1.29 09/28/2019   CREATININE 1.30 05/25/2019   CREATININE 1.38 02/16/2019     GOUT: He has had history of recurrent episodes of probable gout in various joints including knee and elbow in the past Baseline uric acid was 8.4 done in 2012  Taking allopurinol 300 mg daily regularly with last uric acid 3.3      Hyperlipidemia: LDL has been controlled, baseline previously 109, has been taking simvastatin 20 mg. Has a low HDL  Lab Results  Component Value Date   CHOL 96 01/29/2019   HDL 33.70 (L) 01/29/2019   LDLCALC 51 01/29/2019   TRIG 60.0 01/29/2019   CHOLHDL 3 01/29/2019       HYPOGONADISM:  He has been on testosterone supplements since 2011 when his level was mildly low around 300  but no further evaluation done.   He has a relatively low testosterone level while using Axiron and had difficulty with the application With Androderm patches he had improved testosterone levels and less fatigue  His testosterone level has been recently inconsistent with using 8 mg Androderm with low normal level on the last visit and now over 700 He is rotating the sites of his Androderm patches Still has some skin sensitivity with the patches and he sometimes uses legs for application   Lab Results  Component Value Date   TESTOSTERONE 709.55 09/28/2019    He takes Viagra for  erectile dysfunction  LABS:  No visits with results within 1 Week(s) from this visit.  Latest known visit with results is:  Lab on 09/28/2019  Component Date Value Ref Range Status  . Testosterone 09/28/2019 709.55  300.00 - 890.00 ng/dL Final  . Uric Acid, Serum 09/28/2019 3.6* 4.0 - 7.8 mg/dL Final  . WBC 09/28/2019 6.4  4.0 - 10.5 K/uL Final  . RBC 09/28/2019 6.21* 4.22 - 5.81 Mil/uL Final  . Platelets 09/28/2019 267.0  150.0 - 400.0 K/uL Final  . Hemoglobin 09/28/2019 15.8  13.0 - 17.0 g/dL Final  . HCT 09/28/2019 47.5  39.0 - 52.0 % Final  . MCV 09/28/2019 76.6* 78.0 - 100.0 fl Final  . MCHC 09/28/2019 33.2  30.0 - 36.0 g/dL Final  . RDW 09/28/2019 15.1  11.5 - 15.5 %  Final  . Color, Urine 09/28/2019 YELLOW  Yellow;Lt. Yellow;Straw;Dark Yellow;Amber;Green;Red;Brown Final  . APPearance 09/28/2019 CLEAR  Clear;Turbid;Slightly Cloudy;Cloudy Final  . Specific Gravity, Urine 09/28/2019 1.015  1.000 - 1.030 Final  . pH 09/28/2019 6.0  5.0 - 8.0 Final  . Total Protein, Urine 09/28/2019 NEGATIVE  Negative Final  . Urine Glucose 09/28/2019 >=1000* Negative Final  . Ketones, ur 09/28/2019 NEGATIVE  Negative Final  . Bilirubin Urine 09/28/2019 NEGATIVE  Negative Final  . Hgb urine dipstick 09/28/2019 NEGATIVE  Negative Final  . Urobilinogen, UA 09/28/2019 0.2  0.0 - 1.0 Final  . Leukocytes,Ua 09/28/2019 NEGATIVE  Negative Final  . Nitrite 09/28/2019 NEGATIVE  Negative Final  . WBC, UA 09/28/2019 none seen  0-2/hpf Final  . RBC / HPF 09/28/2019 none seen  0-2/hpf Final  . Microalb, Ur 09/28/2019 <0.7  0.0 - 1.9 mg/dL Final  . Creatinine,U 09/28/2019 95.1  mg/dL Final  . Microalb Creat Ratio 09/28/2019 0.7  0.0 - 30.0 mg/g Final  . Sodium 09/28/2019 136  135 - 145 mEq/L Final  . Potassium 09/28/2019 4.1  3.5 - 5.1 mEq/L Final  . Chloride 09/28/2019 102  96 - 112 mEq/L Final  . CO2 09/28/2019 26  19 - 32 mEq/L Final  . Glucose, Bld 09/28/2019 163* 70 - 99 mg/dL Final  . BUN 09/28/2019  14  6 - 23 mg/dL Final  . Creatinine, Ser 09/28/2019 1.29  0.40 - 1.50 mg/dL Final  . Total Bilirubin 09/28/2019 0.4  0.2 - 1.2 mg/dL Final  . Alkaline Phosphatase 09/28/2019 86  39 - 117 U/L Final  . AST 09/28/2019 20  0 - 37 U/L Final  . ALT 09/28/2019 25  0 - 53 U/L Final  . Total Protein 09/28/2019 7.4  6.0 - 8.3 g/dL Final  . Albumin 09/28/2019 4.0  3.5 - 5.2 g/dL Final  . GFR 09/28/2019 69.80  >60.00 mL/min Final  . Calcium 09/28/2019 9.5  8.4 - 10.5 mg/dL Final  . Hgb A1c MFr Bld 09/28/2019 7.9* 4.6 - 6.5 % Final   Glycemic Control Guidelines for People with Diabetes:Non Diabetic:  <6%Goal of Therapy: <7%Additional Action Suggested:  >8%      Physical Examination:   There were no vitals taken for this visit.        ASSESSMENT/PLAN:   Diabetes type 2, with obesity  See history of present illness for detailed discussion of his current management, blood sugar patterns and problems identified  His A1c is increased at 7.9, previously was at 7  His blood sugars are going up likely to be from using prednisone periodically and also significantly decreased exercise level  He is starting to walk a little now and hopefully can eventually go to the gym since he is starting his course of the Covid vaccine also He was made aware of improving his diet since he has postprandial hyperglycemia He will try to avoid fast food and if need be and have an Egg McMuffin instead of other fast food sandwiches  Also consistently have low-fat meals at lunch and dinner Needs to check more readings after meals including suppertime For now we will continue his regimen unchanged However he needs to take his SYNJARDY with breakfast rather than later in the morning If he has prednisone again he will go up to at least 2 or 3 tablets of glimepiride until blood sugars come down to near normal  HYPERTENSION:  His blood pressure is reportedly controlled with his drug regimen and he will monitor  regularly at  home  Gout: Controlled with allopurinol, consider using only 100 mg of allopurinol  Mild renal dysfunction: Stable  Hypogonadism:   He is on Androderm 8 mg and not able to get Gaynelle Cage approved through his insurance He is still having some skin irritation with using 2 patches For now since his level has gone up to over 700 we will reduce his dose to 6 mg daily  Follow-up in 3 months    There are no Patient Instructions on file for this visit.     Elayne Snare 10/05/2019, 3:31 PM

## 2019-10-06 MED ORDER — ANDRODERM 2 MG/24HR TD PT24: MEDICATED_PATCH | TRANSDERMAL | 2 refills | Status: DC

## 2019-10-07 ENCOUNTER — Other Ambulatory Visit: Payer: Self-pay

## 2019-10-07 MED ORDER — OZEMPIC (1 MG/DOSE) 2 MG/1.5ML ~~LOC~~ SOPN: 1.0000 mg | PEN_INJECTOR | SUBCUTANEOUS | 1 refills | Status: DC

## 2019-10-13 ENCOUNTER — Other Ambulatory Visit: Payer: Self-pay | Admitting: Family Medicine

## 2019-10-21 ENCOUNTER — Other Ambulatory Visit: Payer: Self-pay | Admitting: Endocrinology

## 2019-10-23 ENCOUNTER — Other Ambulatory Visit: Payer: Self-pay | Admitting: Family Medicine

## 2019-11-14 ENCOUNTER — Other Ambulatory Visit: Payer: Self-pay | Admitting: Family Medicine

## 2019-11-20 ENCOUNTER — Other Ambulatory Visit: Payer: Self-pay | Admitting: Family Medicine

## 2019-12-21 ENCOUNTER — Other Ambulatory Visit: Payer: Self-pay | Admitting: Family Medicine

## 2019-12-24 ENCOUNTER — Other Ambulatory Visit: Payer: Self-pay | Admitting: Endocrinology

## 2020-02-03 ENCOUNTER — Other Ambulatory Visit: Payer: Self-pay | Admitting: Family Medicine

## 2020-02-15 ENCOUNTER — Other Ambulatory Visit (INDEPENDENT_AMBULATORY_CARE_PROVIDER_SITE_OTHER): Payer: BC Managed Care – PPO

## 2020-02-15 ENCOUNTER — Other Ambulatory Visit: Payer: Self-pay

## 2020-02-15 DIAGNOSIS — E1165 Type 2 diabetes mellitus with hyperglycemia: Secondary | ICD-10-CM

## 2020-02-15 DIAGNOSIS — E291 Testicular hypofunction: Secondary | ICD-10-CM | POA: Diagnosis not present

## 2020-02-15 DIAGNOSIS — E785 Hyperlipidemia, unspecified: Secondary | ICD-10-CM

## 2020-02-15 LAB — CBC
HCT: 46.2 % (ref 39.0–52.0)
Hemoglobin: 15.3 g/dL (ref 13.0–17.0)
MCHC: 33 g/dL (ref 30.0–36.0)
MCV: 76.3 fl — ABNORMAL LOW (ref 78.0–100.0)
Platelets: 228 10*3/uL (ref 150.0–400.0)
RBC: 6.06 Mil/uL — ABNORMAL HIGH (ref 4.22–5.81)
RDW: 16.6 % — ABNORMAL HIGH (ref 11.5–15.5)
WBC: 6.7 10*3/uL (ref 4.0–10.5)

## 2020-02-15 LAB — LIPID PANEL
Cholesterol: 100 mg/dL (ref 0–200)
HDL: 31.9 mg/dL — ABNORMAL LOW (ref >39.00)
LDL Cholesterol: 51 mg/dL (ref 0–99)
NonHDL: 68.24
Total CHOL/HDL Ratio: 3
Triglycerides: 84 mg/dL (ref 0.0–149.0)
VLDL: 16.8 mg/dL (ref 0.0–40.0)

## 2020-02-15 LAB — COMPREHENSIVE METABOLIC PANEL
ALT: 25 U/L (ref 0–53)
AST: 20 U/L (ref 0–37)
Albumin: 4.1 g/dL (ref 3.5–5.2)
Alkaline Phosphatase: 79 U/L (ref 39–117)
BUN: 18 mg/dL (ref 6–23)
CO2: 25 mEq/L (ref 19–32)
Calcium: 9.5 mg/dL (ref 8.4–10.5)
Chloride: 89 mEq/L — ABNORMAL LOW (ref 96–112)
Creatinine, Ser: 1.29 mg/dL (ref 0.40–1.50)
GFR: 69.71 mL/min (ref >60.00)
Glucose, Bld: 142 mg/dL — ABNORMAL HIGH (ref 70–99)
Potassium: 4.4 mEq/L (ref 3.5–5.1)
Sodium: 155 mEq/L — ABNORMAL HIGH (ref 135–145)
Total Bilirubin: 0.4 mg/dL (ref 0.2–1.2)
Total Protein: 6.9 g/dL (ref 6.0–8.3)

## 2020-02-15 LAB — TESTOSTERONE: Testosterone: 509.1 ng/dL (ref 300.00–890.00)

## 2020-02-15 LAB — HEMOGLOBIN A1C: Hgb A1c MFr Bld: 7.2 % — ABNORMAL HIGH (ref 4.6–6.5)

## 2020-02-22 ENCOUNTER — Telehealth: Payer: Self-pay | Admitting: Endocrinology

## 2020-02-22 ENCOUNTER — Ambulatory Visit: Payer: BC Managed Care – PPO | Admitting: Endocrinology

## 2020-02-22 NOTE — Telephone Encounter (Signed)
Patient called to reschedule appointment on 02/22/20. Patient had labs done on 02/15/20 and rescheduled his follow up appointment for 03/07/20.

## 2020-02-22 NOTE — Telephone Encounter (Signed)
No additional labs

## 2020-02-22 NOTE — Telephone Encounter (Signed)
Does pt need additional labs?

## 2020-02-23 NOTE — Telephone Encounter (Signed)
Noted  

## 2020-02-29 ENCOUNTER — Encounter: Payer: Self-pay | Admitting: Podiatry

## 2020-02-29 ENCOUNTER — Other Ambulatory Visit: Payer: Self-pay

## 2020-02-29 ENCOUNTER — Ambulatory Visit (INDEPENDENT_AMBULATORY_CARE_PROVIDER_SITE_OTHER): Payer: BC Managed Care – PPO | Admitting: Podiatry

## 2020-02-29 DIAGNOSIS — E119 Type 2 diabetes mellitus without complications: Secondary | ICD-10-CM

## 2020-02-29 DIAGNOSIS — M21611 Bunion of right foot: Secondary | ICD-10-CM

## 2020-02-29 DIAGNOSIS — G5792 Unspecified mononeuropathy of left lower limb: Secondary | ICD-10-CM

## 2020-02-29 DIAGNOSIS — M205X2 Other deformities of toe(s) (acquired), left foot: Secondary | ICD-10-CM

## 2020-02-29 DIAGNOSIS — M21612 Bunion of left foot: Secondary | ICD-10-CM

## 2020-02-29 NOTE — Progress Notes (Signed)
   HPI: 54 y.o. male presenting today for his annual routine diabetic foot exam.  Patient is doing very well.  He has had some continued numbness to the left forefoot around the great toe.  Aggravated by driving and shoes.  He has also had some very minor intermittent pain to the lateral aspect of the left ankle.  He presents for further treatment evaluation   Past Medical History:  Diagnosis Date  . Diabetes mellitus   . 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 Gout   . Hyperlipidemia   . Hypertension   . Sleep apnea    CPAP use      Physical Exam: General: The patient is alert and oriented x3 in no acute distress.  Dermatology: Skin is warm, dry and supple bilateral lower extremities. Negative for open lesions or macerations.  Vascular: Palpable pedal pulses bilaterally. No edema or erythema noted. Capillary refill within normal limits.  Neurological: Epicritic and protective threshold intact.  Paresthesia with burning, shooting pain noted to the left great toe.  Musculoskeletal Exam: Range of motion within normal limits to all pedal and ankle joints bilateral. Muscle strength 5/5 in all groups bilateral.  There is some very mild pain on palpation along the peroneal tendon as it passes the lateral malleolus left ankle  Assessment: 1. Hallux limitus left - asymptomatic  2. Neuritis left great toe 3.  Hallux valgus bilateral.  Left greater than right 4.  Mild intermittent peroneal tendinitis left   Plan of Care:  1. Patient evaluated.  2. Recommended good shoe gear that is wide fitting so it does not apply as much pressure to the bunion area.  3.  Recommend OTC Motrin as needed for the mild peroneal tendinitis 4.  Return to clinic annually  *Works as a Conservator, museum/gallery for [employer]   Edrick Kins, DPM [facility]  Dr. Edrick Kins, DPM    [address], [place] [address]                Office [phone]   Fax [phone]

## 2020-03-07 ENCOUNTER — Ambulatory Visit: Payer: BC Managed Care – PPO | Admitting: Endocrinology

## 2020-03-07 ENCOUNTER — Other Ambulatory Visit: Payer: Self-pay

## 2020-03-07 ENCOUNTER — Encounter: Payer: Self-pay | Admitting: Endocrinology

## 2020-03-07 VITALS — BP 104/72 | HR 83 | Ht 70.0 in | Wt 263.4 lb

## 2020-03-07 DIAGNOSIS — E291 Testicular hypofunction: Secondary | ICD-10-CM

## 2020-03-07 DIAGNOSIS — I10 Essential (primary) hypertension: Secondary | ICD-10-CM | POA: Diagnosis not present

## 2020-03-07 DIAGNOSIS — E1169 Type 2 diabetes mellitus with other specified complication: Secondary | ICD-10-CM

## 2020-03-07 MED ORDER — NATESTO 5.5 MG/ACT NA GEL: NASAL | 2 refills | Status: DC

## 2020-03-07 NOTE — Patient Instructions (Signed)
Check blood sugars on waking up 2 days a week  Also check blood sugars about 2 hours after meals and do this after different meals by rotation  Recommended blood sugar levels on waking up are 90-130 and about 2 hours after meal is 130-160  Please bring your blood sugar monitor to each visit, thank you   

## 2020-03-07 NOTE — Progress Notes (Signed)
Patient ID: Clifford Kramer, male   DOB: January 17, 1966, 54 y.o.   MRN: [id number]    Reason for Appointment : Endocrinology follow-up  History of Present Illness          Diagnosis: Type 2 diabetes mellitus, date of diagnosis: 2005     Background information: He was probably started on metformin at the time of diagnosis when his blood sugars were not very high About 5-6 years ago because of higher blood sugars he was also given glipizide  He appeared to have  progression of his diabetes with A1c going up to 8.1% in 01/2013; also had difficulty with losing weight before he was started on Victoza.  With this his blood sugars were significantly better His glipizide was reduced to 2.5 mg in 10/14 because of rare hypoglycemia    RECENT history:   Antihyperglycemic drugs : Ozempic 1 mg weekly, and Amaryl 1 mg pcs, Synjardy XR 12.11/998, 2 tablets daily at 8 am   Work routine: He starts work at [employer] AM for [employer]  Current management, blood sugar patterns and problems:  His A1c which was higher than usual at 7.9 is down to 7.2  Has been as low as 6.4    He has not had any further steroids and blood sugars have been recently fairly good  However not clear why his A1c is still not below 7  He has done a little walking but limited from foot pain, he is planning to start his exercise at home with an elliptical at some point  Most of his readings are done before breakfast and lunch and has not done any after dinner  He has lost a little weight compared to his weight last November  No difficulties with doing Ozempic injection  No nausea and no side effects from Topaz which he takes at breakfast    Side effects from medications have been: None       Monitors blood glucose: once a day or twice          Glucometer:      Accu-Chek  Blood Glucose readings from download show:  Blood sugar range 105-156 with most readings before breakfast or lunch and AVERAGE 127  Previous  readings:  PRE-MEAL Fasting Lunch Dinner Bedtime Overall  Glucose range:  110-154   127, 148    Mean/median:        POST-MEAL PC Breakfast PC Lunch PC Dinner  Glucose range:  133-190  170-244   Mean/median:        LIFESTYLE: Meals: 3 meals per day. Dinner 5 pm, Bfst 9 am; lunch 2 pm;  eating out periodically  His breakfast:  eggs and toast or meat, sometimes fast food sandwiches Usually eating fruit and sandwich at lunch, usually a grilled meat at suppertime   Dietician visit: 05/2013           Wt Readings from Last 3 Encounters:  03/07/20 263 lb 6.4 oz (119.5 kg)  06/01/19 268 lb 9.6 oz (121.8 kg)  02/23/19 267 lb 12.8 oz (121.5 kg)   Lab Results  Component Value Date   HGBA1C 7.2 (H) 02/15/2020   HGBA1C 7.9 (H) 09/28/2019   HGBA1C 7.0 (H) 05/25/2019   Lab Results  Component Value Date   MICROALBUR <0.7 09/28/2019   LDLCALC 51 02/15/2020   CREATININE 1.29 02/15/2020    Other active problems including new problems are in review of systems    No visits with results within 1 Week(s) from  this visit.  Latest known visit with results is:  Lab on 02/15/2020  Component Date Value Ref Range Status  . Cholesterol 02/15/2020 100  0 - 200 mg/dL Final   ATP III Classification       Desirable:  < 200 mg/dL               Borderline High:  200 - 239 mg/dL          High:  > = 240 mg/dL  . Triglycerides 02/15/2020 84.0  0 - 149 mg/dL Final   Normal:  <150 mg/dLBorderline High:  150 - 199 mg/dL  . HDL 02/15/2020 31.90* >39.00 mg/dL Final  . VLDL 02/15/2020 16.8  0.0 - 40.0 mg/dL Final  . LDL Cholesterol 02/15/2020 51  0 - 99 mg/dL Final  . Total CHOL/HDL Ratio 02/15/2020 3   Final                  Men          Women1/2 Average Risk     3.4          3.3Average Risk          5.0          4.42X Average Risk          9.6          7.13X Average Risk          15.0          11.0                      . NonHDL 02/15/2020 68.24   Final   NOTE:  Non-HDL goal should be 30 mg/dL higher than  patient's LDL goal (i.e. LDL goal of < 70 mg/dL, would have non-HDL goal of < 100 mg/dL)  . WBC 02/15/2020 6.7  4.0 - 10.5 K/uL Final  . RBC 02/15/2020 6.06* 4.22 - 5.81 Mil/uL Final  . Platelets 02/15/2020 228.0  150 - 400 K/uL Final  . Hemoglobin 02/15/2020 15.3  13.0 - 17.0 g/dL Final  . HCT 02/15/2020 46.2  39 - 52 % Final  . MCV 02/15/2020 76.3* 78.0 - 100.0 fl Final  . MCHC 02/15/2020 33.0  30.0 - 36.0 g/dL Final  . RDW 02/15/2020 16.6* 11.5 - 15.5 % Final  . Testosterone 02/15/2020 509.10  300.00 - 890.00 ng/dL Final  . Sodium 02/15/2020 155* 135 - 145 mEq/L Final  . Potassium 02/15/2020 4.4  3.5 - 5.1 mEq/L Final  . Chloride 02/15/2020 89* 96 - 112 mEq/L Final  . CO2 02/15/2020 25  19 - 32 mEq/L Final  . Glucose, Bld 02/15/2020 142* 70 - 99 mg/dL Final  . BUN 02/15/2020 18  6 - 23 mg/dL Final  . Creatinine, Ser 02/15/2020 1.29  0.40 - 1.50 mg/dL Final  . Total Bilirubin 02/15/2020 0.4  0.2 - 1.2 mg/dL Final  . Alkaline Phosphatase 02/15/2020 79  39 - 117 U/L Final  . AST 02/15/2020 20  0 - 37 U/L Final  . ALT 02/15/2020 25  0 - 53 U/L Final  . Total Protein 02/15/2020 6.9  6.0 - 8.3 g/dL Final  . Albumin 02/15/2020 4.1  3.5 - 5.2 g/dL Final  . GFR 02/15/2020 69.71  >60.00 mL/min Final  . Calcium 02/15/2020 9.5  8.4 - 10.5 mg/dL Final  . Hgb A1c MFr Bld 02/15/2020 7.2* 4.6 - 6.5 % Final   Glycemic Control Guidelines for People with Diabetes:Non Diabetic:  <  6%Goal of Therapy: <7%Additional Action Suggested:  >8%     Allergies as of 03/07/2020      Reactions   Sulfa Antibiotics Itching   REACTION: itching   Celebrex [celecoxib] Itching   REACTION: itching   Penicillins Itching   Sulfonamide Derivatives Itching   REACTION: itching      Medication List       Accurate as of March 07, 2020  9:11 AM. If you have any questions, ask your nurse or doctor.        Accu-Chek FastClix Lancets Misc Use to check blood sugar 3 times daily. Dx Code E11.9   Accu-Chek Guide test  strip Generic drug: glucose blood Use Accu chek guide test strips as instructed to check blood sugar three times daily. DX:E11.9   Accu-Chek Guide w/Device Kit 1 each by Does not apply route 3 (three) times daily. Use accu chek guide device to check blood sugar twice daily. DX:E11.9   allopurinol 300 MG tablet Commonly known as: ZYLOPRIM TAKE 1 TABLET BY MOUTH EVERY DAY   amLODipine 5 MG tablet Commonly known as: NORVASC TAKE 1 TABLET BY MOUTH EVERY DAY   Androderm 4 MG/24HR Pt24 patch Generic drug: testosterone PLACE 2 PATCHES ONTO THE SKIN DAILY   Androderm 2 MG/24HR Pt24 Generic drug: Testosterone Apply 1 patch daily along with 1 patch of 4 mg   aspirin 81 MG tablet Take 81 mg by mouth daily.   BD Pen Needle Nano U/F 32G X 4 MM Misc Generic drug: Insulin Pen Needle USE EVERY DAY   fluticasone 50 MCG/ACT nasal spray Commonly known as: FLONASE PLACE 2 SPRAYS INTO THE NOSE DAILY.   glimepiride 1 MG tablet Commonly known as: AMARYL TAKE 1 TABLET (1 MG TOTAL) BY MOUTH DAILY WITH BREAKFAST.   lisinopril-hydrochlorothiazide 20-12.5 MG tablet Commonly known as: ZESTORETIC TAKE 1 TABLET BY MOUTH EVERY DAY   meloxicam 15 MG tablet Commonly known as: MOBIC Take 15 mg by mouth daily.   omeprazole 40 MG capsule Commonly known as: PRILOSEC TAKE 1 CAPSULE BY MOUTH EVERY DAY   Ozempic (1 MG/DOSE) 2 MG/1.5ML Sopn Generic drug: Semaglutide (1 MG/DOSE) Inject 1 mg into the skin once a week.   simvastatin 20 MG tablet Commonly known as: ZOCOR TAKE 1 TABLET AT BEDTIME   Synjardy XR 12.11-998 MG Tb24 Generic drug: Empagliflozin-metFORMIN HCl ER TAKE 2 TABLETS BY MOUTH DAILY WITH BREAKFAST.   traMADol 50 MG tablet Commonly known as: ULTRAM Take 1 tablet (50 mg total) by mouth every 6 (six) hours as needed.   Viagra 100 MG tablet Generic drug: sildenafil       Allergies:  Allergies  Allergen Reactions  . Sulfa Antibiotics Itching    REACTION: itching  .  Celebrex [Celecoxib] Itching    REACTION: itching  . Penicillins Itching  . Sulfonamide Derivatives Itching    REACTION: itching    Past Medical History:  Diagnosis Date  . Diabetes mellitus   . 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 Gout   . Hyperlipidemia   . Hypertension   . Sleep apnea    CPAP use     Past Surgical History:  Procedure Laterality Date  . COLONOSCOPY  2015  . KNEE SURGERY Left 2005    Family History  Problem Relation Age of Onset  . Diabetes Mother   . Stomach cancer Other   . Diabetes Maternal Aunt   . Diabetes Maternal Grandmother   . Heart disease Neg Hx   . Colon  cancer Neg Hx   . Esophageal cancer Neg Hx   . Rectal cancer Neg Hx     Social History:  reports that he has never smoked. He has never used smokeless tobacco. He reports previous alcohol use. He reports previous drug use.    Review of Systems    HYPERTENSION: This is well controlled with Norvasc 5 mg and  20 mg lisinopril HCTZ  He will check blood pressure at home also, recently 118/80 No lightheadedness  BP Readings from Last 3 Encounters:  03/07/20 104/72  06/01/19 110/80  02/23/19 110/62   Creatinine stable    Lab Results  Component Value Date   CREATININE 1.29 02/15/2020   CREATININE 1.29 09/28/2019   CREATININE 1.30 05/25/2019     GOUT: He has had history of recurrent episodes of probable gout in various joints including knee and elbow in the past Baseline uric acid was 8.4 done in 2012  Taking allopurinol 300 mg daily regularly with last uric acid 3.3  He is taking biologicals for his rheumatoid arthritis from rheumatologist     Hyperlipidemia: LDL has been controlled, baseline previously 109, has been taking simvastatin 20 mg. Has a low HDL  Lab Results  Component Value Date   CHOL 100 02/15/2020   HDL 31.90 (L) 02/15/2020   LDLCALC 51 02/15/2020   TRIG 84.0 02/15/2020   CHOLHDL 3 02/15/2020       HYPOGONADISM:  He has been on testosterone  supplements since 2011 when his level was mildly low around 300  but no further evaluation done.   He has a relatively low testosterone level while using Axiron and had difficulty with the application With Androderm patches he had improved testosterone levels and less fatigue  His testosterone level had been inconsistent with using 8 mg Androderm and now taking the 6 mg total dose He is now getting more skin irritation and sensitivity to the patches and prefers to change [place] Mould is now covered with his insurance and not [employer] Value Date   TESTOSTERONE 509.10 02/15/2020    He takes Viagra for erectile dysfunction  LABS:  No visits with results within 1 Week(s) from this visit.  Latest known visit with results is:  Lab on 02/15/2020  Component Date Value Ref Range Status  . Cholesterol 02/15/2020 100  0 - 200 mg/dL Final   ATP III Classification       Desirable:  < 200 mg/dL               Borderline High:  200 - 239 mg/dL          High:  > = 240 mg/dL  . Triglycerides 02/15/2020 84.0  0 - 149 mg/dL Final   Normal:  <150 mg/dLBorderline High:  150 - 199 mg/dL  . HDL 02/15/2020 31.90* >39.00 mg/dL Final  . VLDL 02/15/2020 16.8  0.0 - 40.0 mg/dL Final  . LDL Cholesterol 02/15/2020 51  0 - 99 mg/dL Final  . Total CHOL/HDL Ratio 02/15/2020 3   Final                  Men          Women1/2 Average Risk     3.4          3.3Average Risk          5.0          4.42X Average Risk  9.6          7.13X Average Risk          15.0          11.0                      . NonHDL 02/15/2020 68.24   Final   NOTE:  Non-HDL goal should be 30 mg/dL higher than patient's LDL goal (i.e. LDL goal of < 70 mg/dL, would have non-HDL goal of < 100 mg/dL)  . WBC 02/15/2020 6.7  4.0 - 10.5 K/uL Final  . RBC 02/15/2020 6.06* 4.22 - 5.81 Mil/uL Final  . Platelets 02/15/2020 228.0  150 - 400 K/uL Final  . Hemoglobin 02/15/2020 15.3  13.0 - 17.0 g/dL Final  . HCT 02/15/2020 46.2  39 -  52 % Final  . MCV 02/15/2020 76.3* 78.0 - 100.0 fl Final  . MCHC 02/15/2020 33.0  30.0 - 36.0 g/dL Final  . RDW 02/15/2020 16.6* 11.5 - 15.5 % Final  . Testosterone 02/15/2020 509.10  300.00 - 890.00 ng/dL Final  . Sodium 02/15/2020 155* 135 - 145 mEq/L Final  . Potassium 02/15/2020 4.4  3.5 - 5.1 mEq/L Final  . Chloride 02/15/2020 89* 96 - 112 mEq/L Final  . CO2 02/15/2020 25  19 - 32 mEq/L Final  . Glucose, Bld 02/15/2020 142* 70 - 99 mg/dL Final  . BUN 02/15/2020 18  6 - 23 mg/dL Final  . Creatinine, Ser 02/15/2020 1.29  0.40 - 1.50 mg/dL Final  . Total Bilirubin 02/15/2020 0.4  0.2 - 1.2 mg/dL Final  . Alkaline Phosphatase 02/15/2020 79  39 - 117 U/L Final  . AST 02/15/2020 20  0 - 37 U/L Final  . ALT 02/15/2020 25  0 - 53 U/L Final  . Total Protein 02/15/2020 6.9  6.0 - 8.3 g/dL Final  . Albumin 02/15/2020 4.1  3.5 - 5.2 g/dL Final  . GFR 02/15/2020 69.71  >60.00 mL/min Final  . Calcium 02/15/2020 9.5  8.4 - 10.5 mg/dL Final  . Hgb A1c MFr Bld 02/15/2020 7.2* 4.6 - 6.5 % Final   Glycemic Control Guidelines for People with Diabetes:Non Diabetic:  <6%Goal of Therapy: <7%Additional Action Suggested:  >8%      Physical Examination:   BP 104/72 (BP Location: Left Arm, Patient Position: Sitting, Cuff Size: Large)   Pulse 83   Ht '5\' 10"'  (1.778 m)   Wt 263 lb 6.4 oz (119.5 kg)   SpO2 93%   BMI 37.79 kg/m         ASSESSMENT/PLAN:   Diabetes type 2, with obesity  See history of present illness for detailed discussion of his current management, blood sugar patterns and problems identified  His A1c is improved at 7.2  Now taking Synjardy, Ozempic and Amaryl  He has not done many blood sugar readings especially after meals and not clear if he still has some postprandial hyperglycemia Also can do better with regular exercise   HYPERTENSION:  His blood pressure is well controlled with his drug regimen He will monitor regularly at home and discuss any low normal readings  with his PCP  Mild renal dysfunction: Stable  Hypogonadism:   He is on Androderm 6 mg with adequate testosterone level However he is wanting to change because of skin irritation  Although he was advised injections by the [facility] he is not able to get Gaynelle Cage approved through his insurance Currently [place] Mould is covered and should be  more convenient to use with more stable levels compared to injections and this was discussed Showed him the device for nasal application which he will try to do 3 times a day Patient information and co-pay card given  Needs lab in 6 weeks and follow-up in 4 months    There are no Patient Instructions on file for this visit.     Elayne Snare 03/07/2020, 9:11 AM

## 2020-03-13 ENCOUNTER — Other Ambulatory Visit: Payer: Self-pay | Admitting: Family Medicine

## 2020-03-31 ENCOUNTER — Other Ambulatory Visit: Payer: Self-pay | Admitting: Family Medicine

## 2020-04-03 ENCOUNTER — Other Ambulatory Visit: Payer: Self-pay | Admitting: Endocrinology

## 2020-04-18 ENCOUNTER — Other Ambulatory Visit: Payer: BC Managed Care – PPO

## 2020-04-25 ENCOUNTER — Other Ambulatory Visit (INDEPENDENT_AMBULATORY_CARE_PROVIDER_SITE_OTHER): Payer: BC Managed Care – PPO

## 2020-04-25 ENCOUNTER — Other Ambulatory Visit: Payer: Self-pay

## 2020-04-25 DIAGNOSIS — E291 Testicular hypofunction: Secondary | ICD-10-CM

## 2020-04-25 LAB — TESTOSTERONE: Testosterone: 396.86 ng/dL (ref 300.00–890.00)

## 2020-04-26 ENCOUNTER — Other Ambulatory Visit: Payer: Self-pay | Admitting: Family Medicine

## 2020-04-26 NOTE — Progress Notes (Signed)
Testosterone level is okay. Please confirm that he is taking Natesto nasal testosterone

## 2020-05-02 ENCOUNTER — Encounter: Payer: BC Managed Care – PPO | Admitting: Family Medicine

## 2020-05-04 ENCOUNTER — Other Ambulatory Visit: Payer: Self-pay | Admitting: Family Medicine

## 2020-05-16 ENCOUNTER — Encounter: Payer: Self-pay | Admitting: Family Medicine

## 2020-05-16 ENCOUNTER — Other Ambulatory Visit: Payer: Self-pay

## 2020-05-16 ENCOUNTER — Ambulatory Visit (INDEPENDENT_AMBULATORY_CARE_PROVIDER_SITE_OTHER): Payer: BC Managed Care – PPO | Admitting: Family Medicine

## 2020-05-16 VITALS — BP 108/68 | HR 84 | Temp 98.3°F | Ht 70.0 in | Wt 258.0 lb

## 2020-05-16 DIAGNOSIS — Z125 Encounter for screening for malignant neoplasm of prostate: Secondary | ICD-10-CM | POA: Diagnosis not present

## 2020-05-16 DIAGNOSIS — E785 Hyperlipidemia, unspecified: Secondary | ICD-10-CM

## 2020-05-16 DIAGNOSIS — E1169 Type 2 diabetes mellitus with other specified complication: Secondary | ICD-10-CM

## 2020-05-16 DIAGNOSIS — I10 Essential (primary) hypertension: Secondary | ICD-10-CM | POA: Diagnosis not present

## 2020-05-16 DIAGNOSIS — Z23 Encounter for immunization: Secondary | ICD-10-CM

## 2020-05-16 DIAGNOSIS — Z0000 Encounter for general adult medical examination without abnormal findings: Secondary | ICD-10-CM

## 2020-05-16 MED ORDER — AMLODIPINE BESYLATE 5 MG PO TABS: 5.0000 mg | ORAL_TABLET | Freq: Every day | ORAL | 3 refills | Status: DC

## 2020-05-16 MED ORDER — ALLOPURINOL 300 MG PO TABS
300.0000 mg | ORAL_TABLET | Freq: Every day | ORAL | 3 refills | Status: DC
Start: 2020-05-16 — End: 2021-07-13

## 2020-05-16 NOTE — Patient Instructions (Addendum)
Preventive Care 41-56 Years Old, Male Preventive care refers to lifestyle choices and visits with your health care provider that can promote health and wellness. This includes:  A yearly physical exam. This is also called an annual well check.  Regular dental and eye exams.  Immunizations.  Screening for certain conditions.  Healthy lifestyle choices, such as eating a healthy diet, getting regular exercise, not using drugs or products that contain nicotine and tobacco, and limiting alcohol use. What can I expect for my preventive care visit? Physical exam Your health care provider will check:  Height and weight. These may be used to calculate body mass index (BMI), which is a measurement that tells if you are at a healthy weight.  Heart rate and blood pressure.  Your skin for abnormal spots. Counseling Your health care provider may ask you questions about:  Alcohol, tobacco, and drug use.  Emotional well-being.  Home and relationship well-being.  Sexual activity.  Eating habits.  Work and work Statistician. What immunizations do I need?  Influenza (flu) vaccine  This is recommended every year. Tetanus, diphtheria, and pertussis (Tdap) vaccine  You may need a Td booster every 10 years. Varicella (chickenpox) vaccine  You may need this vaccine if you have not already been vaccinated. Zoster (shingles) vaccine  You may need this after age 64. Measles, mumps, and rubella (MMR) vaccine  You may need at least one dose of MMR if you were born in 1957 or later. You may also need a second dose. Pneumococcal conjugate (PCV13) vaccine  You may need this if you have certain conditions and were not previously vaccinated. Pneumococcal polysaccharide (PPSV23) vaccine  You may need one or two doses if you smoke cigarettes or if you have certain conditions. Meningococcal conjugate (MenACWY) vaccine  You may need this if you have certain conditions. Hepatitis A  vaccine  You may need this if you have certain conditions or if you travel or work in places where you may be exposed to hepatitis A. Hepatitis B vaccine  You may need this if you have certain conditions or if you travel or work in places where you may be exposed to hepatitis B. Haemophilus influenzae type b (Hib) vaccine  You may need this if you have certain risk factors. Human papillomavirus (HPV) vaccine  If recommended by your health care provider, you may need three doses over 6 months. You may receive vaccines as individual doses or as more than one vaccine together in one shot (combination vaccines). Talk with your health care provider about the risks and benefits of combination vaccines. What tests do I need? Blood tests  Lipid and cholesterol levels. These may be checked every 5 years, or more frequently if you are over 60 years old.  Hepatitis C test.  Hepatitis B test. Screening  Lung cancer screening. You may have this screening every year starting at age 43 if you have a 30-pack-year history of smoking and currently smoke or have quit within the past 15 years.  Prostate cancer screening. Recommendations will vary depending on your family history and other risks.  Colorectal cancer screening. All adults should have this screening starting at age 72 and continuing until age 2. Your health care provider may recommend screening at age 14 if you are at increased risk. You will have tests every 1-10 years, depending on your results and the type of screening test.  Diabetes screening. This is done by checking your blood sugar (glucose) after you have not eaten  for a while (fasting). You may have this done every 1-3 years.  Sexually transmitted disease (STD) testing. Follow these instructions at home: Eating and drinking  Eat a diet that includes fresh fruits and vegetables, whole grains, lean protein, and low-fat dairy products.  Take vitamin and mineral supplements as  recommended by your health care provider.  Do not drink alcohol if your health care provider tells you not to drink.  If you drink alcohol: ? Limit how much you have to 0-2 drinks a day. ? Be aware of how much alcohol is in your drink. In the [place] one drink equals one 12 oz bottle of beer (355 mL), one 5 oz glass of wine (148 mL), or one 1 oz glass of hard liquor (44 mL). Lifestyle  Take daily care of your teeth and gums.  Stay active. Exercise for at least 30 minutes on 5 or more days each week.  Do not use any products that contain nicotine or tobacco, such as cigarettes, e-cigarettes, and chewing tobacco. If you need help quitting, ask your health care provider.  If you are sexually active, practice safe sex. Use a condom or other form of protection to prevent STIs (sexually transmitted infections).  Talk with your health care provider about taking a low-dose aspirin every day starting at age 53. What's next?  Go to your health care provider once a year for a well check visit.  Ask your health care provider how often you should have your eyes and teeth checked.  Stay up to date on all vaccines. This information is not intended to replace advice given to you by your health care provider. Make sure you discuss any questions you have with your health care provider. Document Revised: 07/10/2018 Document Reviewed: 07/10/2018 Elsevier Patient Education  2020 [employer].

## 2020-05-16 NOTE — Progress Notes (Signed)
Subjective:     Clifford Kramer is a 54 y.o. male and is here for a comprehensive physical exam.  Pt is not fasting.  Pt seen by Endo, Dr. Dwyane Dee for DM II. taking Ozempic 1 mg weekly, Amaryl 1 mg, Synjardy Exar 12.11-998 mg 2 tablets daily.   States bs typically 120-130s Taking testosterone supplements.  Now on Natesto. Seen by Rheumatology, now on embrel.  Pt had lipoma removed from posterior occipital area of scalp.  Has appt today for suture removal.    Patient inquires about influenza vaccine.  Patient has had inquires about colonoscopy and PSA.  Last colonoscopy 10/21/2017.  Patient due for repeat in 5 years.  Patient keeping busy at work at [employer].  Pt enjoying tailgating [employer] football games with friends.  Social History   Socioeconomic History  . Marital status: Married    Spouse name: Not on file  . Number of children: Not on file  . Years of education: Not on file  . Highest education level: Not on file  Occupational History  . Not on file  Tobacco Use  . Smoking status: Never Smoker  . Smokeless tobacco: Never Used  Vaping Use  . Vaping Use: Never used  Substance and Sexual Activity  . Alcohol use: Not Currently    Comment: rare beer  . Drug use: Not Currently  . Sexual activity: Not Currently  Other Topics Concern  . Not on file  Social History Narrative  . Not on file   Social Determinants of Health   Financial Resource Strain:   . Difficulty of Paying Living Expenses: Not on file  Food Insecurity:   . Worried About Charity fundraiser in the Last Year: Not on file  . Ran Out of Food in the Last Year: Not on file  Transportation Needs:   . Lack of Transportation (Medical): Not on file  . Lack of Transportation (Non-Medical): Not on file  Physical Activity:   . Days of Exercise per Week: Not on file  . Minutes of Exercise per Session: Not on file  Stress:   . Feeling of Stress : Not on file  Social Connections:   . Frequency of Communication with Friends and  Family: Not on file  . Frequency of Social Gatherings with Friends and Family: Not on file  . Attends Religious Services: Not on file  . Active Member of Clubs or Organizations: Not on file  . Attends Archivist Meetings: Not on file  . Marital Status: Not on file  Intimate Partner Violence:   . Fear of Current or Ex-Partner: Not on file  . Emotionally Abused: Not on file  . Physically Abused: Not on file  . Sexually Abused: Not on file   Health Maintenance  Topic Date Due  . Hepatitis C Screening  Never done  . HIV Screening  Never done  . FOOT EXAM  05/29/2018  . OPHTHALMOLOGY EXAM  09/10/2018  . INFLUENZA VACCINE  02/28/2020  . COVID-19 Vaccine (2 - Pfizer 2-dose series) 05/13/2020  . HEMOGLOBIN A1C  08/17/2020  . TETANUS/TDAP  04/29/2021  . COLONOSCOPY  10/22/2022  . PNEUMOCOCCAL POLYSACCHARIDE VACCINE AGE 88-64 HIGH RISK  Completed    The following portions of the patient's history were reviewed and updated as appropriate: allergies, current medications, past family history, past medical history, past social history, past surgical history and problem list.  Review of Systems A comprehensive review of systems was negative.   Objective:  BP 108/68 (BP Location: Left Arm, Patient Position: Sitting, Cuff Size: Large)   Pulse 84   Temp 98.3 F (36.8 C) (Oral)   Ht _0  (1.778 m)   Wt 258 lb (117 kg)   SpO2 96%   BMI 37.02 kg/m  General appearance: alert, cooperative and no distress Head: Normocephalic, without obvious abnormality, atraumatic Eyes: conjunctivae/corneas clear. PERRL, EOM's intact. Fundi benign. Ears: normal TM's and external ear canals both ears Nose: Nares normal. Septum midline. Mucosa normal. No drainage or sinus tenderness. Throat: lips, mucosa, and tongue normal; teeth and gums normal Neck: no adenopathy, no carotid bruit, no JVD, supple, symmetrical, trachea midline and thyroid not enlarged, symmetric, no  tenderness/mass/nodules Lungs: clear to auscultation bilaterally Heart: regular rate and rhythm, S1, S2 normal, no murmur, click, rub or gallop Abdomen: soft, non-tender; bowel sounds normal; no masses,  no organomegaly Extremities: extremities normal, atraumatic, no cyanosis or edema Pulses: 2+ and symmetric Skin: Skin color, texture, turgor normal. No rashes or lesions.  1 inch healing surgical incision with right foot with sutures in place without erythema, drainage or induration at right occipital area of scalp Lymph nodes: Cervical, supraclavicular, and axillary nodes normal. Neurologic: Alert and oriented X 3, normal strength and tone. Normal symmetric reflexes. Normal coordination and gait   Diabetic Foot Exam - Simple   Simple Foot Form Diabetic Foot exam was performed with the following findings: Yes 05/16/2020  2:05 PM  Visual Inspection No deformities, no ulcerations, no other skin breakdown bilaterally: Yes Sensation Testing Intact to touch and monofilament testing bilaterally: Yes Pulse Check Posterior Tibialis and Dorsalis pulse intact bilaterally: Yes Comments Small callus on plantar surface of R foot at MTP joint of 1st digit.       Assessment:    Healthy male exam.     Plan:     Anticipatory guidance given including wearing seatbelts, smoke detectors in the home, increasing physical activity, increasing p.o. intake of water and vegetables. -We will obtain labs this visit -Colonoscopy done 10/21/2017.  Repeat in 5 yrs.  Next due in 2023 -Obtain PSA this visit -Foot exam done this visit -given handout -Next CPE in 1 year See After Visit Summary for Counseling Recommendations    Type 2 diabetes mellitus with other specified complication, without long-term current use of insulin ([place])  -controlled -hgb A1C 7.2% on 02/15/2020 -Continue current meds including glimepiride 1 mg daily, Ozempic 1 mg weekly, Synjardy XR 12.11-998 mg take 2 tablets with  breakfast -Continue follow-up with endocrinology, Dr. Dwyane Dee - Plan: CMP with eGFR([facility]), Lipid panel  Need for influenza vaccination - Plan: Flu Vaccine QUAD 6+ mos PF IM (Fluarix Quad PF)  Screening for prostate cancer - Plan: PSA  Essential hypertension  -controlled -Continue Norvasc 5 mg, lisinopril-hydrochlorothiazide 20-12.5 mg daily -Continue lifestyle modifications - Plan: CMP with eGFR([facility]), amLODipine (NORVASC) 5 MG tablet, CMP with eGFR([facility])  Hyperlipidemia, unspecified hyperlipidemia type -Continue Zocor 20 mg -Continue lifestyle modifications -Patient not fasting this visit discussed return for lipid panel -Plan: Lipid panel   Follow-up as needed  Grier Mitts, MD

## 2020-05-17 ENCOUNTER — Other Ambulatory Visit: Payer: Self-pay | Admitting: Endocrinology

## 2020-05-17 LAB — CBC WITH DIFFERENTIAL/PLATELET
Absolute Monocytes: 832 cells/uL (ref 200–950)
Basophils Absolute: 50 cells/uL (ref 0–200)
Basophils Relative: 0.6 %
Eosinophils Absolute: 160 cells/uL (ref 15–500)
Eosinophils Relative: 1.9 %
HCT: 47.6 % (ref 38.5–50.0)
Hemoglobin: 15.4 g/dL (ref 13.2–17.1)
Lymphs Abs: 2730 cells/uL (ref 850–3900)
MCH: 25.3 pg — ABNORMAL LOW (ref 27.0–33.0)
MCHC: 32.4 g/dL (ref 32.0–36.0)
MCV: 78.2 fL — ABNORMAL LOW (ref 80.0–100.0)
MPV: 10.8 fL (ref 7.5–12.5)
Monocytes Relative: 9.9 %
Neutro Abs: 4628 cells/uL (ref 1500–7800)
Neutrophils Relative %: 55.1 %
Platelets: 276 10*3/uL (ref 140–400)
RBC: 6.09 10*6/uL — ABNORMAL HIGH (ref 4.20–5.80)
RDW: 16.9 % — ABNORMAL HIGH (ref 11.0–15.0)
Total Lymphocyte: 32.5 %
WBC: 8.4 10*3/uL (ref 3.8–10.8)

## 2020-05-17 LAB — PSA: PSA: 0.27 ng/mL

## 2020-05-17 LAB — COMPLETE METABOLIC PANEL WITH GFR
AG Ratio: 1.7 (calc) (ref 1.0–2.5)
ALT: 19 U/L (ref 9–46)
AST: 14 U/L (ref 10–35)
Albumin: 4.6 g/dL (ref 3.6–5.1)
Alkaline phosphatase (APISO): 82 U/L (ref 35–144)
BUN: 17 mg/dL (ref 7–25)
CO2: 23 mmol/L (ref 20–32)
Calcium: 10.2 mg/dL (ref 8.6–10.3)
Chloride: 103 mmol/L (ref 98–110)
Creat: 1.24 mg/dL (ref 0.70–1.33)
GFR, Est African American: 75 mL/min/{1.73_m2} (ref >=60)
GFR, Est Non African American: 65 mL/min/{1.73_m2} (ref >=60)
Globulin: 2.7 g/dL (calc) (ref 1.9–3.7)
Glucose, Bld: 151 mg/dL — ABNORMAL HIGH (ref 65–99)
Potassium: 4.2 mmol/L (ref 3.5–5.3)
Sodium: 136 mmol/L (ref 135–146)
Total Bilirubin: 0.7 mg/dL (ref 0.2–1.2)
Total Protein: 7.3 g/dL (ref 6.1–8.1)

## 2020-05-17 LAB — LIPID PANEL
Cholesterol: 111 mg/dL (ref <200)
HDL: 36 mg/dL — ABNORMAL LOW (ref >=40)
LDL Cholesterol (Calc): 54 mg/dL (calc)
Non-HDL Cholesterol (Calc): 75 mg/dL (calc) (ref <130)
Total CHOL/HDL Ratio: 3.1 (calc) (ref <5.0)
Triglycerides: 126 mg/dL (ref <150)

## 2020-05-17 NOTE — Telephone Encounter (Signed)
Pt was seen yesterday and had labs done at the office

## 2020-05-18 ENCOUNTER — Other Ambulatory Visit: Payer: Self-pay | Admitting: Family Medicine

## 2020-05-30 ENCOUNTER — Other Ambulatory Visit: Payer: Self-pay | Admitting: *Deleted

## 2020-05-30 MED ORDER — OZEMPIC (1 MG/DOSE) 2 MG/1.5ML ~~LOC~~ SOPN: PEN_INJECTOR | SUBCUTANEOUS | 3 refills | Status: DC

## 2020-06-10 ENCOUNTER — Other Ambulatory Visit: Payer: Self-pay | Admitting: Family Medicine

## 2020-06-21 ENCOUNTER — Other Ambulatory Visit: Payer: Self-pay | Admitting: Endocrinology

## 2020-06-21 ENCOUNTER — Other Ambulatory Visit: Payer: Self-pay | Admitting: Family Medicine

## 2020-06-21 DIAGNOSIS — E1169 Type 2 diabetes mellitus with other specified complication: Secondary | ICD-10-CM

## 2020-06-21 DIAGNOSIS — E291 Testicular hypofunction: Secondary | ICD-10-CM

## 2020-06-27 ENCOUNTER — Other Ambulatory Visit: Payer: Self-pay

## 2020-06-27 ENCOUNTER — Other Ambulatory Visit: Payer: Self-pay | Admitting: Family Medicine

## 2020-06-27 ENCOUNTER — Other Ambulatory Visit (INDEPENDENT_AMBULATORY_CARE_PROVIDER_SITE_OTHER): Payer: BC Managed Care – PPO

## 2020-06-27 DIAGNOSIS — E291 Testicular hypofunction: Secondary | ICD-10-CM | POA: Diagnosis not present

## 2020-06-27 DIAGNOSIS — E1169 Type 2 diabetes mellitus with other specified complication: Secondary | ICD-10-CM

## 2020-06-27 LAB — BASIC METABOLIC PANEL
BUN: 24 mg/dL — ABNORMAL HIGH (ref 6–23)
CO2: 24 mEq/L (ref 19–32)
Calcium: 9.6 mg/dL (ref 8.4–10.5)
Chloride: 101 mEq/L (ref 96–112)
Creatinine, Ser: 1.25 mg/dL (ref 0.40–1.50)
GFR: 64.42 mL/min (ref >60.00)
Glucose, Bld: 145 mg/dL — ABNORMAL HIGH (ref 70–99)
Potassium: 4.2 mEq/L (ref 3.5–5.1)
Sodium: 135 mEq/L (ref 135–145)

## 2020-06-27 LAB — HEMOGLOBIN A1C: Hgb A1c MFr Bld: 7 % — ABNORMAL HIGH (ref 4.6–6.5)

## 2020-06-27 LAB — TESTOSTERONE: Testosterone: 473.21 ng/dL (ref 300.00–890.00)

## 2020-07-04 ENCOUNTER — Other Ambulatory Visit: Payer: Self-pay

## 2020-07-04 ENCOUNTER — Encounter: Payer: Self-pay | Admitting: Endocrinology

## 2020-07-04 ENCOUNTER — Ambulatory Visit: Payer: BC Managed Care – PPO | Admitting: Endocrinology

## 2020-07-04 VITALS — BP 136/84 | HR 94 | Ht 71.0 in | Wt 257.0 lb

## 2020-07-04 DIAGNOSIS — E790 Hyperuricemia without signs of inflammatory arthritis and tophaceous disease: Secondary | ICD-10-CM

## 2020-07-04 DIAGNOSIS — E1165 Type 2 diabetes mellitus with hyperglycemia: Secondary | ICD-10-CM | POA: Diagnosis not present

## 2020-07-04 DIAGNOSIS — I10 Essential (primary) hypertension: Secondary | ICD-10-CM | POA: Diagnosis not present

## 2020-07-04 DIAGNOSIS — E291 Testicular hypofunction: Secondary | ICD-10-CM | POA: Diagnosis not present

## 2020-07-04 NOTE — Progress Notes (Signed)
Patient ID: Clifford Kramer, male   DOB: 07/07/1966, 54 y.o.   MRN: [id number]    Reason for Appointment : Endocrinology follow-up  History of Present Illness          Diagnosis: Type 2 diabetes mellitus, date of diagnosis: 2005     Background information: He was probably started on metformin at the time of diagnosis when his blood sugars were not very high About 5-6 years ago because of higher blood sugars he was also given glipizide  He appeared to have  progression of his diabetes with A1c going up to 8.1% in 01/2013; also had difficulty with losing weight before he was started on Victoza.  With this his blood sugars were significantly better His glipizide was reduced to 2.5 mg in 10/14 because of rare hypoglycemia    RECENT history:   Antihyperglycemic drugs : Ozempic 1 mg weekly, and Amaryl 1 mg pcs, Synjardy XR 12.11/998, 2 tablets daily at 8 am   Work routine: He starts work at [employer] for [employer]  Current management, blood sugar patterns and problems:  His A1c which was in March higher than usual at 7.9 is down to 7  Has been as low as 6.4   His blood sugars are generally well controlled although recently checking very sporadically mostly blood sugars are again midmorning  Breakfast may be higher in carbohydrate and did have a reading of 233 checked within an hour after eating  However he has tried to do better with exercising with walking 20 to 30 minutes at least 3 days a week now  Tolerating both Ozempic and Synjardy and he takes these regularly  He has also tried to reduce his portions and increase vegetables Lab glucose 145   Side effects from medications have been: None       Monitors blood glucose: once a day or twice          Glucometer:      Accu-Chek  Blood Glucose readings from download show:   PRE-MEAL  morning Lunch Dinner Bedtime Overall  Glucose range:  106-151    103-233  Mean/median:     143   POST-MEAL PC Breakfast PC Lunch PC Dinner   Glucose range:  223    Mean/median:       PREVIOUSLY blood sugar range 105-156 with most readings before breakfast or lunch and AVERAGE 127    LIFESTYLE: Meals: 3 meals per day. Dinner 5 pm, Bfst 9 am; lunch 2 pm;  eating out periodically  His breakfast:  eggs and toast or meat, sometimes fast food sandwiches Usually eating fruit and sandwich at lunch, usually a grilled meat at suppertime   Dietician visit: 05/2013           Wt Readings from Last 3 Encounters:  07/04/20 257 lb (116.6 kg)  05/16/20 258 lb (117 kg)  03/07/20 263 lb 6.4 oz (119.5 kg)   Lab Results  Component Value Date   HGBA1C 7.0 (H) 06/27/2020   HGBA1C 7.2 (H) 02/15/2020   HGBA1C 7.9 (H) 09/28/2019   Lab Results  Component Value Date   MICROALBUR <0.7 09/28/2019   LDLCALC 54 05/16/2020   CREATININE 1.25 06/27/2020    Other active problems including new problems are in review of systems    No visits with results within 1 Week(s) from this visit.  Latest known visit with results is:  Lab on 06/27/2020  Component Date Value Ref Range Status  . Testosterone 06/27/2020 473.21  300.00 - 890.00 ng/dL Final  . Sodium 06/27/2020 135  135 - 145 mEq/L Final  . Potassium 06/27/2020 4.2  3.5 - 5.1 mEq/L Final  . Chloride 06/27/2020 101  96 - 112 mEq/L Final  . CO2 06/27/2020 24  19 - 32 mEq/L Final  . Glucose, Bld 06/27/2020 145* 70 - 99 mg/dL Final  . BUN 06/27/2020 24* 6 - 23 mg/dL Final  . Creatinine, Ser 06/27/2020 1.25  0.40 - 1.50 mg/dL Final  . GFR 06/27/2020 64.42  >60.00 mL/min Final   Calculated using the CKD-EPI Creatinine Equation (2021)  . Calcium 06/27/2020 9.6  8.4 - 10.5 mg/dL Final  . Hgb A1c MFr Bld 06/27/2020 7.0* 4.6 - 6.5 % Final   Glycemic Control Guidelines for People with Diabetes:Non Diabetic:  <6%Goal of Therapy: <7%Additional Action Suggested:  >8%     Allergies as of 07/04/2020      Reactions   Sulfa Antibiotics Itching   REACTION: itching   Celebrex [celecoxib] Itching    REACTION: itching   Penicillins Itching   Sulfonamide Derivatives Itching   REACTION: itching      Medication List       Accurate as of July 04, 2020  1:55 PM. If you have any questions, ask your nurse or doctor.        Accu-Chek FastClix Lancets Misc Use to check blood sugar 3 times daily. Dx Code E11.9   Accu-Chek Guide test strip Generic drug: glucose blood Use Accu chek guide test strips as instructed to check blood sugar three times daily. DX:E11.9   Accu-Chek Guide w/Device Kit 1 each by Does not apply route 3 (three) times daily. Use accu chek guide device to check blood sugar twice daily. DX:E11.9   allopurinol 300 MG tablet Commonly known as: ZYLOPRIM Take 1 tablet (300 mg total) by mouth daily.   amLODipine 5 MG tablet Commonly known as: NORVASC Take 1 tablet (5 mg total) by mouth daily.   aspirin 81 MG tablet Take 81 mg by mouth daily.   BD Pen Needle Nano U/F 32G X 4 MM Misc Generic drug: Insulin Pen Needle USE EVERY DAY   fluticasone 50 MCG/ACT nasal spray Commonly known as: FLONASE PLACE 2 SPRAYS INTO THE NOSE DAILY.   glimepiride 1 MG tablet Commonly known as: AMARYL TAKE 1 TABLET (1 MG TOTAL) BY MOUTH DAILY WITH BREAKFAST.   lisinopril-hydrochlorothiazide 20-12.5 MG tablet Commonly known as: ZESTORETIC TAKE 1 TABLET BY MOUTH EVERY DAY   meloxicam 15 MG tablet Commonly known as: MOBIC Take 15 mg by mouth daily.   Natesto 5.5 MG/ACT Gel Generic drug: Testosterone One actuation in each nostril tid   omeprazole 40 MG capsule Commonly known as: PRILOSEC TAKE 1 CAPSULE BY MOUTH EVERY DAY   Ozempic (1 MG/DOSE) 2 MG/1.5ML Sopn Generic drug: Semaglutide (1 MG/DOSE) INJECT 1 MG INTO THE SKIN ONCE A WEEK.   simvastatin 20 MG tablet Commonly known as: ZOCOR TAKE 1 TABLET BY MOUTH EVERYDAY AT BEDTIME   Synjardy XR 12.11-998 MG Tb24 Generic drug: Empagliflozin-metFORMIN HCl ER TAKE 2 TABLETS BY MOUTH DAILY WITH BREAKFAST.   traMADol 50 MG  tablet Commonly known as: ULTRAM Take 1 tablet (50 mg total) by mouth every 6 (six) hours as needed.   Viagra 100 MG tablet Generic drug: sildenafil       Allergies:  Allergies  Allergen Reactions  . Sulfa Antibiotics Itching    REACTION: itching  . Celebrex [Celecoxib] Itching    REACTION: itching  .  Penicillins Itching  . Sulfonamide Derivatives Itching    REACTION: itching    Past Medical History:  Diagnosis Date  . Diabetes mellitus   . 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 Gout   . Hyperlipidemia   . Hypertension   . Sleep apnea    CPAP use     Past Surgical History:  Procedure Laterality Date  . COLONOSCOPY  2015  . KNEE SURGERY Left 2005    Family History  Problem Relation Age of Onset  . Diabetes Mother   . Stomach cancer Other   . Diabetes Maternal Aunt   . Diabetes Maternal Grandmother   . Heart disease Neg Hx   . Colon cancer Neg Hx   . Esophageal cancer Neg Hx   . Rectal cancer Neg Hx     Social History:  reports that he has never smoked. He has never used smokeless tobacco. He reports previous alcohol use. He reports previous drug use.    Review of Systems    HYPERTENSION: This is well controlled with Norvasc 5 mg and  20 mg lisinopril HCTZ  He will check blood pressure at home also, recently 118/80 No lightheadedness  BP Readings from Last 3 Encounters:  07/04/20 136/84  05/16/20 108/68  03/07/20 104/72   Creatinine stable    Lab Results  Component Value Date   CREATININE 1.25 06/27/2020   CREATININE 1.24 05/16/2020   CREATININE 1.29 02/15/2020     GOUT: He has had history of recurrent episodes of probable gout in various joints including knee and elbow in the past Baseline uric acid was 8.4 done in 2012  Taking allopurinol 300 mg daily regularly with last uric acid 3.3  He is taking biologicals for his rheumatoid arthritis from rheumatologist     Hyperlipidemia: LDL has been controlled, baseline previously 109, has  been taking simvastatin 20 mg. Has a low HDL  Lab Results  Component Value Date   CHOL 111 05/16/2020   HDL 36 (L) 05/16/2020   LDLCALC 54 05/16/2020   TRIG 126 05/16/2020   CHOLHDL 3.1 05/16/2020       HYPOGONADISM:  He has been on testosterone supplements since 2011 when his level was mildly low around 300  but no further evaluation done.   He has a relatively low testosterone level while using Axiron and had difficulty with the application With Androderm patches he had improved testosterone levels and less fatigue  His testosterone level had been inconsistent with using 8 mg Androderm and was taking the 6 mg total dose  He was getting more skin irritation and sensitivity to the Androderm and this was stopped [place] is covered with his insurance He has been able to use this twice a day as recommended and his testosterone level is relatively better Energy level is generally fairly good unless he has fatigue from his work schedule No side effects with this except for only occasional nosebleeds   Lab Results  Component Value Date   TESTOSTERONE 473.21 06/27/2020    He takes [place] for erectile dysfunction.  Asking about medication not working consistently and also was given Cialis by the [facility] with only intermittent benefit  LABS:  No visits with results within 1 Week(s) from this visit.  Latest known visit with results is:  Lab on 06/27/2020  Component Date Value Ref Range Status  . Testosterone 06/27/2020 473.21  300.00 - 890.00 ng/dL Final  . Sodium 06/27/2020 135  135 - 145 mEq/L Final  .  Potassium 06/27/2020 4.2  3.5 - 5.1 mEq/L Final  . Chloride 06/27/2020 101  96 - 112 mEq/L Final  . CO2 06/27/2020 24  19 - 32 mEq/L Final  . Glucose, Bld 06/27/2020 145* 70 - 99 mg/dL Final  . BUN 06/27/2020 24* 6 - 23 mg/dL Final  . Creatinine, Ser 06/27/2020 1.25  0.40 - 1.50 mg/dL Final  . GFR 06/27/2020 64.42  >60.00 mL/min Final   Calculated using the  CKD-EPI Creatinine Equation (2021)  . Calcium 06/27/2020 9.6  8.4 - 10.5 mg/dL Final  . Hgb A1c MFr Bld 06/27/2020 7.0* 4.6 - 6.5 % Final   Glycemic Control Guidelines for People with Diabetes:Non Diabetic:  <6%Goal of Therapy: <7%Additional Action Suggested:  >8%      Physical Examination:   BP 136/84   Pulse 94   Ht '5\' 11"'  (1.803 m)   Wt 257 lb (116.6 kg)   SpO2 99%   BMI 35.84 kg/m         ASSESSMENT/PLAN:   Diabetes type 2, with obesity  See history of present illness for detailed discussion of his current management, blood sugar patterns and problems identified  His A1c is improved at 7.0  He is taking Synjardy, Ozempic 1 mg daily and Amaryl 1 mg daily  He has improved his diet and exercise recently Although his blood sugars are better not clear if the readings are consistently controlled after meals which he does not monitor Continue the same regimen but check blood sugars more often after meals  HYPERTENSION:  His blood pressure is well controlled although diastolic higher normal He will monitor regularly at home also  Mild renal dysfunction: Stable  Hypogonadism:   He is on Natesto instead of Androderm 6 mg with adequate testosterone level Subjectively doing well and tolerating this better, previously had skin irritation from Androderm  Erectile dysfunction: This is likely to be related to diabetes and discussed that other factors such as psychological on timing of his Viagra may affect how well these medications work  Follow-up in 4 months    There are no Patient Instructions on file for this visit.     Elayne Snare 07/04/2020, 1:55 PM

## 2020-07-05 ENCOUNTER — Emergency Department
Admission: EM | Admit: 2020-07-05 | Discharge: 2020-07-05 | Disposition: A | Discharge status: Home / Self Care | Payer: BC Managed Care – PPO | Source: Home / Self Care

## 2020-07-05 ENCOUNTER — Other Ambulatory Visit: Payer: Self-pay

## 2020-07-05 DIAGNOSIS — J22 Unspecified acute lower respiratory infection: Secondary | ICD-10-CM

## 2020-07-05 MED ORDER — BENZONATATE 100 MG PO CAPS: 200.0000 mg | ORAL_CAPSULE | Freq: Two times a day (BID) | ORAL | 0 refills | Status: DC | PRN

## 2020-07-05 MED ORDER — AZITHROMYCIN 250 MG PO TABS: ORAL_TABLET | ORAL | 0 refills | Status: DC

## 2020-07-05 MED ORDER — DM-GUAIFENESIN ER 30-600 MG PO TB12: 1.0000 | ORAL_TABLET | Freq: Two times a day (BID) | ORAL | 0 refills | Status: DC

## 2020-07-05 NOTE — ED Triage Notes (Signed)
Pt presents with  Productive ( green) cough for  1 week, sinus pain and congestion. Patient states he has been taking otc cough medications with no improvement of symptoms. No fever. Trouble sleeping due to coughing.

## 2020-07-05 NOTE — Discharge Instructions (Addendum)
Continue to drink lots of water Take antibiotic as directed Take a Mucinex DM and a Tessalon 2 times a day to help suppress the cough Return as needed

## 2020-07-05 NOTE — ED Provider Notes (Signed)
[facility]    CSN: [id number] Arrival date & time: 07/05/20  1639      History   Chief Complaint Chief Complaint  Patient presents with  . Cough    HPI Clifford Kramer is a 54 y.o. male.   HPI  Patient states he has had his Covid vaccines and his booster.  He has had his flu shot.  He is here today for "bronchitis".  He has had a cough for over a week.  It is now productive of green phlegm.  He is having uncontrollable coughing spells.  No wheezing.  No fever or chills.  No headache or body ache.  He is a non-smoker.  No underlying asthma or lung disease.  Past Medical History:  Diagnosis Date  . Diabetes mellitus   . 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 Gout   . Hyperlipidemia   . Hypertension   . Sleep apnea    CPAP use     Patient Active Problem List   Diagnosis Date Noted  . 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 Routine general medical examination at a health care facility 10/01/2017  . 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 Type II diabetes mellitus, uncontrolled ([place]) 03/04/2013  . Dyslipidemia (high LDL; low HDL) 03/04/2013  . Gout 08/17/2010  . Obstructive sleep apnea 12/07/2009  . 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 ERECTILE DYSFUNCTION 03/07/2007  . Essential hypertension 03/07/2007  . ALLERGIC RHINITIS 03/07/2007    Past Surgical History:  Procedure Laterality Date  . COLONOSCOPY  2015  . KNEE SURGERY Left 2005       Home Medications    Prior to Admission medications   Medication Sig Start Date End Date Taking? Authorizing Provider  Accu-Chek FastClix Lancets MISC Use to check blood sugar 3 times daily. Dx Code E11.9 07/14/19   Elayne Snare, MD  allopurinol (ZYLOPRIM) 300 MG tablet Take 1 tablet (300 mg total) by mouth daily. 05/16/20   [name], MD  amLODipine (NORVASC) 5 MG tablet Take 1 tablet (5 mg total) by mouth daily. 05/16/20   [name], MD  aspirin 81 MG tablet Take 81 mg by mouth daily.      [provider]  azithromycin (ZITHROMAX Z-PAK) 250 MG tablet Take two pills today followed by one a day until gone 07/05/20   Raylene Everts, MD  BD PEN NEEDLE NANO U/F 32G X 4 MM MISC USE EVERY DAY 07/26/17   Elayne Snare, MD  benzonatate (TESSALON) 100 MG capsule Take 2 capsules (200 mg total) by mouth 2 (two) times daily as needed for cough. 07/05/20   Raylene Everts, MD  Blood Glucose Monitoring Suppl (ACCU-CHEK GUIDE) w/Device KIT 1 each by Does not apply route 3 (three) times daily. Use accu chek guide device to check blood sugar twice daily. DX:E11.9 07/14/19   Elayne Snare, MD  dextromethorphan-guaiFENesin [facility] DM) 30-600 MG 12hr tablet Take 1 tablet by mouth 2 (two) times daily. 07/05/20   Raylene Everts, MD  ENBREL SURECLICK 50 MG/ML injection Inject into the skin. 06/28/20   [provider]  fluticasone (FLONASE) 50 MCG/ACT nasal spray PLACE 2 SPRAYS INTO THE NOSE DAILY. 06/21/20   [name], MD  glimepiride (AMARYL) 1 MG tablet TAKE 1 TABLET (1 MG TOTAL) BY MOUTH DAILY WITH BREAKFAST. 05/17/20   Elayne Snare, MD  glucose blood (ACCU-CHEK GUIDE) test strip Use Accu chek guide test strips as instructed to check blood sugar three  times daily. DX:E11.9 07/14/19   Elayne Snare, MD  lisinopril-hydrochlorothiazide (ZESTORETIC) 20-12.5 MG tablet TAKE 1 TABLET BY MOUTH EVERY DAY 10/21/19   Elayne Snare, MD  meloxicam (MOBIC) 15 MG tablet Take 15 mg by mouth daily. Patient not taking: Reported on 07/04/2020    [provider]  omeprazole (PRILOSEC) 40 MG capsule TAKE 1 CAPSULE BY MOUTH EVERY DAY 06/28/20   [name], MD  Semaglutide, 1 MG/DOSE, (OZEMPIC, 1 MG/DOSE,) 2 MG/1.5ML SOPN INJECT 1 MG INTO THE SKIN ONCE A WEEK. 05/30/20   Elayne Snare, MD  simvastatin (ZOCOR) 20 MG tablet TAKE 1 TABLET BY MOUTH EVERYDAY AT BEDTIME 05/06/20   [name], MD  SYNJARDY XR 12.11-998 MG TB24 TAKE 2 TABLETS BY MOUTH DAILY WITH BREAKFAST. 12/24/19   Elayne Snare, MD  Testosterone  (NATESTO) 5.5 MG/ACT GEL One actuation in each nostril tid 03/07/20   Elayne Snare, MD  traMADol (ULTRAM) 50 MG tablet Take 1 tablet (50 mg total) by mouth every 6 (six) hours as needed. Patient not taking: Reported on 07/04/2020 05/06/19   Hilts, Legrand Como, MD  VIAGRA 100 MG tablet  04/01/14   [provider]    Family History Family History  Problem Relation Age of Onset  . Diabetes Mother   . Stomach cancer Other   . Diabetes Maternal Aunt   . Diabetes Maternal Grandmother   . Heart disease Neg Hx   . Colon cancer Neg Hx   . Esophageal cancer Neg Hx   . Rectal cancer Neg Hx     Social History Social History   Tobacco Use  . Smoking status: Never Smoker  . Smokeless tobacco: Never Used  Vaping Use  . Vaping Use: Never used  Substance Use Topics  . Alcohol use: Yes    Comment: rare beer  . Drug use: Not Currently     Allergies   Sulfa antibiotics, Celebrex [celecoxib], Penicillins, and Sulfonamide derivatives   Review of Systems Review of Systems See HPI  Physical Exam Triage Vital Signs ED Triage Vitals  Enc Vitals Group     BP 07/05/20 1656 121/79     Pulse Rate 07/05/20 1656 96     Resp 07/05/20 1656 18     Temp 07/05/20 1656 100.1 F (37.8 C)     Temp Source 07/05/20 1656 Oral     SpO2 07/05/20 1656 96 %     Weight 07/05/20 1651 249 lb (112.9 kg)     Height 07/05/20 1651 '5\' 11"'  (1.803 m)     Head Circumference --      Peak Flow --      Pain Score 07/05/20 1651 0     Pain Loc --      Pain Edu? --      Excl. in [place]? --    No data found.  Updated Vital Signs BP 121/79 (BP Location: Left Arm)   Pulse 96   Temp 100.1 F (37.8 C) (Oral)   Resp 18   Ht '5\' 11"'  (1.803 m)   Wt 112.9 kg   SpO2 96%   BMI 34.73 kg/m     Physical Exam Constitutional:      General: He is not in acute distress.    Appearance: He is well-developed.  HENT:     Head: Normocephalic and atraumatic.     Right Ear: Tympanic membrane, ear canal and external ear normal.      Left Ear: Tympanic membrane, ear canal and external ear normal.  Nose: Nose normal. No congestion.     Mouth/Throat:     Mouth: Mucous membranes are moist.     Pharynx: No posterior oropharyngeal erythema.  Eyes:     Conjunctiva/sclera: Conjunctivae normal.     Pupils: Pupils are equal, round, and reactive to light.  Cardiovascular:     Rate and Rhythm: Normal rate and regular rhythm.     Heart sounds: Normal heart sounds.  Pulmonary:     Effort: Pulmonary effort is normal. No respiratory distress.     Comments: Lungs are clear Abdominal:     General: There is no distension.     Palpations: Abdomen is soft.  Musculoskeletal:        General: Normal range of motion.     Cervical back: Normal range of motion.  Skin:    General: Skin is warm and dry.  Neurological:     Mental Status: He is alert.  Psychiatric:        Behavior: Behavior normal.      UC Treatments / Results  Labs (all labs ordered are listed, but only abnormal results are displayed) Labs Reviewed - No data to display  EKG   Radiology No results found.  Procedures Procedures (including critical care time)  Medications Ordered in UC Medications - No data to display  Initial Impression / Assessment and Plan / UC Course  I have reviewed the triage vital signs and the nursing notes.  Pertinent labs & imaging results that were available during my care of the patient were reviewed by me and considered in my medical decision making (see chart for details).     Because the patient's had symptoms for a week and is getting worse, I am going to treat him with an antibiotic.  Cough suppression.  Lots of fluids.  Return as needed Final Clinical Impressions(s) / UC Diagnoses   Final diagnoses:  LRTI (lower respiratory tract infection)     Discharge Instructions     Continue to drink lots of water Take antibiotic as directed Take a Mucinex DM and a Tessalon 2 times a day to help suppress the  cough Return as needed   ED Prescriptions    Medication Sig Dispense Auth. Provider   azithromycin (ZITHROMAX Z-PAK) 250 MG tablet Take two pills today followed by one a day until gone 6 tablet Raylene Everts, MD   benzonatate (TESSALON) 100 MG capsule Take 2 capsules (200 mg total) by mouth 2 (two) times daily as needed for cough. 20 capsule Raylene Everts, MD   dextromethorphan-guaiFENesin [facility] DM) 30-600 MG 12hr tablet Take 1 tablet by mouth 2 (two) times daily. 20 tablet Raylene Everts, MD     PDMP not reviewed this encounter.   Raylene Everts, MD 07/05/20 1719

## 2020-08-01 ENCOUNTER — Other Ambulatory Visit: Payer: Self-pay

## 2020-08-01 ENCOUNTER — Emergency Department (INDEPENDENT_AMBULATORY_CARE_PROVIDER_SITE_OTHER): Payer: BC Managed Care – PPO

## 2020-08-01 ENCOUNTER — Emergency Department
Admission: EM | Admit: 2020-08-01 | Discharge: 2020-08-01 | Disposition: A | Discharge status: Home / Self Care | Payer: BC Managed Care – PPO | Source: Home / Self Care

## 2020-08-01 DIAGNOSIS — R053 Chronic cough: Secondary | ICD-10-CM

## 2020-08-01 MED ORDER — BENZONATATE 100 MG PO CAPS: 100.0000 mg | ORAL_CAPSULE | Freq: Three times a day (TID) | ORAL | 0 refills | Status: DC

## 2020-08-01 NOTE — ED Provider Notes (Signed)
[facility]    CSN: [id number] Arrival date & time: 08/01/20  7517      History   Chief Complaint Chief Complaint  Patient presents with  . Cough    X4 weeks    HPI Clifford Kramer is a 55 y.o. male.   HPI Clifford Kramer is a 55 y.o. male presenting to UC with c/o 4 weeks of cough that was improving after tx with azithromycin 4 weeks ago but cough has not fully resolved, usually worse at end of the day or when taking a deep breath. Denies fever, chills, n/v/d. Denies chest pain or SOB. No hx of asthma but did need an inhaler about 8 years ago for bronchitis. He has received COVID vaccine including booster and flu vaccine.   Past Medical History:  Diagnosis Date  . Diabetes mellitus   . 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 Gout   . Hyperlipidemia   . Hypertension   . Sleep apnea    CPAP use     Patient Active Problem List   Diagnosis Date Noted  . 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 Routine general medical examination at a health care facility 10/01/2017  . 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 Type II diabetes mellitus, uncontrolled ([place]) 03/04/2013  . Dyslipidemia (high LDL; low HDL) 03/04/2013  . Gout 08/17/2010  . Obstructive sleep apnea 12/07/2009  . 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 ERECTILE DYSFUNCTION 03/07/2007  . Essential hypertension 03/07/2007  . ALLERGIC RHINITIS 03/07/2007    Past Surgical History:  Procedure Laterality Date  . COLONOSCOPY  2015  . KNEE SURGERY Left 2005       Home Medications    Prior to Admission medications   Medication Sig Start Date End Date Taking? Authorizing Provider  benzonatate (TESSALON) 100 MG capsule Take 1 capsule (100 mg total) by mouth every 8 (eight) hours. 08/01/20  Yes Sally-Ann Cutbirth, Bronwen Betters, PA-C  Accu-Chek FastClix Lancets MISC Use to check blood sugar 3 times daily. Dx Code E11.9 07/14/19   Elayne Snare, MD  allopurinol (ZYLOPRIM) 300 MG tablet Take 1 tablet (300 mg total) by mouth daily. 05/16/20    [name], MD  amLODipine (NORVASC) 5 MG tablet Take 1 tablet (5 mg total) by mouth daily. 05/16/20   [name], MD  aspirin 81 MG tablet Take 81 mg by mouth daily.      [provider]  azithromycin (ZITHROMAX Z-PAK) 250 MG tablet Take two pills today followed by one a day until gone 07/05/20   Raylene Everts, MD  BD PEN NEEDLE NANO U/F 32G X 4 MM MISC USE EVERY DAY 07/26/17   Elayne Snare, MD  Blood Glucose Monitoring Suppl (ACCU-CHEK GUIDE) w/Device KIT 1 each by Does not apply route 3 (three) times daily. Use accu chek guide device to check blood sugar twice daily. DX:E11.9 07/14/19   Elayne Snare, MD  dextromethorphan-guaiFENesin [facility] DM) 30-600 MG 12hr tablet Take 1 tablet by mouth 2 (two) times daily. 07/05/20   Raylene Everts, MD  ENBREL SURECLICK 50 MG/ML injection Inject into the skin. 06/28/20   [provider]  fluticasone (FLONASE) 50 MCG/ACT nasal spray PLACE 2 SPRAYS INTO THE NOSE DAILY. 06/21/20   [name], MD  glimepiride (AMARYL) 1 MG tablet TAKE 1 TABLET (1 MG TOTAL) BY MOUTH DAILY WITH BREAKFAST. 05/17/20   Elayne Snare, MD  glucose blood (ACCU-CHEK GUIDE) test strip Use Accu chek guide test strips as instructed  to check blood sugar three times daily. DX:E11.9 07/14/19   Elayne Snare, MD  lisinopril-hydrochlorothiazide (ZESTORETIC) 20-12.5 MG tablet TAKE 1 TABLET BY MOUTH EVERY DAY 10/21/19   Elayne Snare, MD  meloxicam (MOBIC) 15 MG tablet Take 15 mg by mouth daily. Patient not taking: Reported on 07/04/2020    [provider]  omeprazole (PRILOSEC) 40 MG capsule TAKE 1 CAPSULE BY MOUTH EVERY DAY 06/28/20   [name], MD  Semaglutide, 1 MG/DOSE, (OZEMPIC, 1 MG/DOSE,) 2 MG/1.5ML SOPN INJECT 1 MG INTO THE SKIN ONCE A WEEK. 05/30/20   Elayne Snare, MD  simvastatin (ZOCOR) 20 MG tablet TAKE 1 TABLET BY MOUTH EVERYDAY AT BEDTIME 05/06/20   [name], MD  SYNJARDY XR 12.11-998 MG TB24 TAKE 2 TABLETS BY MOUTH DAILY WITH  BREAKFAST. 12/24/19   Elayne Snare, MD  Testosterone (NATESTO) 5.5 MG/ACT GEL One actuation in each nostril tid 03/07/20   Elayne Snare, MD  traMADol (ULTRAM) 50 MG tablet Take 1 tablet (50 mg total) by mouth every 6 (six) hours as needed. Patient not taking: Reported on 07/04/2020 05/06/19   Hilts, Legrand Como, MD  VIAGRA 100 MG tablet  04/01/14   [provider]    Family History Family History  Problem Relation Age of Onset  . Diabetes Mother   . Stomach cancer Other   . Diabetes Maternal Aunt   . Diabetes Maternal Grandmother   . Heart disease Neg Hx   . Colon cancer Neg Hx   . Esophageal cancer Neg Hx   . Rectal cancer Neg Hx     Social History Social History   Tobacco Use  . Smoking status: Never Smoker  . Smokeless tobacco: Never Used  Vaping Use  . Vaping Use: Never used  Substance Use Topics  . Alcohol use: Yes    Comment: rare beer  . Drug use: Not Currently     Allergies   Sulfa antibiotics, Celebrex [celecoxib], Penicillins, and Sulfonamide derivatives   Review of Systems Review of Systems  Constitutional: Negative for chills and fever.  HENT: Positive for congestion (mild). Negative for ear pain, sore throat, trouble swallowing and voice change.   Respiratory: Positive for cough. Negative for shortness of breath.   Cardiovascular: Negative for chest pain and palpitations.  Gastrointestinal: Negative for abdominal pain, diarrhea, nausea and vomiting.  Musculoskeletal: Negative for arthralgias, back pain and myalgias.  Skin: Negative for rash.  Neurological: Negative for dizziness, light-headedness and headaches.  All other systems reviewed and are negative.    Physical Exam Triage Vital Signs ED Triage Vitals  Enc Vitals Group     BP 08/01/20 0849 118/82     Pulse Rate 08/01/20 0849 83     Resp 08/01/20 0849 16     Temp 08/01/20 0849 98.4 F (36.9 C)     Temp Source 08/01/20 0849 Oral     SpO2 08/01/20 0849 98 %     Weight --      Height --       Head Circumference --      Peak Flow --      Pain Score 08/01/20 0848 0     Pain Loc --      Pain Edu? --      Excl. in [place]? --    No data found.  Updated Vital Signs BP 118/82 (BP Location: Left Arm)   Pulse 83   Temp 98.4 F (36.9 C) (Oral)   Resp 16   SpO2 98%  Visual Acuity Right Eye Distance:   Left Eye Distance:   Bilateral Distance:    Right Eye Near:   Left Eye Near:    Bilateral Near:     Physical Exam Vitals and nursing note reviewed.  Constitutional:      General: He is not in acute distress.    Appearance: Normal appearance. He is well-developed and well-nourished. He is not ill-appearing, toxic-appearing or diaphoretic.  HENT:     Head: Normocephalic and atraumatic.     Right Ear: Tympanic membrane and ear canal normal.     Left Ear: Tympanic membrane and ear canal normal.     Nose: Nose normal.     Right Sinus: No maxillary sinus tenderness or frontal sinus tenderness.     Left Sinus: No maxillary sinus tenderness or frontal sinus tenderness.     Mouth/Throat:     Lips: Pink.     Mouth: Mucous membranes are moist.     Pharynx: Oropharynx is clear. Uvula midline.  Eyes:     Extraocular Movements: EOM normal.  Cardiovascular:     Rate and Rhythm: Normal rate and regular rhythm.  Pulmonary:     Effort: Pulmonary effort is normal. No respiratory distress.     Breath sounds: Normal breath sounds. No stridor. No wheezing, rhonchi or rales.     Comments: Intermittent dry cough during exam without respiratory distress Musculoskeletal:        General: Normal range of motion.     Cervical back: Normal range of motion.  Skin:    General: Skin is warm and dry.  Neurological:     Mental Status: He is alert and oriented to person, place, and time.  Psychiatric:        Mood and Affect: Mood and affect normal.        Behavior: Behavior normal.      UC Treatments / Results  Labs (all labs ordered are listed, but only abnormal results are  displayed) Labs Reviewed - No data to display  EKG   Radiology DG Chest 2 View  Result Date: 08/01/2020 CLINICAL DATA:  persistent cough for 4 weeks despite antibiotics EXAM: CHEST - 2 VIEW COMPARISON:  None. FINDINGS: No focal consolidation. No pneumothorax or pleural effusion. Cardiomediastinal silhouette is within normal limits. No acute osseous abnormality. IMPRESSION: No focal airspace disease. Electronically Signed   By: Primitivo Gauze M.D.   On: 08/01/2020 09:26    Procedures Procedures (including critical care time)  Medications Ordered in UC Medications - No data to display  Initial Impression / Assessment and Plan / UC Course  I have reviewed the triage vital signs and the nursing notes.  Pertinent labs & imaging results that were available during my care of the patient were reviewed by me and considered in my medical decision making (see chart for details).     Reviewed CXR with pt No evidence of bacterial infection at this time Encouraged symptomatic tx  F/u with PCP next week if needed  Final Clinical Impressions(s) / UC Diagnoses   Final diagnoses:  Persistent cough for 3 weeks or longer   Discharge Instructions   None    ED Prescriptions    Medication Sig Dispense Auth. Provider   benzonatate (TESSALON) 100 MG capsule Take 1 capsule (100 mg total) by mouth every 8 (eight) hours. 21 capsule Noe Gens, [place]     PDMP not reviewed this encounter.   Noe Gens, [place] 08/01/20 1044

## 2020-08-01 NOTE — ED Triage Notes (Signed)
Patient presents to Urgent Care with complaints of cough since 4 weeks ago. Patient reports he was treated w/ an antibiotic and flonase. States the cough got better but just has not gone completely away, worse when he takes a deep breath.

## 2020-08-31 ENCOUNTER — Other Ambulatory Visit: Payer: Self-pay | Admitting: Endocrinology

## 2020-08-31 ENCOUNTER — Other Ambulatory Visit: Payer: Self-pay | Admitting: *Deleted

## 2020-08-31 MED ORDER — NATESTO 5.5 MG/ACT NA GEL
NASAL | 2 refills | Status: DC
Start: 2020-08-31 — End: 2020-11-29

## 2020-09-06 ENCOUNTER — Other Ambulatory Visit: Payer: Self-pay | Admitting: Endocrinology

## 2020-09-21 ENCOUNTER — Other Ambulatory Visit: Payer: Self-pay | Admitting: Endocrinology

## 2020-09-26 ENCOUNTER — Other Ambulatory Visit: Payer: Self-pay | Admitting: Family Medicine

## 2020-09-28 IMAGING — DX RIGHT SHOULDER - 2+ VIEW
3 series · 3 of 3 positions shown · non-contrast
Comparison: None.

CLINICAL DATA: Right shoulder pain

EXAM:
RIGHT SHOULDER - 2+ VIEW

[shoulder grashey ap]
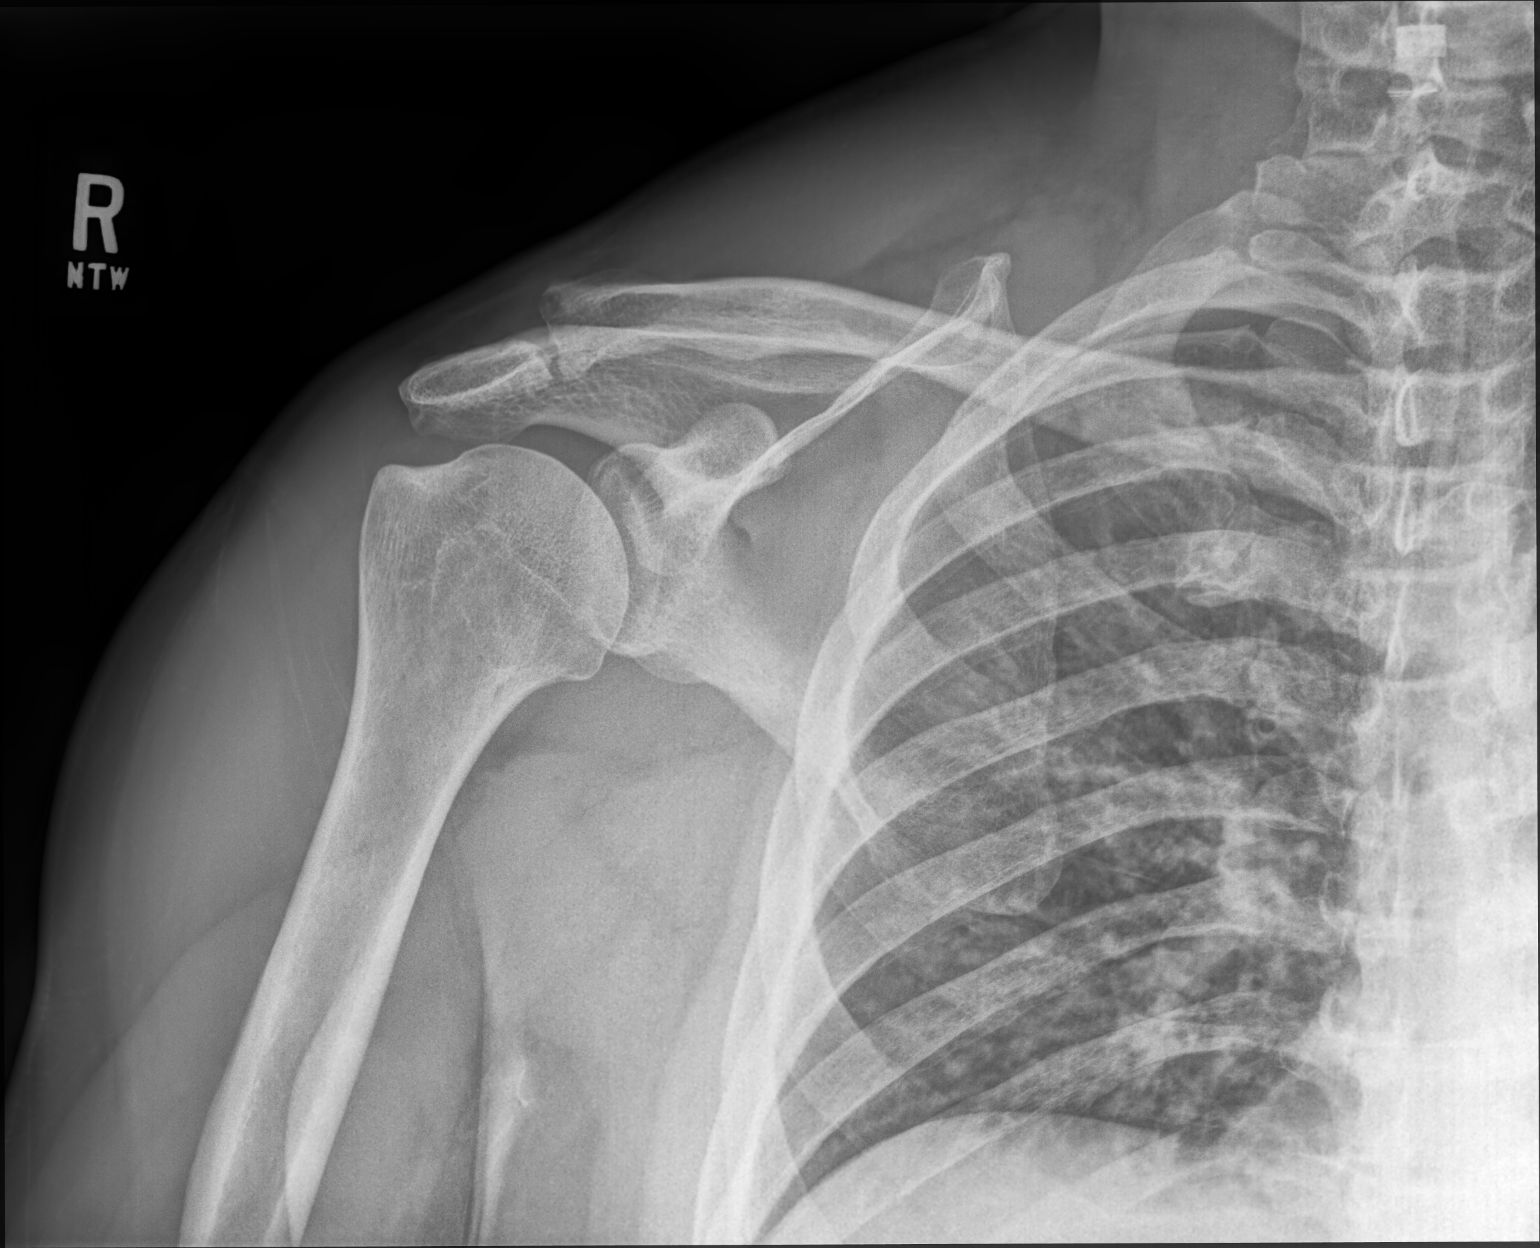

[shoulder y view]
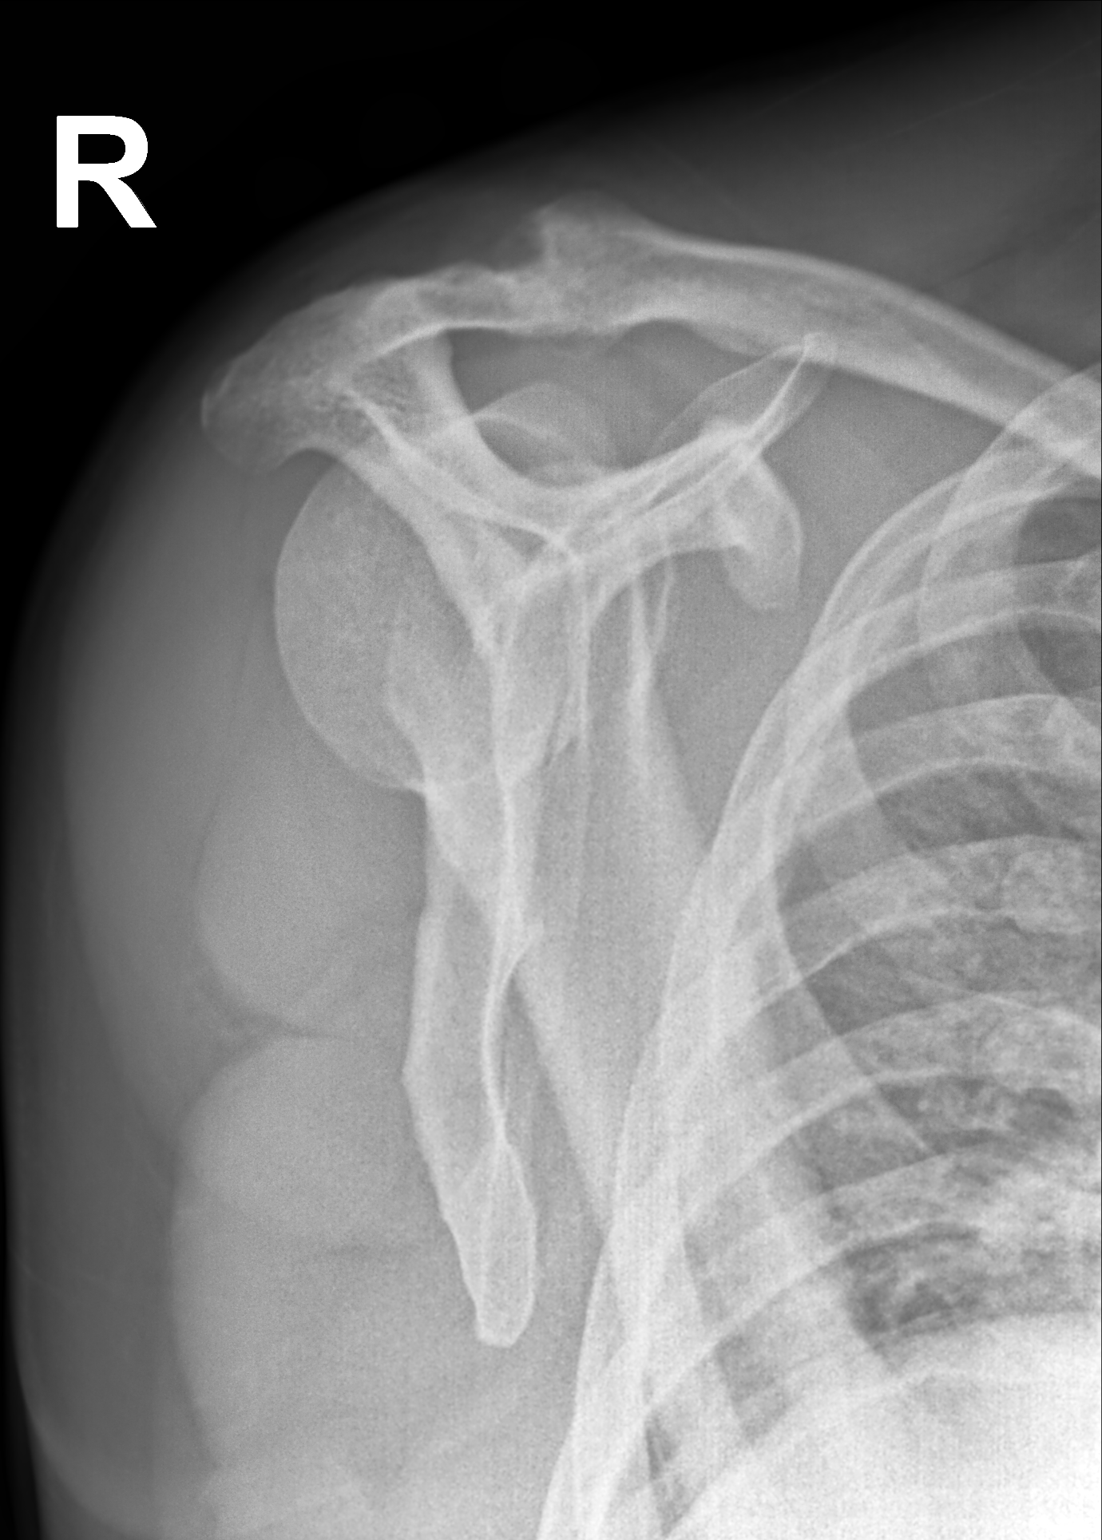

[shoulder axial]
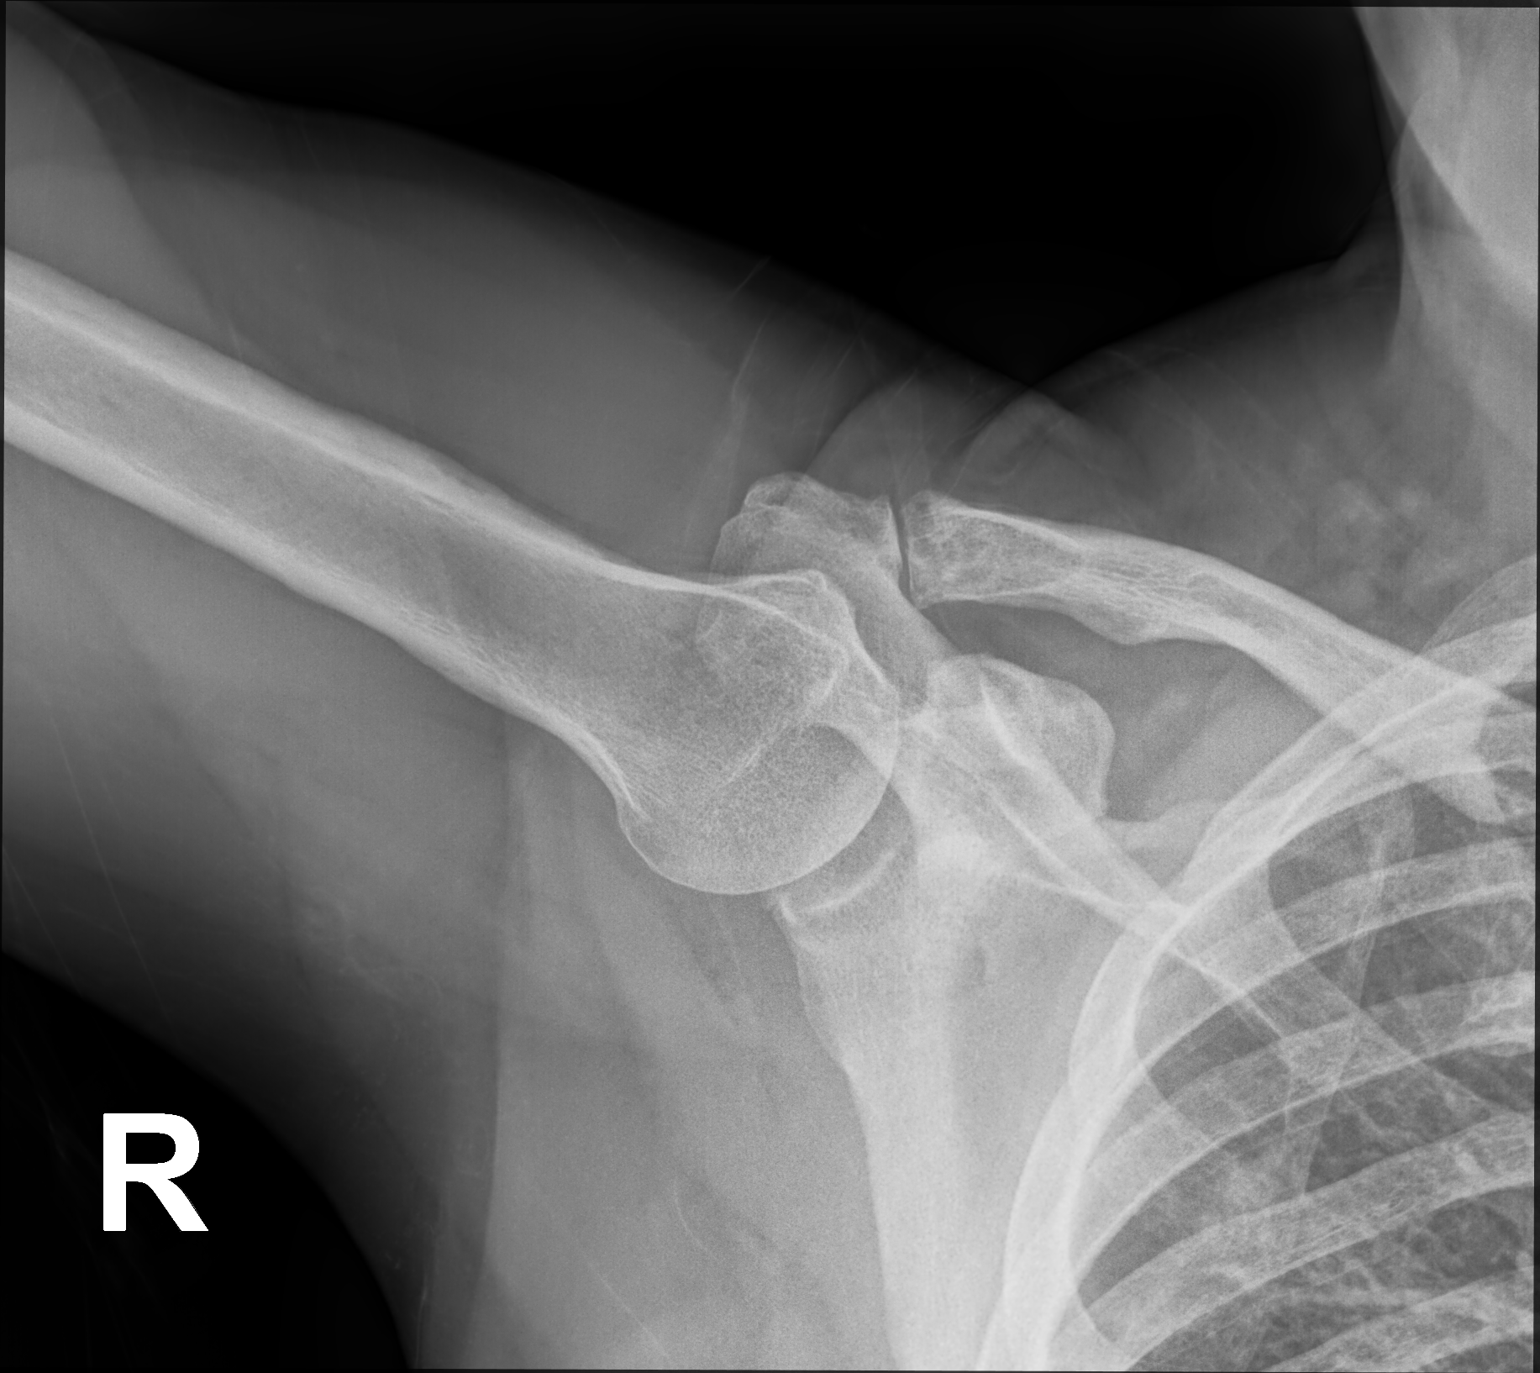

[3 of 3 positions shown; findings below may reference images not displayed]

FINDINGS: Degenerative changes in the AC joint with joint space narrowing and
spurring. Glenohumeral joint is maintained. No acute bony
abnormality. Specifically, no fracture, subluxation, or dislocation.
Soft tissues are intact.
IMPRESSION: Degenerative changes in the right AC joint. No acute bony
abnormality.

## 2020-10-19 ENCOUNTER — Other Ambulatory Visit: Payer: Self-pay | Admitting: Endocrinology

## 2020-10-31 ENCOUNTER — Other Ambulatory Visit: Payer: BC Managed Care – PPO

## 2020-11-07 ENCOUNTER — Ambulatory Visit: Payer: BC Managed Care – PPO | Admitting: Endocrinology

## 2020-11-08 ENCOUNTER — Other Ambulatory Visit: Payer: Self-pay | Admitting: Endocrinology

## 2020-11-21 ENCOUNTER — Other Ambulatory Visit: Payer: Self-pay

## 2020-11-21 ENCOUNTER — Other Ambulatory Visit (INDEPENDENT_AMBULATORY_CARE_PROVIDER_SITE_OTHER): Payer: BC Managed Care – PPO

## 2020-11-21 ENCOUNTER — Ambulatory Visit (INDEPENDENT_AMBULATORY_CARE_PROVIDER_SITE_OTHER): Payer: BC Managed Care – PPO | Admitting: Family Medicine

## 2020-11-21 ENCOUNTER — Encounter: Payer: Self-pay | Admitting: Family Medicine

## 2020-11-21 DIAGNOSIS — E291 Testicular hypofunction: Secondary | ICD-10-CM | POA: Diagnosis not present

## 2020-11-21 DIAGNOSIS — E1165 Type 2 diabetes mellitus with hyperglycemia: Secondary | ICD-10-CM

## 2020-11-21 DIAGNOSIS — R0683 Snoring: Secondary | ICD-10-CM | POA: Insufficient documentation

## 2020-11-21 DIAGNOSIS — G8929 Other chronic pain: Secondary | ICD-10-CM

## 2020-11-21 DIAGNOSIS — M25511 Pain in right shoulder: Secondary | ICD-10-CM | POA: Diagnosis not present

## 2020-11-21 DIAGNOSIS — E790 Hyperuricemia without signs of inflammatory arthritis and tophaceous disease: Secondary | ICD-10-CM

## 2020-11-21 DIAGNOSIS — E669 Obesity, unspecified: Secondary | ICD-10-CM | POA: Insufficient documentation

## 2020-11-21 DIAGNOSIS — F4310 Post-traumatic stress disorder, unspecified: Secondary | ICD-10-CM | POA: Insufficient documentation

## 2020-11-21 LAB — CBC
HCT: 43.1 % (ref 39.0–52.0)
Hemoglobin: 14.3 g/dL (ref 13.0–17.0)
MCHC: 33.1 g/dL (ref 30.0–36.0)
MCV: 77.8 fl — ABNORMAL LOW (ref 78.0–100.0)
Platelets: 231 10*3/uL (ref 150.0–400.0)
RBC: 5.54 Mil/uL (ref 4.22–5.81)
RDW: 15.1 % (ref 11.5–15.5)
WBC: 5.6 10*3/uL (ref 4.0–10.5)

## 2020-11-21 LAB — TESTOSTERONE: Testosterone: 282.78 ng/dL — ABNORMAL LOW (ref 300.00–890.00)

## 2020-11-21 LAB — HEMOGLOBIN A1C: Hgb A1c MFr Bld: 7.5 % — ABNORMAL HIGH (ref 4.6–6.5)

## 2020-11-21 NOTE — Progress Notes (Signed)
Office Visit Note   Patient: Clifford Kramer           Date of Birth: [birth year]           MRN: [id number] Visit Date: 11/21/2020 Requested by: [name], MD [place],  [place] [address] PCP: [name], MD  Subjective: Chief Complaint  Patient presents with  . Right Shoulder - Pain    The shoulder flared up this past December or January. The shoulder pain was so bad he could not raise the arm -- his rheumatologist put him on prednisone - it helped. Has been doing home exercises that he learned from PT the last time his shoulder gave him problems. Still has soreness in the shoulder.     HPI: 55yo M presenting to clinic with acute on chronic right shoulder pain. Patient states that he had very similar pain in 2020, which responded well to a subacromial injection at that time. Over the past few months, his pain has started to return, and is holding him back from exercises he would like to do in the gym- as well as just aching on a daily basis. He says there are some positions he can hold his arm in where he feels a relieving stretch which helps somewhat. 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 This will occasionally exacerbate his symptoms. Pain is difficult for him to pin-point, he says that he isn't able to rub any particular spot. His rheumatologist attempted to injection the front of his shoulder- though he's not sure where, and says that this didn't offer the same degree of comfort as the subacromial. He continues to do his PT exercises regularly. Says he has a history of RA, which is followed by Rheum and currently controlled.               ROS:   All other systems were reviewed and are negative.  Objective: Vital Signs: There were no vitals taken for this visit.  Physical Exam:  General:  Alert and oriented, in no acute distress. Pulm:  Breathing unlabored. Psy:  Normal mood, congruent  affect. Skin:  Right shoulder with no bruising, rashes, or erythema. Overlying skin intact.   Right Shoulder Exam:  Inspection: Symmetric muscle mass, no atrophy or deformity, no scars. Palpation: No specific tenderness to palpation over and around the acromion, over the [facility] joint. Does have some tenderness over long head of biceps- though this does not fully reproduce his symptoms.  Range of motion: Full range of motion in forward flexion, abduction and extension.  Full External rotation to 90 degrees. Apley scratch symmetrical at approximately level of T5 vertebrae, though takes much more effort to achieve this on the right.  Rotator cuff testing:  Full strength and though some discomfort with empty can (supraspinatus).  External rotation with full strength, however endorses discomfort Full strength and no pain with internal rotation.  AC joint testing: No AC tenderness to palpation, positive scarf test. Positive active compression test.  Labral/Biceps testing: O'Brien's/speeds with reduced strength, and significant pain. Crank test: Negative  Impingement testing: Endorses pain with Hawkins  Strength testing:  5 out of 5 strength with shoulder abduction (C5), wrist extension (C6), wrist flexion (C7), grip strength (C8), and finger abduction (T1). Full strength with elbow flexion and extension, which is not painful.   Sensation: Intact to light touch throughout bilateral upper extremities.   Brisk distal  capillary refill.   Imaging: Limited Extremity US, Right Shoulder Biceps tendon visualized in long and short axis, with mild surrounding fluid at the most proximal aspect.  Fibers appear intact. Subscapularis tendon intact, with no significant surrounding effusion. Supraspinatus appears intact, no significant cortical irregularities, effusions, or intratendinous defects. Mild enlargement of subacromial bursa appreciated.  Infraspinatus and teres minor both appear intact, without  surrounding fluid. AC joint visualized, no significant bony spurring.  Does have some surrounding fluid. Glenohumeral joint visualized without significant bony spurring or effusion.  No obvious posterior labral pathology.  Impression: Evidence of mild effusion in Proximal biceps, AC Joint, as well as with mild subacromial bursitis. No obvious rotator cuff pathology.   Assessment & Plan: 55yo M presenting to clinic with acute on chronic R shoulder pain. Previously responded well to subacromial injection. [place] today does demonstrate some bursal enlargement, though also with mild swelling around [facility] Joint and proximal biceps. Pain with some AC and biceps testing, as well as with impingement. Given multiple possible sources, and excellent response to subacromial injection in the past, will try this again. Risks and benefits discussed, and patient opted to proceed. Injection performed as described below, which patient tolerated very well. Voice immediate improvement of his symptoms during anesthetic phase.  - Return precautions discussed - Encouraged to continue his previously prescribed HEP - If no benefit after 1-2 weeks, RTC for reevaluation      Procedures: Right Subacromial Cortisone Injection:  Risks and benefits of procedure discussed, Patient opted to proceed. Verbal Consent obtained.  Timeout performed.  Skin prepped in a sterile fashion with betadine before further cleansing with alcohol. Ethyl Chloride was used for topical analgesia.  Right subacromial space was injected with 3cc 0.25% Bupivacaine without epinephrine via the posterior approach using a 25G, 1.5in needle. Syringe was removed from the needle, and 40mg  methylprednisolone was then injected into the area.  Patient tolerated the injection well with no immediate complications. Aftercare instructions were discussed, and patient was given strict return precautions.    PMFS History: Patient Active Problem List   Diagnosis Date  Noted  . Obesity 11/21/2020  . Posttraumatic stress disorder 11/21/2020  . Snoring 11/21/2020  . 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 Routine general medical examination at a health care facility 10/01/2017  . 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 Type II diabetes mellitus, uncontrolled ([place]) 03/04/2013  . Dyslipidemia (high LDL; low HDL) 03/04/2013  . 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 Hyperlipidemia 07/30/2012  . Gout 08/17/2010  . Obstructive sleep apnea 12/07/2009  . 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 ERECTILE DYSFUNCTION 03/07/2007  . Essential hypertension 03/07/2007  . ALLERGIC RHINITIS 03/07/2007  . Type 2 or unspecified type diabetes mellitus, uncontrolled 07/30/2006   Past Medical History:  Diagnosis Date  . Diabetes mellitus   . 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 Gout   . Hyperlipidemia   . Hypertension   . Sleep apnea    CPAP use     Family History  Problem Relation Age of Onset  . Diabetes Mother   . Stomach cancer Other   . Diabetes Maternal Aunt   . Diabetes Maternal Grandmother   . Heart disease Neg Hx   . Colon cancer Neg Hx   . Esophageal cancer Neg Hx   . Rectal cancer Neg Hx     Past Surgical History:  Procedure Laterality Date  . COLONOSCOPY  2015  . KNEE SURGERY Left 2005   Social History   Occupational History  .  Not on file  Tobacco Use  . Smoking status: Never Smoker  . Smokeless tobacco: Never Used  Vaping Use  . Vaping Use: Never used  Substance and Sexual Activity  . Alcohol use: Yes    Comment: rare beer  . Drug use: Not Currently  . Sexual activity: Yes

## 2020-11-21 NOTE — Progress Notes (Signed)
I saw and examined the patient with Dr. Elouise Munroe and agree with assessment and plan as outlined.    Right shoulder pain, suspect impingement.  Will inject subacromial space again.  MRI if he gets no relief.

## 2020-11-22 LAB — BASIC METABOLIC PANEL
BUN: 19 mg/dL (ref 6–23)
CO2: 22 mEq/L (ref 19–32)
Calcium: 9.5 mg/dL (ref 8.4–10.5)
Chloride: 106 mEq/L (ref 96–112)
Creatinine, Ser: 1.26 mg/dL (ref 0.40–1.50)
GFR: 63.63 mL/min (ref >60.00)
Glucose, Bld: 109 mg/dL — ABNORMAL HIGH (ref 70–99)
Potassium: 4.2 mEq/L (ref 3.5–5.1)
Sodium: 139 mEq/L (ref 135–145)

## 2020-11-22 LAB — MICROALBUMIN / CREATININE URINE RATIO
Creatinine,U: 91.6 mg/dL
Microalb Creat Ratio: 0.8 mg/g (ref 0.0–30.0)
Microalb, Ur: <0.7 mg/dL (ref 0.0–1.9)

## 2020-11-22 LAB — URIC ACID: Uric Acid, Serum: 3.2 mg/dL — ABNORMAL LOW (ref 4.0–7.8)

## 2020-11-27 DIAGNOSIS — U071 COVID-19: Secondary | ICD-10-CM

## 2020-11-27 HISTORY — DX: COVID-19: U07.1

## 2020-11-28 ENCOUNTER — Other Ambulatory Visit: Payer: Self-pay

## 2020-11-28 ENCOUNTER — Other Ambulatory Visit: Payer: Self-pay | Admitting: *Deleted

## 2020-11-28 ENCOUNTER — Ambulatory Visit (INDEPENDENT_AMBULATORY_CARE_PROVIDER_SITE_OTHER): Payer: BC Managed Care – PPO | Admitting: Endocrinology

## 2020-11-28 ENCOUNTER — Encounter: Payer: Self-pay | Admitting: Endocrinology

## 2020-11-28 ENCOUNTER — Other Ambulatory Visit: Payer: Self-pay | Admitting: Endocrinology

## 2020-11-28 VITALS — BP 130/84 | HR 78 | Ht 71.0 in | Wt 262.0 lb

## 2020-11-28 DIAGNOSIS — E1165 Type 2 diabetes mellitus with hyperglycemia: Secondary | ICD-10-CM | POA: Diagnosis not present

## 2020-11-28 DIAGNOSIS — I10 Essential (primary) hypertension: Secondary | ICD-10-CM | POA: Diagnosis not present

## 2020-11-28 DIAGNOSIS — E291 Testicular hypofunction: Secondary | ICD-10-CM | POA: Diagnosis not present

## 2020-11-28 MED ORDER — ACCU-CHEK GUIDE VI STRP: ORAL_STRIP | 12 refills | Status: DC

## 2020-11-28 NOTE — Patient Instructions (Addendum)
Check blood sugars on waking up 3 days a week  Also check blood sugars about 2 hours after meals and do this after different meals by rotation  Recommended blood sugar levels on waking up are 90-130 and about 2 hours after meals is 130-160  Please bring your blood sugar monitor to each visit, thank you  Am dose Natesto on waking up

## 2020-11-28 NOTE — Progress Notes (Signed)
Patient ID: Clifford Kramer, male   DOB: [birth year], 55 y.o.   MRN: [id number]    Reason for Appointment : Endocrinology follow-up  History of Present Illness          Diagnosis: Type 2 diabetes mellitus, date of diagnosis: 2005     Background information: He was probably started on metformin at the time of diagnosis when his blood sugars were not very high About 5-6 years ago because of higher blood sugars he was also given glipizide  He appeared to have  progression of his diabetes with A1c going up to 8.1% in 01/2013; also had difficulty with losing weight before he was started on Victoza.  With this his blood sugars were significantly better His glipizide was reduced to 2.5 mg in 10/14 because of rare hypoglycemia    RECENT history:   Antihyperglycemic drugs : Ozempic 1 mg weekly, and Amaryl 1 mg pcs, Synjardy XR 12.11/998, 2 tablets daily at 8 am  Work routine: He starts work at [employer] for [employer]  Current management, blood sugar patterns and problems:  His A1c is gradually going up and now 7.5  Has been as low as 6.4   His blood sugars are not being monitored much at all and only a few readings while at work, usually not doing after meals  He has not gone to the gym lately and also has not been generally exercising as before  Has gained weight although not clear if this is related to being on prednisone in March  Likely has not been watching his diet consistently and having issues with needing to eat out more because of moving  Has not missed any medications  Lab glucose 109   Side effects from medications have been: None       Monitors blood glucose: once a day or twice          Glucometer:      Accu-Chek  Blood Glucose readings from download show recent range 117-139, median 127:  Previously:  PRE-MEAL  morning Lunch Dinner Bedtime Overall  Glucose range:  106-151    103-233  Mean/median:     143   POST-MEAL PC Breakfast PC Lunch PC Dinner  Glucose  range:  223    Mean/median:         LIFESTYLE: Meals: 3 meals per day. Dinner 5 pm, Bfst 9 am; lunch 2 pm;  eating out periodically  His breakfast:  eggs and toast or meat, sometimes fast food sandwiches Usually eating fruit and sandwich at lunch, usually a grilled meat at suppertime   Dietician visit: 05/2013           Wt Readings from Last 3 Encounters:  11/28/20 262 lb (118.8 kg)  07/05/20 249 lb (112.9 kg)  07/04/20 257 lb (116.6 kg)   Lab Results  Component Value Date   HGBA1C 7.5 (H) 11/21/2020   HGBA1C 7.0 (H) 06/27/2020   HGBA1C 7.2 (H) 02/15/2020   Lab Results  Component Value Date   MICROALBUR <0.7 11/21/2020   LDLCALC 54 05/16/2020   CREATININE 1.26 11/21/2020    Other active problems including new problems are in review of systems    No visits with results within 1 Week(s) from this visit.  Latest known visit with results is:  Lab on 11/21/2020  Component Date Value Ref Range Status  . Uric Acid, Serum 11/21/2020 3.2* 4.0 - 7.8 mg/dL Final  . WBC 11/21/2020 5.6  4.0 - 10.5 K/uL  Final  . RBC 11/21/2020 5.54  4.22 - 5.81 Mil/uL Final  . Platelets 11/21/2020 231.0  150.0 - 400.0 K/uL Final  . Hemoglobin 11/21/2020 14.3  13.0 - 17.0 g/dL Final  . HCT 11/21/2020 43.1  39.0 - 52.0 % Final  . MCV 11/21/2020 77.8* 78.0 - 100.0 fl Final  . MCHC 11/21/2020 33.1  30.0 - 36.0 g/dL Final  . RDW 11/21/2020 15.1  11.5 - 15.5 % Final  . Testosterone 11/21/2020 282.78* 300.00 - 890.00 ng/dL Final  . Microalb, Ur 11/21/2020 <0.7  0.0 - 1.9 mg/dL Final  . Creatinine,U 11/21/2020 91.6  mg/dL Final  . Microalb Creat Ratio 11/21/2020 0.8  0.0 - 30.0 mg/g Final  . Sodium 11/21/2020 139  135 - 145 mEq/L Final  . Potassium 11/21/2020 4.2  3.5 - 5.1 mEq/L Final  . Chloride 11/21/2020 106  96 - 112 mEq/L Final  . CO2 11/21/2020 22  19 - 32 mEq/L Final  . Glucose, Bld 11/21/2020 109* 70 - 99 mg/dL Final  . BUN 11/21/2020 19  6 - 23 mg/dL Final  . Creatinine, Ser 11/21/2020  1.26  0.40 - 1.50 mg/dL Final  . GFR 11/21/2020 63.63  >60.00 mL/min Final   Calculated using the CKD-EPI Creatinine Equation (2021)  . Calcium 11/21/2020 9.5  8.4 - 10.5 mg/dL Final  . Hgb A1c MFr Bld 11/21/2020 7.5* 4.6 - 6.5 % Final   Glycemic Control Guidelines for People with Diabetes:Non Diabetic:  <6%Goal of Therapy: <7%Additional Action Suggested:  >8%     Allergies as of 11/28/2020      Reactions   Sulfa Antibiotics Itching   REACTION: itching   Celebrex [celecoxib] Itching   REACTION: itching   Penicillins Itching   Sulfonamide Derivatives Itching   REACTION: itching      Medication List       Accurate as of Nov 28, 2020 10:35 AM. If you have any questions, ask your nurse or doctor.        Accu-Chek FastClix Lancets Misc Use to check blood sugar 3 times daily. Dx Code E11.9   Accu-Chek Guide test strip Generic drug: glucose blood Use Accu chek guide test strips as instructed to check blood sugar three times daily. DX:E11.9   Accu-Chek Guide w/Device Kit 1 each by Does not apply route 3 (three) times daily. Use accu chek guide device to check blood sugar twice daily. DX:E11.9   allopurinol 300 MG tablet Commonly known as: ZYLOPRIM Take 1 tablet (300 mg total) by mouth daily.   amLODipine 5 MG tablet Commonly known as: NORVASC Take 1 tablet (5 mg total) by mouth daily.   aspirin 81 MG tablet Take 81 mg by mouth daily.   BD Pen Needle Nano U/F 32G X 4 MM Misc Generic drug: Insulin Pen Needle USE EVERY DAY   Enbrel SureClick 50 MG/ML injection Generic drug: etanercept Inject into the skin.   fluticasone 50 MCG/ACT nasal spray Commonly known as: FLONASE PLACE 2 SPRAYS INTO THE NOSE DAILY.   glimepiride 1 MG tablet Commonly known as: AMARYL TAKE 1 TABLET (1 MG TOTAL) BY MOUTH DAILY WITH BREAKFAST.   lisinopril-hydrochlorothiazide 20-12.5 MG tablet Commonly known as: ZESTORETIC TAKE 1 TABLET BY MOUTH EVERY DAY   meloxicam 15 MG tablet Commonly known  as: MOBIC Take 15 mg by mouth daily.   Natesto 5.5 MG/ACT Gel Generic drug: Testosterone One actuation in each nostril tid   omeprazole 40 MG capsule Commonly known as: PRILOSEC TAKE 1 CAPSULE BY MOUTH  EVERY DAY   Ozempic (1 MG/DOSE) 2 MG/1.5ML Sopn Generic drug: Semaglutide (1 MG/DOSE) INJECT 1 MG INTO THE SKIN ONCE A WEEK.   simvastatin 20 MG tablet Commonly known as: ZOCOR TAKE 1 TABLET BY MOUTH EVERYDAY AT BEDTIME   Synjardy XR 12.11-998 MG Tb24 Generic drug: Empagliflozin-metFORMIN HCl ER TAKE 2 TABLETS BY MOUTH DAILY WITH BREAKFAST.   traMADol 50 MG tablet Commonly known as: ULTRAM Take 1 tablet (50 mg total) by mouth every 6 (six) hours as needed.   Viagra 100 MG tablet Generic drug: sildenafil       Allergies:  Allergies  Allergen Reactions  . Sulfa Antibiotics Itching    REACTION: itching  . Celebrex [Celecoxib] Itching    REACTION: itching  . Penicillins Itching  . Sulfonamide Derivatives Itching    REACTION: itching    Past Medical History:  Diagnosis Date  . Diabetes mellitus   . 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 Gout   . Hyperlipidemia   . Hypertension   . Sleep apnea    CPAP use     Past Surgical History:  Procedure Laterality Date  . COLONOSCOPY  2015  . KNEE SURGERY Left 2005    Family History  Problem Relation Age of Onset  . Diabetes Mother   . Stomach cancer Other   . Diabetes Maternal Aunt   . Diabetes Maternal Grandmother   . Heart disease Neg Hx   . Colon cancer Neg Hx   . Esophageal cancer Neg Hx   . Rectal cancer Neg Hx     Social History:  reports that he has never smoked. He has never used smokeless tobacco. He reports current alcohol use. He reports previous drug use.    Review of Systems    HYPERTENSION: This is well controlled with Norvasc 5 mg and  20 mg lisinopril HCTZ  He will check blood pressure at home periodically   BP Readings from Last 3 Encounters:  11/28/20 130/84  08/01/20 118/82   07/05/20 121/79   Creatinine stable    Lab Results  Component Value Date   CREATININE 1.26 11/21/2020   CREATININE 1.25 06/27/2020   CREATININE 1.24 05/16/2020     GOUT: He has had history of recurrent episodes of probable gout in various joints including knee and elbow in the past Baseline uric acid was 8.4 done in 2012  Taking allopurinol 300 mg daily regularly with last uric acid 3.3  He is taking biologicals for his rheumatoid arthritis from rheumatologist     Hyperlipidemia: LDL has been controlled, baseline previously 109, has been taking simvastatin 20 mg. Has a low HDL  Lab Results  Component Value Date   CHOL 111 05/16/2020   HDL 36 (L) 05/16/2020   LDLCALC 54 05/16/2020   TRIG 126 05/16/2020   CHOLHDL 3.1 05/16/2020       HYPOGONADISM:  He has been on testosterone supplements since 2011 when his level was mildly low around 300  but no further evaluation done.   He has a relatively low testosterone level while using Axiron and had difficulty with the application With Androderm patches he had improved testosterone levels and less fatigue  His testosterone level had been inconsistent with using 8 mg Androderm and was taking the 6 mg total dose  He was getting more skin irritation and sensitivity to the Androderm and this was stopped [place] is covered with his insurance and has been taking this regularly  He has been able to use this  twice a day as recommended although at the end of the supply he does not get consistent amounts of the gel spray Also he thinks he may have missed the dose the morning he had any labs No unusual complaints of fatigue Testosterone level previously was normal and now below 300   Lab Results  Component Value Date   TESTOSTERONE 282.78 (L) 11/21/2020    He takes Viagra/ Cialis for erectile dysfunction.  Asking about medication not working consistently and also was given Cialis by the [facility] with only intermittent  benefit  LABS:  No visits with results within 1 Week(s) from this visit.  Latest known visit with results is:  Lab on 11/21/2020  Component Date Value Ref Range Status  . Uric Acid, Serum 11/21/2020 3.2* 4.0 - 7.8 mg/dL Final  . WBC 11/21/2020 5.6  4.0 - 10.5 K/uL Final  . RBC 11/21/2020 5.54  4.22 - 5.81 Mil/uL Final  . Platelets 11/21/2020 231.0  150.0 - 400.0 K/uL Final  . Hemoglobin 11/21/2020 14.3  13.0 - 17.0 g/dL Final  . HCT 11/21/2020 43.1  39.0 - 52.0 % Final  . MCV 11/21/2020 77.8* 78.0 - 100.0 fl Final  . MCHC 11/21/2020 33.1  30.0 - 36.0 g/dL Final  . RDW 11/21/2020 15.1  11.5 - 15.5 % Final  . Testosterone 11/21/2020 282.78* 300.00 - 890.00 ng/dL Final  . Microalb, Ur 11/21/2020 <0.7  0.0 - 1.9 mg/dL Final  . Creatinine,U 11/21/2020 91.6  mg/dL Final  . Microalb Creat Ratio 11/21/2020 0.8  0.0 - 30.0 mg/g Final  . Sodium 11/21/2020 139  135 - 145 mEq/L Final  . Potassium 11/21/2020 4.2  3.5 - 5.1 mEq/L Final  . Chloride 11/21/2020 106  96 - 112 mEq/L Final  . CO2 11/21/2020 22  19 - 32 mEq/L Final  . Glucose, Bld 11/21/2020 109* 70 - 99 mg/dL Final  . BUN 11/21/2020 19  6 - 23 mg/dL Final  . Creatinine, Ser 11/21/2020 1.26  0.40 - 1.50 mg/dL Final  . GFR 11/21/2020 63.63  >60.00 mL/min Final   Calculated using the CKD-EPI Creatinine Equation (2021)  . Calcium 11/21/2020 9.5  8.4 - 10.5 mg/dL Final  . Hgb A1c MFr Bld 11/21/2020 7.5* 4.6 - 6.5 % Final   Glycemic Control Guidelines for People with Diabetes:Non Diabetic:  <6%Goal of Therapy: <7%Additional Action Suggested:  >8%      Physical Examination:   BP 130/84   Pulse 78   Ht '5\' 11"'  (1.803 m)   Wt 262 lb (118.8 kg)   SpO2 97%   BMI 36.54 kg/m         ASSESSMENT/PLAN:   Diabetes type 2, with obesity  See history of present illness for detailed discussion of his current management, blood sugar patterns and problems identified  His A1c is 7.5  He is taking Synjardy, Ozempic 1 mg daily and Amaryl 1  mg daily  With lifestyle changes and weight gain his blood sugars are not as good Blood sugars are not being monitored much and likely higher after meals which he does not check  He thinks he can get back into regular exercise at the gym and watch his diet better Also recommended checking more readings after meals more regularly If his blood sugars are not improved we will consider increasing his Ozempic dose  HYPERTENSION:  His blood pressure is well controlled although diastolic remains high normal May do better with weight loss and exercise Microalbumin normal  History of  gout: His uric acid has maintained below normal, potentially can be treated with 100 mg allopurinol  Hypogonadism:   He is on Natesto and subjectively doing well but his testosterone level is low Is likely to be from not taking it the morning of his test  Discussed that he should ideally take the morning dose on waking up and instead of 4 hours later  Follow-up in 3 months    There are no Patient Instructions on file for this visit.     Elayne Snare 11/28/2020, 10:35 AM

## 2020-11-29 ENCOUNTER — Telehealth: Payer: Self-pay | Admitting: *Deleted

## 2020-11-29 NOTE — Telephone Encounter (Signed)
Rx sent by Dr. Dwyane Dee

## 2020-12-27 ENCOUNTER — Other Ambulatory Visit: Payer: Self-pay | Admitting: Family Medicine

## 2020-12-28 ENCOUNTER — Other Ambulatory Visit: Payer: Self-pay

## 2020-12-28 ENCOUNTER — Encounter: Payer: Self-pay | Admitting: Family Medicine

## 2020-12-28 MED ORDER — OMEPRAZOLE 40 MG PO CPDR: DELAYED_RELEASE_CAPSULE | ORAL | 1 refills | Status: DC

## 2021-02-10 ENCOUNTER — Encounter: Payer: Self-pay | Admitting: Emergency Medicine

## 2021-02-10 ENCOUNTER — Emergency Department (INDEPENDENT_AMBULATORY_CARE_PROVIDER_SITE_OTHER): Payer: BC Managed Care – PPO

## 2021-02-10 ENCOUNTER — Emergency Department
Admission: EM | Admit: 2021-02-10 | Discharge: 2021-02-10 | Disposition: A | Discharge status: Home / Self Care | Payer: BC Managed Care – PPO | Source: Home / Self Care

## 2021-02-10 ENCOUNTER — Other Ambulatory Visit: Payer: Self-pay

## 2021-02-10 DIAGNOSIS — R0989 Other specified symptoms and signs involving the circulatory and respiratory systems: Secondary | ICD-10-CM

## 2021-02-10 DIAGNOSIS — R059 Cough, unspecified: Secondary | ICD-10-CM

## 2021-02-10 DIAGNOSIS — R000 Tachycardia, unspecified: Secondary | ICD-10-CM

## 2021-02-10 DIAGNOSIS — R509 Fever, unspecified: Secondary | ICD-10-CM

## 2021-02-10 DIAGNOSIS — J309 Allergic rhinitis, unspecified: Secondary | ICD-10-CM

## 2021-02-10 LAB — POC SARS CORONAVIRUS 2 AG -  ED: SARS Coronavirus 2 Ag: NEGATIVE

## 2021-02-10 MED ORDER — ACETAMINOPHEN 325 MG PO TABS
650.0000 mg | ORAL_TABLET | Freq: Once | ORAL | Status: AC
  Administered 2021-02-10: 650 mg via ORAL

## 2021-02-10 MED ORDER — FEXOFENADINE HCL 180 MG PO TABS: 180.0000 mg | ORAL_TABLET | Freq: Every day | ORAL | 0 refills | Status: DC

## 2021-02-10 MED ORDER — PREDNISONE 20 MG PO TABS: ORAL_TABLET | ORAL | 0 refills | Status: DC

## 2021-02-10 MED ORDER — HYDROCOD POLST-CPM POLST ER 10-8 MG/5ML PO SUER: 5.0000 mL | Freq: Two times a day (BID) | ORAL | 0 refills | Status: AC | PRN

## 2021-02-10 NOTE — ED Provider Notes (Signed)
[facility]    CSN: [id number] Arrival date & time: 02/10/21  1820      History   Chief Complaint Chief Complaint  Patient presents with   Cough    HPI Clifford Kramer is a 55 y.o. male.   HPI Pleasant 55 year old male presents with sore throat since Tuesday, cough since Wednesday.  Reports having COVID-19 in May of this year.  Patient is vaccinated for COVID-19 with 2 boosters.  Oral temp in triage was 102.4.  PMH significant for OSA, HTN, and T2DM.  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 Authorizing Provider  chlorpheniramine-HYDROcodone (TUSSIONEX PENNKINETIC ER) 10-8 MG/5ML SUER Take 5 mLs by mouth every 12 (twelve) hours as needed for up to 7 days for cough. 02/10/21 02/17/21 Yes Eliezer Lofts, FNP  fexofenadine [facility] ALLERGY) 180 MG tablet Take 1 tablet (180 mg total) by mouth daily for 15  days. 02/10/21 02/25/21 Yes Eliezer Lofts, FNP  predniSONE (DELTASONE) 20 MG tablet Take 3 tabs PO daily x 5 days 02/10/21  Yes Eliezer Lofts, FNP  Accu-Chek FastClix Lancets MISC Use to check blood sugar 3 times daily. Dx Code E11.9 07/14/19   Elayne Snare, MD  allopurinol (ZYLOPRIM) 300 MG tablet Take 1 tablet (300 mg total) by mouth daily. 05/16/20   [name], MD  amLODipine (NORVASC) 5 MG tablet Take 1 tablet (5 mg total) by mouth daily. 05/16/20   [name], MD  aspirin 81 MG tablet Take 81 mg by mouth daily.    [provider]  BD PEN NEEDLE NANO U/F 32G X 4 MM MISC USE EVERY DAY 07/26/17   Elayne Snare, MD  Blood Glucose Monitoring Suppl (ACCU-CHEK GUIDE) w/Device KIT 1 each by Does not apply route 3 (three) times daily. Use accu chek guide device to check blood sugar twice daily. DX:E11.9 07/14/19   Elayne Snare, MD  ENBREL SURECLICK 50 MG/ML injection Inject into the skin. 06/28/20   [provider]  fluticasone (FLONASE) 50 MCG/ACT nasal spray PLACE 2 SPRAYS INTO THE NOSE DAILY. 06/21/20   [name], MD  glimepiride (AMARYL) 1 MG tablet TAKE 1 TABLET (1 MG TOTAL) BY MOUTH DAILY WITH BREAKFAST. 11/08/20   Elayne Snare, MD  glucose blood (ACCU-CHEK GUIDE) test strip Use Accu chek guide test strips as instructed to  check blood sugar three times daily. DX:E11.9 11/28/20   Elayne Snare, MD  lisinopril-hydrochlorothiazide (ZESTORETIC) 20-12.5 MG tablet TAKE 1 TABLET BY MOUTH EVERY DAY 10/20/20   Elayne Snare, MD  meloxicam (MOBIC) 15 MG tablet Take 15 mg by mouth daily. Patient not taking: Reported on 02/10/2021    [provider]  omeprazole (PRILOSEC) 40 MG capsule TAKE 1 CAPSULE BY MOUTH EVERY DAY 12/28/20   [name], MD  Semaglutide, 1 MG/DOSE, (OZEMPIC, 1 MG/DOSE,) 2 MG/1.5ML SOPN INJECT 1 MG INTO THE SKIN ONCE A WEEK. 05/30/20   Elayne Snare, MD  simvastatin (ZOCOR) 20 MG tablet TAKE 1 TABLET BY MOUTH EVERYDAY AT BEDTIME 05/06/20   [name],  MD  SYNJARDY XR 12.11-998 MG TB24 TAKE 2 TABLETS BY MOUTH DAILY WITH BREAKFAST. 09/21/20   Elayne Snare, MD  Testosterone (NATESTO) 5.5 MG/ACT GEL PLACE 1 SPRAY INTO EACH NOSTRIL 3 TIMES A DAY 11/29/20   Elayne Snare, MD  traMADol (ULTRAM) 50 MG tablet Take 1 tablet (50 mg total) by mouth every 6 (six) hours as needed. Patient not taking: Reported on 02/10/2021 05/06/19   Hilts, Legrand Como, MD  VIAGRA 100 MG tablet  04/01/14   [provider]    Family History Family History  Problem Relation Age of Onset   Diabetes Mother    Stomach cancer Other    Diabetes Maternal Aunt    Diabetes Maternal Grandmother    Heart disease Neg Hx    Colon cancer Neg Hx    Esophageal cancer Neg Hx    Rectal cancer Neg Hx     Social History Social History   Tobacco Use   Smoking status: Never   Smokeless tobacco: Never  Vaping Use   Vaping Use: Never used  Substance Use Topics   Alcohol use: Yes    Comment: rare beer   Drug use: Not Currently     Allergies   Sulfa antibiotics, Celebrex [celecoxib], Penicillins, and Sulfonamide derivatives   Review of Systems Review of Systems  Constitutional:  Positive for fatigue and fever.  HENT:  Positive for sore throat.   Respiratory:  Positive for cough.   All other systems reviewed and are negative.   Physical Exam Triage Vital Signs ED Triage Vitals  Enc Vitals Group     BP 02/10/21 1845 103/71     Pulse Rate 02/10/21 1845 (!) 125     Resp 02/10/21 1845 18     Temp 02/10/21 1845 (!) 102.4 F (39.1 C)     Temp Source 02/10/21 1845 Oral     SpO2 02/10/21 1845 95 %     Weight --      Height --      Head Circumference --      Peak Flow --      Pain Score 02/10/21 1846 5     Pain Loc --      Pain Edu? --      Excl. in [place]? --    No data found.  Updated Vital Signs BP 103/71 (BP Location: Left Arm)   Pulse (!) 105   Temp (!) 101.2 F (38.4 C) (Oral)   Resp 18   SpO2 95%       Physical Exam Vitals and nursing note reviewed.   Constitutional:      General: He is not in acute distress.    Appearance: Normal appearance. He is obese. He is ill-appearing.  HENT:     Head: Normocephalic and atraumatic.  Right Ear: Tympanic membrane, ear canal and external ear normal.     Left Ear: Tympanic membrane, ear canal and external ear normal.     Mouth/Throat:     Mouth: Mucous membranes are moist.     Pharynx: Oropharynx is clear.     Comments: Moderate amount of clear drainage of posterior oropharynx noted Eyes:     Extraocular Movements: Extraocular movements intact.     Conjunctiva/sclera: Conjunctivae normal.     Pupils: Pupils are equal, round, and reactive to light.  Cardiovascular:     Rate and Rhythm: Regular rhythm. Tachycardia present.     Pulses: Normal pulses.     Heart sounds: Normal heart sounds.  Pulmonary:     Effort: Pulmonary effort is normal. No respiratory distress.     Breath sounds: No stridor. No wheezing, rhonchi or rales.     Comments: Frequent nonproductive to productive cough noted on exam Musculoskeletal:        General: Normal range of motion.     Cervical back: Normal range of motion and neck supple. Tenderness present.  Lymphadenopathy:     Cervical: No cervical adenopathy.  Skin:    General: Skin is warm and dry.  Neurological:     General: No focal deficit present.     Mental Status: He is alert and oriented to person, place, and time.  Psychiatric:        Mood and Affect: Mood normal.        Behavior: Behavior normal.        Thought Content: Thought content normal.     UC Treatments / Results  Labs (all labs ordered are listed, but only abnormal results are displayed) Labs Reviewed  COVID-19, FLU A+B NAA  POC SARS CORONAVIRUS 2 AG -  ED    EKG   Radiology DG Chest 2 View  Result Date: 02/10/2021 CLINICAL DATA:  Cough congestion EXAM: CHEST - 2 VIEW COMPARISON:  08/01/2020 FINDINGS: The heart size and mediastinal contours are within normal limits. Both lungs  are clear. The visualized skeletal structures are unremarkable. IMPRESSION: No active cardiopulmonary disease. Electronically Signed   By: Donavan Foil M.D.   On: 02/10/2021 19:26    Procedures Procedures (including critical care time)  Medications Ordered in UC Medications  acetaminophen (TYLENOL) tablet 650 mg (650 mg Oral Given 02/10/21 1858)    Initial Impression / Assessment and Plan / UC Course  I have reviewed the triage vital signs and the nursing notes.  Pertinent labs & imaging results that were available during my care of the patient were reviewed by me and considered in my medical decision making (see chart for details).    MDM: 1. Cough-CXR, Rx Tussionex, 2.  Fever-COVID-19/flu AB ordered, 3. Tachycardia-stable, 4.  Allergic rhinitis-Rx'd Allegra.  Discharged home, hemodynamically stable. Final Clinical Impressions(s) / UC Diagnoses   Final diagnoses:  Cough  Fever, unspecified  Tachycardia  Allergic rhinitis, unspecified seasonality, unspecified trigger     Discharge Instructions      Advised/instructed patient may use Tussionex as directed, as needed.  Instructed patient not to start oral prednisone burst until tomorrow, Saturday, 02/11/2021.  Advised patient may take Allegra daily for the next 5 days then as needed.  Advised patient we will follow-up with lab results once received.     ED Prescriptions     Medication Sig Dispense Auth. Provider   chlorpheniramine-HYDROcodone (TUSSIONEX PENNKINETIC ER) 10-8 MG/5ML SUER Take 5 mLs by mouth every 12 (twelve) hours as  needed for up to 7 days for cough. 70 mL Eliezer Lofts, FNP   predniSONE (DELTASONE) 20 MG tablet Take 3 tabs PO daily x 5 days 15 tablet Eliezer Lofts, FNP   fexofenadine [facility] ALLERGY) 180 MG tablet Take 1 tablet (180 mg total) by mouth daily for 15 days. 15 tablet Eliezer Lofts, FNP      I have reviewed the PDMP during this encounter.   Eliezer Lofts, [place] 02/10/21 1945

## 2021-02-10 NOTE — Discharge Instructions (Addendum)
Advised/instructed patient may use Tussionex as directed, as needed.  Instructed patient not to start oral prednisone burst until tomorrow, Saturday, 02/11/2021.  Advised patient may take Allegra daily for the next 5 days then as needed.  Advised patient we will follow-up with lab results once received.

## 2021-02-10 NOTE — ED Triage Notes (Signed)
Sore throat since Tuesday  Cough since Wed COVID 5/22 Temp 102.4 in triage  COVID vaccine + 2 boosters

## 2021-02-14 LAB — COVID-19, FLU A+B NAA
Influenza A, NAA: NOT DETECTED
Influenza B, NAA: NOT DETECTED
SARS-CoV-2, NAA: NOT DETECTED

## 2021-02-20 ENCOUNTER — Other Ambulatory Visit (INDEPENDENT_AMBULATORY_CARE_PROVIDER_SITE_OTHER): Payer: BC Managed Care – PPO

## 2021-02-20 ENCOUNTER — Other Ambulatory Visit: Payer: Self-pay

## 2021-02-20 DIAGNOSIS — E291 Testicular hypofunction: Secondary | ICD-10-CM | POA: Diagnosis not present

## 2021-02-20 DIAGNOSIS — E1165 Type 2 diabetes mellitus with hyperglycemia: Secondary | ICD-10-CM | POA: Diagnosis not present

## 2021-02-20 LAB — COMPREHENSIVE METABOLIC PANEL
ALT: 20 U/L (ref 0–53)
AST: 16 U/L (ref 0–37)
Albumin: 4.1 g/dL (ref 3.5–5.2)
Alkaline Phosphatase: 74 U/L (ref 39–117)
BUN: 15 mg/dL (ref 6–23)
CO2: 30 mEq/L (ref 19–32)
Calcium: 9.8 mg/dL (ref 8.4–10.5)
Chloride: 102 mEq/L (ref 96–112)
Creatinine, Ser: 1.24 mg/dL (ref 0.40–1.50)
GFR: 64.75 mL/min (ref >60.00)
Glucose, Bld: 104 mg/dL — ABNORMAL HIGH (ref 70–99)
Potassium: 4.4 mEq/L (ref 3.5–5.1)
Sodium: 139 mEq/L (ref 135–145)
Total Bilirubin: 0.4 mg/dL (ref 0.2–1.2)
Total Protein: 7.2 g/dL (ref 6.0–8.3)

## 2021-02-20 LAB — HEMOGLOBIN A1C: Hgb A1c MFr Bld: 7.3 % — ABNORMAL HIGH (ref 4.6–6.5)

## 2021-02-20 LAB — LIPID PANEL
Cholesterol: 100 mg/dL (ref 0–200)
HDL: 32.7 mg/dL — ABNORMAL LOW (ref >39.00)
LDL Cholesterol: 51 mg/dL (ref 0–99)
NonHDL: 66.92
Total CHOL/HDL Ratio: 3
Triglycerides: 78 mg/dL (ref 0.0–149.0)
VLDL: 15.6 mg/dL (ref 0.0–40.0)

## 2021-02-20 LAB — TESTOSTERONE: Testosterone: 380.26 ng/dL (ref 300.00–890.00)

## 2021-02-21 ENCOUNTER — Other Ambulatory Visit: Payer: Self-pay | Admitting: Endocrinology

## 2021-02-27 ENCOUNTER — Ambulatory Visit (INDEPENDENT_AMBULATORY_CARE_PROVIDER_SITE_OTHER): Payer: BC Managed Care – PPO | Admitting: Endocrinology

## 2021-02-27 ENCOUNTER — Other Ambulatory Visit: Payer: Self-pay

## 2021-02-27 ENCOUNTER — Ambulatory Visit (INDEPENDENT_AMBULATORY_CARE_PROVIDER_SITE_OTHER): Payer: BC Managed Care – PPO | Admitting: Podiatry

## 2021-02-27 ENCOUNTER — Encounter: Payer: Self-pay | Admitting: Endocrinology

## 2021-02-27 VITALS — BP 130/80 | HR 87 | Ht 70.5 in | Wt 255.6 lb

## 2021-02-27 DIAGNOSIS — I10 Essential (primary) hypertension: Secondary | ICD-10-CM

## 2021-02-27 DIAGNOSIS — M79674 Pain in right toe(s): Secondary | ICD-10-CM | POA: Diagnosis not present

## 2021-02-27 DIAGNOSIS — E785 Hyperlipidemia, unspecified: Secondary | ICD-10-CM

## 2021-02-27 DIAGNOSIS — M79675 Pain in left toe(s): Secondary | ICD-10-CM

## 2021-02-27 DIAGNOSIS — E119 Type 2 diabetes mellitus without complications: Secondary | ICD-10-CM | POA: Diagnosis not present

## 2021-02-27 DIAGNOSIS — E1165 Type 2 diabetes mellitus with hyperglycemia: Secondary | ICD-10-CM | POA: Diagnosis not present

## 2021-02-27 DIAGNOSIS — B351 Tinea unguium: Secondary | ICD-10-CM

## 2021-02-27 DIAGNOSIS — E291 Testicular hypofunction: Secondary | ICD-10-CM

## 2021-02-27 NOTE — Progress Notes (Signed)
Patient ID: Clifford Kramer, male   DOB: 1965-09-04, 55 y.o.   MRN: [id number]    Reason for Appointment : Endocrinology follow-up  History of Present Illness          Diagnosis: Type 2 diabetes mellitus, date of diagnosis: 2005     Background information: He was probably started on metformin at the time of diagnosis when his blood sugars were not very high About 5-6 years ago because of higher blood sugars he was also given glipizide  He appeared to have  progression of his diabetes with A1c going up to 8.1% in 01/2013; also had difficulty with losing weight before he was started on Victoza.  With this his blood sugars were significantly better His glipizide was reduced to 2.5 mg in 10/14 because of rare hypoglycemia    RECENT history:   Antihyperglycemic drugs : Ozempic 1 mg weekly, and Amaryl 1 mg pcs, Synjardy XR 12.11/998, 2 tablets daily at 8 am  Work routine: He starts work at [employer] AM for [employer]  Current management, blood sugar patterns and problems: His A1c is slightly better at 7.3 compared to 7.5  On his last visit he was advised to try and improve his exercise regimen and also diet as well as start checking blood sugars more often  He is doing overall better with his lifestyle as above and has lost 7 pounds  Also has started checking blood sugars regularly at different times of the day  Although he is doing better with his regimen he still having periodic high readings  Some of his high reading may be with foods like cereal  Highest reading 227 after dinner likely on the day he had prednisone He is trying to walk up to 5 days a week and usually going to the gym twice a week  Has been regular with his Ozempic as well as oral medications Also he does not think his blood sugars were significantly high when he had prednisone a couple of weeks ago but has high readings on 7/19  Lab glucose 104   Side effects from medications have been: None        Monitors blood  glucose: once a day or twice          Glucometer:      Accu-Chek  Blood Glucose readings from download show  PRE-MEAL Fasting Lunch 3-9 PM Bedtime Overall  Glucose range: 99-155      Mean/median: 134  157  138   POST-MEAL PC Breakfast PC Lunch PC Dinner  Glucose range:   131, 227  Mean/median:       previous range 117-139, median 127:    LIFESTYLE: Meals: 3 meals per day. Dinner 5 pm, Bfst 9 am; lunch 2 pm;  eating out periodically  His breakfast:  eggs and toast or meat, sometimes fast food sandwiches Usually eating fruit and sandwich at lunch, usually a grilled meat at suppertime   Dietician visit: 05/2013           Wt Readings from Last 3 Encounters:  02/27/21 255 lb 9.6 oz (115.9 kg)  11/28/20 262 lb (118.8 kg)  07/05/20 249 lb (112.9 kg)   Lab Results  Component Value Date   HGBA1C 7.3 (H) 02/20/2021   HGBA1C 7.5 (H) 11/21/2020   HGBA1C 7.0 (H) 06/27/2020   Lab Results  Component Value Date   MICROALBUR <0.7 11/21/2020   LDLCALC 51 02/20/2021   CREATININE 1.24 02/20/2021    Other active  problems including new problems are in review of systems    No visits with results within 1 Week(s) from this visit.  Latest known visit with results is:  Lab on 02/20/2021  Component Date Value Ref Range Status   Testosterone 02/20/2021 380.26  300.00 - 890.00 ng/dL Final   Cholesterol 02/20/2021 100  0 - 200 mg/dL Final   ATP III Classification       Desirable:  < 200 mg/dL               Borderline High:  200 - 239 mg/dL          High:  > = 240 mg/dL   Triglycerides 02/20/2021 78.0  0.0 - 149.0 mg/dL Final   Normal:  <150 mg/dLBorderline High:  150 - 199 mg/dL   HDL 02/20/2021 32.70 (A) >39.00 mg/dL Final   VLDL 02/20/2021 15.6  0.0 - 40.0 mg/dL Final   LDL Cholesterol 02/20/2021 51  0 - 99 mg/dL Final   Total CHOL/HDL Ratio 02/20/2021 3   Final                  Men          Women1/2 Average Risk     3.4          3.3Average Risk          5.0          4.42X Average Risk           9.6          7.13X Average Risk          15.0          11.0                       NonHDL 02/20/2021 66.92   Final   NOTE:  Non-HDL goal should be 30 mg/dL higher than patient's LDL goal (i.e. LDL goal of < 70 mg/dL, would have non-HDL goal of < 100 mg/dL)   Sodium 02/20/2021 139  135 - 145 mEq/L Final   Potassium 02/20/2021 4.4  3.5 - 5.1 mEq/L Final   Chloride 02/20/2021 102  96 - 112 mEq/L Final   CO2 02/20/2021 30  19 - 32 mEq/L Final   Glucose, Bld 02/20/2021 104 (A) 70 - 99 mg/dL Final   BUN 02/20/2021 15  6 - 23 mg/dL Final   Creatinine, Ser 02/20/2021 1.24  0.40 - 1.50 mg/dL Final   Total Bilirubin 02/20/2021 0.4  0.2 - 1.2 mg/dL Final   Alkaline Phosphatase 02/20/2021 74  39 - 117 U/L Final   AST 02/20/2021 16  0 - 37 U/L Final   ALT 02/20/2021 20  0 - 53 U/L Final   Total Protein 02/20/2021 7.2  6.0 - 8.3 g/dL Final   Albumin 02/20/2021 4.1  3.5 - 5.2 g/dL Final   GFR 02/20/2021 64.75  >60.00 mL/min Final   Calculated using the CKD-EPI Creatinine Equation (2021)   Calcium 02/20/2021 9.8  8.4 - 10.5 mg/dL Final   Hgb A1c MFr Bld 02/20/2021 7.3 (A) 4.6 - 6.5 % Final   Glycemic Control Guidelines for People with Diabetes:Non Diabetic:  <6%Goal of Therapy: <7%Additional Action Suggested:  >8%     Allergies as of 02/27/2021       Reactions   Sulfa Antibiotics Itching   REACTION: itching   Celebrex [celecoxib] Itching   REACTION: itching   Penicillins Itching   Sulfonamide Derivatives Itching  REACTION: itching        Medication List        Accurate as of February 27, 2021  9:16 PM. If you have any questions, ask your nurse or doctor.          STOP taking these medications    fexofenadine 180 MG tablet Commonly known as: Allegra Allergy Stopped by: Elayne Snare, MD   meloxicam 15 MG tablet Commonly known as: MOBIC Stopped by: Elayne Snare, MD   predniSONE 20 MG tablet Commonly known as: DELTASONE Stopped by: Elayne Snare, MD   traMADol 50 MG  tablet Commonly known as: ULTRAM Stopped by: Elayne Snare, MD       TAKE these medications    Accu-Chek FastClix Lancets Misc Use to check blood sugar 3 times daily. Dx Code E11.9   Accu-Chek Guide test strip Generic drug: glucose blood Use Accu chek guide test strips as instructed to check blood sugar three times daily. DX:E11.9   Accu-Chek Guide w/Device Kit 1 each by Does not apply route 3 (three) times daily. Use accu chek guide device to check blood sugar twice daily. DX:E11.9   allopurinol 300 MG tablet Commonly known as: ZYLOPRIM Take 1 tablet (300 mg total) by mouth daily.   amLODipine 5 MG tablet Commonly known as: NORVASC Take 1 tablet (5 mg total) by mouth daily.   aspirin 81 MG tablet Take 81 mg by mouth daily.   BD Pen Needle Nano U/F 32G X 4 MM Misc Generic drug: Insulin Pen Needle USE EVERY DAY   Enbrel SureClick 50 MG/ML injection Generic drug: etanercept Inject into the skin.   fluticasone 50 MCG/ACT nasal spray Commonly known as: FLONASE PLACE 2 SPRAYS INTO THE NOSE DAILY.   glimepiride 1 MG tablet Commonly known as: AMARYL TAKE 1 TABLET BY MOUTH DAILY WITH BREAKFAST.   lisinopril-hydrochlorothiazide 20-12.5 MG tablet Commonly known as: ZESTORETIC TAKE 1 TABLET BY MOUTH EVERY DAY   Natesto 5.5 MG/ACT Gel Generic drug: Testosterone PLACE 1 SPRAY INTO EACH NOSTRIL 3 TIMES A DAY   omeprazole 40 MG capsule Commonly known as: PRILOSEC TAKE 1 CAPSULE BY MOUTH EVERY DAY   Ozempic (1 MG/DOSE) 2 MG/1.5ML Sopn Generic drug: Semaglutide (1 MG/DOSE) INJECT 1 MG INTO THE SKIN ONCE A WEEK.   simvastatin 20 MG tablet Commonly known as: ZOCOR TAKE 1 TABLET BY MOUTH EVERYDAY AT BEDTIME   Synjardy XR 12.11-998 MG Tb24 Generic drug: Empagliflozin-metFORMIN HCl ER TAKE 2 TABLETS BY MOUTH DAILY WITH BREAKFAST.   Viagra 100 MG tablet Generic drug: sildenafil        Allergies:  Allergies  Allergen Reactions   Sulfa Antibiotics Itching     REACTION: itching   Celebrex [Celecoxib] Itching    REACTION: itching   Penicillins Itching   Sulfonamide Derivatives Itching    REACTION: itching    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   Gout    Hyperlipidemia    Hypertension    Sleep apnea    CPAP use     Past Surgical History:  Procedure Laterality Date   COLONOSCOPY  2015   KNEE SURGERY Left 2005    Family History  Problem Relation Age of Onset   Diabetes Mother    Stomach cancer Other    Diabetes Maternal Aunt    Diabetes Maternal Grandmother    Heart disease Neg Hx    Colon cancer Neg Hx  Esophageal cancer Neg Hx    Rectal cancer Neg Hx     Social History:  reports that he has never smoked. He has never used smokeless tobacco. He reports current alcohol use. He reports previous drug use.    Review of Systems    HYPERTENSION: This is well controlled with Norvasc 5 mg and  20 mg lisinopril HCTZ  He checks blood pressure at home  Lowest 120/80 otherwise mostly normal   BP Readings from Last 3 Encounters:  02/27/21 130/80  02/10/21 103/71  11/28/20 130/84   Creatinine stable    Lab Results  Component Value Date   CREATININE 1.24 02/20/2021   CREATININE 1.26 11/21/2020   CREATININE 1.25 06/27/2020     GOUT: He has had history of recurrent episodes of probable gout in various joints including knee and elbow in the past Baseline uric acid was 8.4 done in 2012  Taking allopurinol 300 mg daily regularly with last uric acid 3.3  He is taking biologicals for his rheumatoid arthritis from rheumatologist     Hyperlipidemia: LDL has been controlled, baseline previously 109, has been taking simvastatin 20 mg. Has a low HDL  Lab Results  Component Value Date   CHOL 100 02/20/2021   HDL 32.70 (L) 02/20/2021   LDLCALC 51 02/20/2021   TRIG 78.0 02/20/2021   CHOLHDL 3 02/20/2021       HYPOGONADISM:  He has been on  testosterone supplements since 2011 when his level was mildly low around 300  but no further evaluation done.   He has a relatively low testosterone level while using Axiron and had difficulty with the application With Androderm patches he had improved testosterone levels and less fatigue  His testosterone level had been inconsistent with using 8 mg Androderm and was taking the 6 mg total dose  He was getting more skin irritation and sensitivity to the Androderm and this was stopped [place] is covered with his insurance and has been taking this regularly  He has been able to use this twice a day as recommended, 1 dose in each nostril He has been feeling fairly good  Testosterone level previously was normal and now above 300   Lab Results  Component Value Date   TESTOSTERONE 380.26 02/20/2021   Lab Results  Component Value Date   HGB 14.3 11/21/2020     He takes [facility] for erectile dysfunction, also was given Cialis by the [facility] with only intermittent benefit  LABS:  No visits with results within 1 Week(s) from this visit.  Latest known visit with results is:  Lab on 02/20/2021  Component Date Value Ref Range Status   Testosterone 02/20/2021 380.26  300.00 - 890.00 ng/dL Final   Cholesterol 02/20/2021 100  0 - 200 mg/dL Final   ATP III Classification       Desirable:  < 200 mg/dL               Borderline High:  200 - 239 mg/dL          High:  > = 240 mg/dL   Triglycerides 02/20/2021 78.0  0.0 - 149.0 mg/dL Final   Normal:  <150 mg/dLBorderline High:  150 - 199 mg/dL   HDL 02/20/2021 32.70 (A) >39.00 mg/dL Final   VLDL 02/20/2021 15.6  0.0 - 40.0 mg/dL Final   LDL Cholesterol 02/20/2021 51  0 - 99 mg/dL Final   Total CHOL/HDL Ratio 02/20/2021 3   Final  Men          Women1/2 Average Risk     3.4          3.3Average Risk          5.0          4.42X Average Risk          9.6          7.13X Average Risk          15.0          11.0                        NonHDL 02/20/2021 66.92   Final   NOTE:  Non-HDL goal should be 30 mg/dL higher than patient's LDL goal (i.e. LDL goal of < 70 mg/dL, would have non-HDL goal of < 100 mg/dL)   Sodium 02/20/2021 139  135 - 145 mEq/L Final   Potassium 02/20/2021 4.4  3.5 - 5.1 mEq/L Final   Chloride 02/20/2021 102  96 - 112 mEq/L Final   CO2 02/20/2021 30  19 - 32 mEq/L Final   Glucose, Bld 02/20/2021 104 (A) 70 - 99 mg/dL Final   BUN 02/20/2021 15  6 - 23 mg/dL Final   Creatinine, Ser 02/20/2021 1.24  0.40 - 1.50 mg/dL Final   Total Bilirubin 02/20/2021 0.4  0.2 - 1.2 mg/dL Final   Alkaline Phosphatase 02/20/2021 74  39 - 117 U/L Final   AST 02/20/2021 16  0 - 37 U/L Final   ALT 02/20/2021 20  0 - 53 U/L Final   Total Protein 02/20/2021 7.2  6.0 - 8.3 g/dL Final   Albumin 02/20/2021 4.1  3.5 - 5.2 g/dL Final   GFR 02/20/2021 64.75  >60.00 mL/min Final   Calculated using the CKD-EPI Creatinine Equation (2021)   Calcium 02/20/2021 9.8  8.4 - 10.5 mg/dL Final   Hgb A1c MFr Bld 02/20/2021 7.3 (A) 4.6 - 6.5 % Final   Glycemic Control Guidelines for People with Diabetes:Non Diabetic:  <6%Goal of Therapy: <7%Additional Action Suggested:  >8%      Physical Examination:   BP 130/80   Pulse 87   Ht 5' 10.5" (1.791 m)   Wt 255 lb 9.6 oz (115.9 kg)   SpO2 96%   BMI 36.16 kg/m         ASSESSMENT/PLAN:   Diabetes type 2, with obesity  See history of present illness for detailed discussion of his current management, blood sugar patterns and problems identified  His A1c is 7.3, previously 7.5  He is taking Synjardy, Ozempic 1 mg daily and Amaryl 1 mg daily  With lifestyle changes and weight loss his blood pressures are improving However still has occasional high readings although highest readings were on prednisone  No consistent pattern of high blood sugars seen otherwise in his fasting readings are about 130 average   Since he has had only consistent exercise recently and likely can see  further improvement with more weight loss we will continue same regimen If his blood sugars are not improved further we will consider increasing his Ozempic dose up to 2 mg  HYPERTENSION:  His blood pressure is well controlled and improved  Hypogonadism:   He is on Natesto twice a day and no complaints of fatigue With taking this regularly and tolerating it well his testosterone level is now well above 300 He will continue to take 2 doses a day, on waking up  and late evening  Statin treatment: Continues to be regular with treatment, well-tolerated and his LDL is well below 70   Follow-up in 4 months    There are no Patient Instructions on file for this visit.     Elayne Snare 02/27/2021, 9:16 PM

## 2021-02-27 NOTE — Progress Notes (Signed)
   SUBJECTIVE Patient with a history of diabetes mellitus presents to office today complaining of elongated, thickened nails that cause pain while ambulating in shoes.  Patient is unable to trim their own nails.   Patient also presents for routine diabetic foot exam.  Patient is here for further evaluation and treatment.   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   Gout    Hyperlipidemia    Hypertension    Sleep apnea    CPAP use     OBJECTIVE General Patient is awake, alert, and oriented x 3 and in no acute distress. Derm Skin is dry and supple bilateral. Negative open lesions or macerations. Remaining integument unremarkable. Nails are tender, long, thickened and dystrophic with subungual debris, consistent with onychomycosis, 1-5 bilateral. No signs of infection noted. Vasc  DP and PT pedal pulses palpable bilaterally. Temperature gradient within normal limits.  Neuro Epicritic and protective threshold sensation diminished bilaterally.  Musculoskeletal Exam No symptomatic pedal deformities noted bilateral. Muscular strength within normal limits.  ASSESSMENT 1. Diabetes Mellitus w/ peripheral neuropathy 2.  Pain due to onychomycosis of toenails bilateral  PLAN OF CARE 1. Patient evaluated today.  Comprehensive diabetic foot exam performed today.  Patient doing very well. 2. Instructed to maintain good pedal hygiene and foot care. Stressed importance of controlling blood sugar.  3. Mechanical debridement of nails 1-5 bilaterally performed using a nail nipper. Filed with dremel without incident.  4.  Return to clinic annually for routine diabetic foot exam    Edrick Kins, DPM [facility]  Dr. Edrick Kins, DPM    [address], [place] [address]                Office [phone]  Fax [phone]

## 2021-03-01 ENCOUNTER — Ambulatory Visit: Payer: BC Managed Care – PPO | Admitting: Podiatry

## 2021-05-22 ENCOUNTER — Ambulatory Visit (INDEPENDENT_AMBULATORY_CARE_PROVIDER_SITE_OTHER): Payer: BC Managed Care – PPO | Admitting: Family Medicine

## 2021-05-22 ENCOUNTER — Encounter: Payer: Self-pay | Admitting: Family Medicine

## 2021-05-22 ENCOUNTER — Encounter: Payer: BC Managed Care – PPO | Admitting: Family Medicine

## 2021-05-22 ENCOUNTER — Other Ambulatory Visit: Payer: Self-pay

## 2021-05-22 VITALS — BP 112/74 | HR 87 | Temp 97.7°F | Ht 70.5 in | Wt 261.8 lb

## 2021-05-22 DIAGNOSIS — Z125 Encounter for screening for malignant neoplasm of prostate: Secondary | ICD-10-CM | POA: Diagnosis not present

## 2021-05-22 DIAGNOSIS — K429 Umbilical hernia without obstruction or gangrene: Secondary | ICD-10-CM

## 2021-05-22 DIAGNOSIS — Z87898 Personal history of other specified conditions: Secondary | ICD-10-CM

## 2021-05-22 DIAGNOSIS — E782 Mixed hyperlipidemia: Secondary | ICD-10-CM | POA: Diagnosis not present

## 2021-05-22 DIAGNOSIS — I10 Essential (primary) hypertension: Secondary | ICD-10-CM

## 2021-05-22 DIAGNOSIS — E1169 Type 2 diabetes mellitus with other specified complication: Secondary | ICD-10-CM | POA: Diagnosis not present

## 2021-05-22 DIAGNOSIS — Z23 Encounter for immunization: Secondary | ICD-10-CM | POA: Diagnosis not present

## 2021-05-22 DIAGNOSIS — Z1159 Encounter for screening for other viral diseases: Secondary | ICD-10-CM

## 2021-05-22 DIAGNOSIS — Z0000 Encounter for general adult medical examination without abnormal findings: Secondary | ICD-10-CM | POA: Diagnosis not present

## 2021-05-22 DIAGNOSIS — D563 Thalassemia minor: Secondary | ICD-10-CM

## 2021-05-22 MED ORDER — SCOPOLAMINE 1 MG/3DAYS TD PT72: 1.0000 | MEDICATED_PATCH | TRANSDERMAL | 0 refills | Status: DC

## 2021-05-22 NOTE — Progress Notes (Signed)
Subjective:     Clifford Kramer is a 55 y.o. male and is here for a comprehensive physical exam. Naval sticking out more that it used to.  Pt denies pain in abd.   Inquires about yearly EKG.  Had Covid and flu shot 05/13/21.    Social History   Socioeconomic History   Marital status: Married    Spouse name: Not on file   Number of children: Not on file   Years of education: Not on file   Highest education level: Not on file  Occupational History   Not on file  Tobacco Use   Smoking status: Never   Smokeless tobacco: Never  Vaping Use   Vaping Use: Never used  Substance and Sexual Activity   Alcohol use: Yes    Comment: rare beer   Drug use: Not Currently   Sexual activity: Yes  Other Topics Concern   Not on file  Social History Narrative   Not on file   Social Determinants of Health   Financial Resource Strain: Not on file  Food Insecurity: Not on file  Transportation Needs: Not on file  Physical Activity: Not on file  Stress: Not on file  Social Connections: Not on file  Intimate Partner Violence: Not on file   Health Maintenance  Topic Date Due   HIV Screening  Never done   Hepatitis C Screening  Never done   OPHTHALMOLOGY EXAM  09/10/2018   TETANUS/TDAP  04/29/2021   HEMOGLOBIN A1C  08/23/2021   FOOT EXAM  02/27/2022   COLONOSCOPY (Pts 45-53yrs Insurance coverage will need to be confirmed)  10/22/2022   Pneumococcal Vaccine 33-60 Years old (4 - PPSV23 if available, else PCV20) 02/01/2029   INFLUENZA VACCINE  Completed   COVID-19 Vaccine  Completed   Zoster Vaccines- Shingrix  Completed   HPV VACCINES  Aged Out    The following portions of the patient's history were reviewed and updated as appropriate: allergies, current medications, past family history, past medical history, past social history, past surgical history, and problem list.  Review of Systems Pertinent items noted in HPI and remainder of comprehensive ROS otherwise negative.   Objective:     BP 112/74 (BP Location: Left Arm, Patient Position: Sitting, Cuff Size: Large)   Pulse 87   Temp 97.7 F (36.5 C) (Oral)   Ht 5' 10.5" (1.791 m)   Wt 261 lb 12.8 oz (118.8 kg)   SpO2 97%   BMI 37.03 kg/m  General appearance: alert, cooperative, and no distress Head: Normocephalic, without obvious abnormality, atraumatic Eyes: conjunctivae/corneas clear. PERRL, EOM's intact. Fundi benign. Ears: normal TM's and external ear canals both ears Nose: Nares normal. Septum midline. Mucosa normal. No drainage or sinus tenderness. Throat: lips, mucosa, and tongue normal; teeth and gums normal Neck: no adenopathy, no carotid bruit, no JVD, supple, symmetrical, trachea midline, and thyroid not enlarged, symmetric, no tenderness/mass/nodules Lungs: clear to auscultation bilaterally Heart: regular rate and rhythm, S1, S2 normal, no murmur, click, rub or gallop Abdomen: soft, non-tender; bowel sounds normal; no masses,  no organomegaly and umbilical hernia, reducible. Extremities: extremities normal, atraumatic, no cyanosis or edema Pulses: 2+ and symmetric Skin: Skin color, texture, turgor normal. No rashes or lesions Lymph nodes: Cervical, supraclavicular, and axillary nodes normal. Neurologic: Alert and oriented X 3, normal strength and tone. Normal symmetric reflexes. Normal coordination and gait    Assessment:    Healthy male exam with umbilical hernia.     Plan:  Anticipatory guidance given including wearing seatbelts, smoke detectors in the home, increasing physical activity, increasing p.o. intake of water and vegetables. -will obtain labs -colonoscopy up to date, done 10/21/17 -Immunizations reviewed -given handout -next CPE 1 yr See After Visit Summary for Counseling Recommendations   Thalassemia minor  - Plan: CBC with Differential/Platelet  Mixed hyperlipidemia  -continue simvastatin 20 mg daily - Plan: Lipid panel, EKG 03-UDTH  Umbilical hernia without obstruction and  without gangrene  -discussed need for surgical repair. -given handout -given strict precautions - Plan: CBC with Differential/Platelet, Ambulatory referral to General Surgery  Screening for prostate cancer -PSA 0.270 on 05/16/20 - Plan: PSA  Essential hypertension -Controlled -continue norvasc 5 mg, lisinopril-HCTZ 20-12.5 mg dialy -Lifestyle modifications encouraged -EKG with sinus rhythm.  Similar to previous EKGs from 10/01/17 and 09/03/16 with nonspecific ST elevation, L axis deviation. - Plan: EKG 12-Lead  Type 2 diabetes mellitus with other specified complication, without long-term current use of insulin (HCC) Hemoglobin A1c 7.3% 02/20/2021 -Microalbumin done 11/21/2020 -Patient to schedule diabetic retinopathy exam -Foot exam done 02/27/2021 -Continue current medications including glimepiride, Ozempic, Synjardy -Continue follow-up with endocrinology  - Plan: Hemoglobin A1c, EKG 12-Lead  Encounter for hepatitis C screening test for low risk patient  - Plan: Hep C Antibody  F/u in the next few months as needed  Grier Mitts, MD

## 2021-05-22 NOTE — Patient Instructions (Signed)
A referral was placed for you to see the general surgeon in regards to the umbilical hernia.  You should receive a call about scheduling this appt.

## 2021-05-25 ENCOUNTER — Other Ambulatory Visit: Payer: BC Managed Care – PPO

## 2021-05-26 ENCOUNTER — Other Ambulatory Visit: Payer: BC Managed Care – PPO

## 2021-05-26 ENCOUNTER — Other Ambulatory Visit: Payer: Self-pay | Admitting: Family Medicine

## 2021-05-26 ENCOUNTER — Other Ambulatory Visit: Payer: Self-pay

## 2021-05-26 DIAGNOSIS — Z0000 Encounter for general adult medical examination without abnormal findings: Secondary | ICD-10-CM

## 2021-05-26 LAB — HEPATITIS C ANTIBODY
Hepatitis C Ab: NONREACTIVE
SIGNAL TO CUT-OFF: 0.02 (ref <1.00)

## 2021-05-29 ENCOUNTER — Other Ambulatory Visit (INDEPENDENT_AMBULATORY_CARE_PROVIDER_SITE_OTHER): Payer: BC Managed Care – PPO

## 2021-05-29 ENCOUNTER — Other Ambulatory Visit: Payer: Self-pay

## 2021-05-29 DIAGNOSIS — Z0000 Encounter for general adult medical examination without abnormal findings: Secondary | ICD-10-CM | POA: Diagnosis not present

## 2021-05-29 LAB — CBC WITH DIFFERENTIAL/PLATELET
Basophils Absolute: 0 10*3/uL (ref 0.0–0.1)
Basophils Relative: 0.4 % (ref 0.0–3.0)
Eosinophils Absolute: 0.1 10*3/uL (ref 0.0–0.7)
Eosinophils Relative: 2.4 % (ref 0.0–5.0)
HCT: 44.3 % (ref 39.0–52.0)
Hemoglobin: 14.4 g/dL (ref 13.0–17.0)
Lymphocytes Relative: 40.5 % (ref 12.0–46.0)
Lymphs Abs: 2.4 10*3/uL (ref 0.7–4.0)
MCHC: 32.6 g/dL (ref 30.0–36.0)
MCV: 79.6 fl (ref 78.0–100.0)
Monocytes Absolute: 0.6 10*3/uL (ref 0.1–1.0)
Monocytes Relative: 9.7 % (ref 3.0–12.0)
Neutro Abs: 2.7 10*3/uL (ref 1.4–7.7)
Neutrophils Relative %: 47 % (ref 43.0–77.0)
Platelets: 246 10*3/uL (ref 150.0–400.0)
RBC: 5.56 Mil/uL (ref 4.22–5.81)
RDW: 14.9 % (ref 11.5–15.5)
WBC: 5.8 10*3/uL (ref 4.0–10.5)

## 2021-05-29 LAB — T4, FREE: Free T4: 0.6 ng/dL (ref 0.60–1.60)

## 2021-05-29 LAB — PSA: PSA: 0.21 ng/mL (ref 0.10–4.00)

## 2021-05-29 LAB — LIPID PANEL
Cholesterol: 103 mg/dL (ref 0–200)
HDL: 31.3 mg/dL — ABNORMAL LOW (ref >39.00)
LDL Cholesterol: 52 mg/dL (ref 0–99)
NonHDL: 71.29
Total CHOL/HDL Ratio: 3
Triglycerides: 94 mg/dL (ref 0.0–149.0)
VLDL: 18.8 mg/dL (ref 0.0–40.0)

## 2021-05-29 LAB — TSH: TSH: 3.31 u[IU]/mL (ref 0.35–5.50)

## 2021-05-29 LAB — HEMOGLOBIN A1C: Hgb A1c MFr Bld: 6.9 % — ABNORMAL HIGH (ref 4.6–6.5)

## 2021-06-01 ENCOUNTER — Other Ambulatory Visit: Payer: Self-pay | Admitting: Family Medicine

## 2021-06-01 DIAGNOSIS — I10 Essential (primary) hypertension: Secondary | ICD-10-CM

## 2021-06-16 ENCOUNTER — Other Ambulatory Visit: Payer: Self-pay | Admitting: Endocrinology

## 2021-06-26 ENCOUNTER — Other Ambulatory Visit (INDEPENDENT_AMBULATORY_CARE_PROVIDER_SITE_OTHER): Payer: BC Managed Care – PPO

## 2021-06-26 ENCOUNTER — Other Ambulatory Visit: Payer: Self-pay

## 2021-06-26 ENCOUNTER — Other Ambulatory Visit: Payer: Self-pay | Admitting: Family Medicine

## 2021-06-26 ENCOUNTER — Other Ambulatory Visit: Payer: Self-pay | Admitting: Endocrinology

## 2021-06-26 DIAGNOSIS — E1165 Type 2 diabetes mellitus with hyperglycemia: Secondary | ICD-10-CM

## 2021-06-26 DIAGNOSIS — E291 Testicular hypofunction: Secondary | ICD-10-CM

## 2021-06-26 LAB — BASIC METABOLIC PANEL
BUN: 20 mg/dL (ref 6–23)
CO2: 25 mEq/L (ref 19–32)
Calcium: 9.5 mg/dL (ref 8.4–10.5)
Chloride: 104 mEq/L (ref 96–112)
Creatinine, Ser: 1.23 mg/dL (ref 0.40–1.50)
GFR: 65.22 mL/min (ref >60.00)
Glucose, Bld: 91 mg/dL (ref 70–99)
Potassium: 4.3 mEq/L (ref 3.5–5.1)
Sodium: 139 mEq/L (ref 135–145)

## 2021-06-26 LAB — CBC
HCT: 43.6 % (ref 39.0–52.0)
Hemoglobin: 14.1 g/dL (ref 13.0–17.0)
MCHC: 32.4 g/dL (ref 30.0–36.0)
MCV: 78.4 fl (ref 78.0–100.0)
Platelets: 227 10*3/uL (ref 150.0–400.0)
RBC: 5.56 Mil/uL (ref 4.22–5.81)
RDW: 15.2 % (ref 11.5–15.5)
WBC: 5.6 10*3/uL (ref 4.0–10.5)

## 2021-06-26 LAB — HEMOGLOBIN A1C: Hgb A1c MFr Bld: 7 % — ABNORMAL HIGH (ref 4.6–6.5)

## 2021-06-26 LAB — TESTOSTERONE: Testosterone: 315.51 ng/dL (ref 300.00–890.00)

## 2021-07-03 ENCOUNTER — Ambulatory Visit (INDEPENDENT_AMBULATORY_CARE_PROVIDER_SITE_OTHER): Payer: BC Managed Care – PPO | Admitting: Endocrinology

## 2021-07-03 ENCOUNTER — Other Ambulatory Visit: Payer: Self-pay

## 2021-07-03 ENCOUNTER — Encounter: Payer: Self-pay | Admitting: Endocrinology

## 2021-07-03 VITALS — BP 110/70 | HR 111 | Ht 70.5 in | Wt 262.2 lb

## 2021-07-03 DIAGNOSIS — I10 Essential (primary) hypertension: Secondary | ICD-10-CM

## 2021-07-03 DIAGNOSIS — E291 Testicular hypofunction: Secondary | ICD-10-CM | POA: Diagnosis not present

## 2021-07-03 DIAGNOSIS — E1165 Type 2 diabetes mellitus with hyperglycemia: Secondary | ICD-10-CM | POA: Diagnosis not present

## 2021-07-03 NOTE — Progress Notes (Signed)
Patient ID: Clifford Kramer, male   DOB: Dec 07, 1965, 55 y.o.   MRN: [id number]    Reason for Appointment : Endocrinology follow-up  History of Present Illness          Diagnosis: Type 2 diabetes mellitus, date of diagnosis: 2005     Background information: He was probably started on metformin at the time of diagnosis when his blood sugars were not very high About 5-6 years ago because of higher blood sugars he was also given glipizide  He appeared to have  progression of his diabetes with A1c going up to 8.1% in 01/2013; also had difficulty with losing weight before he was started on Victoza.  With this his blood sugars were significantly better His glipizide was reduced to 2.5 mg in 10/14 because of rare hypoglycemia    RECENT history:   Antihyperglycemic drugs : Ozempic 1 mg weekly, and Amaryl 1 mg acs, Synjardy XR 12.11/998, 2 tablets daily at 8 am  Work routine: He starts work at [employer] AM for [employer]  Current management, blood sugar patterns and problems: His A1c is slightly better at 7  On his last visit he had lost weight but appears to have gained back what he had lost  He may not have exercise as consistently based on the weather  Also diet may not have been consistent over the last few weeks  As before he is checking blood sugars sporadically and none after dinner recently  Most of his glucose testing is usually midmorning  He is trying to take his Amaryl up to an hour before dinner but now says that occasionally when his blood sugars are near normal he will not take it for fear of low sugars  Only once he felt his sugar starting to get low when he was about to eat dinner  Fasting readings at home are in the low 100 range and 91 in the lab although this was late in the morning He is trying to walk up to 5 days a week and usually going to the gym twice a week  Has been regular with his Ozempic as well as oral medications    Side effects from medications have been: None         Monitors blood glucose: once a day or twice          Glucometer:      Accu-Chek  Blood Glucose readings from download show   PRE-MEAL Fasting Lunch Dinner Bedtime Overall  Glucose range: 115, 117  128    Mean/median:     129   POST-MEAL PC Breakfast PC Lunch PC Dinner  Glucose range: 144-156    Mean/median:      Prior  PRE-MEAL Fasting Lunch 3-9 PM Bedtime Overall  Glucose range: 99-155      Mean/median: 134  157  138   POST-MEAL PC Breakfast PC Lunch PC Dinner  Glucose range:   131, 227  Mean/median:        LIFESTYLE: Meals: 3 meals per day. Dinner 5 pm, Bfst 9 am; lunch 2 pm;  eating out periodically  His breakfast:  eggs and toast or meat, sometimes fast food sandwiches Usually eating fruit and sandwich at lunch, usually a grilled meat at suppertime   Dietician visit: 05/2013           Wt Readings from Last 3 Encounters:  07/03/21 262 lb 3.2 oz (118.9 kg)  05/22/21 261 lb 12.8 oz (118.8 kg)  02/27/21 255  lb 9.6 oz (115.9 kg)   Lab Results  Component Value Date   HGBA1C 7.0 (H) 06/26/2021   HGBA1C 6.9 (H) 05/29/2021   HGBA1C 7.3 (H) 02/20/2021   Lab Results  Component Value Date   MICROALBUR <0.7 11/21/2020   LDLCALC 52 05/29/2021   CREATININE 1.23 06/26/2021    Other active problems including new problems are in review of systems    No visits with results within 1 Week(s) from this visit.  Latest known visit with results is:  Lab on 06/26/2021  Component Date Value Ref Range Status   WBC 06/26/2021 5.6  4.0 - 10.5 K/uL Final   RBC 06/26/2021 5.56  4.22 - 5.81 Mil/uL Final   Platelets 06/26/2021 227.0  150.0 - 400.0 K/uL Final   Hemoglobin 06/26/2021 14.1  13.0 - 17.0 g/dL Final   HCT 06/26/2021 43.6  39.0 - 52.0 % Final   MCV 06/26/2021 78.4  78.0 - 100.0 fl Final   MCHC 06/26/2021 32.4  30.0 - 36.0 g/dL Final   RDW 06/26/2021 15.2  11.5 - 15.5 % Final   Testosterone 06/26/2021 315.51  300.00 - 890.00 ng/dL Final   Sodium 06/26/2021 139   135 - 145 mEq/L Final   Potassium 06/26/2021 4.3  3.5 - 5.1 mEq/L Final   Chloride 06/26/2021 104  96 - 112 mEq/L Final   CO2 06/26/2021 25  19 - 32 mEq/L Final   Glucose, Bld 06/26/2021 91  70 - 99 mg/dL Final   BUN 06/26/2021 20  6 - 23 mg/dL Final   Creatinine, Ser 06/26/2021 1.23  0.40 - 1.50 mg/dL Final   GFR 06/26/2021 65.22  >60.00 mL/min Final   Calculated using the CKD-EPI Creatinine Equation (2021)   Calcium 06/26/2021 9.5  8.4 - 10.5 mg/dL Final   Hgb A1c MFr Bld 06/26/2021 7.0 (H)  4.6 - 6.5 % Final   Glycemic Control Guidelines for People with Diabetes:Non Diabetic:  <6%Goal of Therapy: <7%Additional Action Suggested:  >8%     Allergies as of 07/03/2021       Reactions   Sulfa Antibiotics Itching   REACTION: itching   Celebrex [celecoxib] Itching   REACTION: itching   Penicillins Itching   Sulfonamide Derivatives Itching   REACTION: itching        Medication List        Accurate as of July 03, 2021  1:12 PM. If you have any questions, ask your nurse or doctor.          Accu-Chek FastClix Lancets Misc Use to check blood sugar 3 times daily. Dx Code E11.9   Accu-Chek Guide test strip Generic drug: glucose blood Use Accu chek guide test strips as instructed to check blood sugar three times daily. DX:E11.9   Accu-Chek Guide w/Device Kit 1 each by Does not apply route 3 (three) times daily. Use accu chek guide device to check blood sugar twice daily. DX:E11.9   allopurinol 300 MG tablet Commonly known as: ZYLOPRIM Take 1 tablet (300 mg total) by mouth daily.   amLODipine 5 MG tablet Commonly known as: NORVASC TAKE 1 TABLET BY MOUTH EVERY DAY   aspirin 81 MG tablet Take 81 mg by mouth daily.   BD Pen Needle Nano U/F 32G X 4 MM Misc Generic drug: Insulin Pen Needle USE EVERY DAY   Enbrel SureClick 50 MG/ML injection Generic drug: etanercept Inject into the skin.   fluticasone 50 MCG/ACT nasal spray Commonly known as: FLONASE PLACE 2 SPRAYS  INTO THE  NOSE DAILY.   glimepiride 1 MG tablet Commonly known as: AMARYL TAKE 1 TABLET BY MOUTH DAILY WITH BREAKFAST.   lisinopril-hydrochlorothiazide 20-12.5 MG tablet Commonly known as: ZESTORETIC TAKE 1 TABLET BY MOUTH EVERY DAY   Natesto 5.5 MG/ACT Gel Generic drug: Testosterone PLACE 1 SPRAY INTO EACH NOSTRIL 3 TIMES A DAY   omeprazole 40 MG capsule Commonly known as: PRILOSEC TAKE 1 CAPSULE BY MOUTH EVERY DAY   Ozempic (1 MG/DOSE) 2 MG/1.5ML Sopn Generic drug: Semaglutide (1 MG/DOSE) INJECT 1 MG INTO THE SKIN ONCE A WEEK.   scopolamine 1 MG/3DAYS Commonly known as: Transderm-Scop (1.5 MG) Place 1 patch (1.5 mg total) onto the skin every 3 (three) days.   simvastatin 20 MG tablet Commonly known as: ZOCOR TAKE 1 TABLET BY MOUTH EVERYDAY AT BEDTIME   Synjardy XR 12.11-998 MG Tb24 Generic drug: Empagliflozin-metFORMIN HCl ER TAKE 2 TABLETS BY MOUTH DAILY WITH BREAKFAST.   Viagra 100 MG tablet Generic drug: sildenafil        Allergies:  Allergies  Allergen Reactions   Sulfa Antibiotics Itching    REACTION: itching   Celebrex [Celecoxib] Itching    REACTION: itching   Penicillins Itching   Sulfonamide Derivatives Itching    REACTION: itching    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   Gout    Hyperlipidemia    Hypertension    Sleep apnea    CPAP use     Past Surgical History:  Procedure Laterality Date   COLONOSCOPY  2015   KNEE SURGERY Left 2005    Family History  Problem Relation Age of Onset   Diabetes Mother    Stomach cancer Other    Diabetes Maternal Aunt    Diabetes Maternal Grandmother    Heart disease Neg Hx    Colon cancer Neg Hx    Esophageal cancer Neg Hx    Rectal cancer Neg Hx     Social History:  reports that he has never smoked. He has never used smokeless tobacco. He reports current alcohol use. He reports that he does not currently use  drugs.    Review of Systems    HYPERTENSION: This is well controlled with Norvasc 5 mg and  20 mg lisinopril HCTZ  He recently has not checked blood pressure at home  He is feeling lightheaded when he is stooping but no other time  BP Readings from Last 3 Encounters:  07/03/21 110/70  05/22/21 112/74  02/27/21 130/80   Creatinine stable    Lab Results  Component Value Date   CREATININE 1.23 06/26/2021   CREATININE 1.24 02/20/2021   CREATININE 1.26 11/21/2020     GOUT: He has had history of recurrent episodes of probable gout in various joints including knee and elbow in the past Baseline uric acid was 8.4 done in 2012  Taking allopurinol 300 mg daily regularly with last uric acid 3.3  He is taking Enbrel for his rheumatoid arthritis from rheumatologist     Hyperlipidemia: LDL has been controlled, baseline previously 109, has been taking simvastatin 20 mg. Has a low HDL  Lab Results  Component Value Date   CHOL 103 05/29/2021   HDL 31.30 (L) 05/29/2021   LDLCALC 52 05/29/2021   TRIG 94.0 05/29/2021   CHOLHDL 3 05/29/2021       HYPOGONADISM:  He has been on testosterone supplements since 2011 when his level was mildly low  around 300  but no further evaluation done.   He has a relatively low testosterone level while using Axiron and had difficulty with the application With Androderm patches he had improved testosterone levels and less fatigue  His testosterone level had been inconsistent with using 8 mg Androderm and was taking the 6 mg total dose  He was getting more skin irritation and sensitivity to the Androderm and this was changed to NATESTO  He has been consistent with taking this twice a day as recommended, 1 dose in each nostril at about 7 AM and 7 PM  No recent unusual fatigue, occasionally may feel tired when his having insomnia  Testosterone level previously was normal and now above 300   Lab Results  Component Value Date   TESTOSTERONE 315.51  06/26/2021   Lab Results  Component Value Date   HGB 14.1 06/26/2021     He takes Viagra/ Cialis for erectile dysfunction, also was given Cialis by the [facility] with only intermittent benefit  EYE exams: These are done at the [facility] and no reports available, he is due in January  LABS:  No visits with results within 1 Week(s) from this visit.  Latest known visit with results is:  Lab on 06/26/2021  Component Date Value Ref Range Status   WBC 06/26/2021 5.6  4.0 - 10.5 K/uL Final   RBC 06/26/2021 5.56  4.22 - 5.81 Mil/uL Final   Platelets 06/26/2021 227.0  150.0 - 400.0 K/uL Final   Hemoglobin 06/26/2021 14.1  13.0 - 17.0 g/dL Final   HCT 06/26/2021 43.6  39.0 - 52.0 % Final   MCV 06/26/2021 78.4  78.0 - 100.0 fl Final   MCHC 06/26/2021 32.4  30.0 - 36.0 g/dL Final   RDW 06/26/2021 15.2  11.5 - 15.5 % Final   Testosterone 06/26/2021 315.51  300.00 - 890.00 ng/dL Final   Sodium 06/26/2021 139  135 - 145 mEq/L Final   Potassium 06/26/2021 4.3  3.5 - 5.1 mEq/L Final   Chloride 06/26/2021 104  96 - 112 mEq/L Final   CO2 06/26/2021 25  19 - 32 mEq/L Final   Glucose, Bld 06/26/2021 91  70 - 99 mg/dL Final   BUN 06/26/2021 20  6 - 23 mg/dL Final   Creatinine, Ser 06/26/2021 1.23  0.40 - 1.50 mg/dL Final   GFR 06/26/2021 65.22  >60.00 mL/min Final   Calculated using the CKD-EPI Creatinine Equation (2021)   Calcium 06/26/2021 9.5  8.4 - 10.5 mg/dL Final   Hgb A1c MFr Bld 06/26/2021 7.0 (H)  4.6 - 6.5 % Final   Glycemic Control Guidelines for People with Diabetes:Non Diabetic:  <6%Goal of Therapy: <7%Additional Action Suggested:  >8%      Physical Examination:   BP 110/70 (BP Location: Left Arm, Patient Position: Sitting, Cuff Size: Normal)   Pulse (!) 111   Ht 5' 10.5" (1.791 m)   Wt 262 lb 3.2 oz (118.9 kg)   SpO2 95%   BMI 37.09 kg/m         ASSESSMENT/PLAN:   Diabetes type 2, with obesity  See history of present illness for detailed discussion  of his current management, blood sugar patterns and problems identified  His A1c is 7 and improving  He is taking Synjardy, Ozempic 1 mg daily and Amaryl 1 mg daily  Blood sugars overall are generally better although weight has gone back up slightly recently Home monitoring is inconsistent and not enough reading after his main meals Currently  on Ozempic 1 mg and able to get this fairly regularly at the pharmacy  HYPERTENSION:  His blood pressure is again low normal, not sure if his occasional lightheaded symptoms are related to lower BP We will try to reduce his AMLODIPINE to half a tablet for now and recheck blood pressure at home regularly  Last foot exam done by PCP  Hypogonadism:   He is on Natesto twice a day Subjectively doing well  Again testosterone level is over 300 He will continue to take 2 doses a day as before  Eye exams: He will need to have his report forwarded to [place] from the [facility]  Follow-up in 4 months    There are no Patient Instructions on file for this visit.     Elayne Snare 07/03/2021, 1:12 PM

## 2021-07-03 NOTE — Patient Instructions (Addendum)
Take Amlodipine in 1/2 dose  Check blood sugars on waking up 2-3 days a week  Also check blood sugars about 2 hours after meals and do this after different meals by rotation  Recommended blood sugar levels on waking up are 90-130 and about 2 hours after meal is 130-160  Please bring your blood sugar monitor to each visit, thank you  Bring eye exam report

## 2021-07-13 ENCOUNTER — Other Ambulatory Visit: Payer: Self-pay | Admitting: Family Medicine

## 2021-07-19 ENCOUNTER — Other Ambulatory Visit: Payer: Self-pay | Admitting: Surgery

## 2021-07-19 DIAGNOSIS — K429 Umbilical hernia without obstruction or gangrene: Secondary | ICD-10-CM

## 2021-07-28 ENCOUNTER — Other Ambulatory Visit: Payer: Self-pay | Admitting: Endocrinology

## 2021-08-21 ENCOUNTER — Ambulatory Visit
Admission: RE | Admit: 2021-08-21 | Discharge: 2021-08-21 | Disposition: A | Discharge status: Home / Self Care | Payer: BC Managed Care – PPO | Source: Ambulatory Visit | Attending: Surgery | Admitting: Surgery

## 2021-08-21 ENCOUNTER — Other Ambulatory Visit: Payer: Self-pay

## 2021-08-21 DIAGNOSIS — K429 Umbilical hernia without obstruction or gangrene: Secondary | ICD-10-CM

## 2021-08-21 MED ORDER — IOPAMIDOL (ISOVUE-300) INJECTION 61%
100.0000 mL | Freq: Once | INTRAVENOUS | Status: AC | PRN
  Administered 2021-08-21: 100 mL via INTRAVENOUS

## 2021-10-10 ENCOUNTER — Other Ambulatory Visit: Payer: Self-pay | Admitting: Endocrinology

## 2021-10-13 NOTE — Pre-Procedure Instructions (Signed)
Surgical Instructions ? ? ? Your procedure is scheduled on Friday, October 20, 2021 at 9:30 AM. ? Report to [facility] Main Entrance "A" at 7:30 A.M., then check in with the Admitting office. ? Call this number if you have problems the morning of surgery: ? [phone] ? ? If you have any questions prior to your surgery date call [phone]: Open Monday-Friday 8am-4pm ? ? ? Remember: ? Do not eat after midnight the night before your surgery ? ?You may drink clear liquids until 6:30 AM the morning of your surgery.   ?Clear liquids allowed are: Water, Non-Citrus Juices (without pulp), Carbonated Beverages, Clear Tea, Black Coffee Only (NO MILK, CREAM OR POWDERED CREAMER of any kind), and Gatorade. ?  ? Take these medicines the morning of surgery with A SIP OF WATER: ? ?allopurinol (ZYLOPRIM) ?amLODipine (NORVASC) ?fluticasone (FLONASE) ?omeprazole (PRILOSEC) ? ?Follow your surgeon's instructions on when to stop Aspirin.  If no instructions were given by your surgeon then you will need to call the office to get those instructions.   ? ? ?As of today, STOP taking any Aleve, Naproxen, Ibuprofen, Motrin, Advil, Goody's, BC's, all herbal medications, fish oil, and all vitamins. ? ?WHAT DO I DO ABOUT MY DIABETES MEDICATION? ? ? ?Do not take glimepiride (AMARYL) or OZEMPIC the morning of surgery. ? ?Do not take SYNJARDY XR the day before surgery (3/23) or the morning of surgery (3/24). ? ? ?HOW TO MANAGE YOUR DIABETES ?BEFORE AND AFTER SURGERY ? ?Why is it important to control my blood sugar before and after surgery? ?Improving blood sugar levels before and after surgery helps healing and can limit problems. ?A way of improving blood sugar control is eating a healthy diet by: ? Eating less sugar and carbohydrates ? Increasing activity/exercise ? Talking with your doctor about reaching your blood sugar goals ?High blood sugars (greater than 180 mg/dL) can raise your risk of infections and slow your recovery, so you will need  to focus on controlling your diabetes during the weeks before surgery. ?Make sure that the doctor who takes care of your diabetes knows about your planned surgery including the date and location. ? ?How do I manage my blood sugar before surgery? ?Check your blood sugar at least 4 times a day, starting 2 days before surgery, to make sure that the level is not too high or low. ? ?Check your blood sugar the morning of your surgery when you wake up and every 2 hours until you get to the Short Stay unit. ? ?If your blood sugar is less than 70 mg/dL, you will need to treat for low blood sugar: ?Do not take insulin. ?Treat a low blood sugar (less than 70 mg/dL) with ? cup of clear juice (cranberry or apple), 4 glucose tablets, OR glucose gel. ?Recheck blood sugar in 15 minutes after treatment (to make sure it is greater than 70 mg/dL). If your blood sugar is not greater than 70 mg/dL on recheck, call [phone] for further instructions. ?Report your blood sugar to the short stay nurse when you get to Short Stay. ? ?If you are admitted to the hospital after surgery: ?Your blood sugar will be checked by the staff and you will probably be given insulin after surgery (instead of oral diabetes medicines) to make sure you have good blood sugar levels. ?The goal for blood sugar control after surgery is 80-180 mg/dL. ? ?         ?           ?  Do NOT Smoke (Tobacco/Vaping) for 24 hours prior to your procedure. ? ?If you use a CPAP at night, you may bring your mask/headgear for your overnight stay. ?  ?Contacts, glasses, piercing's, hearing aid's, dentures or partials may not be worn into surgery, please bring cases for these belongings.  ?  ?For patients admitted to the hospital, discharge time will be determined by your treatment team. ?  ?Patients discharged the day of surgery will not be allowed to drive home, and someone needs to stay with them for 24 hours. ? ?NO VISITORS WILL BE ALLOWED IN PRE-OP WHERE PATIENTS ARE PREPPED  FOR SURGERY.  ONLY 1 SUPPORT PERSON MAY BE PRESENT IN THE WAITING ROOM WHILE YOU ARE IN SURGERY.  IF YOU ARE TO BE ADMITTED, ONCE YOU ARE IN YOUR ROOM YOU WILL BE ALLOWED TWO (2) VISITORS. (1) VISITOR MAY STAY OVERNIGHT BUT MUST ARRIVE TO THE ROOM BY 8pm.  Minor children may have two parents present. Special consideration for safety and communication needs will be reviewed on a case by case basis. ? ? ?Special instructions:   ?[place]- Preparing For Surgery ? ?Before surgery, you can play an important role. Because skin is not sterile, your skin needs to be as free of germs as possible. You can reduce the number of germs on your skin by washing with CHG (chlorahexidine gluconate) Soap before surgery.  CHG is an antiseptic cleaner which kills germs and bonds with the skin to continue killing germs even after washing.   ? ?Oral Hygiene is also important to reduce your risk of infection.  Remember - BRUSH YOUR TEETH THE MORNING OF SURGERY WITH YOUR REGULAR TOOTHPASTE ? ?Please do not use if you have an allergy to CHG or antibacterial soaps. If your skin becomes reddened/irritated stop using the CHG.  ?Do not shave (including legs and underarms) for at least 48 hours prior to first CHG shower. It is OK to shave your face. ? ?Please follow these instructions carefully. ?  ?Shower the NIGHT BEFORE SURGERY and the MORNING OF SURGERY ? ?If you chose to wash your hair, wash your hair first as usual with your normal shampoo. ? ?After you shampoo, rinse your hair and body thoroughly to remove the shampoo. ? ?Use CHG Soap as you would any other liquid soap. You can apply CHG directly to the skin and wash gently with a scrungie or a clean washcloth.  ? ?Apply the CHG Soap to your body ONLY FROM THE NECK DOWN.  Do not use on open wounds or open sores. Avoid contact with your eyes, ears, mouth and genitals (private parts). Wash Face and genitals (private parts)  with your normal soap.  ? ?Wash thoroughly, paying special  attention to the area where your surgery will be performed. ? ?Thoroughly rinse your body with warm water from the neck down. ? ?DO NOT shower/wash with your normal soap after using and rinsing off the CHG Soap. ? ?Pat yourself dry with a CLEAN TOWEL. ? ?Wear CLEAN PAJAMAS to bed the night before surgery ? ?Place CLEAN SHEETS on your bed the night before your surgery ? ?DO NOT SLEEP WITH PETS. ? ? ?Day of Surgery: ?Take a shower with CHG soap. ?Do not wear jewelry. ?Do not wear lotions, powders, colognes, or deodorant. ?Do not shave 48 hours prior to surgery.  Men may shave face and neck. ?Do not bring valuables to the hospital.  ?[facility] is not responsible for any belongings or valuables. ?Wear Clean/Comfortable clothing  the morning of surgery ?Do not apply any deodorants/lotions.   ?Remember to brush your teeth WITH YOUR REGULAR TOOTHPASTE. ?  ?Please read over the following fact sheets that you were given. ?

## 2021-10-16 ENCOUNTER — Encounter (HOSPITAL_COMMUNITY)
Admission: RE | Admit: 2021-10-16 | Discharge: 2021-10-16 | Disposition: A | Discharge status: Home / Self Care | Payer: BC Managed Care – PPO | Source: Ambulatory Visit | Attending: Surgery | Admitting: Surgery

## 2021-10-16 ENCOUNTER — Other Ambulatory Visit: Payer: Self-pay

## 2021-10-16 ENCOUNTER — Encounter (HOSPITAL_COMMUNITY): Payer: Self-pay

## 2021-10-16 ENCOUNTER — Ambulatory Visit: Payer: Self-pay | Admitting: Surgery

## 2021-10-16 VITALS — BP 120/82 | HR 87 | Temp 98.0°F | Resp 17 | Ht 71.0 in | Wt 268.1 lb

## 2021-10-16 DIAGNOSIS — I10 Essential (primary) hypertension: Secondary | ICD-10-CM | POA: Insufficient documentation

## 2021-10-16 DIAGNOSIS — E785 Hyperlipidemia, unspecified: Secondary | ICD-10-CM | POA: Insufficient documentation

## 2021-10-16 DIAGNOSIS — Z8616 Personal history of COVID-19: Secondary | ICD-10-CM | POA: Insufficient documentation

## 2021-10-16 DIAGNOSIS — Z6837 Body mass index (BMI) 37.0-37.9, adult: Secondary | ICD-10-CM | POA: Diagnosis not present

## 2021-10-16 DIAGNOSIS — E119 Type 2 diabetes mellitus without complications: Secondary | ICD-10-CM | POA: Insufficient documentation

## 2021-10-16 DIAGNOSIS — Z01818 Encounter for other preprocedural examination: Secondary | ICD-10-CM

## 2021-10-16 DIAGNOSIS — K439 Ventral hernia without obstruction or gangrene: Secondary | ICD-10-CM | POA: Insufficient documentation

## 2021-10-16 DIAGNOSIS — Z01812 Encounter for preprocedural laboratory examination: Secondary | ICD-10-CM | POA: Diagnosis present

## 2021-10-16 DIAGNOSIS — M109 Gout, unspecified: Secondary | ICD-10-CM | POA: Diagnosis not present

## 2021-10-16 DIAGNOSIS — Z9989 Dependence on other enabling machines and devices: Secondary | ICD-10-CM | POA: Diagnosis not present

## 2021-10-16 DIAGNOSIS — G4733 Obstructive sleep apnea (adult) (pediatric): Secondary | ICD-10-CM | POA: Diagnosis not present

## 2021-10-16 DIAGNOSIS — E669 Obesity, unspecified: Secondary | ICD-10-CM | POA: Insufficient documentation

## 2021-10-16 HISTORY — DX: Rheumatoid arthritis, unspecified: M06.9

## 2021-10-16 LAB — BASIC METABOLIC PANEL
Anion gap: 9 (ref 5–15)
BUN: 16 mg/dL (ref 6–20)
CO2: 25 mmol/L (ref 22–32)
Calcium: 9.1 mg/dL (ref 8.9–10.3)
Chloride: 104 mmol/L (ref 98–111)
Creatinine, Ser: 1.24 mg/dL (ref 0.61–1.24)
GFR, Estimated: >60 mL/min (ref >60)
Glucose, Bld: 131 mg/dL — ABNORMAL HIGH (ref 70–99)
Potassium: 4.4 mmol/L (ref 3.5–5.1)
Sodium: 138 mmol/L (ref 135–145)

## 2021-10-16 LAB — CBC
HCT: 42.2 % (ref 39.0–52.0)
Hemoglobin: 14.5 g/dL (ref 13.0–17.0)
MCH: 24.9 pg — ABNORMAL LOW (ref 26.0–34.0)
MCHC: 34.4 g/dL (ref 30.0–36.0)
MCV: 72.5 fL — ABNORMAL LOW (ref 80.0–100.0)
Platelets: 247 10*3/uL (ref 150–400)
RBC: 5.82 MIL/uL — ABNORMAL HIGH (ref 4.22–5.81)
RDW: 14.9 % (ref 11.5–15.5)
WBC: 5.7 10*3/uL (ref 4.0–10.5)
nRBC: 0 % (ref 0.0–0.2)

## 2021-10-16 LAB — HEMOGLOBIN A1C
Hgb A1c MFr Bld: 6.9 % — ABNORMAL HIGH (ref 4.8–5.6)
Mean Plasma Glucose: 151.33 mg/dL

## 2021-10-16 LAB — GLUCOSE, CAPILLARY: Glucose-Capillary: 135 mg/dL — ABNORMAL HIGH (ref 70–99)

## 2021-10-16 NOTE — Progress Notes (Signed)
PCP - Grier Mitts, MD ?Cardiologist - denies ?Endocrinologist- Elayne Snare, MD- manages patient's diabetes ? ?Chest x-ray - N/A ?EKG - 05/22/21  ?Stress Test - denies ?ECHO - denies ?Cardiac Cath - denies ? ?Sleep Study - 2011 ?CPAP - Pressure setting 10.5 ? ?Fasting Blood Sugar - CBG 135 in PAT, pt reports that he checks his blood sugar twice a day. Pt reports blood sugars typically 135-145 in AM and 100 around 3pm. Checking Hgb A1c today in PAT.  ? ?Aspirin Instructions: Pt instructed to contact surgeon's office regarding stopping Aspirin. Patient states he was given no information about this.  ? ?Pt states he was not given any instructions regarding Enbrel use in peri-operative period. Patient's last dose of Enbrel 10/09/21. Pt instructed to contact surgeon's office regarding instructions on Enbrel. Patient states his next dose is due today 10/16/21.  ? ?ERAS Protcol - not ordered ?COVID TEST- N/A- pt denies exposure or symptoms of Covid.  ? ? ?Anesthesia review: review EKG ? ?Patient denies shortness of breath, fever, cough and chest pain at PAT appointment ? ? ?All instructions explained to the patient, with a verbal understanding of the material. Patient agrees to go over the instructions while at home for a better understanding. Patient also instructed to self quarantine after being tested for COVID-19. The opportunity to ask questions was provided. ? ? ?

## 2021-10-16 NOTE — Pre-Procedure Instructions (Addendum)
Surgical Instructions ? ? ? Your procedure is scheduled on Friday, October 20, 2021 at 9:30 AM. ? Report to [facility] Main Entrance "A" at 7:30 A.M., then check in with the Admitting office. ? Call this number if you have problems the morning of surgery: ? [phone] ? ? If you have any questions prior to your surgery date call [phone]: Open Monday-Friday 8am-4pm ? ? ? Remember: ? Do not eat or drink anything after midnight the night before your surgery ?  ? Take these medicines the morning of surgery with A SIP OF WATER: ? ?allopurinol (ZYLOPRIM) ?amLODipine (NORVASC) ?fluticasone (FLONASE) ?omeprazole (PRILOSEC) ? ?Follow your surgeon's instructions on when to stop Aspirin.  If no instructions were given by your surgeon then you will need to call the office to get those instructions.   ? ?Follow your surgeon's instructions on stopping Enbrel. If no instructions were given by your surgeon, then please call surgeon's office for instructions.  ? ?As of today, STOP taking any Aleve, Naproxen, Ibuprofen, Motrin, Advil, Goody's, BC's, all herbal medications, fish oil, and all vitamins. ? ?WHAT DO I DO ABOUT MY DIABETES MEDICATION? ? ? ?Do not take glimepiride (AMARYL) or OZEMPIC the morning of surgery. ?DO NOT take glimepiride (Amaryl) the night before surgery.  ? ?Do not take SYNJARDY XR the day before surgery (3/23) or the morning of surgery (3/24). ? ? ?HOW TO MANAGE YOUR DIABETES ?BEFORE AND AFTER SURGERY ? ?Why is it important to control my blood sugar before and after surgery? ?Improving blood sugar levels before and after surgery helps healing and can limit problems. ?A way of improving blood sugar control is eating a healthy diet by: ? Eating less sugar and carbohydrates ? Increasing activity/exercise ? Talking with your doctor about reaching your blood sugar goals ?High blood sugars (greater than 180 mg/dL) can raise your risk of infections and slow your recovery, so you will need to focus on controlling  your diabetes during the weeks before surgery. ?Make sure that the doctor who takes care of your diabetes knows about your planned surgery including the date and location. ? ?How do I manage my blood sugar before surgery? ?Check your blood sugar at least 4 times a day, starting 2 days before surgery, to make sure that the level is not too high or low. ? ?Check your blood sugar the morning of your surgery when you wake up and every 2 hours until you get to the Short Stay unit. ? ?If your blood sugar is less than 70 mg/dL, you will need to treat for low blood sugar: ?Do not take insulin. ?Treat a low blood sugar (less than 70 mg/dL) with ? cup of clear juice (cranberry or apple), 4 glucose tablets, OR glucose gel. ?Recheck blood sugar in 15 minutes after treatment (to make sure it is greater than 70 mg/dL). If your blood sugar is not greater than 70 mg/dL on recheck, call [phone] for further instructions. ?Report your blood sugar to the short stay nurse when you get to Short Stay. ? ?If you are admitted to the hospital after surgery: ?Your blood sugar will be checked by the staff and you will probably be given insulin after surgery (instead of oral diabetes medicines) to make sure you have good blood sugar levels. ?The goal for blood sugar control after surgery is 80-180 mg/dL. ? ?         ?           ?Do NOT Smoke (Tobacco/Vaping) for  24 hours prior to your procedure. ? ?If you use a CPAP at night, you may bring your mask/headgear for your overnight stay. ?  ?Contacts, glasses, piercing's, hearing aid's, dentures or partials may not be worn into surgery, please bring cases for these belongings.  ?  ?For patients admitted to the hospital, discharge time will be determined by your treatment team. ?  ?Patients discharged the day of surgery will not be allowed to drive home, and someone needs to stay with them for 24 hours. ? ?NO VISITORS WILL BE ALLOWED IN PRE-OP WHERE PATIENTS ARE PREPPED FOR SURGERY.  ONLY 1  SUPPORT PERSON MAY BE PRESENT IN THE WAITING ROOM WHILE YOU ARE IN SURGERY.  IF YOU ARE TO BE ADMITTED, ONCE YOU ARE IN YOUR ROOM YOU WILL BE ALLOWED TWO (2) VISITORS. (1) VISITOR MAY STAY OVERNIGHT BUT MUST ARRIVE TO THE ROOM BY 8pm.  Minor children may have two parents present. Special consideration for safety and communication needs will be reviewed on a case by case basis. ? ? ?Special instructions:   ?[facility]- Preparing For Surgery ? ?Before surgery, you can play an important role. Because skin is not sterile, your skin needs to be as free of germs as possible. You can reduce the number of germs on your skin by washing with CHG (chlorahexidine gluconate) Soap before surgery.  CHG is an antiseptic cleaner which kills germs and bonds with the skin to continue killing germs even after washing.   ? ?Oral Hygiene is also important to reduce your risk of infection.  Remember - BRUSH YOUR TEETH THE MORNING OF SURGERY WITH YOUR REGULAR TOOTHPASTE ? ?Please do not use if you have an allergy to CHG or antibacterial soaps. If your skin becomes reddened/irritated stop using the CHG.  ?Do not shave (including legs and underarms) for at least 48 hours prior to first CHG shower. It is OK to shave your face. ? ?Please follow these instructions carefully. ?  ?Shower the NIGHT BEFORE SURGERY and the MORNING OF SURGERY ? ?If you chose to wash your hair, wash your hair first as usual with your normal shampoo. ? ?After you shampoo, rinse your hair and body thoroughly to remove the shampoo. ? ?Use CHG Soap as you would any other liquid soap. You can apply CHG directly to the skin and wash gently with a scrungie or a clean washcloth.  ? ?Apply the CHG Soap to your body ONLY FROM THE NECK DOWN.  Do not use on open wounds or open sores. Avoid contact with your eyes, ears, mouth and genitals (private parts). Wash Face and genitals (private parts)  with your normal soap.  ? ?Wash thoroughly, paying special attention to the area where  your surgery will be performed. ? ?Thoroughly rinse your body with warm water from the neck down. ? ?DO NOT shower/wash with your normal soap after using and rinsing off the CHG Soap. ? ?Pat yourself dry with a CLEAN TOWEL. ? ?Wear CLEAN PAJAMAS to bed the night before surgery ? ?Place CLEAN SHEETS on your bed the night before your surgery ? ?DO NOT SLEEP WITH PETS. ? ? ?Day of Surgery: ?Take a shower with CHG soap. ?Do not wear jewelry. ?Do not wear lotions, powders, colognes, or deodorant. ?Do not shave 48 hours prior to surgery.  Men may shave face and neck. ?Do not bring valuables to the hospital.  ? is not responsible for any belongings or valuables. ?Wear Clean/Comfortable clothing the morning of surgery ?Do  not apply any deodorants/lotions.   ?Remember to brush your teeth WITH YOUR REGULAR TOOTHPASTE. ?  ?Please read over the following fact sheets that you were given. ?

## 2021-10-17 ENCOUNTER — Encounter (HOSPITAL_COMMUNITY): Payer: Self-pay

## 2021-10-17 NOTE — Anesthesia Preprocedure Evaluation (Addendum)
Anesthesia Evaluation  ?Patient identified by MRN, date of birth, ID band ?Patient awake ? ? ? ?Reviewed: ?Allergy & Precautions, H&P , NPO status , Patient's Chart, lab work & pertinent test results ? ?Airway ?Mallampati: II ? ? ?Neck ROM: full ? ? ? Dental ? ?(+) Dental Advisory Given, Teeth Intact ?  ?Pulmonary ?sleep apnea ,  ?  ?breath sounds clear to auscultation ? ? ? ? ? ? Cardiovascular ?hypertension,  ?Rhythm:regular Rate:Normal ? ? ?  ?Neuro/Psych ?PSYCHIATRIC DISORDERS Anxiety   ? GI/Hepatic ?  ?Endo/Other  ?diabetes, Type 2 ? Renal/GU ?  ? ?  ?Musculoskeletal ? ?(+) Arthritis , Rheumatoid disorders,   ? Abdominal ?  ?Peds ? Hematology ?  ?Anesthesia Other Findings ? ? Reproductive/Obstetrics ? ?  ? ? ? ? ? ? ? ? ? ? ? ? ? ?  ?  ? ? ? ? ? ? ?Anesthesia Physical ?Anesthesia Plan ? ?ASA: 2 ? ?Anesthesia Plan: General  ? ?Post-op Pain Management:   ? ?Induction: Intravenous ? ?PONV Risk Score and Plan: 2 and Ondansetron, Dexamethasone, Midazolam and Treatment may vary due to age or medical condition ? ?Airway Management Planned: Oral ETT ? ?Additional Equipment:  ? ?Intra-op Plan:  ? ?Post-operative Plan: Extubation in OR ? ?Informed Consent: I have reviewed the patients History and Physical, chart, labs and discussed the procedure including the risks, benefits and alternatives for the proposed anesthesia with the patient or authorized representative who has indicated his/her understanding and acceptance.  ? ? ? ?Dental advisory given ? ?Plan Discussed with: CRNA, Anesthesiologist and Surgeon ? ?Anesthesia Plan Comments: (PAT note written 10/17/2021 by Myra Gianotti, PA-C. ?)  ? ? ? ? ? ?Anesthesia Quick Evaluation ? ?

## 2021-10-17 NOTE — Progress Notes (Signed)
Anesthesia Chart Review: ? Case: [id number] Date/Time: 10/20/21 0915  ? Procedure: LAPAROSCOPIC VENTRAL HERNIA REPAIR WITH MESH  ? Anesthesia type: General  ? Pre-op diagnosis: VENTRAL HERNIA  ? Location: [facility] ROOM 02 / [facility] OR  ? Surgeons: Jesusita Oka, MD  ? ?  ? ? ?DISCUSSION: Patient is a 56 year old male scheduled for the above procedure. ? 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  BMI is consistent with obesity. ? ?Last visit with PCP Dr. Volanda Napoleon on 05/22/2021.  She referred him to general surgery for repair of an umbilical hernia. ? ?On 10/16/21, patient advised to follow-up with surgeon regarding perioperative ASA and Enbrel instructions.  ? ?A1c 6.9%.  He denied shortness of breath, cough, fever, chest pain at PAT RN visit.  Anesthesia team to evaluate on the day of surgery. ? ? ?VS: BP 120/82 Comment: manual  Pulse 87   Temp 36.7 ?C (Oral)   Resp 17   Ht '5\' 11"'  (1.803 m)   Wt 121.6 kg   SpO2 98%   BMI 37.39 kg/m?  ? ? ?PROVIDERS: ?[name], MD is PCP. He also receives some care through [facility]. ?Elayne Snare, MD is endocrinologist ?Maylene Roes is rheumatology provider [facility]) ? ? ?LABS: Labs reviewed: Acceptable for surgery. ?(all labs ordered are listed, but only abnormal results are displayed) ? ?Labs Reviewed  ?GLUCOSE, CAPILLARY - Abnormal; Notable for the following components:  ?    Result Value  ? Glucose-Capillary 135 (*)   ? All other components within normal limits  ?BASIC METABOLIC PANEL - Abnormal; Notable for the following components:  ? Glucose, Bld 131 (*)   ? All other components within normal limits  ?CBC - Abnormal; Notable for the following components:  ? RBC 5.82 (*)   ? MCV 72.5 (*)   ? MCH 24.9 (*)   ? All other components within normal limits  ?HEMOGLOBIN A1C - Abnormal; Notable for the following  components:  ? Hgb A1c MFr Bld 6.9 (*)   ? All other components within normal limits  ? ? ? ?IMAGES: ?CT Abd/pelvis 08/21/21 (ordered by Dr. Bobbye Morton) ?IMPRESSION: ?- Small umbilical hernia, which contains only omental fat. ?- Ill-defined opacity within the gastrohepatic ligament, and adjacent ?to the splenic flexure of colon and pancreatic tail. This is ?nonspecific, but raises suspicious for inflammatory process, ?possibly due to epiploic appendagitis or distal pancreatitis. ?Suggest correlation with serum amylase and lipase levels, and ?recommend continued follow-up by CT in 2 months. ?- Mild hepatic steatosis. ? ? ?EKG: 05/22/21: ?Normal sinus rhythm ?Possible left atrial enlargement ?Left axis deviation ?- Overall EKG appears stable when compared to 10/01/2017 tracing ? ? ?CV: N/A ? ?Past Medical History:  ?Diagnosis Date  ? COVID-19 11/2020  ? Diabetes mellitus   ? 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 Gout   ? Hyperlipidemia   ? Hypertension   ? Sleep apnea   ? CPAP use   ? ? ?Past Surgical History:  ?Procedure Laterality Date  ? COLONOSCOPY  2015  ? KNEE SURGERY Left 2005  ? ? ?MEDICATIONS: ? Accu-Chek FastClix Lancets MISC  ? allopurinol (ZYLOPRIM) 300 MG tablet  ? amLODipine (NORVASC) 5 MG tablet  ? aspirin 81 MG tablet  ? BD PEN NEEDLE NANO U/F 32G X 4 MM MISC  ? Blood Glucose Monitoring Suppl (ACCU-CHEK GUIDE)  w/Device KIT  ? ENBREL SURECLICK 50 MG/ML injection  ? fluticasone (FLONASE) 50 MCG/ACT nasal spray  ? glimepiride (AMARYL) 1 MG tablet  ? glucose blood (ACCU-CHEK GUIDE) test strip  ? ibuprofen (ADVIL) 200 MG tablet  ? lisinopril-hydrochlorothiazide (ZESTORETIC) 20-12.5 MG tablet  ? NATESTO 5.5 MG/ACT GEL  ? omeprazole (PRILOSEC) 40 MG capsule  ? OZEMPIC, 1 MG/DOSE, 4 MG/3ML SOPN  ? simvastatin (ZOCOR) 20 MG tablet  ? SYNJARDY XR 12.11-998 MG TB24  ? VIAGRA 100 MG tablet  ? ? 0.9 %  sodium chloride infusion  ? ? ?Myra Gianotti, PA-C ?Surgical Short Stay/Anesthesiology ?[facility] Phone [phone] ?[facility]  Phone [phone] ?10/17/2021 2:59 PM ? ? ? ? ? ?

## 2021-10-18 ENCOUNTER — Encounter: Payer: Self-pay | Admitting: Endocrinology

## 2021-10-18 ENCOUNTER — Encounter: Payer: Self-pay | Admitting: Family Medicine

## 2021-10-20 ENCOUNTER — Ambulatory Visit (HOSPITAL_COMMUNITY): Payer: BC Managed Care – PPO | Admitting: Certified Registered Nurse Anesthetist

## 2021-10-20 ENCOUNTER — Ambulatory Visit (HOSPITAL_COMMUNITY): Payer: BC Managed Care – PPO | Admitting: Vascular Surgery

## 2021-10-20 ENCOUNTER — Encounter (HOSPITAL_COMMUNITY)
Admission: RE | Disposition: A | Discharge status: Home / Self Care | Payer: Self-pay | Source: Home / Self Care | Attending: Surgery

## 2021-10-20 ENCOUNTER — Other Ambulatory Visit: Payer: Self-pay

## 2021-10-20 ENCOUNTER — Ambulatory Visit (HOSPITAL_COMMUNITY)
Admission: RE | Admit: 2021-10-20 | Discharge: 2021-10-20 | Disposition: A | Discharge status: Home / Self Care | Payer: BC Managed Care – PPO | Attending: Surgery | Admitting: Surgery

## 2021-10-20 ENCOUNTER — Encounter (HOSPITAL_COMMUNITY): Payer: Self-pay | Admitting: Surgery

## 2021-10-20 DIAGNOSIS — K420 Umbilical hernia with obstruction, without gangrene: Secondary | ICD-10-CM | POA: Insufficient documentation

## 2021-10-20 DIAGNOSIS — E119 Type 2 diabetes mellitus without complications: Secondary | ICD-10-CM | POA: Insufficient documentation

## 2021-10-20 DIAGNOSIS — G4730 Sleep apnea, unspecified: Secondary | ICD-10-CM | POA: Diagnosis not present

## 2021-10-20 DIAGNOSIS — K439 Ventral hernia without obstruction or gangrene: Secondary | ICD-10-CM | POA: Diagnosis present

## 2021-10-20 HISTORY — PX: VENTRAL HERNIA REPAIR: SHX424

## 2021-10-20 HISTORY — PX: INSERTION OF MESH: SHX5868

## 2021-10-20 LAB — GLUCOSE, CAPILLARY
Glucose-Capillary: 145 mg/dL — ABNORMAL HIGH (ref 70–99)
Glucose-Capillary: 123 mg/dL — ABNORMAL HIGH (ref 70–99)
Glucose-Capillary: 130 mg/dL — ABNORMAL HIGH (ref 70–99)

## 2021-10-20 SURGERY — REPAIR, HERNIA, VENTRAL, LAPAROSCOPIC
Anesthesia: General | Site: Abdomen

## 2021-10-20 MED ORDER — INSULIN ASPART 100 UNIT/ML IJ SOLN
0.0000 [IU] | INTRAMUSCULAR | Status: DC | PRN
  Administered 2021-10-20: 2 [IU] via SUBCUTANEOUS

## 2021-10-20 MED ORDER — PROPOFOL 10 MG/ML IV BOLUS
INTRAVENOUS | Status: DC | PRN
  Administered 2021-10-20: 200 mg via INTRAVENOUS

## 2021-10-20 MED ORDER — CEFAZOLIN IN SODIUM CHLORIDE 3-0.9 GM/100ML-% IV SOLN
3.0000 g | INTRAVENOUS | Status: AC
  Administered 2021-10-20: 3 g via INTRAVENOUS
  Filled 2021-10-20: qty 100

## 2021-10-20 MED ORDER — ONDANSETRON HCL 4 MG/2ML IJ SOLN
INTRAMUSCULAR | Status: DC | PRN
  Administered 2021-10-20: 4 mg via INTRAVENOUS

## 2021-10-20 MED ORDER — ONDANSETRON HCL 4 MG/2ML IJ SOLN: 4.0000 mg | Freq: Four times a day (QID) | INTRAMUSCULAR | Status: DC | PRN

## 2021-10-20 MED ORDER — ACETAMINOPHEN 500 MG PO TABS: 1000.0000 mg | ORAL_TABLET | Freq: Four times a day (QID) | ORAL | 1 refills | Status: AC | PRN

## 2021-10-20 MED ORDER — OXYCODONE HCL 5 MG PO TABS: 5.0000 mg | ORAL_TABLET | ORAL | 0 refills | Status: DC | PRN

## 2021-10-20 MED ORDER — 0.9 % SODIUM CHLORIDE (POUR BTL) OPTIME
TOPICAL | Status: DC | PRN
  Administered 2021-10-20: 1000 mL

## 2021-10-20 MED ORDER — MIDAZOLAM HCL 5 MG/5ML IJ SOLN
INTRAMUSCULAR | Status: DC | PRN
  Administered 2021-10-20: 2 mg via INTRAVENOUS

## 2021-10-20 MED ORDER — OXYCODONE HCL 5 MG PO TABS
5.0000 mg | ORAL_TABLET | Freq: Once | ORAL | Status: AC | PRN
  Administered 2021-10-20: 5 mg via ORAL

## 2021-10-20 MED ORDER — FENTANYL CITRATE (PF) 250 MCG/5ML IJ SOLN
INTRAMUSCULAR | Status: AC
  Filled 2021-10-20: qty 5

## 2021-10-20 MED ORDER — FENTANYL CITRATE (PF) 100 MCG/2ML IJ SOLN
25.0000 ug | INTRAMUSCULAR | Status: DC | PRN
  Administered 2021-10-20 (×2): 50 ug via INTRAVENOUS

## 2021-10-20 MED ORDER — MIDAZOLAM HCL 2 MG/2ML IJ SOLN
INTRAMUSCULAR | Status: AC
  Filled 2021-10-20: qty 2

## 2021-10-20 MED ORDER — LIDOCAINE 2% (20 MG/ML) 5 ML SYRINGE
INTRAMUSCULAR | Status: DC | PRN
  Administered 2021-10-20: 60 mg via INTRAVENOUS

## 2021-10-20 MED ORDER — ORAL CARE MOUTH RINSE: 15.0000 mL | Freq: Once | OROMUCOSAL | Status: AC

## 2021-10-20 MED ORDER — BUPIVACAINE LIPOSOME 1.3 % IJ SUSP
20.0000 mL | Freq: Once | INTRAMUSCULAR | Status: DC
  Filled 2021-10-20 (×2): qty 20

## 2021-10-20 MED ORDER — BUPIVACAINE-EPINEPHRINE (PF) 0.25% -1:200000 IJ SOLN
INTRAMUSCULAR | Status: AC
  Filled 2021-10-20: qty 30

## 2021-10-20 MED ORDER — ROCURONIUM BROMIDE 10 MG/ML (PF) SYRINGE
PREFILLED_SYRINGE | INTRAVENOUS | Status: AC
  Filled 2021-10-20: qty 10

## 2021-10-20 MED ORDER — ROCURONIUM BROMIDE 10 MG/ML (PF) SYRINGE
PREFILLED_SYRINGE | INTRAVENOUS | Status: DC | PRN
Start: 2021-10-20 — End: 2021-10-20
  Administered 2021-10-20: 60 mg via INTRAVENOUS

## 2021-10-20 MED ORDER — FENTANYL CITRATE (PF) 100 MCG/2ML IJ SOLN
INTRAMUSCULAR | Status: AC
  Filled 2021-10-20: qty 2

## 2021-10-20 MED ORDER — LIDOCAINE 2% (20 MG/ML) 5 ML SYRINGE
INTRAMUSCULAR | Status: AC
  Filled 2021-10-20: qty 5

## 2021-10-20 MED ORDER — DEXAMETHASONE SODIUM PHOSPHATE 10 MG/ML IJ SOLN
INTRAMUSCULAR | Status: AC
  Filled 2021-10-20: qty 1

## 2021-10-20 MED ORDER — CHLORHEXIDINE GLUCONATE CLOTH 2 % EX PADS: 6.0000 | MEDICATED_PAD | Freq: Once | CUTANEOUS | Status: DC

## 2021-10-20 MED ORDER — BUPIVACAINE-EPINEPHRINE 0.25% -1:200000 IJ SOLN
INTRAMUSCULAR | Status: DC | PRN
  Administered 2021-10-20: 30 mL

## 2021-10-20 MED ORDER — OXYCODONE HCL 5 MG PO TABS
ORAL_TABLET | ORAL | Status: AC
  Filled 2021-10-20: qty 1

## 2021-10-20 MED ORDER — INSULIN ASPART 100 UNIT/ML IJ SOLN
INTRAMUSCULAR | Status: AC
  Filled 2021-10-20: qty 1

## 2021-10-20 MED ORDER — CHLORHEXIDINE GLUCONATE 0.12 % MT SOLN
15.0000 mL | Freq: Once | OROMUCOSAL | Status: AC
  Administered 2021-10-20: 15 mL via OROMUCOSAL
  Filled 2021-10-20: qty 15

## 2021-10-20 MED ORDER — SUGAMMADEX SODIUM 200 MG/2ML IV SOLN
INTRAVENOUS | Status: DC | PRN
  Administered 2021-10-20: 200 mg via INTRAVENOUS

## 2021-10-20 MED ORDER — FENTANYL CITRATE (PF) 250 MCG/5ML IJ SOLN
INTRAMUSCULAR | Status: DC | PRN
  Administered 2021-10-20 (×2): 50 ug via INTRAVENOUS
  Administered 2021-10-20: 100 ug via INTRAVENOUS

## 2021-10-20 MED ORDER — LACTATED RINGERS IV SOLN: INTRAVENOUS | Status: DC

## 2021-10-20 MED ORDER — PROPOFOL 10 MG/ML IV BOLUS
INTRAVENOUS | Status: AC
  Filled 2021-10-20: qty 20

## 2021-10-20 MED ORDER — DOCUSATE SODIUM 100 MG PO CAPS: 100.0000 mg | ORAL_CAPSULE | Freq: Two times a day (BID) | ORAL | 2 refills | Status: DC

## 2021-10-20 MED ORDER — OXYCODONE HCL 5 MG/5ML PO SOLN: 5.0000 mg | Freq: Once | ORAL | Status: AC | PRN

## 2021-10-20 MED ORDER — ONDANSETRON HCL 4 MG/2ML IJ SOLN
INTRAMUSCULAR | Status: AC
  Filled 2021-10-20: qty 2

## 2021-10-20 MED ORDER — METHOCARBAMOL 750 MG PO TABS: 750.0000 mg | ORAL_TABLET | Freq: Four times a day (QID) | ORAL | 1 refills | Status: DC

## 2021-10-20 MED ORDER — IBUPROFEN 600 MG PO TABS: 600.0000 mg | ORAL_TABLET | Freq: Four times a day (QID) | ORAL | 1 refills | Status: DC

## 2021-10-20 MED ORDER — DEXAMETHASONE SODIUM PHOSPHATE 10 MG/ML IJ SOLN
INTRAMUSCULAR | Status: DC | PRN
Start: 2021-10-20 — End: 2021-10-20
  Administered 2021-10-20: 8 mg via INTRAVENOUS

## 2021-10-20 SURGICAL SUPPLY — 50 items
ADH SKN CLS APL DERMABOND .7 (GAUZE/BANDAGES/DRESSINGS) ×1
APL PRP STRL LF DISP 70% ISPRP (MISCELLANEOUS) ×1
BAG COUNTER SPONGE SURGICOUNT (BAG) ×2 IMPLANT
BAG SPNG CNTER NS LX DISP (BAG) ×1
BINDER ABDOMINAL 12 ML 46-62 (SOFTGOODS) ×1 IMPLANT
CANISTER SUCT 3000ML PPV (MISCELLANEOUS) IMPLANT
CHLORAPREP W/TINT 26 (MISCELLANEOUS) ×2 IMPLANT
COVER SURGICAL LIGHT HANDLE (MISCELLANEOUS) ×2 IMPLANT
DERMABOND ADVANCED (GAUZE/BANDAGES/DRESSINGS) ×1
DERMABOND ADVANCED .7 DNX12 (GAUZE/BANDAGES/DRESSINGS) ×1 IMPLANT
DEVICE SECURE STRAP 25 ABSORB (INSTRUMENTS) ×2 IMPLANT
DRAPE INCISE IOBAN 66X45 STRL (DRAPES) ×1 IMPLANT
ELECT CAUTERY BLADE 6.4 (BLADE) ×2 IMPLANT
ELECT REM PT RETURN 9FT ADLT (ELECTROSURGICAL) ×2
ELECTRODE REM PT RTRN 9FT ADLT (ELECTROSURGICAL) ×1 IMPLANT
GAUZE 4X4 16PLY ~~LOC~~+RFID DBL (SPONGE) ×1 IMPLANT
GLOVE SURG ENC MOIS LTX SZ6.5 (GLOVE) ×2 IMPLANT
GLOVE SURG UNDER POLY LF SZ6 (GLOVE) ×2 IMPLANT
GOWN STRL REUS W/ TWL LRG LVL3 (GOWN DISPOSABLE) ×3 IMPLANT
GOWN STRL REUS W/TWL LRG LVL3 (GOWN DISPOSABLE) ×6
GRASPER SUT TROCAR 14GX15 (MISCELLANEOUS) ×2 IMPLANT
KIT BASIN OR (CUSTOM PROCEDURE TRAY) ×2 IMPLANT
KIT TURNOVER KIT B (KITS) ×2 IMPLANT
MARKER SKIN DUAL TIP RULER LAB (MISCELLANEOUS) ×2 IMPLANT
MESH VENTRALIGHT ST 4X6IN (Mesh General) ×1 IMPLANT
NDL INSUFFLATION 14GA 120MM (NEEDLE) ×1 IMPLANT
NDL SPNL 22GX3.5 QUINCKE BK (NEEDLE) ×1 IMPLANT
NEEDLE INSUFFLATION 14GA 120MM (NEEDLE) ×2 IMPLANT
NEEDLE SPNL 22GX3.5 QUINCKE BK (NEEDLE) ×2 IMPLANT
NS IRRIG 1000ML POUR BTL (IV SOLUTION) ×2 IMPLANT
PAD ARMBOARD 7.5X6 YLW CONV (MISCELLANEOUS) ×4 IMPLANT
PENCIL BUTTON HOLSTER BLD 10FT (ELECTRODE) ×2 IMPLANT
SCISSORS LAP 5X35 DISP (ENDOMECHANICALS) IMPLANT
SET IRRIG TUBING LAPAROSCOPIC (IRRIGATION / IRRIGATOR) IMPLANT
SET TUBE SMOKE EVAC HIGH FLOW (TUBING) ×2 IMPLANT
SHEARS HARMONIC ACE PLUS 36CM (ENDOMECHANICALS) IMPLANT
SLEEVE ENDOPATH XCEL 5M (ENDOMECHANICALS) ×4 IMPLANT
SPONGE T-LAP 18X18 ~~LOC~~+RFID (SPONGE) ×1 IMPLANT
SUT MNCRL AB 4-0 PS2 18 (SUTURE) ×2 IMPLANT
SUT NOVA 1 T20/GS 25DT (SUTURE) ×2 IMPLANT
SUT VIC AB 3-0 SH 27 (SUTURE)
SUT VIC AB 3-0 SH 27XBRD (SUTURE) IMPLANT
TOWEL GREEN STERILE (TOWEL DISPOSABLE) ×2 IMPLANT
TOWEL GREEN STERILE FF (TOWEL DISPOSABLE) ×2 IMPLANT
TRAY FOLEY W/BAG SLVR 16FR (SET/KITS/TRAYS/PACK)
TRAY FOLEY W/BAG SLVR 16FR ST (SET/KITS/TRAYS/PACK) IMPLANT
TRAY LAPAROSCOPIC MC (CUSTOM PROCEDURE TRAY) ×2 IMPLANT
TROCAR XCEL NON-BLD 11X100MML (ENDOMECHANICALS) IMPLANT
TROCAR XCEL NON-BLD 5MMX100MML (ENDOMECHANICALS) ×2 IMPLANT
WATER STERILE IRR 1000ML POUR (IV SOLUTION) ×2 IMPLANT

## 2021-10-20 NOTE — H&P (Signed)
? ? ?  Clifford Kramer is an 56 y.o. male.  ? ?HPI: 9M with [facility], plan for lap VHR with mesh. Has held ASA/Enbrel all week. The patient has had no hospitalizations, doctors visits, ER visits, surgeries, or newly diagnosed allergies since being seen in the office.  ? ? ?Past Medical History:  ?Diagnosis Date  ? COVID-19 11/2020  ? Diabetes mellitus   ? 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 Gout   ? Hyperlipidemia   ? Hypertension   ? RA (rheumatoid arthritis) ([place])   ? Sleep apnea   ? CPAP use   ? ? ?Past Surgical History:  ?Procedure Laterality Date  ? COLONOSCOPY  2015  ? KNEE SURGERY Left 2005  ? ? ?Family History  ?Problem Relation Age of Onset  ? Diabetes Mother   ? Stomach cancer Other   ? Diabetes Maternal Aunt   ? Diabetes Maternal Grandmother   ? Heart disease Neg Hx   ? Colon cancer Neg Hx   ? Esophageal cancer Neg Hx   ? Rectal cancer Neg Hx   ? ? ?Social History:  reports that he has never smoked. He has never used smokeless tobacco. He reports current alcohol use. He reports that he does not currently use drugs. ? ?Allergies:  ?Allergies  ?Allergen Reactions  ? Celebrex [Celecoxib] Itching  ? Penicillins Itching  ? Sulfonamide Derivatives Itching  ? ? ?Medications: I have reviewed the patient's current medications. ? ?Results for orders placed or performed during the hospital encounter of 10/20/21 (from the past 48 hour(s))  ?Glucose, capillary     Status: Abnormal  ? Collection Time: 10/20/21  7:51 AM  ?Result Value Ref Range  ? Glucose-Capillary 145 (H) 70 - 99 mg/dL  ?  Comment: Glucose reference range applies only to samples taken after fasting for at least 8 hours.  ? Comment 1 Notify RN   ? Comment 2 Document in Chart   ? ? ?No results found. ? ?ROS ?10 point review of systems is negative except as listed above in HPI.  ? ?Physical Exam ?Blood pressure 133/73, pulse 77, temperature 98.8 ?F (37.1 ?C), temperature source Oral, resp. rate 18, height '5\' 11"'$  (1.803 m), weight 116.1 kg, SpO2 97  %. ?Constitutional: well-developed, well-nourished ?HEENT: pupils equal, round, reactive to light, 67m b/l, moist conjunctiva, external inspection of ears and nose normal, hearing intact ?Oropharynx: normal oropharyngeal mucosa, normal dentition ?Neck: no thyromegaly, trachea midline, no midline cervical tenderness to palpation ?Chest: breath sounds equal bilaterally, normal respiratory effort, no midline or lateral chest wall tenderness to palpation/deformity ?Abdomen: soft, NT, no bruising, no hepatosplenomegaly ?GU: no blood at urethral meatus of penis, no scrotal masses or abnormality  ?Back: no wounds, no thoracic/lumbar spine tenderness to palpation, no thoracic/lumbar spine stepoffs ?Rectal: deferred ?Extremities: 2+ radial and pedal pulses bilaterally, intact motor and sensation bilateral UE and LE, no peripheral edema ?MSK: normal gait/station, no clubbing/cyanosis of fingers/toes, normal ROM of all four extremities ?Skin: warm, dry, no rashes ?Psych: normal memory, normal mood/affect  ? ?  ?Assessment/Plan: ?3M with [facility], plan for lap VHR with mesh. Informed consent was obtained after detailed explanation of risks, including bleeding, infection, hematoma/seroma, temporary or permanent neuropathy, hernia recurrence, and mesh infection requiring explantation. All questions answered to the patient's satisfaction. ? ? ?AJesusita Oka MD ?General and Trauma Surgery ?[facility] ? ? ? ? ? ? ?

## 2021-10-20 NOTE — Op Note (Addendum)
?  ?  Operative Note ?  ?  ?Date: 10/20/2021 ?  ?Procedure: laparoscopic ventral hernia repair with mesh ?  ?Pre-op diagnosis: incarcerated umbilical hernia ?Post-op diagnosis: incarcerated umbilical hernia, fat-containing, 2.5x6cm ?  ?Indication and clinical history: 57M with symptomatic incarcerated umbilical hernia   ?  ?Surgeon: Jesusita Oka, MD ?  ?Anesthesiologist: Marcie Bal, MD ?Anesthesia: General ?  ?Findings:  ?Specimen: none ?EBL: <5cc ?Drains/Implants: 10.2 x15.4cm ventralight permanent mesh ?  ?Disposition: PACU - hemodynamically stable. ?  ?Description of procedure: The patient was positioned supine on the operating room table. General anesthetic induction and intubation were uneventful. Time-out was performed verifying correct patient, procedure, signature of informed consent, and administration of pre-operative antibiotics. The abdomen was prepped and draped in the usual sterile fashion, including the use of an ioban drape.  ?  ?A Veress needle was used in the left upper quadrant to insufflate the abdomen and the abdomen entered using a 68m port and an optiview technique. No injury to any intra-abdominal structure was identified. Two additional 534mports were placed in the left abdomen under direct visualization and after administration of local anesthetic. The incarcerated fat was removed and the hernia defect closed using a figure of eight ethibond suture. A 10.2 x15.4cm  circular mesh was labeled in four quadrants, #1 novofil suture secured to it in four quadrants, and was inserted into the abdomen. After orientation in the abdomen, a suture passer was used to secure the mesh in four quadrants and a 68m4mbsorbable tacker used to circumferentially secure it. The skin of all port sites were closed with 4-0 monocryl suture. All wounds were dressed with dermabond as sterile dressing.  ?  ?All sponge and instrument counts were correct at the conclusion of the procedure. The patient was awakened from  anesthesia, extubated uneventfully, and transported to the PACU in good condition. There were no complications.  ?  ?  ?  ?AyeJesusita OkaD ?General and Trauma Surgery ?[facility] ? ?

## 2021-10-20 NOTE — Discharge Instructions (Addendum)
May shower beginning 10/21/2021. Do not peel off or scrub skin glue. May allow warm soapy water to run over incision, then rinse and pat dry. Do not soak in any water (tubs, hot tubs, pools, lakes, oceans) for one week.  ? ?No lifting greater than 5 pounds for six weeks. May resume sexual activity when it is comfortable.  ? ?Pain regimen: take over-the-counter tylenol (acetaminophen) '1000mg'$  every six hours, the prescription ibuprofen ('600mg'$ ) every six hours and the robaxin (methocarbamol) '750mg'$  every six hours. With all three of these, you should be taking something every two hours. Example: tylenol ( acetaminophen) at 8am, ibuprofen at 10am, robaxin (methocarbamol) at 12pm, tylenol (acetaminophen) again at 2pm, ibuprofen again at 4pm, robaxin (methocarbamol) at 6pm. You also have a prescription for oxycodone, which should be taken if the tylenol (acetaminophen), ibuprofen, and robaxin (methocarbamol) are not enough to control your pain. You may take the oxycodone as frequently as every four hours as needed, but if you are taking the other medications as above, you should not need the oxycodone this frequently. You have also been given a prescription for colace (docusate) which is a stool softener. Please take this as prescribed because the oxycodone can cause constipation and the colace (docusate) will minimize or prevent constipation. Do not drive while taking or under the influence of the oxycodone as it is a narcotic medication. ? ?Call the office at [phone] for temperature greater than 101.17F, worsening pain, redness or warmth at the incision site. ? ?Please call [phone] to make an appointment for 2-3 weeks after surgery for wound check.  ? ?

## 2021-10-20 NOTE — Anesthesia Procedure Notes (Addendum)
Procedure Name: Intubation ?Date/Time: 10/20/2021 9:55 AM ?Performed by: Lowella Dell, CRNA ?Pre-anesthesia Checklist: Patient identified, Emergency Drugs available, Suction available and Patient being monitored ?Patient Re-evaluated:Patient Re-evaluated prior to induction ?Oxygen Delivery Method: Circle System Utilized ?Preoxygenation: Pre-oxygenation with 100% oxygen ?Induction Type: IV induction ?Ventilation: Mask ventilation without difficulty and Oral airway inserted - appropriate to patient size ?Laryngoscope Size: Mac and 4 ?Grade View: Grade II ?Tube type: Oral ?Tube size: 7.5 mm ?Number of attempts: 1 ?Airway Equipment and Method: Stylet ?Placement Confirmation: ETT inserted through vocal cords under direct vision, positive ETCO2 and breath sounds checked- equal and bilateral ?Secured at: 23 cm ?Tube secured with: Tape ?Dental Injury: Teeth and Oropharynx as per pre-operative assessment  ?Comments: Airway by paramedic student Sundra Aland ? ? ? ? ?

## 2021-10-20 NOTE — Transfer of Care (Signed)
Immediate Anesthesia Transfer of Care Note ? ?Patient: Clifford Kramer ? ?Procedure(s) Performed: LAPAROSCOPIC VENTRAL HERNIA REPAIR (Abdomen) ?INSERTION OF MESH (Abdomen) ? ?Patient Location: PACU ? ?Anesthesia Type:General ? ?Level of Consciousness: awake and alert  ? ?Airway & Oxygen Therapy: Patient Spontanous Breathing and Patient connected to face mask oxygen ? ?Post-op Assessment: Report given to RN and Post -op Vital signs reviewed and stable ? ?Post vital signs: Reviewed and stable ? ?Last Vitals:  ?Vitals Value Taken Time  ?BP    ?Temp    ?Pulse 84 10/20/21 1107  ?Resp 19 10/20/21 1107  ?SpO2 100 % 10/20/21 1107  ?Vitals shown include unvalidated device data. ? ?Last Pain:  ?Vitals:  ? 10/20/21 0754  ?TempSrc:   ?PainSc: 0-No pain  ?   ? ?  ? ?Complications: No notable events documented. ?

## 2021-10-23 ENCOUNTER — Encounter (HOSPITAL_COMMUNITY): Payer: Self-pay | Admitting: Surgery

## 2021-10-23 NOTE — Anesthesia Postprocedure Evaluation (Signed)
Anesthesia Post Note ? ?Patient: YOUSSEF FOOTMAN ? ?Procedure(s) Performed: LAPAROSCOPIC VENTRAL HERNIA REPAIR (Abdomen) ?INSERTION OF MESH (Abdomen) ? ?  ? ?Patient location during evaluation: PACU ?Anesthesia Type: General ?Level of consciousness: awake and alert ?Pain management: pain level controlled ?Vital Signs Assessment: post-procedure vital signs reviewed and stable ?Respiratory status: spontaneous breathing, nonlabored ventilation, respiratory function stable and patient connected to nasal cannula oxygen ?Cardiovascular status: blood pressure returned to baseline and stable ?Postop Assessment: no apparent nausea or vomiting ?Anesthetic complications: no ? ? ?No notable events documented. ? ?Last Vitals:  ?Vitals:  ? 10/20/21 1140 10/20/21 1154  ?BP: 129/79 133/83  ?Pulse: 79 72  ?Resp: 16 15  ?Temp: 36.6 ?C   ?SpO2: 97% 98%  ?  ?Last Pain:  ?Vitals:  ? 10/20/21 1140  ?TempSrc:   ?PainSc: 3   ? ? ?  ?  ?  ?  ?  ?  ? ?[place] ? ? ? ? ?

## 2021-10-30 ENCOUNTER — Other Ambulatory Visit (INDEPENDENT_AMBULATORY_CARE_PROVIDER_SITE_OTHER): Payer: BC Managed Care – PPO

## 2021-10-30 DIAGNOSIS — E291 Testicular hypofunction: Secondary | ICD-10-CM

## 2021-10-30 DIAGNOSIS — E1165 Type 2 diabetes mellitus with hyperglycemia: Secondary | ICD-10-CM

## 2021-10-30 LAB — COMPREHENSIVE METABOLIC PANEL
ALT: 14 U/L (ref 0–53)
AST: 13 U/L (ref 0–37)
Albumin: 4.3 g/dL (ref 3.5–5.2)
Alkaline Phosphatase: 94 U/L (ref 39–117)
BUN: 17 mg/dL (ref 6–23)
CO2: 28 mEq/L (ref 19–32)
Calcium: 9.2 mg/dL (ref 8.4–10.5)
Chloride: 103 mEq/L (ref 96–112)
Creatinine, Ser: 1.21 mg/dL (ref 0.40–1.50)
GFR: 66.36 mL/min (ref >60.00)
Glucose, Bld: 133 mg/dL — ABNORMAL HIGH (ref 70–99)
Potassium: 4.2 mEq/L (ref 3.5–5.1)
Sodium: 140 mEq/L (ref 135–145)
Total Bilirubin: 0.4 mg/dL (ref 0.2–1.2)
Total Protein: 6.9 g/dL (ref 6.0–8.3)

## 2021-10-30 LAB — MICROALBUMIN / CREATININE URINE RATIO
Creatinine,U: 146.9 mg/dL
Microalb Creat Ratio: 0.5 mg/g (ref 0.0–30.0)
Microalb, Ur: <0.7 mg/dL (ref 0.0–1.9)

## 2021-10-30 LAB — HEMOGLOBIN A1C: Hgb A1c MFr Bld: 7.8 % — ABNORMAL HIGH (ref 4.6–6.5)

## 2021-10-30 LAB — TESTOSTERONE: Testosterone: 332.23 ng/dL (ref 300.00–890.00)

## 2021-11-01 ENCOUNTER — Other Ambulatory Visit: Payer: Self-pay | Admitting: Endocrinology

## 2021-11-06 ENCOUNTER — Ambulatory Visit (INDEPENDENT_AMBULATORY_CARE_PROVIDER_SITE_OTHER): Payer: BC Managed Care – PPO | Admitting: Endocrinology

## 2021-11-06 ENCOUNTER — Encounter: Payer: Self-pay | Admitting: Endocrinology

## 2021-11-06 VITALS — BP 120/76 | Ht 71.0 in | Wt 266.0 lb

## 2021-11-06 DIAGNOSIS — E291 Testicular hypofunction: Secondary | ICD-10-CM

## 2021-11-06 DIAGNOSIS — I10 Essential (primary) hypertension: Secondary | ICD-10-CM | POA: Diagnosis not present

## 2021-11-06 DIAGNOSIS — E1165 Type 2 diabetes mellitus with hyperglycemia: Secondary | ICD-10-CM | POA: Diagnosis not present

## 2021-11-06 MED ORDER — OZEMPIC (2 MG/DOSE) 8 MG/3ML ~~LOC~~ SOPN: PEN_INJECTOR | SUBCUTANEOUS | 3 refills | Status: DC

## 2021-11-06 NOTE — Patient Instructions (Addendum)
Take '2mg'$  Ozempic weekly ? ?Check blood sugars on waking up 3 days a week ? ?Also check blood sugars about 2 hours after meals and do this after different meals by rotation ? ?Recommended blood sugar levels on waking up are 90-130 and about 2 hours after meal is 130-160 ? ?Please bring your blood sugar monitor to each visit, thank you ? ? ?

## 2021-11-06 NOTE — Progress Notes (Signed)
? ? ?Patient ID: Clifford Kramer, male   DOB: 1965/08/10, 56 y.o.   MRN: [id number] ? ? ? ?Reason for Appointment : Endocrinology follow-up ? ?History of Present Illness  ?        ?Diagnosis: Type 2 diabetes mellitus, date of diagnosis: 2005    ? ?Background information: He was probably started on metformin at the time of diagnosis when his blood sugars were not very high ?About 5-6 years ago because of higher blood sugars he was also given glipizide ? He appeared to have  progression of his diabetes with A1c going up to 8.1% in 01/2013; also had difficulty with losing weight before he was started on Victoza.  With this his blood sugars were significantly better ?His glipizide was reduced to 2.5 mg in 10/14 because of rare hypoglycemia  ? ? RECENT history:  ? ?Antihyperglycemic drugs : Ozempic 1 mg weekly, and Amaryl 1 mg acs, Synjardy XR 12.11/998, 2 tablets daily at 8 am ? ?Work routine: He starts work at [employer] AM for [place] ? ?Current management, blood sugar patterns and problems: ?His A1c is 7.8 although in March from the hospital it was 6.9  ? ?He has not been very active and because of his hernia surgery has not been able to exercise for about 6 weeks  ?Also has not been checking his blood sugars very much especially recently  ?Seems to have had more high readings right after surgery with postprandial readings occasionally over 200  ?Lab fasting glucose has been around 130 ?Only this medication he has started doing a little walking but not as much as usual ?Also with not working and may be not watching his diet consistently and has gained 4 pounds at least since the last visit ?Has been regular with his Ozempic and only once had a delay in getting this refilled ? ? ? Side effects from medications have been: None      ?  ?Monitors blood glucose: once a day or twice          Glucometer:      Accu-Chek ? ?Blood Glucose readings from meal show ? ? ?PRE-MEAL Fasting Lunch Dinner Bedtime Overall  ?Glucose range:  116-156 76-150 131, 210    ?Mean/median:     162  ? ?POST-MEAL PC Breakfast PC Lunch PC Dinner  ?Glucose range:   161, 270  ?Mean/median:     ? ?Previously: ? ? ?PRE-MEAL Fasting Lunch Dinner Bedtime Overall  ?Glucose range: 115, 117  128    ?Mean/median:     129  ? ?POST-MEAL PC Breakfast PC Lunch PC Dinner  ?Glucose range: 144-156    ?Mean/median:     ? ? ?  ?LIFESTYLE: Meals: 3 meals per day. Dinner 5 pm, Bfst 9 am; lunch 2 pm;  eating out periodically ? ?His breakfast:  eggs and toast or meat, sometimes fast food sandwiches ?Usually eating fruit and sandwich at lunch, usually a grilled meat at suppertime ?  ?Dietician visit: 05/2013       ?   ? ?Wt Readings from Last 3 Encounters:  ?11/06/21 266 lb (120.7 kg)  ?10/20/21 256 lb (116.1 kg)  ?10/16/21 268 lb 1.6 oz (121.6 kg)  ? ?Lab Results  ?Component Value Date  ? HGBA1C 7.8 (H) 10/30/2021  ? HGBA1C 6.9 (H) 10/16/2021  ? HGBA1C 7.0 (H) 06/26/2021  ? ?Lab Results  ?Component Value Date  ? MICROALBUR <0.7 10/30/2021  ? Secor 52 05/29/2021  ? CREATININE 1.21 10/30/2021  ? ? ?  Other active problems including new problems are in review of systems ? ?  ?No visits with results within 1 Week(s) from this visit.  ?Latest known visit with results is:  ?Lab on 10/30/2021  ?Component Date Value Ref Range Status  ? Testosterone 10/30/2021 332.23  300.00 - 890.00 ng/dL Final  ? Microalb, Ur 10/30/2021 <0.7  0.0 - 1.9 mg/dL Final  ? Creatinine,U 10/30/2021 146.9  mg/dL Final  ? Microalb Creat Ratio 10/30/2021 0.5  0.0 - 30.0 mg/g Final  ? Sodium 10/30/2021 140  135 - 145 mEq/L Final  ? Potassium 10/30/2021 4.2  3.5 - 5.1 mEq/L Final  ? Chloride 10/30/2021 103  96 - 112 mEq/L Final  ? CO2 10/30/2021 28  19 - 32 mEq/L Final  ? Glucose, Bld 10/30/2021 133 (H)  70 - 99 mg/dL Final  ? BUN 10/30/2021 17  6 - 23 mg/dL Final  ? Creatinine, Ser 10/30/2021 1.21  0.40 - 1.50 mg/dL Final  ? Total Bilirubin 10/30/2021 0.4  0.2 - 1.2 mg/dL Final  ? Alkaline Phosphatase 10/30/2021 94  39 -  117 U/L Final  ? AST 10/30/2021 13  0 - 37 U/L Final  ? ALT 10/30/2021 14  0 - 53 U/L Final  ? Total Protein 10/30/2021 6.9  6.0 - 8.3 g/dL Final  ? Albumin 10/30/2021 4.3  3.5 - 5.2 g/dL Final  ? GFR 10/30/2021 66.36  >60.00 mL/min Final  ? Calculated using the CKD-EPI Creatinine Equation (2021)  ? Calcium 10/30/2021 9.2  8.4 - 10.5 mg/dL Final  ? Hgb A1c MFr Bld 10/30/2021 7.8 (H)  4.6 - 6.5 % Final  ? Glycemic Control Guidelines for People with Diabetes:Non Diabetic:  <6%Goal of Therapy: <7%Additional Action Suggested:  >8%   ? ? ?Allergies as of 11/06/2021   ? ?   Reactions  ? Celebrex [celecoxib] Itching  ? Penicillins Itching  ? Sulfonamide Derivatives Itching  ? ?  ? ?  ?Medication List  ?  ? ?  ? Accurate as of November 06, 2021  1:21 PM. If you have any questions, ask your nurse or doctor.  ?  ?  ? ?  ? ?STOP taking these medications   ? ?docusate sodium 100 MG capsule ?Commonly known as: Colace ?Stopped by: Elayne Snare, MD ?  ?ibuprofen 200 MG tablet ?Commonly known as: ADVIL ?Stopped by: Elayne Snare, MD ?  ?ibuprofen 600 MG tablet ?Commonly known as: ADVIL ?Stopped by: Elayne Snare, MD ?  ?methocarbamol 750 MG tablet ?Commonly known as: Robaxin-750 ?Stopped by: Elayne Snare, MD ?  ?oxyCODONE 5 MG immediate release tablet ?Commonly known as: Roxicodone ?Stopped by: Elayne Snare, MD ?  ? ?  ? ?TAKE these medications   ? ?Accu-Chek FastClix Lancets Misc ?Use to check blood sugar 3 times daily. Dx Code E11.9 ?  ?Accu-Chek Guide test strip ?Generic drug: glucose blood ?Use Accu chek guide test strips as instructed to check blood sugar three times daily. DX:E11.9 ?  ?Accu-Chek Guide w/Device Kit ?1 each by Does not apply route 3 (three) times daily. Use accu chek guide device to check blood sugar twice daily. DX:E11.9 ?  ?acetaminophen 500 MG tablet ?Commonly known as: TYLENOL ?Take 2 tablets (1,000 mg total) by mouth every 6 (six) hours as needed. ?  ?allopurinol 300 MG tablet ?Commonly known as: ZYLOPRIM ?TAKE 1 TABLET  BY MOUTH EVERY DAY ?  ?amLODipine 5 MG tablet ?Commonly known as: NORVASC ?TAKE 1 TABLET BY MOUTH EVERY DAY ?  ?aspirin 81  MG tablet ?Take 81 mg by mouth daily. ?  ?BD Pen Needle Nano U/F 32G X 4 MM Misc ?Generic drug: Insulin Pen Needle ?USE EVERY DAY ?  ?Enbrel SureClick 50 MG/ML injection ?Generic drug: etanercept ?Inject 50 mg into the skin every Monday. ?  ?fluticasone 50 MCG/ACT nasal spray ?Commonly known as: FLONASE ?PLACE 2 SPRAYS INTO THE NOSE DAILY. ?What changed: See the new instructions. ?  ?glimepiride 1 MG tablet ?Commonly known as: AMARYL ?TAKE 1 TABLET BY MOUTH EVERY DAY WITH BREAKFAST ?  ?lisinopril-hydrochlorothiazide 20-12.5 MG tablet ?Commonly known as: ZESTORETIC ?TAKE 1 TABLET BY MOUTH EVERY DAY ?  ?Natesto 5.5 MG/ACT Gel ?Generic drug: Testosterone ?PLACE 1 SPRAY INTO EACH NOSTRIL 3 TIMES A DAY ?What changed: See the new instructions. ?  ?omeprazole 40 MG capsule ?Commonly known as: PRILOSEC ?TAKE 1 CAPSULE BY MOUTH EVERY DAY ?What changed: how much to take ?  ?Ozempic (1 MG/DOSE) 4 MG/3ML Sopn ?Generic drug: Semaglutide (1 MG/DOSE) ?INJECT 1 MG INTO THE SKIN ONCE A WEEK. ?What changed: when to take this ?  ?simvastatin 20 MG tablet ?Commonly known as: ZOCOR ?TAKE 1 TABLET BY MOUTH EVERYDAY AT BEDTIME ?What changed: See the new instructions. ?  ?Synjardy XR 12.11-998 MG Tb24 ?Generic drug: Empagliflozin-metFORMIN HCl ER ?TAKE 2 TABLETS BY MOUTH DAILY WITH BREAKFAST. ?What changed: when to take this ?  ?Viagra 100 MG tablet ?Generic drug: sildenafil ?Take 100 mg by mouth daily as needed for erectile dysfunction. ?  ? ?  ? ? ?Allergies:  ?Allergies  ?Allergen Reactions  ? Celebrex [Celecoxib] Itching  ? Penicillins Itching  ? Sulfonamide Derivatives Itching  ? ? ?Past Medical History:  ?Diagnosis Date  ? COVID-19 11/2020  ? Diabetes mellitus   ? 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 Gout   ? Hyperlipidemia   ? Hypertension   ? RA (rheumatoid arthritis) ([place])   ? Sleep apnea   ? CPAP use    ? ? ?Past Surgical History:  ?Procedure Laterality Date  ? COLONOSCOPY  2015  ? INSERTION OF MESH N/A 10/20/2021  ? Procedure: INSERTION OF MESH;  Surgeon: Jesusita Oka, MD;  Location: [place];  Service: Delfino Lovett

## 2021-11-09 ENCOUNTER — Encounter: Payer: Self-pay | Admitting: Endocrinology

## 2021-12-17 ENCOUNTER — Other Ambulatory Visit: Payer: Self-pay | Admitting: Family Medicine

## 2022-01-15 ENCOUNTER — Other Ambulatory Visit: Payer: Self-pay | Admitting: Endocrinology

## 2022-01-17 ENCOUNTER — Other Ambulatory Visit: Payer: Self-pay | Admitting: Endocrinology

## 2022-02-09 ENCOUNTER — Other Ambulatory Visit (INDEPENDENT_AMBULATORY_CARE_PROVIDER_SITE_OTHER): Payer: BC Managed Care – PPO

## 2022-02-09 DIAGNOSIS — E1165 Type 2 diabetes mellitus with hyperglycemia: Secondary | ICD-10-CM

## 2022-02-09 DIAGNOSIS — E291 Testicular hypofunction: Secondary | ICD-10-CM

## 2022-02-09 NOTE — Addendum Note (Signed)
Addended by: Kaylyn Lim I on: 02/09/2022 03:47 PM   Modules accepted: Orders

## 2022-02-10 LAB — BASIC METABOLIC PANEL
BUN/Creatinine Ratio: 11 (ref 9–20)
BUN: 14 mg/dL (ref 6–24)
CO2: 20 mmol/L (ref 20–29)
Calcium: 8.9 mg/dL (ref 8.7–10.2)
Chloride: 100 mmol/L (ref 96–106)
Creatinine, Ser: 1.28 mg/dL — ABNORMAL HIGH (ref 0.76–1.27)
Glucose: 80 mg/dL (ref 70–99)
Potassium: 4.2 mmol/L (ref 3.5–5.2)
Sodium: 137 mmol/L (ref 134–144)
eGFR: 65 mL/min/{1.73_m2} (ref >59)

## 2022-02-10 LAB — CBC
Hematocrit: 42.2 % (ref 37.5–51.0)
Hemoglobin: 13.9 g/dL (ref 13.0–17.7)
MCH: 25.6 pg — ABNORMAL LOW (ref 26.6–33.0)
MCHC: 32.9 g/dL (ref 31.5–35.7)
MCV: 78 fL — ABNORMAL LOW (ref 79–97)
Platelets: 271 10*3/uL (ref 150–450)
RBC: 5.42 x10E6/uL (ref 4.14–5.80)
RDW: 16.9 % — ABNORMAL HIGH (ref 11.6–15.4)
WBC: 6.4 10*3/uL (ref 3.4–10.8)

## 2022-02-10 LAB — HEMOGLOBIN A1C
Est. average glucose Bld gHb Est-mCnc: 151 mg/dL
Hgb A1c MFr Bld: 6.9 % — ABNORMAL HIGH (ref 4.8–5.6)

## 2022-02-10 LAB — TESTOSTERONE: Testosterone: 247 ng/dL — ABNORMAL LOW (ref 264–916)

## 2022-02-11 NOTE — Progress Notes (Unsigned)
Patient ID: Clifford Kramer, male   DOB: 08-Dec-1965, 56 y.o.   MRN: [id number]    Reason for Appointment : Endocrinology follow-up  History of Present Illness          Diagnosis: Type 2 diabetes mellitus, date of diagnosis: 2005     Background information: He was probably started on metformin at the time of diagnosis when his blood sugars were not very high About 5-6 years ago because of higher blood sugars he was also given glipizide  He appeared to have  progression of his diabetes with A1c going up to 8.1% in 01/2013; also had difficulty with losing weight before he was started on Victoza.  With this his blood sugars were significantly better His glipizide was reduced to 2.5 mg in 10/14 because of rare hypoglycemia    RECENT history:   Antihyperglycemic drugs : Ozempic 2 mg weekly, and Amaryl 1 mg a.m., Synjardy XR 12.11/998, 2 tablets daily at 8 am  Work routine: He starts work at [employer] AM for [employer]  Current management, blood sugar patterns and problems: His A1c is 6.9 vs 7.8 and back to baseline  He apparently had been off some of his diabetes medicine for 2 weeks because of his surgery and not clear why his medicines were held Also he had been relatively inactive after the surgery  He has started walking in the evening fairly regularly about 3 miles  Although he has not taken 2 mg of the Ozempic he has not lost any significant amount of weight  He says he does not get hungry between meals  Also forgetting to check his blood sugars and has done only a few random blood sugars around breakfast time which is 3 to 4 hours after he wakes up  However lab glucose was 80 and he was late for lunch that day  No recent nausea with Ozempic Has been regular with other medications However for some reason he is taking his Amaryl in the morning instead of evening on his own, sometimes before lunch may feel a little shaky   Side effects from medications have been: None        Monitors  blood glucose: once a day or twice          Glucometer:      Accu-Chek  Blood Glucose readings from monitor review  109-126 Recent 30-day average 119  PREVIOUSLY  PRE-MEAL Fasting Lunch Dinner Bedtime Overall  Glucose range: 116-156 76-150 131, 210    Mean/median:     162   POST-MEAL PC Breakfast PC Lunch PC Dinner  Glucose range:   161, 270  Mean/median:        LIFESTYLE: Meals: 3 meals per day. Dinner 5 pm, Bfst 9 am; lunch 2 pm;  eating out periodically  His breakfast:  eggs and toast or meat, sometimes fast food sandwiches Usually eating fruit and sandwich at lunch, usually a grilled meat at suppertime   Dietician visit: 05/2013           Wt Readings from Last 3 Encounters:  02/12/22 263 lb 3.2 oz (119.4 kg)  11/06/21 266 lb (120.7 kg)  10/20/21 256 lb (116.1 kg)   Lab Results  Component Value Date   HGBA1C 6.9 (H) 02/09/2022   HGBA1C 7.8 (H) 10/30/2021   HGBA1C 6.9 (H) 10/16/2021   Lab Results  Component Value Date   MICROALBUR <0.7 10/30/2021   LDLCALC 52 05/29/2021   CREATININE 1.28 (H) 02/09/2022  Other active problems including new problems are in review of systems    Lab on 02/09/2022  Component Date Value Ref Range Status   Hgb A1c MFr Bld 02/09/2022 6.9 (H)  4.8 - 5.6 % Final   Comment:          Prediabetes: 5.7 - 6.4          Diabetes: >6.4          Glycemic control for adults with diabetes: <7.0    Est. average glucose Bld gHb Est-m* 02/09/2022 151  mg/dL Final   Glucose 02/09/2022 80  70 - 99 mg/dL Final   BUN 02/09/2022 14  6 - 24 mg/dL Final   Creatinine, Ser 02/09/2022 1.28 (H)  0.76 - 1.27 mg/dL Final   eGFR 02/09/2022 65  >59 mL/min/1.73 Final   BUN/Creatinine Ratio 02/09/2022 11  9 - 20 Final   Sodium 02/09/2022 137  134 - 144 mmol/L Final   Potassium 02/09/2022 4.2  3.5 - 5.2 mmol/L Final   Chloride 02/09/2022 100  96 - 106 mmol/L Final   CO2 02/09/2022 20  20 - 29 mmol/L Final   Calcium 02/09/2022 8.9  8.7 - 10.2 mg/dL Final    Testosterone 02/09/2022 247 (L)  264 - 916 ng/dL Final   Comment: Adult male reference interval is based on a population of healthy nonobese males (BMI <30) between 4 and 46 years old. [place], [place] [id number]. PMID: [id number].    WBC 02/09/2022 6.4  3.4 - 10.8 x10E3/uL Final   RBC 02/09/2022 5.42  4.14 - 5.80 x10E6/uL Final   Hemoglobin 02/09/2022 13.9  13.0 - 17.7 g/dL Final   Hematocrit 02/09/2022 42.2  37.5 - 51.0 % Final   MCV 02/09/2022 78 (L)  79 - 97 fL Final   MCH 02/09/2022 25.6 (L)  26.6 - 33.0 pg Final   MCHC 02/09/2022 32.9  31.5 - 35.7 g/dL Final   RDW 02/09/2022 16.9 (H)  11.6 - 15.4 % Final   Platelets 02/09/2022 271  150 - 450 x10E3/uL Final    Allergies as of 02/12/2022       Reactions   Celebrex [celecoxib] Itching   Penicillins Itching   Sulfonamide Derivatives Itching        Medication List        Accurate as of February 12, 2022  1:10 PM. If you have any questions, ask your nurse or doctor.          Accu-Chek FastClix Lancets Misc Use to check blood sugar 3 times daily. Dx Code E11.9   Accu-Chek Guide test strip Generic drug: glucose blood Use Accu chek guide test strips as instructed to check blood sugar three times daily. DX:E11.9   Accu-Chek Guide w/Device Kit 1 each by Does not apply route 3 (three) times daily. Use accu chek guide device to check blood sugar twice daily. DX:E11.9   acetaminophen 500 MG tablet Commonly known as: TYLENOL Take 2 tablets (1,000 mg total) by mouth every 6 (six) hours as needed.   allopurinol 300 MG tablet Commonly known as: ZYLOPRIM TAKE 1 TABLET BY MOUTH EVERY DAY   amLODipine 5 MG tablet Commonly known as: NORVASC TAKE 1 TABLET BY MOUTH EVERY DAY   aspirin 81 MG tablet Take 81 mg by mouth daily.   BD Pen Needle Nano U/F 32G X 4 MM Misc Generic drug: Insulin Pen Needle USE EVERY DAY   Enbrel SureClick 50 MG/ML injection Generic drug: etanercept Inject 50 mg into the skin  every  Monday.   fluticasone 50 MCG/ACT nasal spray Commonly known as: FLONASE PLACE 2 SPRAYS INTO THE NOSE DAILY. What changed: See the new instructions.   glimepiride 1 MG tablet Commonly known as: AMARYL TAKE 1 TABLET BY MOUTH EVERY DAY WITH BREAKFAST   lisinopril-hydrochlorothiazide 20-12.5 MG tablet Commonly known as: ZESTORETIC TAKE 1 TABLET BY MOUTH EVERY DAY   Natesto 5.5 MG/ACT Gel Generic drug: Testosterone PLACE 1 SPRAY INTO EACH NOSTRIL 3 TIMES A DAY   omeprazole 40 MG capsule Commonly known as: PRILOSEC TAKE 1 CAPSULE BY MOUTH EVERY DAY   Ozempic (2 MG/DOSE) 8 MG/3ML Sopn Generic drug: Semaglutide (2 MG/DOSE) Inject 2 mg weekly for diabetes   simvastatin 20 MG tablet Commonly known as: ZOCOR TAKE 1 TABLET BY MOUTH EVERYDAY AT BEDTIME What changed: See the new instructions.   Synjardy XR 12.11-998 MG Tb24 Generic drug: Empagliflozin-metFORMIN HCl ER TAKE 2 TABLETS BY MOUTH DAILY WITH BREAKFAST.   Viagra 100 MG tablet Generic drug: sildenafil Take 100 mg by mouth daily as needed for erectile dysfunction.        Allergies:  Allergies  Allergen Reactions   Celebrex [Celecoxib] Itching   Penicillins Itching   Sulfonamide Derivatives Itching    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   Gout    Hyperlipidemia    Hypertension    RA (rheumatoid arthritis) ([place])    Sleep apnea    CPAP use     Past Surgical History:  Procedure Laterality Date   COLONOSCOPY  2015   INSERTION OF MESH N/A 10/20/2021   Procedure: INSERTION OF MESH;  Surgeon: Jesusita Oka, MD;  Location: [place];  Service: General;  Laterality: N/A;   KNEE SURGERY Left 2005   VENTRAL HERNIA REPAIR N/A 10/20/2021   Procedure: LAPAROSCOPIC VENTRAL HERNIA REPAIR;  Surgeon: Jesusita Oka, MD;  Location: [place];  Service: General;  Laterality: N/A;    Family History  Problem Relation Age of Onset   Diabetes Mother     Stomach cancer Other    Diabetes Maternal Aunt    Diabetes Maternal Grandmother    Heart disease Neg Hx    Colon cancer Neg Hx    Esophageal cancer Neg Hx    Rectal cancer Neg Hx     Social History:  reports that he has never smoked. He has never used smokeless tobacco. He reports current alcohol use. He reports that he does not currently use drugs.    Review of Systems    HYPERTENSION: This is well controlled with Norvasc 2.5 mg and  20 mg lisinopril HCTZ  BP Readings from Last 3 Encounters:  02/12/22 124/76  11/06/21 120/76  10/20/21 133/83   Creatinine relatively stable     Lab Results  Component Value Date   CREATININE 1.28 (H) 02/09/2022   CREATININE 1.21 10/30/2021   CREATININE 1.24 10/16/2021     GOUT: He has had history of recurrent episodes of probable gout in various joints including knee and elbow in the past Baseline uric acid was 8.4 done in 2012  Taking allopurinol 300 mg daily regularly with last uric acid 3.3  He is taking Enbrel for his rheumatoid arthritis from rheumatologist     Hyperlipidemia: LDL has been controlled, baseline previously 109, has been prescribed simvastatin 20 mg by his PCP. Has a low HDL  Lab Results  Component Value Date   CHOL  103 05/29/2021   HDL 31.30 (L) 05/29/2021   LDLCALC 52 05/29/2021   TRIG 94.0 05/29/2021   CHOLHDL 3 05/29/2021       HYPOGONADISM:  He has been on testosterone supplements since 2011 when his level was mildly low around 300  but no further evaluation done.   He has a relatively low testosterone level while using Axiron and had difficulty with the application With Androderm patches he had improved testosterone levels and less fatigue  His testosterone level had been inconsistent with using 8 mg Androderm and was taking the 6 mg total dose  He was getting more skin irritation and sensitivity to the Androderm and this was changed to NATESTO  He has been regular with taking this twice a day  as recommended, 1 dose in each nostril at about 7 AM and 7 PM He is however having difficulties with alteration of smell, having a bad taste in his mouth sometimes after taking this   Although he is not feeling more lethargic or less alert his testosterone level is low now, checked about 8 hours after his taking his morning dose  Testosterone level previously consistently just over 300   Lab Results  Component Value Date   TESTOSTERONE 247 (L) 02/09/2022   Lab Results  Component Value Date   HGB 13.9 02/09/2022     He takes Viagra/ Cialis for erectile dysfunction, also was given Cialis by the [facility] with only intermittent benefit  EYE exams: These are done in January at the [facility] and no reports available   LABS:  Lab on 02/09/2022  Component Date Value Ref Range Status   Hgb A1c MFr Bld 02/09/2022 6.9 (H)  4.8 - 5.6 % Final   Comment:          Prediabetes: 5.7 - 6.4          Diabetes: >6.4          Glycemic control for adults with diabetes: <7.0    Est. average glucose Bld gHb Est-m* 02/09/2022 151  mg/dL Final   Glucose 02/09/2022 80  70 - 99 mg/dL Final   BUN 02/09/2022 14  6 - 24 mg/dL Final   Creatinine, Ser 02/09/2022 1.28 (H)  0.76 - 1.27 mg/dL Final   eGFR 02/09/2022 65  >59 mL/min/1.73 Final   BUN/Creatinine Ratio 02/09/2022 11  9 - 20 Final   Sodium 02/09/2022 137  134 - 144 mmol/L Final   Potassium 02/09/2022 4.2  3.5 - 5.2 mmol/L Final   Chloride 02/09/2022 100  96 - 106 mmol/L Final   CO2 02/09/2022 20  20 - 29 mmol/L Final   Calcium 02/09/2022 8.9  8.7 - 10.2 mg/dL Final   Testosterone 02/09/2022 247 (L)  264 - 916 ng/dL Final   Comment: Adult male reference interval is based on a population of healthy nonobese males (BMI <30) between 38 and 43 years old. [place], [place] [phone]. PMID: [id number].    WBC 02/09/2022 6.4  3.4 - 10.8 x10E3/uL Final   RBC 02/09/2022 5.42  4.14 - 5.80 x10E6/uL Final   Hemoglobin 02/09/2022  13.9  13.0 - 17.7 g/dL Final   Hematocrit 02/09/2022 42.2  37.5 - 51.0 % Final   MCV 02/09/2022 78 (L)  79 - 97 fL Final   MCH 02/09/2022 25.6 (L)  26.6 - 33.0 pg Final   MCHC 02/09/2022 32.9  31.5 - 35.7 g/dL Final   RDW 02/09/2022 16.9 (H)  11.6 - 15.4 % Final  Platelets 02/09/2022 271  150 - 450 x10E3/uL Final     Physical Examination:   BP 124/76 (BP Location: Left Arm, Patient Position: Sitting, Cuff Size: Normal)   Pulse 76   Ht '5\' 11"'  (1.803 m)   Wt 263 lb 3.2 oz (119.4 kg)   SpO2 96%   BMI 36.71 kg/m         ASSESSMENT/PLAN:   Diabetes type 2, with obesity  See history of present illness for detailed discussion of his current management, blood sugar patterns and problems identified  His A1c is back to 6.9   He is taking Synjardy, Ozempic 29m daily and Amaryl 1 mg daily  Blood sugars are better and may be benefiting from 2 mg Ozempic  He does need to check blood sugars more frequently and he is alternating fasting when he wakes up or 2 hours after meals and discussed targets He needs a new meter and we will switch him to One Touch Verio He thinks he can try and increase his exercise level also and go back to the gym  HYPERTENSION:  His blood pressure is well controlled and will continue the same dose and new prescription for 2.5 amlodipine given  Current renal function: Creatinine borderline, will watch  Last foot exam done by PCP  Hypogonadism:   He is on Natesto twice a day  He has somewhat difficult time tolerating this because of alteration of smell, bad taste with postnasal drip  His testosterone level is relatively low and may benefit from a trial of JATENZO but likely will need prior authorization for it Discussed how this would be taken and given patient information brochure on this  He will come back for lab work for testosterone about 6 hours after his Jatenzo dose, for now we will start with 158 mg twice daily  Follow-up in 4  months    There are no Patient Instructions on file for this visit.     AElayne Snare7/17/2023, 1:10 PM

## 2022-02-12 ENCOUNTER — Ambulatory Visit (INDEPENDENT_AMBULATORY_CARE_PROVIDER_SITE_OTHER): Payer: BC Managed Care – PPO | Admitting: Endocrinology

## 2022-02-12 ENCOUNTER — Encounter: Payer: Self-pay | Admitting: Endocrinology

## 2022-02-12 VITALS — BP 124/76 | HR 76 | Ht 71.0 in | Wt 263.2 lb

## 2022-02-12 DIAGNOSIS — E291 Testicular hypofunction: Secondary | ICD-10-CM | POA: Diagnosis not present

## 2022-02-12 DIAGNOSIS — I10 Essential (primary) hypertension: Secondary | ICD-10-CM

## 2022-02-12 DIAGNOSIS — E1165 Type 2 diabetes mellitus with hyperglycemia: Secondary | ICD-10-CM

## 2022-02-12 MED ORDER — JATENZO 158 MG PO CAPS: 1.0000 | ORAL_CAPSULE | Freq: Two times a day (BID) | ORAL | 2 refills | Status: DC

## 2022-02-12 MED ORDER — GLUCOSE BLOOD VI STRP: ORAL_STRIP | 12 refills | Status: DC

## 2022-02-12 MED ORDER — AMLODIPINE BESYLATE 2.5 MG PO TABS: 2.5000 mg | ORAL_TABLET | Freq: Every day | ORAL | 2 refills | Status: DC

## 2022-02-12 NOTE — Patient Instructions (Addendum)
Check blood sugars on waking up 2 days a week  Also check blood sugars about 2 hours after meals and do this after different meals by rotation  Recommended blood sugar levels on waking up are 90-130 and about 2 hours after meal is 130-160  Please bring your blood sugar monitor to each visit, thank you  Glimeperide at dinner  Check T level  6 hours after Jatenzo dose

## 2022-02-22 ENCOUNTER — Encounter: Payer: Self-pay | Admitting: Endocrinology

## 2022-02-22 DIAGNOSIS — E1165 Type 2 diabetes mellitus with hyperglycemia: Secondary | ICD-10-CM

## 2022-02-23 MED ORDER — GLUCOSE BLOOD VI STRP: ORAL_STRIP | 2 refills | Status: DC

## 2022-02-26 ENCOUNTER — Emergency Department
Admission: EM | Admit: 2022-02-26 | Discharge: 2022-02-26 | Disposition: A | Discharge status: Home / Self Care | Payer: BC Managed Care – PPO | Attending: Family Medicine | Admitting: Family Medicine

## 2022-02-26 ENCOUNTER — Encounter: Payer: Self-pay | Admitting: Emergency Medicine

## 2022-02-26 DIAGNOSIS — U071 COVID-19: Secondary | ICD-10-CM | POA: Diagnosis not present

## 2022-02-26 LAB — POC SARS CORONAVIRUS 2 AG -  ED: SARS Coronavirus 2 Ag: POSITIVE — AB

## 2022-02-26 MED ORDER — ACETAMINOPHEN 325 MG PO TABS
650.0000 mg | ORAL_TABLET | Freq: Once | ORAL | Status: AC
  Administered 2022-02-26: 650 mg via ORAL

## 2022-02-26 MED ORDER — PAXLOVID (300/100) 20 X 150 MG & 10 X 100MG PO TBPK: ORAL_TABLET | ORAL | 0 refills | Status: DC

## 2022-02-26 NOTE — ED Provider Notes (Signed)
[name]    CSN: [id number] Arrival date & time: 02/26/22  1736      History   Chief Complaint Chief Complaint  Patient presents with   Cough    HPI Clifford Kramer is a 56 y.o. male.   HPI  Patient states he started feeling bad yesterday.  Thought it was a summer cold.  Sore throat cough and congestion.  Today has body aches and fatigue.  He did a home COVID test.  It was positive.  He states he needs is to repeat the test for his work.  No one else at home is sick.  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   Gout    Hyperlipidemia    Hypertension    RA (rheumatoid arthritis) ([place])    Sleep apnea    CPAP use     Patient Active Problem List   Diagnosis Date Noted   Obesity 11/21/2020   Posttraumatic stress disorder 11/21/2020   Snoring 11/21/2020   Routine general medical examination at a health care facility 10/01/2017   Male hypogonadism 01/16/2016   Type II diabetes mellitus, uncontrolled 03/04/2013   Dyslipidemia (high LDL; low HDL) 03/04/2013   Alpha trait thalassemia 07/30/2012   Hyperlipidemia 07/30/2012   Gout 08/17/2010   Obstructive sleep apnea 12/07/2009   Hypersomnia with sleep 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   ERECTILE DYSFUNCTION 03/07/2007   Essential hypertension 03/07/2007   ALLERGIC RHINITIS 03/07/2007   Type 2 or unspecified type diabetes mellitus, uncontrolled 07/30/2006    Past Surgical History:  Procedure Laterality Date   COLONOSCOPY  2015   INSERTION OF MESH N/A 10/20/2021   Procedure: INSERTION OF MESH;  Surgeon: Jesusita Oka, MD;  Location: [facility];  Service: General;  Laterality: N/A;   KNEE SURGERY Left 2005   VENTRAL HERNIA REPAIR N/A 10/20/2021   Procedure: LAPAROSCOPIC VENTRAL HERNIA REPAIR;  Surgeon: Jesusita Oka, MD;  Location: [facility];  Service: General;  Laterality: N/A;       Home Medications    Prior to Admission medications    Medication Sig Start Date End Date Taking? Authorizing Provider  Accu-Chek FastClix Lancets MISC Use to check blood sugar 3 times daily. Dx Code E11.9 07/14/19  Yes Elayne Snare, MD  allopurinol (ZYLOPRIM) 300 MG tablet TAKE 1 TABLET BY MOUTH EVERY DAY Patient taking differently: Take 300 mg by mouth daily. 07/13/21  Yes [name], MD  amLODipine (NORVASC) 2.5 MG tablet Take 1 tablet (2.5 mg total) by mouth daily. 02/12/22  Yes Elayne Snare, MD  aspirin 81 MG tablet Take 81 mg by mouth daily.   Yes [provider]  BD PEN NEEDLE NANO U/F 32G X 4 MM MISC USE EVERY DAY 07/26/17  Yes Elayne Snare, MD  Blood Glucose Monitoring Suppl (ACCU-CHEK GUIDE) w/Device KIT 1 each by Does not apply route 3 (three) times daily. Use accu chek guide device to check blood sugar twice daily. DX:E11.9 07/14/19  Yes Elayne Snare, MD  ENBREL SURECLICK 50 MG/ML injection Inject 50 mg into the skin every Monday. 06/28/20  Yes [provider]  fluticasone (FLONASE) 50 MCG/ACT nasal spray PLACE 2 SPRAYS INTO THE NOSE DAILY. Patient taking differently: Place 2 sprays into both nostrils daily as needed for allergies. 06/21/20  Yes [name], MD  glimepiride (AMARYL) 1 MG tablet TAKE 1 TABLET BY MOUTH EVERY DAY WITH BREAKFAST 11/01/21  Yes  Elayne Snare, MD  glucose blood (ACCU-CHEK GUIDE) test strip Use Accu chek guide test strips as instructed to check blood sugar three times daily. DX:E11.9 11/28/20  Yes Elayne Snare, MD  glucose blood test strip Use as instructed 02/12/22  Yes Elayne Snare, MD  glucose blood test strip Use to check blood sugar twice a day 02/23/22  Yes Elayne Snare, MD  lisinopril-hydrochlorothiazide (ZESTORETIC) 20-12.5 MG tablet TAKE 1 TABLET BY MOUTH EVERY DAY Patient taking differently: Take 1 tablet by mouth daily. 10/10/21  Yes Elayne Snare, MD  nirmatrelvir & ritonavir (PAXLOVID, 300/100,) 20 x 150 MG & 10 x 100MG TBPK Take as directed 02/26/22  Yes Raylene Everts, MD  omeprazole  (PRILOSEC) 40 MG capsule TAKE 1 CAPSULE BY MOUTH EVERY DAY 12/18/21  Yes [name], MD  Semaglutide, 2 MG/DOSE, (OZEMPIC, 2 MG/DOSE,) 8 MG/3ML SOPN Inject 2 mg weekly for diabetes 11/06/21  Yes Elayne Snare, MD  simvastatin (ZOCOR) 20 MG tablet TAKE 1 TABLET BY MOUTH EVERYDAY AT BEDTIME Patient taking differently: Take 20 mg by mouth at bedtime. 06/26/21  Yes [name], MD  SYNJARDY XR 12.11-998 MG TB24 TAKE 2 TABLETS BY MOUTH DAILY WITH BREAKFAST. 01/16/22  Yes Elayne Snare, MD  Testosterone Undecanoate (JATENZO) 158 MG CAPS Take 1 capsule by mouth 2 (two) times daily after a meal. 02/12/22  Yes Elayne Snare, MD  VIAGRA 100 MG tablet Take 100 mg by mouth daily as needed for erectile dysfunction. 04/01/14  Yes [provider]  acetaminophen (TYLENOL) 500 MG tablet Take 2 tablets (1,000 mg total) by mouth every 6 (six) hours as needed. 10/20/21 10/20/22  Jesusita Oka, MD    Family History Family History  Problem Relation Age of Onset   Diabetes Mother    Stomach cancer Other    Diabetes Maternal Aunt    Diabetes Maternal Grandmother    Heart disease Neg Hx    Colon cancer Neg Hx    Esophageal cancer Neg Hx    Rectal cancer Neg Hx     Social History Social History   Tobacco Use   Smoking status: Never   Smokeless tobacco: Never  Vaping Use   Vaping Use: Never used  Substance Use Topics   Alcohol use: Yes    Comment: rare beer- 3 beers per year   Drug use: Not Currently     Allergies   Celebrex [celecoxib], Penicillins, and Sulfonamide derivatives   Review of Systems Review of Systems  See HPI Physical Exam Triage Vital Signs ED Triage Vitals  Enc Vitals Group     BP 02/26/22 1755 (!) 150/76     Pulse Rate 02/26/22 1755 (!) 114     Resp 02/26/22 1755 20     Temp 02/26/22 1755 (!) 102.1 F (38.9 C)     Temp Source 02/26/22 1755 Oral     SpO2 02/26/22 1755 96 %     Weight 02/26/22 1757 258 lb (117 kg)     Height 02/26/22 1757 '5\' 11"'  (1.803 m)      Head Circumference --      Peak Flow --      Pain Score 02/26/22 1830 0     Pain Loc --      Pain Edu? --      Excl. in [place]? --    No data found.  Updated Vital Signs BP (!) 150/76 (BP Location: Right Arm)   Pulse (!) 114   Temp (!) 102.1 F (38.9 C) (  Oral)   Resp 20   Ht '5\' 11"'  (1.803 m)   Wt 117 kg   SpO2 96%   BMI 35.98 kg/m      Physical Exam Constitutional:      General: He is not in acute distress.    Appearance: He is well-developed. He is ill-appearing.     Comments: Moderately overweight  HENT:     Head: Normocephalic and atraumatic.  Eyes:     Conjunctiva/sclera: Conjunctivae normal.     Pupils: Pupils are equal, round, and reactive to light.  Cardiovascular:     Rate and Rhythm: Normal rate.  Pulmonary:     Effort: Pulmonary effort is normal. No respiratory distress.  Abdominal:     General: There is no distension.     Palpations: Abdomen is soft.  Musculoskeletal:        General: Normal range of motion.     Cervical back: Normal range of motion.  Skin:    General: Skin is warm and dry.  Neurological:     Mental Status: He is alert.     Gait: Gait normal.  Psychiatric:        Mood and Affect: Mood normal.        Behavior: Behavior normal.      UC Treatments / Results  Labs (all labs ordered are listed, but only abnormal results are displayed) Labs Reviewed  POC SARS CORONAVIRUS 2 AG -  ED - Abnormal; Notable for the following components:      Result Value   SARS Coronavirus 2 Ag Positive (*)    All other components within normal limits    EKG   Radiology No results found.  Procedures Procedures (including critical care time)  Medications Ordered in UC Medications  acetaminophen (TYLENOL) tablet 650 mg (650 mg Oral Given 02/26/22 1810)    Initial Impression / Assessment and Plan / UC Course  I have reviewed the triage vital signs and the nursing notes.  Pertinent labs & imaging results that were available during my care of the  patient were reviewed by me and considered in my medical decision making (see chart for details).     Discussed COVID.  Treatment.  Quarantine. Final Clinical Impressions(s) / UC Diagnoses   Final diagnoses:  [id number]     Discharge Instructions      Make sure you are drinking lots of fluids Take Paxlovid 2 times a day for 5 days May take over-the-counter cough and cold medicine as needed May take ibuprofen or Tylenol for pain and fever Must quarantine at home for 5 days and then wear a mask for additional 5 days to prevent spread  Stop your cholesterol medicine Zocor (simvastatin) for the 5 days that you are on Paxlovid   ED Prescriptions     Medication Sig Dispense Auth. Provider   nirmatrelvir & ritonavir (PAXLOVID, 300/100,) 20 x 150 MG & 10 x 100MG TBPK Take as directed 1 each Meda Coffee Jennette Banker, MD      PDMP not reviewed this encounter.   Raylene Everts, MD 02/26/22 [phone]

## 2022-02-26 NOTE — Discharge Instructions (Addendum)
Make sure you are drinking lots of fluids Take Paxlovid 2 times a day for 5 days May take over-the-counter cough and cold medicine as needed May take ibuprofen or Tylenol for pain and fever Must quarantine at home for 5 days and then wear a mask for additional 5 days to prevent spread  Stop your cholesterol medicine Zocor (simvastatin) for the 5 days that you are on Paxlovid

## 2022-02-26 NOTE — ED Triage Notes (Signed)
Patient states that he started feeling bad yesterday, scratchy throat, head congestion, cough, headache, fatigue since yesterday.  Home COVID test today was positive.  Denies any OTC meds.

## 2022-02-27 ENCOUNTER — Telehealth: Payer: Self-pay

## 2022-02-27 NOTE — Telephone Encounter (Signed)
TCT pt to follow up from recent visit. HIPAA compliant VM left for return call.  

## 2022-02-28 MED ORDER — ONETOUCH VERIO VI STRP: ORAL_STRIP | 12 refills | Status: DC

## 2022-02-28 NOTE — Addendum Note (Signed)
Addended by: Cinda Quest on: 02/28/2022 03:39 PM   Modules accepted: Orders

## 2022-03-08 ENCOUNTER — Other Ambulatory Visit: Payer: Self-pay

## 2022-03-08 MED ORDER — ONETOUCH DELICA LANCETS 33G MISC: 12 refills | Status: DC

## 2022-03-12 ENCOUNTER — Other Ambulatory Visit (INDEPENDENT_AMBULATORY_CARE_PROVIDER_SITE_OTHER): Payer: BC Managed Care – PPO

## 2022-03-12 ENCOUNTER — Encounter: Payer: Self-pay | Admitting: Endocrinology

## 2022-03-12 DIAGNOSIS — E291 Testicular hypofunction: Secondary | ICD-10-CM

## 2022-03-13 ENCOUNTER — Other Ambulatory Visit: Payer: Self-pay | Admitting: Endocrinology

## 2022-03-13 LAB — TESTOSTERONE: Testosterone: 259.95 ng/dL — ABNORMAL LOW (ref 300.00–890.00)

## 2022-03-13 MED ORDER — JATENZO 158 MG PO CAPS: 1.0000 | ORAL_CAPSULE | Freq: Two times a day (BID) | ORAL | 2 refills | Status: DC

## 2022-03-21 ENCOUNTER — Other Ambulatory Visit: Payer: Self-pay | Admitting: Endocrinology

## 2022-03-21 MED ORDER — JATENZO 198 MG PO CAPS: ORAL_CAPSULE | ORAL | 3 refills | Status: DC

## 2022-03-21 NOTE — Progress Notes (Signed)
Testosterone level is slightly low, will increase the Jatenzo prescription to the next higher dose of 198 mg twice a day after finishing current supply

## 2022-03-28 ENCOUNTER — Ambulatory Visit: Payer: BC Managed Care – PPO | Admitting: Family Medicine

## 2022-03-28 VITALS — BP 124/72 | HR 84 | Temp 99.0°F | Wt 267.2 lb

## 2022-03-28 DIAGNOSIS — R195 Other fecal abnormalities: Secondary | ICD-10-CM

## 2022-03-28 DIAGNOSIS — R052 Subacute cough: Secondary | ICD-10-CM

## 2022-03-28 DIAGNOSIS — R110 Nausea: Secondary | ICD-10-CM | POA: Diagnosis not present

## 2022-03-28 DIAGNOSIS — K219 Gastro-esophageal reflux disease without esophagitis: Secondary | ICD-10-CM

## 2022-03-28 DIAGNOSIS — R0982 Postnasal drip: Secondary | ICD-10-CM | POA: Diagnosis not present

## 2022-03-28 MED ORDER — LORATADINE 10 MG PO TABS: 10.0000 mg | ORAL_TABLET | Freq: Every day | ORAL | 0 refills | Status: AC

## 2022-03-28 NOTE — Progress Notes (Signed)
Subjective:    Patient ID: Clifford Kramer, male    DOB: 14-Aug-1965, 56 y.o.   MRN: [id number]  Chief Complaint  Patient presents with   Nausea    While eating breakfast on the weekends, will have to get up to go vomit. Will eat later on in the day, ut still feels full, tries to eat, feels like he has to vomit.    Cough    Has gotten better, still has phlegm in throat. Had covid mid July, cough lingered, would get better and then get bad. Earlier this week was a scratch in throat, now it is like he has to clear his throat. Has not tried anything to help, was thinking it would go away.     HPI Patient was seen today for ongoing concerns.  Pt with COVID mid July.  Post nasal drainage and cough have continued though better.  Pt has not tried anything for symptoms.  Pt notes nausea on wknds x 4-5 months.  Endorses early satiety, flatus.  A few months ago started having emesis with dinner.  Having soft stools or diarrhea.  Denies abd pain.  H/o Ozempic use.  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   Gout    Hyperlipidemia    Hypertension    RA (rheumatoid arthritis) ([place])    Sleep apnea    CPAP use     Allergies  Allergen Reactions   Celebrex [Celecoxib] Itching   Penicillins Itching   Sulfonamide Derivatives Itching    ROS General: Denies fever, chills, night sweats, changes in weight +changes in appetite, early satiety HEENT: Denies headaches, ear pain, changes in vision, rhinorrhea, sore throat CV: Denies CP, palpitations, SOB, orthopnea Pulm: Denies SOB, cough, wheezing  +cough GI: Denies abdominal pain, nausea, vomiting, diarrhea, constipation +diarrhea, n/v, increased flatus GU: Denies dysuria, hematuria, frequency Msk: Denies muscle cramps, joint pains Neuro: Denies weakness, numbness, tingling Skin: Denies rashes, bruising Psych: Denies depression, anxiety, hallucinations     Objective:     Blood pressure 124/72, pulse 84, temperature 99 F (37.2 C), temperature source Oral, weight 267 lb 3.2 oz (121.2 kg), SpO2 96 %.  Gen. Pleasant, well-nourished, in no distress, normal affect   HEENT: [place]/AT, face symmetric, conjunctiva clear, no scleral icterus, PERRLA, EOMI, nares patent without drainage, pharynx with postnasal drainage,  no erythema or exudate.  TMs full b/l. Lungs: no accessory muscle use, CTAB, no wheezes or rales Cardiovascular: RRR, no m/r/g, no peripheral edema Abdomen: BS present, soft, NT/ND, no hepatosplenomegaly. Musculoskeletal: No deformities, no cyanosis or clubbing, normal tone Neuro:  A&Ox3, CN II-XII intact, normal gait Skin:  Warm, no lesions/ rash   Wt Readings from Last 3 Encounters:  02/26/22 258 lb (117 kg)  02/12/22 263 lb 3.2 oz (119.4 kg)  11/06/21 266 lb (120.7 kg)    Lab Results  Component Value Date   WBC 6.4 02/09/2022   HGB 13.9 02/09/2022   HCT 42.2 02/09/2022   PLT 271 02/09/2022   GLUCOSE 80 02/09/2022   CHOL 103 05/29/2021   TRIG 94.0 05/29/2021   HDL 31.30 (L) 05/29/2021   LDLCALC 52 05/29/2021   ALT 14 10/30/2021   AST 13 10/30/2021   NA 137 02/09/2022   K 4.2 02/09/2022   CL 100 02/09/2022   CREATININE 1.28 (H) 02/09/2022   BUN 14 02/09/2022   CO2 20 02/09/2022   TSH 3.31 05/29/2021  PSA 0.21 05/29/2021   HGBA1C 6.9 (H) 02/09/2022   MICROALBUR <0.7 10/30/2021    Assessment/Plan:  Nausea -concern for gastroparesis given h/o Ozempic use. -try eating small portions several times per day  - Plan: CMP, TSH, T4, Free, CBC with Differential/Platelet, Ambulatory referral to Gastroenterology  Post-nasal drainage  - Plan: loratadine (CLARITIN) 10 MG tablet  Gastroesophageal reflux disease, unspecified whether esophagitis present  -avoid foods known to cause problems - Plan: CMP, CBC with Differential/Platelet, Ambulatory referral to Gastroenterology  Subacute cough  -postviral cough likely exacerbated by  seasonal allergies -supportive care - Plan: CBC with Differential/Platelet  Loose stools - Plan: Ambulatory referral to Gastroenterology  F/u prn  Grier Mitts, MD

## 2022-03-28 NOTE — Patient Instructions (Addendum)
Referral to GI was placed.  You should expect a phone call about scheduling this appointment.  Orders for labs were placed.  You can stop by at your convenience to have those done.  If there is a specific day that you want to come back let the clinic know so they can put you on the lab schedule.  I have included some information about gastroparesis as it could possibly be contributing to symptoms given your history of Ozempic use.  Try eating smaller portions to see if you notice improvement in your symptoms.

## 2022-04-08 ENCOUNTER — Encounter: Payer: Self-pay | Admitting: Family Medicine

## 2022-04-09 ENCOUNTER — Other Ambulatory Visit: Payer: Self-pay | Admitting: Endocrinology

## 2022-04-11 ENCOUNTER — Encounter: Payer: Self-pay | Admitting: Endocrinology

## 2022-04-11 DIAGNOSIS — E1165 Type 2 diabetes mellitus with hyperglycemia: Secondary | ICD-10-CM

## 2022-04-11 MED ORDER — OZEMPIC (2 MG/DOSE) 8 MG/3ML ~~LOC~~ SOPN: PEN_INJECTOR | SUBCUTANEOUS | 3 refills | Status: DC

## 2022-04-30 ENCOUNTER — Encounter: Payer: Self-pay | Admitting: Endocrinology

## 2022-04-30 DIAGNOSIS — E1165 Type 2 diabetes mellitus with hyperglycemia: Secondary | ICD-10-CM

## 2022-05-01 ENCOUNTER — Other Ambulatory Visit: Payer: Self-pay | Admitting: Endocrinology

## 2022-05-02 ENCOUNTER — Other Ambulatory Visit: Payer: Self-pay | Admitting: Endocrinology

## 2022-05-02 DIAGNOSIS — E1165 Type 2 diabetes mellitus with hyperglycemia: Secondary | ICD-10-CM

## 2022-05-02 MED ORDER — SEMAGLUTIDE (1 MG/DOSE) 4 MG/3ML ~~LOC~~ SOPN: 1.0000 mg | PEN_INJECTOR | SUBCUTANEOUS | 0 refills | Status: DC

## 2022-05-07 ENCOUNTER — Telehealth: Payer: Self-pay

## 2022-05-07 DIAGNOSIS — E1165 Type 2 diabetes mellitus with hyperglycemia: Secondary | ICD-10-CM

## 2022-05-07 NOTE — Telephone Encounter (Signed)
Ozempic '1mg'$  on backorder. Please advise

## 2022-05-10 ENCOUNTER — Other Ambulatory Visit: Payer: Self-pay | Admitting: Endocrinology

## 2022-05-10 DIAGNOSIS — E1165 Type 2 diabetes mellitus with hyperglycemia: Secondary | ICD-10-CM

## 2022-05-16 ENCOUNTER — Other Ambulatory Visit (HOSPITAL_COMMUNITY): Payer: Self-pay

## 2022-05-16 MED ORDER — OZEMPIC (1 MG/DOSE) 4 MG/3ML ~~LOC~~ SOPN
PEN_INJECTOR | SUBCUTANEOUS | 0 refills | Status: DC
  Filled 2022-05-16: qty 3, 28d supply, fill #0

## 2022-05-16 NOTE — Addendum Note (Signed)
Addended by: Cinda Quest on: 05/16/2022 03:04 PM   Modules accepted: Orders

## 2022-05-22 ENCOUNTER — Other Ambulatory Visit: Payer: Self-pay | Admitting: Family Medicine

## 2022-06-10 ENCOUNTER — Other Ambulatory Visit: Payer: Self-pay | Admitting: Family Medicine

## 2022-06-11 ENCOUNTER — Other Ambulatory Visit (HOSPITAL_COMMUNITY): Payer: Self-pay

## 2022-06-11 ENCOUNTER — Other Ambulatory Visit: Payer: Self-pay | Admitting: Endocrinology

## 2022-06-11 DIAGNOSIS — E1165 Type 2 diabetes mellitus with hyperglycemia: Secondary | ICD-10-CM

## 2022-06-11 MED ORDER — OZEMPIC (1 MG/DOSE) 4 MG/3ML ~~LOC~~ SOPN
1.0000 mg | PEN_INJECTOR | SUBCUTANEOUS | 0 refills | Status: DC
  Filled 2022-06-11: qty 3, 28d supply, fill #0

## 2022-06-20 ENCOUNTER — Ambulatory Visit (INDEPENDENT_AMBULATORY_CARE_PROVIDER_SITE_OTHER): Payer: BC Managed Care – PPO | Admitting: Family Medicine

## 2022-06-20 VITALS — BP 116/78 | HR 81 | Temp 98.5°F | Ht 71.0 in | Wt 260.4 lb

## 2022-06-20 DIAGNOSIS — E1169 Type 2 diabetes mellitus with other specified complication: Secondary | ICD-10-CM

## 2022-06-20 DIAGNOSIS — E782 Mixed hyperlipidemia: Secondary | ICD-10-CM | POA: Diagnosis not present

## 2022-06-20 DIAGNOSIS — Z0000 Encounter for general adult medical examination without abnormal findings: Secondary | ICD-10-CM | POA: Diagnosis not present

## 2022-06-20 DIAGNOSIS — M109 Gout, unspecified: Secondary | ICD-10-CM

## 2022-06-20 DIAGNOSIS — Z23 Encounter for immunization: Secondary | ICD-10-CM

## 2022-06-20 DIAGNOSIS — I10 Essential (primary) hypertension: Secondary | ICD-10-CM | POA: Diagnosis not present

## 2022-06-20 DIAGNOSIS — B353 Tinea pedis: Secondary | ICD-10-CM

## 2022-06-20 LAB — CBC WITH DIFFERENTIAL/PLATELET
Basophils Absolute: 0.1 10*3/uL (ref 0.0–0.1)
Basophils Relative: 0.8 % (ref 0.0–3.0)
Eosinophils Absolute: 0.1 10*3/uL (ref 0.0–0.7)
Eosinophils Relative: 1.8 % (ref 0.0–5.0)
HCT: 43.5 % (ref 39.0–52.0)
Hemoglobin: 14.1 g/dL (ref 13.0–17.0)
Lymphocytes Relative: 38.7 % (ref 12.0–46.0)
Lymphs Abs: 2.8 10*3/uL (ref 0.7–4.0)
MCHC: 32.5 g/dL (ref 30.0–36.0)
MCV: 69.1 fl — ABNORMAL LOW (ref 78.0–100.0)
Monocytes Absolute: 0.7 10*3/uL (ref 0.1–1.0)
Monocytes Relative: 9.1 % (ref 3.0–12.0)
Neutro Abs: 3.6 10*3/uL (ref 1.4–7.7)
Neutrophils Relative %: 49.6 % (ref 43.0–77.0)
Platelets: 299 10*3/uL (ref 150.0–400.0)
RBC: 6.3 Mil/uL — ABNORMAL HIGH (ref 4.22–5.81)
RDW: 17.3 % — ABNORMAL HIGH (ref 11.5–15.5)
WBC: 7.2 10*3/uL (ref 4.0–10.5)

## 2022-06-20 LAB — COMPREHENSIVE METABOLIC PANEL
ALT: 17 U/L (ref 0–53)
AST: 20 U/L (ref 0–37)
Albumin: 4.6 g/dL (ref 3.5–5.2)
Alkaline Phosphatase: 77 U/L (ref 39–117)
BUN: 19 mg/dL (ref 6–23)
CO2: 26 mEq/L (ref 19–32)
Calcium: 9.3 mg/dL (ref 8.4–10.5)
Chloride: 103 mEq/L (ref 96–112)
Creatinine, Ser: 1.4 mg/dL (ref 0.40–1.50)
GFR: 55.45 mL/min — ABNORMAL LOW (ref >60.00)
Glucose, Bld: 101 mg/dL — ABNORMAL HIGH (ref 70–99)
Potassium: 4 mEq/L (ref 3.5–5.1)
Sodium: 137 mEq/L (ref 135–145)
Total Bilirubin: 0.6 mg/dL (ref 0.2–1.2)
Total Protein: 7.8 g/dL (ref 6.0–8.3)

## 2022-06-20 LAB — URIC ACID: Uric Acid, Serum: 3 mg/dL — ABNORMAL LOW (ref 4.0–7.8)

## 2022-06-20 LAB — LIPID PANEL
Cholesterol: 115 mg/dL (ref 0–200)
HDL: 31.7 mg/dL — ABNORMAL LOW (ref >39.00)
LDL Cholesterol: 60 mg/dL (ref 0–99)
NonHDL: 83.49
Total CHOL/HDL Ratio: 4
Triglycerides: 116 mg/dL (ref 0.0–149.0)
VLDL: 23.2 mg/dL (ref 0.0–40.0)

## 2022-06-20 LAB — HEMOGLOBIN A1C: Hgb A1c MFr Bld: 8 % — ABNORMAL HIGH (ref 4.6–6.5)

## 2022-06-20 MED ORDER — LOSARTAN POTASSIUM-HCTZ 50-12.5 MG PO TABS: 1.0000 | ORAL_TABLET | Freq: Every day | ORAL | 1 refills | Status: DC

## 2022-06-20 NOTE — Progress Notes (Signed)
Subjective:     Clifford Kramer is a 56 y.o. male and is here for a comprehensive physical exam. The patient reports pruritic, dried, peeling skin between toes of b/l feet.  Pt seen by Rheum, told had rattling on left side of bones.  Patient endorses increased acid, cough, hoarse voice x 3 wks. has tickle in throat at times.  Denies nasal drainage.  Patient also has a list of labs requested by rheumatology.  Social History   Socioeconomic History   Marital status: Married    Spouse name: Not on file   Number of children: Not on file   Years of education: Not on file   Highest education level: Not on file  Occupational History   Not on file  Tobacco Use   Smoking status: Never   Smokeless tobacco: Never  Vaping Use   Vaping Use: Never used  Substance and Sexual Activity   Alcohol use: Yes    Comment: rare beer- 3 beers per year   Drug use: Not Currently   Sexual activity: Yes  Other Topics Concern   Not on file  Social History Narrative   Not on file   Social Determinants of Health   Financial Resource Strain: Not on file  Food Insecurity: Not on file  Transportation Needs: Not on file  Physical Activity: Not on file  Stress: Not on file  Social Connections: Not on file  Intimate Partner Violence: Not on file   Health Maintenance  Topic Date Due   HIV Screening  Never done   OPHTHALMOLOGY EXAM  09/10/2018   FOOT EXAM  02/27/2022   COVID-19 Vaccine (7 - 2023-24 season) 03/30/2022   HEMOGLOBIN A1C  08/12/2022   COLONOSCOPY (Pts 45-19yr Insurance coverage will need to be confirmed)  10/22/2022   Diabetic kidney evaluation - Urine ACR  10/31/2022   Diabetic kidney evaluation - GFR measurement  02/10/2023   INFLUENZA VACCINE  Completed   Hepatitis C Screening  Completed   Zoster Vaccines- Shingrix  Completed   HPV VACCINES  Aged Out    The following portions of the patient's history were reviewed and updated as appropriate: allergies, current medications, past family  history, past medical history, past social history, past surgical history, and problem list.  Review of Systems Pertinent items noted in HPI and remainder of comprehensive ROS otherwise negative.   Objective:    BP 116/78 (BP Location: Left Arm, Patient Position: Sitting, Cuff Size: Large)   Pulse 81   Temp 98.5 F (36.9 C) (Oral)   Ht '5\' 11"'$  (1.803 m)   Wt 260 lb 6.4 oz (118.1 kg)   SpO2 94%   BMI 36.32 kg/m  General appearance: alert, cooperative, and no distress Head: Normocephalic, without obvious abnormality, atraumatic Eyes: conjunctivae/corneas clear. PERRL, EOM's intact. Fundi benign. Ears: normal TM's and external ear canals both ears Nose: Nares normal. Septum midline. Mucosa normal. No drainage or sinus tenderness. Throat: lips, mucosa, and tongue normal; teeth and gums normal Neck: no adenopathy, no carotid bruit, no JVD, supple, symmetrical, trachea midline, and thyroid not enlarged, symmetric, no tenderness/mass/nodules Lungs: clear to auscultation bilaterally Heart: regular rate and rhythm, S1, S2 normal, no murmur, click, rub or gallop Abdomen: soft, non-tender; bowel sounds normal; no masses,  no organomegaly Extremities: extremities normal, atraumatic, no cyanosis or edema Pulses: 2+ and symmetric Skin: Skin color, texture, turgor normal. No rashes or lesions Lymph nodes: Cervical, supraclavicular, and axillary nodes normal. Neurologic: Alert and oriented X 3, normal strength  and tone. Normal symmetric reflexes. Normal coordination and gait    Diabetic Foot Exam - Simple   Simple Foot Form Diabetic Foot exam was performed with the following findings: Yes 06/20/2022  2:39 PM  Visual Inspection No deformities, no ulcerations, no other skin breakdown bilaterally: Yes Sensation Testing Intact to touch and monofilament testing bilaterally: Yes Pulse Check Posterior Tibialis and Dorsalis pulse intact bilaterally: Yes Comments Dried peeling skin between toes of  b/l feet.     Assessment:    Healthy male exam.     Plan:    Anticipatory guidance given including wearing seatbelts, smoke detectors in the home, increasing physical activity, increasing p.o. intake of water and vegetables. -Labs -Colonoscopy done 10/21/2017 See After Visit Summary for Counseling Recommendations  Well adult exam - Plan: CMP, CBC with Differential/Platelet, Hemoglobin A1c, Lipid panel, Uric Acid  Need for influenza vaccination  - Plan: Flu Vaccine QUAD 6+ mos PF IM (Fluarix Quad PF)  Type 2 diabetes mellitus with other specified complication, without long-term current use of insulin (HCC) -Hemoglobin A1c 6.9% on 02/09/2022 -Continue follow-up with endocrinology -Continue current medications including glimepiride, Ozempic, Synjardy -Foot exam done this visit -Continue ARB and statin -Patient encouraged to schedule diabetic retinopathy screening  - Plan: Hemoglobin A1c, Lipid panel  Mixed hyperlipidemia  -Lifestyle modifications -Total cholesterol 103, HDL 31.3, LDL 52, triglycerides 94 on 05/29/2021 -Continue Zocor 20 mg daily - Plan: Lipid panel  Gout, unspecified cause, unspecified chronicity, unspecified site -Stable -Labs requested by rheumatology  - Plan: Uric Acid  Essential hypertension Controlled -Continue current medications-  - Plan: losartan-hydrochlorothiazide (HYZAAR) 50-12.5 MG tablet  Dried skin between toes -Discussed applying moisturizer to skin daily -If develops pruritus OTC antifungal medication Follow-up as needed  Grier Mitts, MD

## 2022-06-25 ENCOUNTER — Other Ambulatory Visit: Payer: Self-pay | Admitting: Internal Medicine

## 2022-06-25 ENCOUNTER — Other Ambulatory Visit (INDEPENDENT_AMBULATORY_CARE_PROVIDER_SITE_OTHER): Payer: BC Managed Care – PPO

## 2022-06-25 DIAGNOSIS — E291 Testicular hypofunction: Secondary | ICD-10-CM | POA: Diagnosis not present

## 2022-06-26 LAB — PSA: PSA: 0.24 ng/mL (ref 0.10–4.00)

## 2022-06-28 ENCOUNTER — Ambulatory Visit: Payer: BC Managed Care – PPO | Admitting: Endocrinology

## 2022-06-28 NOTE — Progress Notes (Unsigned)
Patient ID: Clifford Kramer, male   DOB: 1965-08-11, 56 y.o.   MRN: [id number]    Reason for Appointment : Endocrinology follow-up  History of Present Illness          Diagnosis: Type 2 diabetes mellitus, date of diagnosis: 2005     Background information: He was probably started on metformin at the time of diagnosis when his blood sugars were not very high About 5-6 years ago because of higher blood sugars he was also given glipizide  He appeared to have  progression of his diabetes with A1c going up to 8.1% in 01/2013; also had difficulty with losing weight before he was started on Victoza.  With this his blood sugars were significantly better His glipizide was reduced to 2.5 mg in 10/14 because of rare hypoglycemia    RECENT history:   Antihyperglycemic drugs : Ozempic 1 mg weekly, and Amaryl 1 mg a.m., Synjardy XR 12.11/998, 2 tablets daily at 8 am  Work routine: He starts work at [employer] AM for [employer]  Current management, blood sugar patterns and problems: His A1c is significantly higher at 8% compared to 6.9 He apparently had been off Ozempic for 2 months because of lack of supply at the pharmacy  Only last month he was able to start off with Ozempic again but only with the 1 mg dose compared to 2 mg He has gained some weight Overall also has not been able to exercise like usual and less walking  Blood sugars have been checked again infrequently and only appear to be relatively high fasting Highest glucose 176 and lab glucose last month 101 Renal function slightly worse, on Jardiance currently No hypoglycemic symptoms with Amaryl   Side effects from medications have been: None        Monitors blood glucose: once a day or twice          Glucometer:      Accu-Chek  Blood Glucose readings from monitor review   PRE-MEAL Fasting Lunch Dinner Bedtime Overall  Glucose range: 160, 176      Mean/median:     143   POST-MEAL PC Breakfast PC Lunch PC Dinner  Glucose range:  153, 171  125, 163  Mean/median:       Previous readings: Range 109-126 Recent 30-day average 119     LIFESTYLE: Meals: 3 meals per day. Dinner 5 pm, Bfst 9 am; lunch 2 pm;  eating out periodically  His breakfast:  eggs and toast or meat, sometimes fast food sandwiches Usually eating fruit and sandwich at lunch, usually a grilled meat at suppertime   Dietician visit: 05/2013           Wt Readings from Last 3 Encounters:  06/29/22 268 lb (121.6 kg)  06/20/22 260 lb 6.4 oz (118.1 kg)  03/28/22 267 lb 3.2 oz (121.2 kg)   Lab Results  Component Value Date   HGBA1C 8.0 (H) 06/20/2022   HGBA1C 6.9 (H) 02/09/2022   HGBA1C 7.8 (H) 10/30/2021   Lab Results  Component Value Date   MICROALBUR <0.7 10/30/2021   LDLCALC 60 06/20/2022   CREATININE 1.40 06/20/2022    Other active problems including new problems are in review of systems    Lab on 06/25/2022  Component Date Value Ref Range Status   Testosterone, Serum (Total) 06/25/2022 128 (L)  ng/dL Final   Comment: This test was developed and its performance characteristics determined by [facility]. It has not been cleared or approved by  the Food and Drug Administration. Reference Range: Adult Males >18 years    50 - 28 This [facility] LC/MS-MS method is currently certified by the [facility] Hormone Standardization Program (HoST).  Adult male reference interval is based on a population of healthy nonobese males (BMI <30) between 20 and 85 years old. Maximiano Coss 6659,[id number];[id number] PMID: [id number].    % Free Testosterone 06/25/2022 2.3  % Final   Comment: This test was developed and its performance characteristics determined by [facility]. It has not been cleared or approved by the Food and Drug Administration. Reference Range: Adult Males: 1.5 - 3.2    Free Testosterone, S 06/25/2022 29 (L)  pg/mL Final   Comment: Reference Range: Adult Males: 89 - 280    Sex Hormone Binding Globulin 06/25/2022 14.0 (L)  nmol/L Final    Comment: Reference Range: >49y: 19.3 - 76.4    PSA 06/25/2022 0.24  0.10 - 4.00 ng/mL Final   Test performed using Access Hybritech PSA Assay, a parmagnetic partical, chemiluminecent immunoassay.    Allergies as of 06/29/2022       Reactions   Celebrex [celecoxib] Itching   Penicillins Itching   Sulfonamide Derivatives Itching        Medication List        Accurate as of June 29, 2022 11:59 PM. If you have any questions, ask your nurse or doctor.          Accu-Chek FastClix Lancets Misc Use to check blood sugar 3 times daily. Dx Code A07.6   OneTouch Delica Lancets 22Q Misc Check blood sugar 2 times daily   Accu-Chek Guide w/Device Kit 1 each by Does not apply route 3 (three) times daily. Use accu chek guide device to check blood sugar twice daily. DX:E11.9   acetaminophen 500 MG tablet Commonly known as: TYLENOL Take 2 tablets (1,000 mg total) by mouth every 6 (six) hours as needed.   allopurinol 300 MG tablet Commonly known as: ZYLOPRIM TAKE 1 TABLET BY MOUTH EVERY DAY   amLODipine 2.5 MG tablet Commonly known as: NORVASC Take 1 tablet (2.5 mg total) by mouth daily.   aspirin 81 MG tablet Take 81 mg by mouth daily.   BD Pen Needle Nano U/F 32G X 4 MM Misc Generic drug: Insulin Pen Needle USE EVERY DAY   Enbrel SureClick 50 MG/ML injection Generic drug: etanercept Inject 50 mg into the skin every Monday.   fluticasone 50 MCG/ACT nasal spray Commonly known as: FLONASE PLACE 2 SPRAYS INTO THE NOSE DAILY. What changed: See the new instructions.   glimepiride 1 MG tablet Commonly known as: AMARYL TAKE 1 TABLET BY MOUTH EVERY DAY WITH BREAKFAST   Jatenzo 198 MG Caps Generic drug: Testosterone Undecanoate 1 capsule with food twice a day   loratadine 10 MG tablet Commonly known as: CLARITIN Take 1 tablet (10 mg total) by mouth daily.   losartan-hydrochlorothiazide 50-12.5 MG tablet Commonly known as: HYZAAR Take 1 tablet by mouth daily.    omeprazole 40 MG capsule Commonly known as: PRILOSEC TAKE 1 CAPSULE BY MOUTH EVERY DAY   OneTouch Verio test strip Generic drug: glucose blood Use to check blood sugar twice a day   Ozempic (2 MG/DOSE) 8 MG/3ML Sopn Generic drug: Semaglutide (2 MG/DOSE) Inject 2 mg weekly for diabetes   Ozempic (2 MG/DOSE) 8 MG/3ML Sopn Generic drug: Semaglutide (2 MG/DOSE) INJECT 2 MG WEEKLY FOR DIABETES   Ozempic (1 MG/DOSE) 4 MG/3ML Sopn Generic drug: Semaglutide (1 MG/DOSE) Inject 1 mg into the  skin once a week as directed.   Paxlovid (300/100) 20 x 150 MG & 10 x 100MG Tbpk Generic drug: nirmatrelvir & ritonavir Take as directed   simvastatin 20 MG tablet Commonly known as: ZOCOR TAKE 1 TABLET BY MOUTH EVERYDAY AT BEDTIME What changed: See the new instructions.   Synjardy XR 12.11-998 MG Tb24 Generic drug: Empagliflozin-metFORMIN HCl ER TAKE 2 TABLETS BY MOUTH DAILY WITH BREAKFAST.   Viagra 100 MG tablet Generic drug: sildenafil Take 100 mg by mouth daily as needed for erectile dysfunction.        Allergies:  Allergies  Allergen Reactions   Celebrex [Celecoxib] Itching   Penicillins Itching   Sulfonamide Derivatives Itching    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   Gout    Hyperlipidemia    Hypertension    RA (rheumatoid arthritis) ([place])    Sleep apnea    CPAP use     Past Surgical History:  Procedure Laterality Date   COLONOSCOPY  2015   INSERTION OF MESH N/A 10/20/2021   Procedure: INSERTION OF MESH;  Surgeon: Jesusita Oka, MD;  Location: [facility];  Service: General;  Laterality: N/A;   KNEE SURGERY Left 2005   VENTRAL HERNIA REPAIR N/A 10/20/2021   Procedure: LAPAROSCOPIC VENTRAL HERNIA REPAIR;  Surgeon: Jesusita Oka, MD;  Location: [facility];  Service: General;  Laterality: N/A;    Family History  Problem Relation Age of Onset   Diabetes Mother    Stomach cancer Other     Diabetes Maternal Aunt    Diabetes Maternal Grandmother    Heart disease Neg Hx    Colon cancer Neg Hx    Esophageal cancer Neg Hx    Rectal cancer Neg Hx     Social History:  reports that he has never smoked. He has never used smokeless tobacco. He reports current alcohol use. He reports that he does not currently use drugs.    Review of Systems    HYPERTENSION: This is usually controlled with Norvasc 2.5 mg and  20 mg lisinopril HCTZ Blood pressure is unusually high today, checked twice He has not checked at home recently but previously he thinks systolic readings have been below 120  BP Readings from Last 3 Encounters:  06/29/22 (!) 142/84  06/20/22 116/78  03/28/22 124/72   Creatinine relatively higher     Lab Results  Component Value Date   CREATININE 1.40 06/20/2022   CREATININE 1.28 (H) 02/09/2022   CREATININE 1.21 10/30/2021     GOUT: He has had history of recurrent episodes of probable gout in various joints including knee and elbow in the past Baseline uric acid was 8.4 done in 2012  Taking allopurinol 300 mg daily regularly with last uric acid 3.3  He is taking Enbrel for his rheumatoid arthritis from rheumatologist     Hyperlipidemia: LDL has been controlled, baseline previously 109, has been prescribed simvastatin 20 mg by his PCP. Has a low HDL  Lab Results  Component Value Date   CHOL 115 06/20/2022   HDL 31.70 (L) 06/20/2022   LDLCALC 60 06/20/2022   TRIG 116.0 06/20/2022   CHOLHDL 4 06/20/2022       HYPOGONADISM:  He has been on testosterone supplements since 2011 when his level was mildly low around 300  but no further evaluation done.   He has a relatively low testosterone level while using Axiron  and had difficulty with the application With Androderm patches he had improved testosterone levels and less fatigue  His testosterone level had been inconsistent with using 8 mg Androderm and was taking the 6 mg total dose  He was getting more  skin irritation and sensitivity to the Androderm, also had nasal irritation/bad taste with Natesto Not taking that Jatenzo 198 mg twice a day with food Although he is not feeling significantly fatigued he thinks he has overall less motivation than before  Testosterone level previously consistently just over 300   Lab Results  Component Value Date   TESTOSTERONE 259.95 (L) 03/12/2022   Lab Results  Component Value Date   HGB 14.1 06/20/2022     He takes Viagra/ Cialis for erectile dysfunction, also was given Cialis by the [facility] with only intermittent benefit  EYE exams: These are done in January at [facility] and no reports available   LABS:  Lab on 06/25/2022  Component Date Value Ref Range Status   Testosterone, Serum (Total) 06/25/2022 128 (L)  ng/dL Final   Comment: This test was developed and its performance characteristics determined by [facility]. It has not been cleared or approved by the Food and Drug Administration. Reference Range: Adult Males >18 years    67 - 72 This [facility] LC/MS-MS method is currently certified by the [facility] Hormone Standardization Program (HoST).  Adult male reference interval is based on a population of healthy nonobese males (BMI <30) between 31 and 18 years old. Maximiano Coss 1025,852;7782-4235 PMID: [id number].    % Free Testosterone 06/25/2022 2.3  % Final   Comment: This test was developed and its performance characteristics determined by [facility]. It has not been cleared or approved by the Food and Drug Administration. Reference Range: Adult Males: 1.5 - 3.2    Free Testosterone, S 06/25/2022 29 (L)  pg/mL Final   Comment: Reference Range: Adult Males: 69 - 280    Sex Hormone Binding Globulin 06/25/2022 14.0 (L)  nmol/L Final   Comment: Reference Range: >49y: 19.3 - 76.4    PSA 06/25/2022 0.24  0.10 - 4.00 ng/mL Final   Test performed using Access Hybritech PSA Assay, a parmagnetic partical,  chemiluminecent immunoassay.     Physical Examination:   BP (!) 142/84   Pulse 85   Ht _0  (1.803 m)   Wt 268 lb (121.6 kg)   SpO2 98%   BMI 37.38 kg/m         ASSESSMENT/PLAN:   Diabetes type 2, with obesity  See history of present illness for detailed discussion of his current management, blood sugar patterns and problems identified  His A1c is much higher at 8%  He is taking Synjardy, Ozempic 1 mg daily and Amaryl 1 mg daily  Blood sugars are likely not well controlled because of difficulty getting Ozempic consistently and also only able to get the 1 mg dose currently Also has not exercised consistently and has gained weight  HYPERTENSION:  His blood pressure is well controlled usually but unusually high today only He will monitor at home and follow-up with PCP or let us know if it is unusually high or low  Current renal function: Creatinine slightly higher for no apparent reason, will need to continue monitoring   Hypogonadism:   He is on twice a day Jatenzo 198 mg and has been regular with this The dose was increased in August May have some decreased motivation and fatigue also currently  His testosterone level is  relatively low even with 198 mg Jatenzo and will need to go up on this further  He will come back for lab work for testosterone about 6 hours after his Jatenzo dose    There are no Patient Instructions on file for this visit.     Elayne Snare 07/01/2022, 12:06 PM

## 2022-06-29 ENCOUNTER — Ambulatory Visit: Payer: BC Managed Care – PPO | Admitting: Endocrinology

## 2022-06-29 ENCOUNTER — Encounter: Payer: Self-pay | Admitting: Endocrinology

## 2022-06-29 VITALS — BP 142/84 | HR 85 | Ht 71.0 in | Wt 268.0 lb

## 2022-06-29 DIAGNOSIS — I10 Essential (primary) hypertension: Secondary | ICD-10-CM

## 2022-06-29 DIAGNOSIS — E291 Testicular hypofunction: Secondary | ICD-10-CM | POA: Diagnosis not present

## 2022-06-29 DIAGNOSIS — E1165 Type 2 diabetes mellitus with hyperglycemia: Secondary | ICD-10-CM | POA: Diagnosis not present

## 2022-06-30 LAB — TESTOSTERONE, FREE AND TOTAL (INCLUDES SHBG)-(MALES)
% Free Testosterone: 2.3 %
Free Testosterone, S: 29 pg/mL — ABNORMAL LOW
Sex Hormone Binding Globulin: 14 nmol/L — ABNORMAL LOW
Testosterone, Serum (Total): 128 ng/dL — ABNORMAL LOW

## 2022-07-01 ENCOUNTER — Encounter: Payer: Self-pay | Admitting: Endocrinology

## 2022-07-01 DIAGNOSIS — E1165 Type 2 diabetes mellitus with hyperglycemia: Secondary | ICD-10-CM

## 2022-07-01 MED ORDER — JATENZO 237 MG PO CAPS: ORAL_CAPSULE | ORAL | 5 refills | Status: DC

## 2022-07-04 ENCOUNTER — Encounter: Payer: Self-pay | Admitting: Family Medicine

## 2022-07-08 ENCOUNTER — Encounter: Payer: Self-pay | Admitting: Endocrinology

## 2022-07-08 DIAGNOSIS — E1165 Type 2 diabetes mellitus with hyperglycemia: Secondary | ICD-10-CM

## 2022-07-09 ENCOUNTER — Other Ambulatory Visit (HOSPITAL_COMMUNITY): Payer: Self-pay

## 2022-07-09 MED ORDER — OZEMPIC (1 MG/DOSE) 4 MG/3ML ~~LOC~~ SOPN
1.0000 mg | PEN_INJECTOR | SUBCUTANEOUS | 0 refills | Status: DC
  Filled 2022-07-09: qty 3, 28d supply, fill #0
  Filled 2022-07-16: qty 9, 90d supply, fill #0

## 2022-07-14 ENCOUNTER — Other Ambulatory Visit (HOSPITAL_COMMUNITY): Payer: Self-pay

## 2022-07-16 ENCOUNTER — Other Ambulatory Visit (HOSPITAL_COMMUNITY): Payer: Self-pay

## 2022-07-17 ENCOUNTER — Other Ambulatory Visit (HOSPITAL_COMMUNITY): Payer: Self-pay

## 2022-07-17 MED ORDER — SEMAGLUTIDE (1 MG/DOSE) 4 MG/3ML ~~LOC~~ SOPN: 1.0000 mg | PEN_INJECTOR | SUBCUTANEOUS | 1 refills | Status: DC

## 2022-07-17 MED ORDER — SEMAGLUTIDE (1 MG/DOSE) 4 MG/3ML ~~LOC~~ SOPN
1.0000 mg | PEN_INJECTOR | SUBCUTANEOUS | 1 refills | Status: DC
  Filled 2022-07-17: qty 3, 28d supply, fill #0

## 2022-07-17 NOTE — Addendum Note (Signed)
Addended by: Cinda Quest on: 07/17/2022 08:42 AM   Modules accepted: Orders

## 2022-07-17 NOTE — Addendum Note (Signed)
Addended by: Cinda Quest on: 07/17/2022 05:19 PM   Modules accepted: Orders

## 2022-07-18 ENCOUNTER — Other Ambulatory Visit (HOSPITAL_COMMUNITY): Payer: Self-pay

## 2022-07-19 ENCOUNTER — Other Ambulatory Visit (HOSPITAL_COMMUNITY): Payer: Self-pay

## 2022-07-19 ENCOUNTER — Ambulatory Visit
Admission: EM | Admit: 2022-07-19 | Discharge: 2022-07-19 | Disposition: A | Discharge status: Home / Self Care | Payer: BC Managed Care – PPO | Attending: Family Medicine | Admitting: Family Medicine

## 2022-07-19 DIAGNOSIS — J209 Acute bronchitis, unspecified: Secondary | ICD-10-CM | POA: Diagnosis not present

## 2022-07-19 MED ORDER — DOXYCYCLINE HYCLATE 100 MG PO CAPS: ORAL_CAPSULE | ORAL | 0 refills | Status: DC

## 2022-07-19 NOTE — Discharge Instructions (Signed)
Take plain guaifenesin ('1200mg'$  extended release tabs such as Mucinex) twice daily, with plenty of water, for cough and congestion. Get adequate rest.   May continue Afrin nasal spray for about 5 days and then discontinue.  Also recommend using saline nasal spray several times daily and saline nasal irrigation (AYR is a common brand).  Use Flonase nasal spray each morning after using Afrin nasal spray and saline nasal irrigation. Try warm salt water gargles for sore throat.  Stop all antihistamines for now, and other non-prescription cough/cold preparations. May take Delsym Cough Suppressant ("12 Hour Cough Relief") at bedtime for nighttime cough.

## 2022-07-19 NOTE — ED Triage Notes (Signed)
Pt presents with c/o cough that began Monday.

## 2022-07-19 NOTE — ED Provider Notes (Signed)
[facility]    CSN: [id number] Arrival date & time: 07/19/22  1641      History   Chief Complaint Chief Complaint  Patient presents with   Cough    HPI Clifford Kramer is a 56 y.o. male.   Patient complains of four day history of typical cold-like symptoms developing over several days, including sinus congestion, headache, fatigue, myalgias, and cough.  He has had persistent fever during this time interval and his cough has become worse during the past two days.  He denies pleuritic pain and shortness of breath.  Patient has RA, treated with Enbrel  The history is provided by the patient.    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/30/2012   Hyperlipidemia 07/30/2012   Gout 08/17/2010   Obstructive sleep apnea 12/07/2009   Hypersomnia with sleep 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   ERECTILE DYSFUNCTION 03/07/2007   Essential hypertension 03/07/2007   ALLERGIC RHINITIS 03/07/2007   Type 2 or unspecified type diabetes mellitus, uncontrolled 07/30/2006    Past Surgical History:  Procedure Laterality Date   COLONOSCOPY  2015   INSERTION OF MESH N/A 10/20/2021   Procedure: INSERTION OF MESH;  Surgeon: Jesusita Oka, MD;  Location: [facility];  Service: General;  Laterality: N/A;   KNEE SURGERY Left 2005   VENTRAL HERNIA REPAIR N/A 10/20/2021   Procedure: LAPAROSCOPIC VENTRAL HERNIA REPAIR;  Surgeon: Jesusita Oka, MD;   Location[facility];  Service: General;  Laterality: N/A;       Home Medications    Prior to Admission medications   Medication Sig Start Date End Date Taking? Authorizing Provider  doxycycline (VIBRAMYCIN) 100 MG capsule Take one cap PO Q12hr with food. 07/19/22  Yes Kandra Nicolas, MD  acetaminophen (TYLENOL) 500 MG tablet Take 2 tablets (1,000 mg total) by mouth every 6 (six) hours as needed. Patient not taking: Reported on 03/28/2022 10/20/21 10/20/22  Jesusita Oka, MD  allopurinol (ZYLOPRIM) 300 MG tablet TAKE 1 TABLET BY MOUTH EVERY DAY 05/22/22   [name], MD  amLODipine (NORVASC) 2.5 MG tablet Take 1 tablet (2.5 mg total) by mouth daily. 02/12/22   Elayne Snare, MD  aspirin 81 MG tablet Take 81 mg by mouth daily.    [provider]  BD PEN NEEDLE NANO U/F 32G X 4 MM MISC USE EVERY DAY 07/26/17   Elayne Snare, MD  Blood Glucose Monitoring Suppl (ACCU-CHEK GUIDE) w/Device KIT 1 each by Does not apply route 3 (three) times daily. Use accu chek guide device to check blood sugar twice daily. DX:E11.9 07/14/19   Elayne Snare, MD  ENBREL SURECLICK 50 MG/ML injection Inject 50 mg into the skin every Monday. 06/28/20   [provider]  fluticasone (FLONASE) 50 MCG/ACT nasal spray PLACE 2 SPRAYS INTO THE NOSE DAILY. Patient taking differently: Place 2 sprays into both nostrils  daily as needed for allergies. 06/21/20   [name], MD  glimepiride (AMARYL) 1 MG tablet TAKE 1 TABLET BY MOUTH EVERY DAY WITH BREAKFAST 05/02/22   Elayne Snare, MD  glucose blood (ONETOUCH VERIO) test strip Use to check blood sugar twice a day 02/28/22   Elayne Snare, MD  loratadine (CLARITIN) 10 MG tablet Take 1 tablet (10 mg total) by mouth daily. 03/28/22   [name], MD  losartan-hydrochlorothiazide (HYZAAR) 50-12.5 MG tablet Take 1 tablet by mouth daily. 06/20/22   [name], MD  omeprazole (PRILOSEC) 40 MG capsule TAKE 1 CAPSULE BY MOUTH EVERY DAY 06/11/22   [name],  MD  OneTouch Delica Lancets 63F MISC Check blood sugar 2 times daily 03/08/22   Elayne Snare, MD  OZEMPIC, 2 MG/DOSE, 8 MG/3ML SOPN INJECT 2 MG WEEKLY FOR DIABETES 04/13/22   Elayne Snare, MD  Semaglutide, 1 MG/DOSE, (OZEMPIC, 1 MG/DOSE,) 4 MG/3ML SOPN Inject 1 mg into the skin once a week as directed. 07/09/22   Elayne Snare, MD  Semaglutide, 1 MG/DOSE, 4 MG/3ML SOPN Inject 1 mg as directed once a week. 07/17/22   Elayne Snare, MD  Semaglutide, 2 MG/DOSE, (OZEMPIC, 2 MG/DOSE,) 8 MG/3ML SOPN Inject 2 mg weekly for diabetes 04/11/22   Elayne Snare, MD  simvastatin (ZOCOR) 20 MG tablet TAKE 1 TABLET BY MOUTH EVERYDAY AT BEDTIME Patient taking differently: Take 20 mg by mouth at bedtime. 06/26/21   [name], MD  SYNJARDY XR 12.11-998 MG TB24 TAKE 2 TABLETS BY MOUTH DAILY WITH BREAKFAST. 01/16/22   Elayne Snare, MD  Testosterone Undecanoate (JATENZO) 237 MG CAPS 1 capsule twice daily with food 07/01/22   Elayne Snare, MD  VIAGRA 100 MG tablet Take 100 mg by mouth daily as needed for erectile dysfunction. 04/01/14   [provider]    Family History Family History  Problem Relation Age of Onset   Diabetes Mother    Stomach cancer Other    Diabetes Maternal Aunt    Diabetes Maternal Grandmother    Heart disease Neg Hx    Colon cancer Neg Hx    Esophageal cancer Neg Hx    Rectal cancer Neg Hx     Social History Social History   Tobacco Use   Smoking status: Never   Smokeless tobacco: Never  Vaping Use   Vaping Use: Never used  Substance Use Topics   Alcohol use: Yes    Comment: rare beer- 3 beers per year   Drug use: Not Currently     Allergies   Celebrex [celecoxib], Penicillins, and Sulfonamide derivatives   Review of Systems Review of SystemsNo sore throat + cough No pleuritic pain No wheezing + nasal congestion + post-nasal drainage No sinus pain/pressure No itchy/red eyes No earache No hemoptysis No SOB + fever No nausea No vomiting No abdominal pain No  diarrhea No urinary symptoms No skin rash + fatigue + myalgias + headache(Tylenol flu, Delsym) without relief    Physical Exam Triage Vital Signs ED Triage Vitals  Enc Vitals Group     BP 07/19/22 1749 (!) 160/90     Pulse Rate 07/19/22 1749 (!) 115     Resp 07/19/22 1749 14     Temp 07/19/22 1749 100.2 F (37.9 C)     Temp Source 07/19/22 1749 Oral     SpO2 07/19/22 1749 95 %     Weight --      Height --      Head  Circumference --      Peak Flow --      Pain Score 07/19/22 1750 3     Pain Loc --      Pain Edu? --      Excl. in [place]? --    No data found.  Updated Vital Signs BP (!) 160/90 (BP Location: Right Arm)   Pulse (!) 115   Temp 100.2 F (37.9 C) (Oral)   Resp 14   SpO2 95%   Visual Acuity Right Eye Distance:   Left Eye Distance:   Bilateral Distance:    Right Eye Near:   Left Eye Near:    Bilateral Near:     Physical Exam Nursing notes and Vital Signs reviewed. Appearance:  Patient appears stated age, and in no acute distress Eyes:  Pupils are equal, round, and reactive to light and accomodation.  Extraocular movement is intact.  Conjunctivae are not inflamed  Ears:  Canals normal.  Tympanic membranes normal.  Nose:  Mildly congested turbinates.  No sinus tenderness.  Pharynx:  Normal Neck:  Supple.  Mildly enlarged lateral nodes are present, tender to palpation on the left.   Lungs:  Clear to auscultation.  Breath sounds are equal.  Moving air well. Heart:  Regular rate and rhythm without murmurs, rubs, or gallops.  Rate 115. Abdomen:  Nontender without masses or hepatosplenomegaly.  Bowel sounds are present.  No CVA or flank tenderness.  Extremities:  No edema.  Skin:  No rash present.   UC Treatments / Results  Labs (all labs ordered are listed, but only abnormal results are displayed) Labs Reviewed - No data to display  EKG   Radiology No results found.  Procedures Procedures (including critical care time)  Medications Ordered in  UC Medications - No data to display  Initial Impression / Assessment and Plan / UC Course  I have reviewed the triage vital signs and the nursing notes.  Pertinent labs & imaging results that were available during my care of the patient were reviewed by me and considered in my medical decision making (see chart for details).    Because of likely immune suppression from Enbrel for his RA, will begin doxycycline. Followup with Family Doctor if not improved in one week.   Final Clinical Impressions(s) / UC Diagnoses   Final diagnoses:  Acute bronchitis, unspecified organism     Discharge Instructions      Take plain guaifenesin (1211m extended release tabs such as Mucinex) twice daily, with plenty of water, for cough and congestion. Get adequate rest.   May continue Afrin nasal spray for about 5 days and then discontinue.  Also recommend using saline nasal spray several times daily and saline nasal irrigation (AYR is a common brand).  Use Flonase nasal spray each morning after using Afrin nasal spray and saline nasal irrigation. Try warm salt water gargles for sore throat.  Stop all antihistamines for now, and other non-prescription cough/cold preparations. May take Delsym Cough Suppressant ("12 Hour Cough Relief") at bedtime for nighttime cough.         ED Prescriptions     Medication Sig Dispense Auth. Provider   doxycycline (VIBRAMYCIN) 100 MG capsule Take one cap PO Q12hr with food. 14 capsule BKandra Nicolas MD         BKandra Nicolas MD 07/21/22 2032

## 2022-07-24 ENCOUNTER — Other Ambulatory Visit (HOSPITAL_COMMUNITY): Payer: Self-pay

## 2022-07-25 ENCOUNTER — Other Ambulatory Visit (HOSPITAL_COMMUNITY): Payer: Self-pay

## 2022-08-09 ENCOUNTER — Other Ambulatory Visit: Payer: Self-pay

## 2022-08-09 MED ORDER — SIMVASTATIN 20 MG PO TABS: 20.0000 mg | ORAL_TABLET | Freq: Every day | ORAL | 4 refills | Status: DC

## 2022-08-09 MED ORDER — ALLOPURINOL 300 MG PO TABS: 300.0000 mg | ORAL_TABLET | Freq: Every day | ORAL | 0 refills | Status: DC

## 2022-08-20 ENCOUNTER — Other Ambulatory Visit: Payer: Self-pay | Admitting: Family Medicine

## 2022-08-21 LAB — LAB REPORT - SCANNED: EGFR: 61

## 2022-09-02 ENCOUNTER — Encounter: Payer: Self-pay | Admitting: Endocrinology

## 2022-09-13 ENCOUNTER — Encounter: Payer: Self-pay | Admitting: Family Medicine

## 2022-09-19 ENCOUNTER — Other Ambulatory Visit: Payer: Self-pay | Admitting: Family Medicine

## 2022-09-30 ENCOUNTER — Other Ambulatory Visit: Payer: Self-pay | Admitting: Endocrinology

## 2022-09-30 DIAGNOSIS — E1165 Type 2 diabetes mellitus with hyperglycemia: Secondary | ICD-10-CM

## 2022-09-30 DIAGNOSIS — E291 Testicular hypofunction: Secondary | ICD-10-CM

## 2022-10-01 ENCOUNTER — Other Ambulatory Visit (INDEPENDENT_AMBULATORY_CARE_PROVIDER_SITE_OTHER): Payer: BC Managed Care – PPO

## 2022-10-01 DIAGNOSIS — E291 Testicular hypofunction: Secondary | ICD-10-CM | POA: Diagnosis not present

## 2022-10-01 DIAGNOSIS — E1165 Type 2 diabetes mellitus with hyperglycemia: Secondary | ICD-10-CM

## 2022-10-02 LAB — BASIC METABOLIC PANEL
BUN: 11 mg/dL (ref 6–23)
CO2: 26 mEq/L (ref 19–32)
Calcium: 9.5 mg/dL (ref 8.4–10.5)
Chloride: 103 mEq/L (ref 96–112)
Creatinine, Ser: 1.34 mg/dL (ref 0.40–1.50)
GFR: 58.33 mL/min — ABNORMAL LOW (ref >60.00)
Glucose, Bld: 95 mg/dL (ref 70–99)
Potassium: 3.9 mEq/L (ref 3.5–5.1)
Sodium: 139 mEq/L (ref 135–145)

## 2022-10-02 LAB — TESTOSTERONE: Testosterone: 710.28 ng/dL (ref 300.00–890.00)

## 2022-10-02 LAB — HEMOGLOBIN A1C: Hgb A1c MFr Bld: 7.3 % — ABNORMAL HIGH (ref 4.6–6.5)

## 2022-10-08 ENCOUNTER — Ambulatory Visit: Payer: BC Managed Care – PPO | Admitting: Endocrinology

## 2022-10-09 ENCOUNTER — Telehealth: Payer: Self-pay | Admitting: Family Medicine

## 2022-10-09 DIAGNOSIS — Z1211 Encounter for screening for malignant neoplasm of colon: Secondary | ICD-10-CM

## 2022-10-09 NOTE — Telephone Encounter (Signed)
Pt is requesting a referral for a colonoscopy.  He states he can't remember who did his last one.

## 2022-10-09 NOTE — Telephone Encounter (Signed)
Okay to refer? 

## 2022-10-12 IMAGING — DX DG CHEST 2V
2 series · 2 of 2 positions shown · non-contrast
Comparison: 08/01/2020

CLINICAL DATA: Cough congestion

EXAM:
CHEST - 2 VIEW

[chest pa]
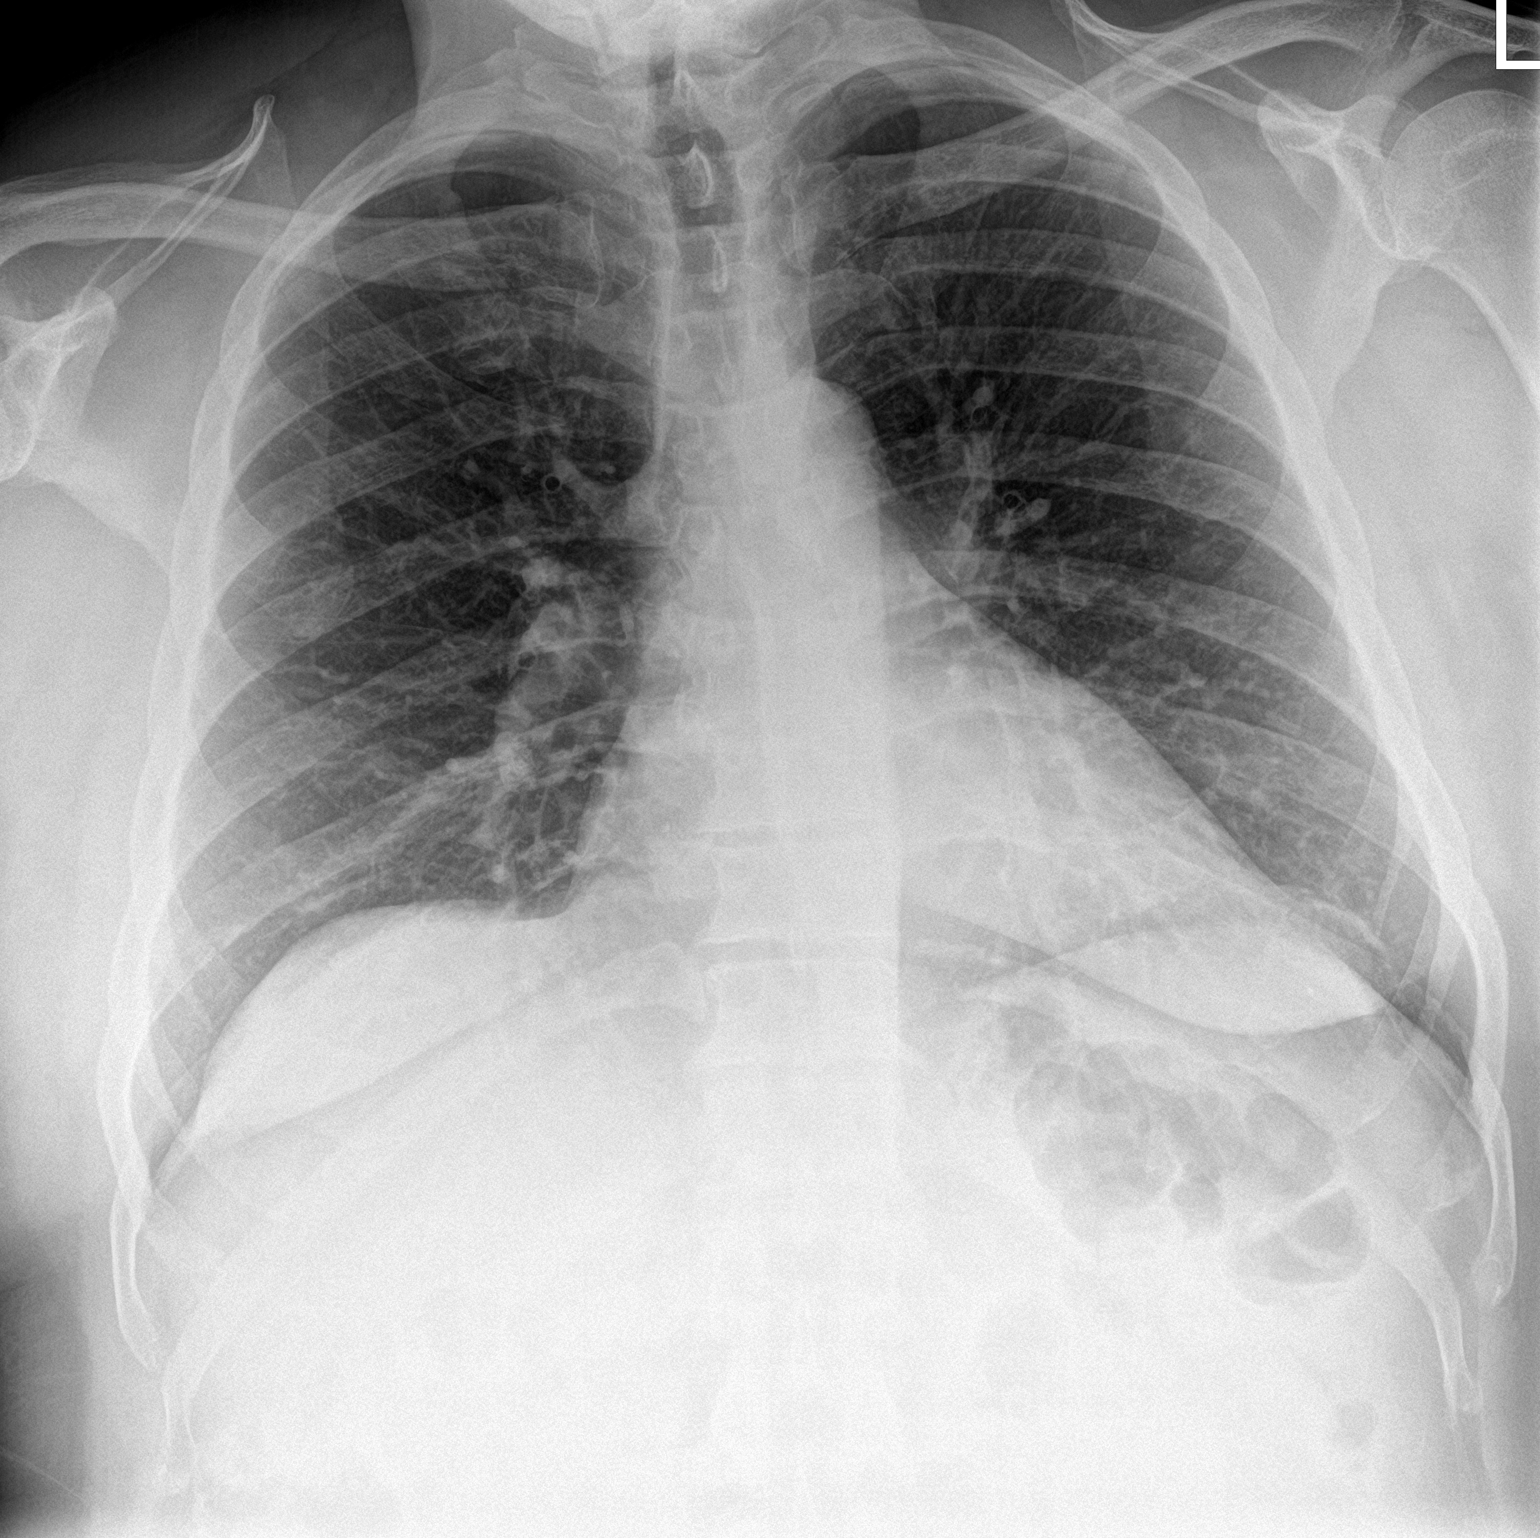

[chest lat]
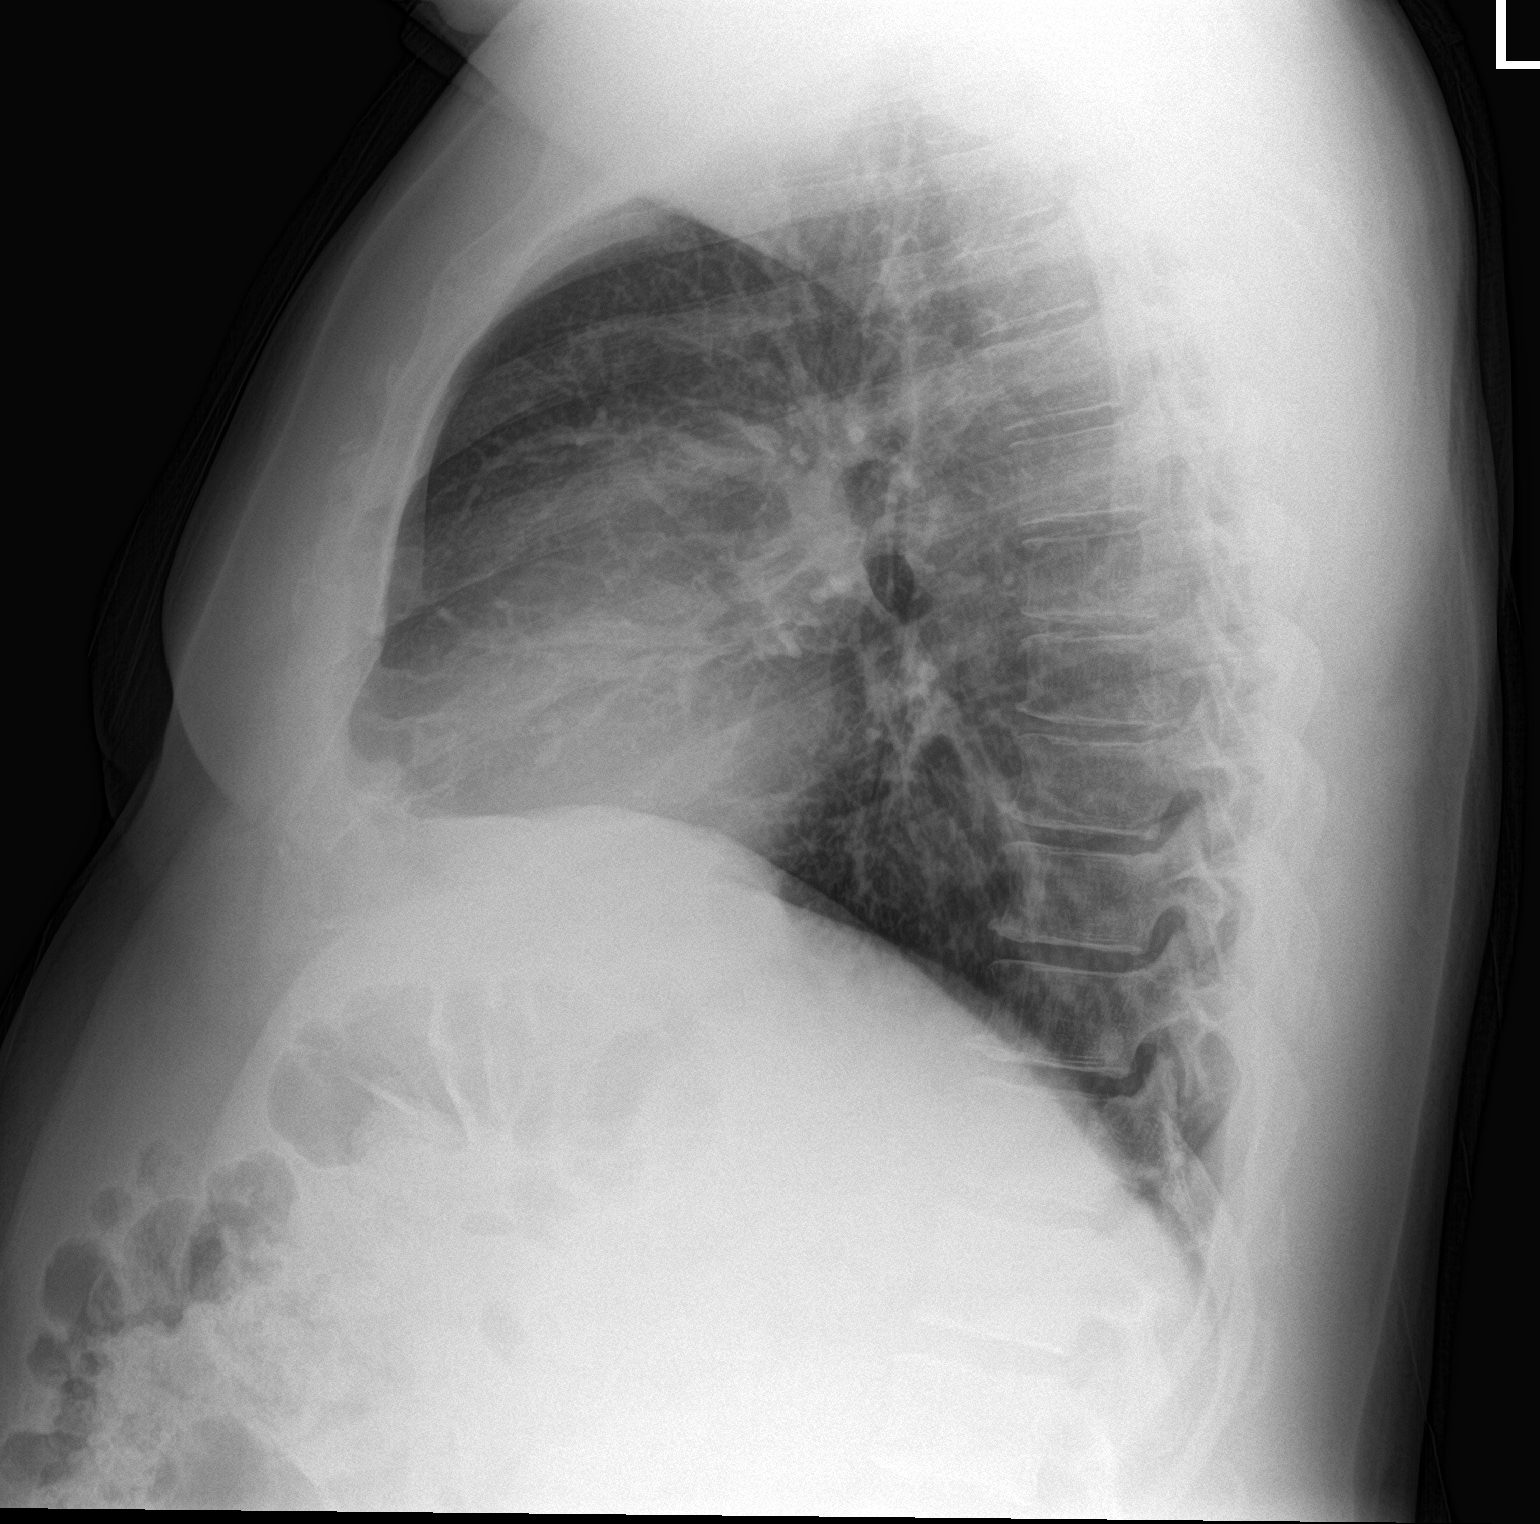

[2 of 2 positions shown; findings below may reference images not displayed]

FINDINGS: The heart size and mediastinal contours are within normal limits.
Both lungs are clear. The visualized skeletal structures are
unremarkable.
IMPRESSION: No active cardiopulmonary disease.

## 2022-10-19 NOTE — Telephone Encounter (Signed)
Ok for referral?

## 2022-10-19 NOTE — Telephone Encounter (Signed)
Pt is aware.  

## 2022-10-19 NOTE — Telephone Encounter (Signed)
A referral sent.

## 2022-10-20 ENCOUNTER — Other Ambulatory Visit: Payer: Self-pay | Admitting: Endocrinology

## 2022-10-20 DIAGNOSIS — E1165 Type 2 diabetes mellitus with hyperglycemia: Secondary | ICD-10-CM

## 2022-11-01 ENCOUNTER — Encounter: Payer: Self-pay | Admitting: Endocrinology

## 2022-11-01 ENCOUNTER — Ambulatory Visit (INDEPENDENT_AMBULATORY_CARE_PROVIDER_SITE_OTHER): Payer: BC Managed Care – PPO | Admitting: Endocrinology

## 2022-11-01 ENCOUNTER — Encounter: Payer: Self-pay | Admitting: Internal Medicine

## 2022-11-01 VITALS — BP 148/90 | HR 84 | Ht 71.0 in | Wt 270.0 lb

## 2022-11-01 DIAGNOSIS — E1165 Type 2 diabetes mellitus with hyperglycemia: Secondary | ICD-10-CM

## 2022-11-01 DIAGNOSIS — E291 Testicular hypofunction: Secondary | ICD-10-CM

## 2022-11-01 DIAGNOSIS — I10 Essential (primary) hypertension: Secondary | ICD-10-CM | POA: Diagnosis not present

## 2022-11-01 MED ORDER — JATENZO 237 MG PO CAPS: ORAL_CAPSULE | ORAL | 2 refills | Status: DC

## 2022-11-01 NOTE — Patient Instructions (Signed)
Check blood sugars on waking up 2-3 days a week  Also check blood sugars about 2 hours after meals and do this after different meals by rotation  Recommended blood sugar levels on waking up are 90-130 and about 2 hours after meal is 130-160  Please bring your blood sugar monitor to each visit, thank you   

## 2022-11-01 NOTE — Progress Notes (Signed)
Patient ID: Clifford Kramer, male   DOB: 1966/02/07, 57 y.o.   MRN: [id number]    Reason for Appointment : Endocrinology follow-up  History of Present Illness          Diagnosis: Type 2 diabetes mellitus, date of diagnosis: 2005     Background information: He was probably started on metformin at the time of diagnosis when his blood sugars were not very high About 5-6 years ago because of higher blood sugars he was also given glipizide  He appeared to have  progression of his diabetes with A1c going up to 8.1% in 01/2013; also had difficulty with losing weight before he was started on Victoza.  With this his blood sugars were significantly better His glipizide was reduced to 2.5 mg in 10/14 because of rare hypoglycemia    RECENT history:   Antihyperglycemic drugs : Ozempic 1 mg weekly, and Amaryl 1 mg 5 pm, Synjardy XR 12.11/998, 2 tablets daily at 8 am  Work routine: He starts work at [employer] AM for [employer]  Current management, blood sugar patterns and problems: His A1c is better at 7.3, previously was higher at 8% compared to 6.9 He has been able to get back on his Ozempic but only getting 1 mg available at the pharmacy so far Just had his 1 mg dose refilled last month for 73-month supply He still has not been able to lose weight that he had gained before As before he is checking his blood sugars very sporadically and has mildly increased fasting blood sugars No postprandial readings available He has been trying to exercise 4 times a week in the mornings more recently No hypoglycemic symptoms during the day with Amaryl   Side effects from medications have been: None        Monitors blood glucose: once a day or twice          Glucometer:      Accu-Chek  Blood Glucose readings from monitor review  FASTING range 125-141, afternoon 121 with only 5 readings  Previously:  PRE-MEAL Fasting Lunch Dinner Bedtime Overall  Glucose range: 160, 176      Mean/median:     143    POST-MEAL PC Breakfast PC Lunch PC Dinner  Glucose range: 153, 171  125, 163  Mean/median:        Diet: Meals: 3 meals per day. Dinner 5 pm, Bfst 9 am; lunch 2 pm;  eating out periodically  For breakfast:  eggs and toast or meat, sometimes fast food sandwiches Usually eating fruit and sandwich at lunch, usually a grilled meat at suppertime   Dietician visit: 05/2013           Wt Readings from Last 3 Encounters:  11/01/22 270 lb (122.5 kg)  06/29/22 268 lb (121.6 kg)  06/20/22 260 lb 6.4 oz (118.1 kg)   Lab Results  Component Value Date   HGBA1C 7.3 (H) 10/01/2022   HGBA1C 8.0 (H) 06/20/2022   HGBA1C 6.9 (H) 02/09/2022   Lab Results  Component Value Date   MICROALBUR <0.7 10/30/2021   [place] 60 06/20/2022   CREATININE 1.34 10/01/2022    Other active problems including new problems are in review of systems    No visits with results within 1 Week(s) from this visit.  Latest known visit with results is:  Lab on 10/01/2022  Component Date Value Ref Range Status   Hgb A1c MFr Bld 10/01/2022 7.3 (H)  4.6 - 6.5 % Final  Glycemic Control Guidelines for People with Diabetes:Non Diabetic:  <6%Goal of Therapy: <7%Additional Action Suggested:  >8%    Sodium 10/01/2022 139  135 - 145 mEq/L Final   Potassium 10/01/2022 3.9  3.5 - 5.1 mEq/L Final   Chloride 10/01/2022 103  96 - 112 mEq/L Final   CO2 10/01/2022 26  19 - 32 mEq/L Final   Glucose, Bld 10/01/2022 95  70 - 99 mg/dL Final   BUN 10/01/2022 11  6 - 23 mg/dL Final   Creatinine, Ser 10/01/2022 1.34  0.40 - 1.50 mg/dL Final   GFR 10/01/2022 58.33 (L)  >60.00 mL/min Final   Calculated using the CKD-EPI Creatinine Equation (2021)   Calcium 10/01/2022 9.5  8.4 - 10.5 mg/dL Final   Testosterone 10/01/2022 710.28  300.00 - 890.00 ng/dL Final    Allergies as of 11/01/2022       Reactions   Celebrex [celecoxib] Itching   Penicillins Itching   Sulfonamide Derivatives Itching        Medication List        Accurate  as of November 01, 2022  6:36 PM. If you have any questions, ask your nurse or doctor.          Accu-Chek Guide w/Device Kit 1 each by Does not apply route 3 (three) times daily. Use accu chek guide device to check blood sugar twice daily. DX:E11.9   allopurinol 300 MG tablet Commonly known as: ZYLOPRIM TAKE 1 TABLET BY MOUTH EVERY DAY   amLODipine 2.5 MG tablet Commonly known as: NORVASC Take 1 tablet (2.5 mg total) by mouth daily.   aspirin 81 MG tablet Take 81 mg by mouth daily.   BD Pen Needle Nano U/F 32G X 4 MM Misc Generic drug: Insulin Pen Needle USE EVERY DAY   doxycycline 100 MG capsule Commonly known as: VIBRAMYCIN Take one cap PO Q12hr with food.   Enbrel SureClick 50 MG/ML injection Generic drug: etanercept Inject 50 mg into the skin every Monday.   fluticasone 50 MCG/ACT nasal spray Commonly known as: FLONASE PLACE 2 SPRAYS INTO THE NOSE DAILY. What changed: See the new instructions.   glimepiride 1 MG tablet Commonly known as: AMARYL TAKE 1 TABLET BY MOUTH EVERY DAY WITH BREAKFAST   Jatenzo 237 MG Caps Generic drug: Testosterone Undecanoate 1 capsule twice daily with food   loratadine 10 MG tablet Commonly known as: CLARITIN Take 1 tablet (10 mg total) by mouth daily.   losartan-hydrochlorothiazide 50-12.5 MG tablet Commonly known as: HYZAAR Take 1 tablet by mouth daily.   omeprazole 40 MG capsule Commonly known as: PRILOSEC TAKE 1 CAPSULE BY MOUTH EVERY DAY   OneTouch Delica Lancets 99991111 Misc Check blood sugar 2 times daily   OneTouch Verio test strip Generic drug: glucose blood Use to check blood sugar twice a day   Ozempic (2 MG/DOSE) 8 MG/3ML Sopn Generic drug: Semaglutide (2 MG/DOSE) Inject 2 mg weekly for diabetes What changed: Another medication with the same name was removed. Continue taking this medication, and follow the directions you see here. Changed by: Elayne Snare, MD   Ozempic (2 MG/DOSE) 8 MG/3ML Sopn Generic drug:  Semaglutide (2 MG/DOSE) INJECT 2 MG WEEKLY FOR DIABETES What changed: Another medication with the same name was removed. Continue taking this medication, and follow the directions you see here. Changed by: Elayne Snare, MD   Semaglutide (1 MG/DOSE) 4 MG/3ML Sopn Inject 1 mg as directed once a week. What changed: Another medication with the same name was removed.  Continue taking this medication, and follow the directions you see here. Changed by: Elayne Snare, MD   simvastatin 20 MG tablet Commonly known as: ZOCOR Take 1 tablet (20 mg total) by mouth at bedtime.   Synjardy XR 12.11-998 MG Tb24 Generic drug: Empagliflozin-metFORMIN HCl ER TAKE 2 TABLETS BY MOUTH DAILY WITH BREAKFAST.   Viagra 100 MG tablet Generic drug: sildenafil Take 100 mg by mouth daily as needed for erectile dysfunction.        Allergies:  Allergies  Allergen Reactions   Celebrex [Celecoxib] Itching   Penicillins Itching   Sulfonamide Derivatives Itching    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   Gout    Hyperlipidemia    Hypertension    RA (rheumatoid arthritis)    Sleep apnea    CPAP use     Past Surgical History:  Procedure Laterality Date   COLONOSCOPY  2015   INSERTION OF MESH N/A 10/20/2021   Procedure: INSERTION OF MESH;  Surgeon: Jesusita Oka, MD;  Location: [place];  Service: General;  Laterality: N/A;   KNEE SURGERY Left 2005   VENTRAL HERNIA REPAIR N/A 10/20/2021   Procedure: LAPAROSCOPIC VENTRAL HERNIA REPAIR;  Surgeon: Jesusita Oka, MD;  Location: [place];  Service: General;  Laterality: N/A;    Family History  Problem Relation Age of Onset   Diabetes Mother    Stomach cancer Other    Diabetes Maternal Aunt    Diabetes Maternal Grandmother    Heart disease Neg Hx    Colon cancer Neg Hx    Esophageal cancer Neg Hx    Rectal cancer Neg Hx     Social History:  reports that he has never smoked. He  has never used smokeless tobacco. He reports current alcohol use. He reports that he does not currently use drugs.    Review of Systems    HYPERTENSION: This is usually controlled with Norvasc 2.5 mg and  20 mg lisinopril HCTZ Blood pressure is again high today He has checked at home only 1-2x per month and does not remember his readings but does not think they are as high  Has not taken his medication this morning  BP Readings from Last 3 Encounters:  11/01/22 (!) 148/90  07/19/22 (!) 160/90  06/29/22 (!) 142/84   Creatinine relatively better     Lab Results  Component Value Date   CREATININE 1.34 10/01/2022   CREATININE 1.40 06/20/2022   CREATININE 1.28 (H) 02/09/2022     GOUT: He has had history of recurrent episodes of probable gout in various joints including knee and elbow in the past Baseline uric acid was 8.4 done in 2012  Taking allopurinol 300 mg daily regularly with last uric acid 3.3  He is taking Enbrel for his rheumatoid arthritis from rheumatologist     Hyperlipidemia:   LDL has been excellent, baseline previously 109, has been prescribed simvastatin 20 mg by his PCP. Has a low HDL  Lab Results  Component Value Date   CHOL 115 06/20/2022   HDL 31.70 (L) 06/20/2022   LDLCALC 60 06/20/2022   TRIG 116.0 06/20/2022   CHOLHDL 4 06/20/2022       HYPOGONADISM:  He has been on testosterone supplements since 2011 when his level was mildly low around 300  but no further evaluation done.   He has a relatively low testosterone level while using Axiron  and had difficulty with the application With Androderm patches he had improved testosterone levels and less fatigue  His testosterone level had been inconsistent with using 8 mg Androderm and was taking the 6 mg total dose  He was getting more skin irritation and sensitivity to the Androderm, also had nasal irritation/bad taste with Natesto Not with taking Jatenzo 2 37 mg twice a day with food he thinks he  feels a little better after the dosage increase last time and has more motivation  However testosterone was increased from 260 up to 710, normal >300   Lab Results  Component Value Date   TESTOSTERONE 710.28 10/01/2022   Lab Results  Component Value Date   HGB 14.1 06/20/2022     He takes Viagra/ Cialis for erectile dysfunction, also was given Cialis by the [facility] with only intermittent benefit  EYE exams: These are done in January annually at the [facility] and no reports available   LABS:  No visits with results within 1 Week(s) from this visit.  Latest known visit with results is:  Lab on 10/01/2022  Component Date Value Ref Range Status   Hgb A1c MFr Bld 10/01/2022 7.3 (H)  4.6 - 6.5 % Final   Glycemic Control Guidelines for People with Diabetes:Non Diabetic:  <6%Goal of Therapy: <7%Additional Action Suggested:  >8%    Sodium 10/01/2022 139  135 - 145 mEq/L Final   Potassium 10/01/2022 3.9  3.5 - 5.1 mEq/L Final   Chloride 10/01/2022 103  96 - 112 mEq/L Final   CO2 10/01/2022 26  19 - 32 mEq/L Final   Glucose, Bld 10/01/2022 95  70 - 99 mg/dL Final   BUN 10/01/2022 11  6 - 23 mg/dL Final   Creatinine, Ser 10/01/2022 1.34  0.40 - 1.50 mg/dL Final   GFR 10/01/2022 58.33 (L)  >60.00 mL/min Final   Calculated using the CKD-EPI Creatinine Equation (2021)   Calcium 10/01/2022 9.5  8.4 - 10.5 mg/dL Final   Testosterone 10/01/2022 710.28  300.00 - 890.00 ng/dL Final     Physical Examination:   BP (!) 148/90   Pulse 84   Ht 5\' 11"  (1.803 m)   Wt 270 lb (122.5 kg)   SpO2 97%   BMI 37.66 kg/m         ASSESSMENT/PLAN:   Diabetes type 2, with obesity  See history of present illness for detailed discussion of his current management, blood sugar patterns and problems identified  His A1c is 7.3 and improved   He is taking Synjardy, Ozempic 1 mg daily and Amaryl 1 mg daily  Blood sugars are are somewhat better with going back on Ozempic but did  better with the 2 mg dose Only for sometimes has been regular with his exercise Blood sugars are mildly increased at home but checked infrequently Monitoring at bedtime Also before he finishes his 1 mg supply he will let us know and we will increase the dose to 2 mg  HYPERTENSION:  His blood pressure is not well controlled but not clear if blood pressure is consistently high   He will monitor more regularly at home and follow-up with PCP if consistently higher   Hypogonadism:   He is on twice a day Jatenzo 237 mg and feels better now The dose was increased in August May have improved motivation and less fatigue  Although his testosterone level is upper normal at 710 subjectively doing better Will continue to monitor and also check hemoglobin on the  next visit  Patient Instructions  Check blood sugars on waking up  2-3 days a week  Also check blood sugars about 2 hours after meals and do this after different meals by rotation  Recommended blood sugar levels on waking up are 90-130 and about 2 hours after meal is 130-160  Please bring your blood sugar monitor to each visit, thank you      Elayne Snare 11/01/2022, 6:36 PM

## 2022-11-14 ENCOUNTER — Encounter: Payer: Self-pay | Admitting: Endocrinology

## 2022-11-14 DIAGNOSIS — I10 Essential (primary) hypertension: Secondary | ICD-10-CM

## 2022-11-15 MED ORDER — AMLODIPINE BESYLATE 2.5 MG PO TABS
2.5000 mg | ORAL_TABLET | Freq: Every day | ORAL | 3 refills | Status: DC
Start: 2022-11-15 — End: 2023-09-25

## 2022-11-25 ENCOUNTER — Other Ambulatory Visit: Payer: Self-pay | Admitting: Family Medicine

## 2022-12-09 ENCOUNTER — Encounter: Payer: Self-pay | Admitting: Family Medicine

## 2022-12-09 ENCOUNTER — Encounter: Payer: Self-pay | Admitting: Endocrinology

## 2022-12-10 MED ORDER — GLIMEPIRIDE 1 MG PO TABS: ORAL_TABLET | ORAL | 3 refills | Status: DC

## 2022-12-10 MED ORDER — OMEPRAZOLE 40 MG PO CPDR: 40.0000 mg | DELAYED_RELEASE_CAPSULE | Freq: Every day | ORAL | 1 refills | Status: DC

## 2022-12-11 ENCOUNTER — Encounter: Payer: Self-pay | Admitting: Family Medicine

## 2022-12-11 DIAGNOSIS — I10 Essential (primary) hypertension: Secondary | ICD-10-CM

## 2022-12-11 MED ORDER — LOSARTAN POTASSIUM-HCTZ 50-12.5 MG PO TABS
1.0000 | ORAL_TABLET | Freq: Every day | ORAL | 1 refills | Status: DC
Start: 2022-12-11 — End: 2023-02-13

## 2022-12-25 ENCOUNTER — Telehealth: Payer: Self-pay

## 2022-12-25 NOTE — Telephone Encounter (Signed)
Called twice and left message regarding Pre visit No Show.  Let him know he needed to call back and reschedule pre visit by 5pm or his colonoscopy on 7/15 would be cancelled

## 2022-12-25 NOTE — Telephone Encounter (Signed)
PV not rescheduled so pre visit and colonoscopy cancelled and letter sent.

## 2022-12-26 ENCOUNTER — Encounter: Payer: Self-pay | Admitting: Internal Medicine

## 2022-12-29 ENCOUNTER — Encounter: Payer: Self-pay | Admitting: Endocrinology

## 2022-12-29 ENCOUNTER — Encounter: Payer: Self-pay | Admitting: Family Medicine

## 2022-12-31 ENCOUNTER — Other Ambulatory Visit: Payer: Self-pay

## 2022-12-31 DIAGNOSIS — E1165 Type 2 diabetes mellitus with hyperglycemia: Secondary | ICD-10-CM

## 2022-12-31 MED ORDER — OZEMPIC (2 MG/DOSE) 8 MG/3ML ~~LOC~~ SOPN
PEN_INJECTOR | SUBCUTANEOUS | 0 refills | Status: DC
Start: 2022-12-31 — End: 2023-02-04

## 2023-01-07 ENCOUNTER — Encounter: Payer: BC Managed Care – PPO | Admitting: Internal Medicine

## 2023-01-08 ENCOUNTER — Ambulatory Visit (AMBULATORY_SURGERY_CENTER): Payer: BC Managed Care – PPO

## 2023-01-08 VITALS — Ht 71.0 in | Wt 260.0 lb

## 2023-01-08 DIAGNOSIS — Z1211 Encounter for screening for malignant neoplasm of colon: Secondary | ICD-10-CM

## 2023-01-08 NOTE — Progress Notes (Signed)

## 2023-01-14 ENCOUNTER — Encounter: Payer: BC Managed Care – PPO | Admitting: Internal Medicine

## 2023-01-25 ENCOUNTER — Other Ambulatory Visit (INDEPENDENT_AMBULATORY_CARE_PROVIDER_SITE_OTHER): Payer: BC Managed Care – PPO

## 2023-01-25 DIAGNOSIS — I10 Essential (primary) hypertension: Secondary | ICD-10-CM

## 2023-01-25 DIAGNOSIS — E291 Testicular hypofunction: Secondary | ICD-10-CM

## 2023-01-25 DIAGNOSIS — E1165 Type 2 diabetes mellitus with hyperglycemia: Secondary | ICD-10-CM | POA: Diagnosis not present

## 2023-01-25 LAB — MICROALBUMIN / CREATININE URINE RATIO
Creatinine,U: 91.9 mg/dL
Microalb Creat Ratio: 0.8 mg/g (ref 0.0–30.0)
Microalb, Ur: <0.7 mg/dL (ref 0.0–1.9)

## 2023-01-25 LAB — CBC
HCT: 39.3 % (ref 39.0–52.0)
Hemoglobin: 12.2 g/dL — ABNORMAL LOW (ref 13.0–17.0)
MCHC: 31.2 g/dL (ref 30.0–36.0)
MCV: 64.3 fl — ABNORMAL LOW (ref 78.0–100.0)
Platelets: 253 10*3/uL (ref 150.0–400.0)
RBC: 6.11 Mil/uL — ABNORMAL HIGH (ref 4.22–5.81)
RDW: 18.8 % — ABNORMAL HIGH (ref 11.5–15.5)
WBC: 4.9 10*3/uL (ref 4.0–10.5)

## 2023-01-25 LAB — HEMOGLOBIN A1C: Hgb A1c MFr Bld: 7.8 % — ABNORMAL HIGH (ref 4.6–6.5)

## 2023-01-25 LAB — COMPREHENSIVE METABOLIC PANEL
ALT: 18 U/L (ref 0–53)
AST: 18 U/L (ref 0–37)
Albumin: 4.2 g/dL (ref 3.5–5.2)
Alkaline Phosphatase: 66 U/L (ref 39–117)
BUN: 11 mg/dL (ref 6–23)
CO2: 27 mEq/L (ref 19–32)
Calcium: 9.3 mg/dL (ref 8.4–10.5)
Chloride: 103 mEq/L (ref 96–112)
Creatinine, Ser: 1.4 mg/dL (ref 0.40–1.50)
GFR: 55.22 mL/min — ABNORMAL LOW (ref >60.00)
Glucose, Bld: 131 mg/dL — ABNORMAL HIGH (ref 70–99)
Potassium: 3.9 mEq/L (ref 3.5–5.1)
Sodium: 138 mEq/L (ref 135–145)
Total Bilirubin: 0.4 mg/dL (ref 0.2–1.2)
Total Protein: 6.9 g/dL (ref 6.0–8.3)

## 2023-01-25 LAB — LIPID PANEL
Cholesterol: 77 mg/dL (ref 0–200)
HDL: 27.7 mg/dL — ABNORMAL LOW (ref >39.00)
LDL Cholesterol: 38 mg/dL (ref 0–99)
NonHDL: 49
Total CHOL/HDL Ratio: 3
Triglycerides: 56 mg/dL (ref 0.0–149.0)
VLDL: 11.2 mg/dL (ref 0.0–40.0)

## 2023-01-25 LAB — TESTOSTERONE: Testosterone: 505.48 ng/dL (ref 300.00–890.00)

## 2023-01-26 ENCOUNTER — Other Ambulatory Visit: Payer: Self-pay | Admitting: Endocrinology

## 2023-01-26 ENCOUNTER — Other Ambulatory Visit: Payer: Self-pay | Admitting: Family Medicine

## 2023-01-26 DIAGNOSIS — I10 Essential (primary) hypertension: Secondary | ICD-10-CM

## 2023-01-30 ENCOUNTER — Encounter: Payer: Self-pay | Admitting: Endocrinology

## 2023-01-30 DIAGNOSIS — E1165 Type 2 diabetes mellitus with hyperglycemia: Secondary | ICD-10-CM

## 2023-01-30 MED ORDER — SYNJARDY XR 12.5-1000 MG PO TB24: 2.0000 | ORAL_TABLET | Freq: Every day | ORAL | 2 refills | Status: DC

## 2023-01-30 NOTE — Telephone Encounter (Signed)
Medication resent to [facility] per pt advice request.   Clifford Kramer

## 2023-01-30 NOTE — Addendum Note (Signed)
Addended by: Pollie Meyer on: 01/30/2023 02:40 PM   Modules accepted: Orders

## 2023-02-04 MED ORDER — OZEMPIC (2 MG/DOSE) 8 MG/3ML ~~LOC~~ SOPN
PEN_INJECTOR | SUBCUTANEOUS | 2 refills | Status: DC
Start: 2023-02-04 — End: 2023-02-25

## 2023-02-05 ENCOUNTER — Encounter: Payer: Self-pay | Admitting: Endocrinology

## 2023-02-05 ENCOUNTER — Ambulatory Visit (INDEPENDENT_AMBULATORY_CARE_PROVIDER_SITE_OTHER): Payer: BC Managed Care – PPO | Admitting: Endocrinology

## 2023-02-05 VITALS — BP 124/75 | HR 95 | Ht 70.0 in | Wt 260.6 lb

## 2023-02-05 DIAGNOSIS — D509 Iron deficiency anemia, unspecified: Secondary | ICD-10-CM | POA: Diagnosis not present

## 2023-02-05 DIAGNOSIS — E291 Testicular hypofunction: Secondary | ICD-10-CM | POA: Diagnosis not present

## 2023-02-05 DIAGNOSIS — E1165 Type 2 diabetes mellitus with hyperglycemia: Secondary | ICD-10-CM | POA: Diagnosis not present

## 2023-02-05 NOTE — Progress Notes (Signed)
Patient ID: Clifford Kramer, male   DOB: [birth year], 57 y.o.   MRN: [id number]    Reason for Appointment : Endocrinology follow-up  History of Present Illness          Diagnosis: Type 2 diabetes mellitus, date of diagnosis: 2005     Background information: He was probably started on metformin at the time of diagnosis when his blood sugars were not very high About 5-6 years ago because of higher blood sugars he was also given glipizide  He appeared to have  progression of his diabetes with A1c going up to 8.1% in 01/2013; also had difficulty with losing weight before he was started on Victoza.  With this his blood sugars were significantly better His glipizide was reduced to 2.5 mg in 10/14 because of rare hypoglycemia    RECENT history:   Antihyperglycemic drugs : Ozempic 2  mg weekly, and Amaryl 1 mg 5 pm, Synjardy XR 12.11/998, 2 tablets daily at 8 am  Work routine: He starts work at [employer] AM for [employer]  Current management, blood sugar patterns and problems: His A1c is 7.8, previously was at 7.3 Since about his last visit in April he has been able to get the 2 mg Ozempic No nausea or other GI side effects with this He is taking this regularly However he says he has not been able to exercise until recently because of some leg issues However his blood sugars have been checked very sporadically and only on 3 days this month and difficult to know whether his blood sugars are improving Also unclear what his readings are fasting Lab glucose in the afternoon was 131 Lowest blood sugar 98 during the day with taking Amaryl at dinnertime He is generally trying to improve diet with cutting back on fried food and rice Weight is down 10 pounds since April which is an improvement   Side effects from medications have been: None        Monitors blood glucose: once a day or twice          Glucometer:      Accu-Chek  Blood Glucose readings from monitor review  Mornings 182, 195, likely  after eating, nonfasting recently 98-206   Previous morning range 125-141, afternoon 121 with only 5 readings    Diet: Meals: 3 meals per day. Dinner 5 pm, Bfst 9 am; lunch 2 pm;  eating out periodically  For breakfast:  eggs and toast or meat, sometimes fast food sandwiches Usually eating fruit and sandwich at lunch, usually a grilled meat at suppertime   Dietician visit: 05/2013           Wt Readings from Last 3 Encounters:  02/05/23 260 lb 9.6 oz (118.2 kg)  01/08/23 260 lb (117.9 kg)  11/01/22 270 lb (122.5 kg)   Lab Results  Component Value Date   HGBA1C 7.8 (H) 01/25/2023   HGBA1C 7.3 (H) 10/01/2022   HGBA1C 8.0 (H) 06/20/2022   Lab Results  Component Value Date   MICROALBUR <0.7 01/25/2023   LDLCALC 38 01/25/2023   CREATININE 1.40 01/25/2023    Other active problems including new problems are in review of systems    No visits with results within 1 Week(s) from this visit.  Latest known visit with results is:  Lab on 01/25/2023  Component Date Value Ref Range Status   WBC 01/25/2023 4.9  4.0 - 10.5 K/uL Final   RBC 01/25/2023 6.11 (H)  4.22 - 5.81 Mil/uL Final  Platelets 01/25/2023 253.0  150.0 - 400.0 K/uL Final   Hemoglobin 01/25/2023 12.2 (L)  13.0 - 17.0 g/dL Final   HCT 16/04/9603 39.3  39.0 - 52.0 % Final   MCV 01/25/2023 64.3 (L)  78.0 - 100.0 fl Corrected   Rechecked and verified result.   MCHC 01/25/2023 31.2  30.0 - 36.0 g/dL Final   RDW 54/03/8118 18.8 (H)  11.5 - 15.5 % Final   Hgb A1c MFr Bld 01/25/2023 7.8 (H)  4.6 - 6.5 % Final   Glycemic Control Guidelines for People with Diabetes:Non Diabetic:  <6%Goal of Therapy: <7%Additional Action Suggested:  >8%    Sodium 01/25/2023 138  135 - 145 mEq/L Final   Potassium 01/25/2023 3.9  3.5 - 5.1 mEq/L Final   Chloride 01/25/2023 103  96 - 112 mEq/L Final   CO2 01/25/2023 27  19 - 32 mEq/L Final   Glucose, Bld 01/25/2023 131 (H)  70 - 99 mg/dL Final   BUN 14/78/2956 11  6 - 23 mg/dL Final    Creatinine, Ser 01/25/2023 1.40  0.40 - 1.50 mg/dL Final   Total Bilirubin 01/25/2023 0.4  0.2 - 1.2 mg/dL Final   Alkaline Phosphatase 01/25/2023 66  39 - 117 U/L Final   AST 01/25/2023 18  0 - 37 U/L Final   ALT 01/25/2023 18  0 - 53 U/L Final   Total Protein 01/25/2023 6.9  6.0 - 8.3 g/dL Final   Albumin 21/30/8657 4.2  3.5 - 5.2 g/dL Final   GFR 84/69/6295 55.22 (L)  >60.00 mL/min Final   Calculated using the CKD-EPI Creatinine Equation (2021)   Calcium 01/25/2023 9.3  8.4 - 10.5 mg/dL Final   Microalb, Ur 28/41/3244 <0.7  0.0 - 1.9 mg/dL Final   Creatinine,U 08/01/7251 91.9  mg/dL Final   Microalb Creat Ratio 01/25/2023 0.8  0.0 - 30.0 mg/g Final   Cholesterol 01/25/2023 77  0 - 200 mg/dL Final   ATP III Classification       Desirable:  < 200 mg/dL               Borderline High:  200 - 239 mg/dL          High:  > = 664 mg/dL   Triglycerides 40/34/7425 56.0  0.0 - 149.0 mg/dL Final   Normal:  <956 mg/dLBorderline High:  150 - 199 mg/dL   HDL 38/75/6433 29.51 (L)  >88.41 mg/dL Final   VLDL 66/12/3014 11.2  0.0 - 40.0 mg/dL Final   LDL Cholesterol 01/25/2023 38  0 - 99 mg/dL Final   Total CHOL/HDL Ratio 01/25/2023 3   Final                  Men          Women1/2 Average Risk     3.4          3.3Average Risk          5.0          4.42X Average Risk          9.6          7.13X Average Risk          15.0          11.0                       NonHDL 01/25/2023 49.00   Final   NOTE:  Non-HDL goal should be 30 mg/dL higher than  patient's LDL goal (i.e. LDL goal of < 70 mg/dL, would have non-HDL goal of < 100 mg/dL)   Testosterone 24/40/1027 505.48  300.00 - 890.00 ng/dL Final    Allergies as of 02/05/2023       Reactions   Celebrex [celecoxib] Itching   Penicillins Itching   Sulfonamide Derivatives Itching        Medication List        Accurate as of February 05, 2023  9:32 AM. If you have any questions, ask your nurse or doctor.          STOP taking these medications    metFORMIN  1000 MG tablet Commonly known as: GLUCOPHAGE Stopped by: Reather Littler, MD   testosterone 4 MG/24HR Pt24 patch Commonly known as: ANDRODERM Stopped by: Reather Littler, MD       TAKE these medications    Accu-Chek Guide w/Device Kit 1 each by Does not apply route 3 (three) times daily. Use accu chek guide device to check blood sugar twice daily. DX:E11.9   allopurinol 300 MG tablet Commonly known as: ZYLOPRIM TAKE 1 TABLET BY MOUTH EVERY DAY   amLODipine 2.5 MG tablet Commonly known as: NORVASC Take 1 tablet (2.5 mg total) by mouth daily.   aspirin 81 MG tablet Take 81 mg by mouth daily.   BD Pen Needle Nano U/F 32G X 4 MM Misc Generic drug: Insulin Pen Needle USE EVERY DAY   doxycycline 100 MG capsule Commonly known as: VIBRAMYCIN Take one cap PO Q12hr with food.   Enbrel SureClick 50 MG/ML injection Generic drug: etanercept Inject 50 mg into the skin every Monday.   fluticasone 50 MCG/ACT nasal spray Commonly known as: FLONASE PLACE 2 SPRAYS INTO THE NOSE DAILY. What changed: See the new instructions.   glimepiride 1 MG tablet Commonly known as: AMARYL TAKE 1 TABLET BY MOUTH EVERY DAY WITH BREAKFAST   hydroxychloroquine 200 MG tablet Commonly known as: PLAQUENIL Take 200 mg by mouth 2 (two) times daily.   Jatenzo 237 MG Caps Generic drug: Testosterone Undecanoate 1 capsule twice daily with food   loratadine 10 MG tablet Commonly known as: CLARITIN Take 1 tablet (10 mg total) by mouth daily.   losartan-hydrochlorothiazide 50-12.5 MG tablet Commonly known as: HYZAAR Take 1 tablet by mouth daily.   omeprazole 40 MG capsule Commonly known as: PRILOSEC Take 1 capsule (40 mg total) by mouth daily.   OneTouch Delica Lancets 33G Misc Check blood sugar 2 times daily   OneTouch Verio test strip Generic drug: glucose blood Use to check blood sugar twice a day   Ozempic (2 MG/DOSE) 8 MG/3ML Sopn Generic drug: Semaglutide (2 MG/DOSE) INJECT 2 MG WEEKLY FOR  DIABETES What changed: Another medication with the same name was removed. Continue taking this medication, and follow the directions you see here. Changed by: Reather Littler, MD   Ozempic (2 MG/DOSE) 8 MG/3ML Sopn Generic drug: Semaglutide (2 MG/DOSE) Inject 2 mg weekly for diabetes What changed: Another medication with the same name was removed. Continue taking this medication, and follow the directions you see here. Changed by: Reather Littler, MD   simvastatin 20 MG tablet Commonly known as: ZOCOR Take 1 tablet (20 mg total) by mouth at bedtime.   Synjardy XR 12.11-998 MG Tb24 Generic drug: Empagliflozin-metFORMIN HCl ER Take 2 tablets by mouth daily with breakfast.   vardenafil 20 MG tablet Commonly known as: LEVITRA Take by mouth.   Viagra 100 MG tablet Generic drug: sildenafil Take 100 mg by  mouth daily as needed for erectile dysfunction.        Allergies:  Allergies  Allergen Reactions   Celebrex [Celecoxib] Itching   Penicillins Itching   Sulfonamide Derivatives Itching    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   GERD (gastroesophageal reflux disease)    Gout    Hyperlipidemia    Hypertension    RA (rheumatoid arthritis) (HCC)    Sleep apnea    CPAP use     Past Surgical History:  Procedure Laterality Date   COLONOSCOPY  2015   INSERTION OF MESH N/A 10/20/2021   Procedure: INSERTION OF MESH;  Surgeon: Diamantina Monks, MD;  Location: [facility] OR;  Service: General;  Laterality: N/A;   KNEE SURGERY Left 2005   VENTRAL HERNIA REPAIR N/A 10/20/2021   Procedure: LAPAROSCOPIC VENTRAL HERNIA REPAIR;  Surgeon: Diamantina Monks, MD;  Location: [facility] OR;  Service: General;  Laterality: N/A;    Family History  Problem Relation Age of Onset   Diabetes Mother    Diabetes Maternal Aunt    Diabetes Maternal Grandmother    Stomach cancer Other    Heart disease Neg Hx    Colon cancer Neg Hx     Esophageal cancer Neg Hx    Rectal cancer Neg Hx    Colon polyps Neg Hx     Social History:  reports that he has never smoked. He has never used smokeless tobacco. He reports current alcohol use. He reports that he does not currently use drugs.    Review of Systems    HYPERTENSION: This is usually controlled with Norvasc 2.5 mg and  20 mg lisinopril HCTZ Blood pressure is again high today He has checked at home only 1-2x per month and does not remember his readings but does not think they are as high  Has not taken his medication this morning  BP Readings from Last 3 Encounters:  02/05/23 124/75  11/01/22 (!) 148/90  07/19/22 (!) 160/90   Creatinine relatively better     Lab Results  Component Value Date   CREATININE 1.40 01/25/2023   CREATININE 1.34 10/01/2022   CREATININE 1.40 06/20/2022     GOUT: He has had history of recurrent episodes of probable gout in various joints including knee and elbow in the past Baseline uric acid was 8.4 done in 2012  Taking allopurinol 300 mg daily regularly with last uric acid 3.3  He is taking Enbrel for his rheumatoid arthritis from rheumatologist     Hyperlipidemia:   LDL has been excellent, baseline previously 109, has been prescribed simvastatin 20 mg by his PCP. Has a low HDL  Lab Results  Component Value Date   CHOL 77 01/25/2023   HDL 27.70 (L) 01/25/2023   LDLCALC 38 01/25/2023   TRIG 56.0 01/25/2023   CHOLHDL 3 01/25/2023       HYPOGONADISM:  He has been on testosterone supplements since 2011 when his level was mildly low around 300  but no further evaluation done.   He has a relatively low testosterone level while using Axiron and had difficulty with the application With Androderm patches he had improved testosterone levels and less fatigue His testosterone level had been inconsistent with using 8 mg Androderm and was taking the 6 mg total dose He was getting more skin irritation and sensitivity to the  Androderm, also had nasal irritation/bad taste with [employer]  This year he has been switched to taking Jatenzo 2 37 mg twice a day with food  With the higher dose he has consistently good energy level  Although previously testosterone was increased up to 710 it is better now at 545 Although his RBC count is up his hemoglobin is low   Lab Results  Component Value Date   TESTOSTERONE 505.48 01/25/2023   Lab Results  Component Value Date   HGB 12.2 (L) 01/25/2023     He takes Viagra/ Cialis for erectile dysfunction, also was given Cialis by the [facility] with only intermittent benefit  EYE exams: These are done in January annually at the [facility] and no reports available   LABS:  No visits with results within 1 Week(s) from this visit.  Latest known visit with results is:  Lab on 01/25/2023  Component Date Value Ref Range Status   WBC 01/25/2023 4.9  4.0 - 10.5 K/uL Final   RBC 01/25/2023 6.11 (H)  4.22 - 5.81 Mil/uL Final   Platelets 01/25/2023 253.0  150.0 - 400.0 K/uL Final   Hemoglobin 01/25/2023 12.2 (L)  13.0 - 17.0 g/dL Final   HCT 16/04/9603 39.3  39.0 - 52.0 % Final   MCV 01/25/2023 64.3 (L)  78.0 - 100.0 fl Corrected   Rechecked and verified result.   MCHC 01/25/2023 31.2  30.0 - 36.0 g/dL Final   RDW 54/03/8118 18.8 (H)  11.5 - 15.5 % Final   Hgb A1c MFr Bld 01/25/2023 7.8 (H)  4.6 - 6.5 % Final   Glycemic Control Guidelines for People with Diabetes:Non Diabetic:  <6%Goal of Therapy: <7%Additional Action Suggested:  >8%    Sodium 01/25/2023 138  135 - 145 mEq/L Final   Potassium 01/25/2023 3.9  3.5 - 5.1 mEq/L Final   Chloride 01/25/2023 103  96 - 112 mEq/L Final   CO2 01/25/2023 27  19 - 32 mEq/L Final   Glucose, Bld 01/25/2023 131 (H)  70 - 99 mg/dL Final   BUN 14/78/2956 11  6 - 23 mg/dL Final   Creatinine, Ser 01/25/2023 1.40  0.40 - 1.50 mg/dL Final   Total Bilirubin 01/25/2023 0.4  0.2 - 1.2 mg/dL Final   Alkaline Phosphatase 01/25/2023 66   39 - 117 U/L Final   AST 01/25/2023 18  0 - 37 U/L Final   ALT 01/25/2023 18  0 - 53 U/L Final   Total Protein 01/25/2023 6.9  6.0 - 8.3 g/dL Final   Albumin 21/30/8657 4.2  3.5 - 5.2 g/dL Final   GFR 84/69/6295 55.22 (L)  >60.00 mL/min Final   Calculated using the CKD-EPI Creatinine Equation (2021)   Calcium 01/25/2023 9.3  8.4 - 10.5 mg/dL Final   Microalb, Ur 28/41/3244 <0.7  0.0 - 1.9 mg/dL Final   Creatinine,U 08/01/7251 91.9  mg/dL Final   Microalb Creat Ratio 01/25/2023 0.8  0.0 - 30.0 mg/g Final   Cholesterol 01/25/2023 77  0 - 200 mg/dL Final   ATP III Classification       Desirable:  < 200 mg/dL               Borderline High:  200 - 239 mg/dL          High:  > = 664 mg/dL   Triglycerides 40/34/7425 56.0  0.0 - 149.0 mg/dL Final   Normal:  <956 mg/dLBorderline High:  150 - 199 mg/dL   HDL 38/75/6433 29.51 (L)  >88.41 mg/dL Final   VLDL 66/12/3014 11.2  0.0 - 40.0 mg/dL Final   LDL Cholesterol 01/25/2023 38  0 - 99 mg/dL Final   Total CHOL/HDL Ratio 01/25/2023 3   Final                  Men          Women1/2 Average Risk     3.4          3.3Average Risk          5.0          4.42X Average Risk          9.6          7.13X Average Risk          15.0          11.0                       NonHDL 01/25/2023 49.00   Final   NOTE:  Non-HDL goal should be 30 mg/dL higher than patient's LDL goal (i.e. LDL goal of < 70 mg/dL, would have non-HDL goal of < 100 mg/dL)   Testosterone 13/02/6577 505.48  300.00 - 890.00 ng/dL Final     Physical Examination:   BP 124/75 (BP Location: Left Arm, Patient Position: Sitting)   Pulse 95   Ht 5\' 10"  (1.778 m)   Wt 260 lb 9.6 oz (118.2 kg)   SpO2 97%   BMI 37.39 kg/m       No ankle edema  ASSESSMENT/PLAN:   Diabetes type 2, with obesity  See history of present illness for detailed discussion of his current management, blood sugar patterns and problems identified  His A1c is higher at 7.8   He is taking Synjardy, Ozempic 2 mg daily and  Amaryl 1 mg daily  Blood sugars are are relatively higher at home even though he has taken 2 mg Ozempic since his last visit Also has lost weight Likely has higher readings from lack of exercise until recently Also blood sugar monitoring has been quite sporadic Unable to consider increasing his Amaryl because of readings as low as 98 during the day  HYPERTENSION:  His blood pressure is now well controlled May be benefiting from weight loss No history of microalbuminuria   Hypogonadism:   He is on twice a day Jatenzo 237 mg and subjectively doing well with testosterone level consistently normal, now 505  ANEMIA: He is significant microcytosis with high RBC count and this is relatively new Will check iron level and forward to PCP, he is getting his colonoscopy soon also  Patient Instructions  Check blood sugars on waking up 3 days a week  Also check blood sugars about 2 hours after meals and do this after different meals by rotation  Recommended blood sugar levels on waking up are 90-130 and about 2 hours after meal is 130-160  Please bring your blood sugar monitor to each visit, thank you      Reather Littler 02/05/2023, 9:32 AM

## 2023-02-05 NOTE — Patient Instructions (Signed)
Check blood sugars on waking up 3 days a week  Also check blood sugars about 2 hours after meals and do this after different meals by rotation  Recommended blood sugar levels on waking up are 90-130 and about 2 hours after meal is 130-160  Please bring your blood sugar monitor to each visit, thank you   

## 2023-02-06 ENCOUNTER — Other Ambulatory Visit: Payer: Self-pay

## 2023-02-06 ENCOUNTER — Telehealth: Payer: Self-pay | Admitting: Internal Medicine

## 2023-02-06 DIAGNOSIS — Z1211 Encounter for screening for malignant neoplasm of colon: Secondary | ICD-10-CM

## 2023-02-06 MED ORDER — NA SULFATE-K SULFATE-MG SULF 17.5-3.13-1.6 GM/177ML PO SOLN
1.0000 | Freq: Once | ORAL | 0 refills | Status: AC
Start: 2023-02-06 — End: 2023-02-06

## 2023-02-06 NOTE — Telephone Encounter (Signed)
Patient called in requesting to re-sent prescription for the Sup-pre for his procedure 7/15.Please advise

## 2023-02-06 NOTE — Telephone Encounter (Signed)
Spoke with pt; resent prep to [facility]

## 2023-02-10 ENCOUNTER — Telehealth: Payer: Self-pay | Admitting: Physician Assistant

## 2023-02-10 NOTE — Telephone Encounter (Signed)
Patient this morning, he is scheduled for colonoscopy with Dr. Rhea Belton tomorrow for follow-up of colon polyps.  He inadvertently took his Ozempic injection yesterday..  Advised best to cancel the colonoscopy for tomorrow, and will need to get him rescheduled.  Thank you

## 2023-02-11 ENCOUNTER — Encounter: Payer: BC Managed Care – PPO | Admitting: Internal Medicine

## 2023-02-11 NOTE — Telephone Encounter (Signed)
Needs colonoscopy rescheduled after 7 day hold of GLP medication

## 2023-02-11 NOTE — Telephone Encounter (Signed)
Please reschedule pts colonoscopy in the [facility]. He was scheduled for today but had to be cancelled due to him taking Ozempic yesterday.

## 2023-02-12 ENCOUNTER — Encounter: Payer: Self-pay | Admitting: Endocrinology

## 2023-02-12 ENCOUNTER — Other Ambulatory Visit: Payer: Self-pay

## 2023-02-12 MED ORDER — ACCU-CHEK GUIDE W/DEVICE KIT: 1.0000 | PACK | Freq: Three times a day (TID) | 0 refills | Status: AC

## 2023-02-12 NOTE — Telephone Encounter (Signed)
Called patient to inform of the colonoscopy schedule that needed to be re-scheduled due to having had taken his medication Ozempic prior to his last scheduled colonoscopy. Patient called to re-schedule a colonoscopy and patient stated he had already received an appt for the procedure. Noted appt scheduled for 04/28/23 at 8:00am. Patient was also reminded not to take the Ozempic medication on 04/26/23. Patient understood and agreed.

## 2023-02-25 ENCOUNTER — Ambulatory Visit (AMBULATORY_SURGERY_CENTER): Payer: BC Managed Care – PPO | Admitting: Internal Medicine

## 2023-02-25 ENCOUNTER — Encounter: Payer: Self-pay | Admitting: Internal Medicine

## 2023-02-25 VITALS — BP 104/68 | HR 83 | Temp 98.8°F | Resp 10 | Ht 71.0 in | Wt 260.0 lb

## 2023-02-25 DIAGNOSIS — Z09 Encounter for follow-up examination after completed treatment for conditions other than malignant neoplasm: Secondary | ICD-10-CM | POA: Diagnosis present

## 2023-02-25 DIAGNOSIS — Z860100 Personal history of colon polyps, unspecified: Secondary | ICD-10-CM | POA: Insufficient documentation

## 2023-02-25 MED ORDER — SODIUM CHLORIDE 0.9 % IV SOLN: 500.0000 mL | Freq: Once | INTRAVENOUS | Status: DC

## 2023-02-25 NOTE — Progress Notes (Signed)
GASTROENTEROLOGY PROCEDURE H&P NOTE   Primary Care Physician: Deeann Saint, MD    Reason for Procedure:  History of colon polyps  Plan:    Colonoscopy  Patient is appropriate for endoscopic procedure(s) in the ambulatory ([facility]) setting.  The nature of the procedure, as well as the risks, benefits, and alternatives were carefully and thoroughly reviewed with the patient. Ample time for discussion and questions allowed. The patient understood, was satisfied, and agreed to proceed.     HPI: Clifford Kramer is a 57 y.o. male who presents for colonoscopy.  Medical history as below.  Tolerated the prep.  No recent chest pain or shortness of breath.  No abdominal pain today.  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   GERD (gastroesophageal reflux disease)    Gout    Hyperlipidemia    Hypertension    RA (rheumatoid arthritis) (HCC)    Sleep apnea    CPAP use     Past Surgical History:  Procedure Laterality Date   COLONOSCOPY  2015   INSERTION OF MESH N/A 10/20/2021   Procedure: INSERTION OF MESH;  Surgeon: Diamantina Monks, MD;  Location: [facility] OR;  Service: General;  Laterality: N/A;   KNEE SURGERY Left 2005   VENTRAL HERNIA REPAIR N/A 10/20/2021   Procedure: LAPAROSCOPIC VENTRAL HERNIA REPAIR;  Surgeon: Diamantina Monks, MD;  Location: [facility] OR;  Service: General;  Laterality: N/A;    Prior to Admission medications   Medication Sig Start Date End Date Taking? Authorizing Provider  allopurinol (ZYLOPRIM) 300 MG tablet TAKE 1 TABLET DAILY 02/13/23  Yes Deeann Saint, MD  amLODipine (NORVASC) 2.5 MG tablet Take 1 tablet (2.5 mg total) by mouth daily. 11/15/22  Yes Reather Littler, MD  aspirin 81 MG tablet Take 81 mg by mouth daily.   Yes [provider]  BD PEN NEEDLE NANO U/F 32G X 4 MM MISC USE EVERY DAY 07/26/17  Yes Reather Littler, MD  Blood Glucose Monitoring Suppl (ACCU-CHEK GUIDE) w/Device KIT  1 each by Does not apply route 3 (three) times daily. Use accu chek guide device to check blood sugar twice daily. DX:E11.9 02/12/23  Yes Reather Littler, MD  Empagliflozin-metFORMIN HCl ER (SYNJARDY XR) 12.11-998 MG TB24 Take 2 tablets by mouth daily with breakfast. 01/30/23  Yes Reather Littler, MD  ENBREL SURECLICK 50 MG/ML injection Inject 50 mg into the skin every Monday. 06/28/20  Yes [provider]  glimepiride (AMARYL) 1 MG tablet TAKE 1 TABLET BY MOUTH EVERY DAY WITH BREAKFAST 12/10/22  Yes Reather Littler, MD  glucose blood (ONETOUCH VERIO) test strip Use to check blood sugar twice a day 02/28/22  Yes Reather Littler, MD  hydroxychloroquine (PLAQUENIL) 200 MG tablet Take 200 mg by mouth 2 (two) times daily. 12/21/22  Yes [provider]  losartan-hydrochlorothiazide (HYZAAR) 50-12.5 MG tablet TAKE 1 TABLET DAILY 02/13/23  Yes Deeann Saint, MD  omeprazole (PRILOSEC) 40 MG capsule TAKE 1 CAPSULE DAILY 02/13/23  Yes Deeann Saint, MD  OneTouch Delica Lancets 33G MISC Check blood sugar 2 times daily 03/08/22  Yes Reather Littler, MD  simvastatin (ZOCOR) 20 MG tablet Take 1 tablet (20 mg total) by mouth at bedtime. 08/09/22  Yes Deeann Saint, MD  Testosterone Undecanoate (JATENZO) 237 MG CAPS 1 capsule twice daily with food 11/01/22  Yes Reather Littler, MD  fluticasone [facility]) 50 MCG/ACT nasal spray PLACE  2 SPRAYS INTO THE NOSE DAILY. Patient taking differently: Place 2 sprays into both nostrils daily as needed for allergies. 06/21/20   Deeann Saint, MD  loratadine (CLARITIN) 10 MG tablet Take 1 tablet (10 mg total) by mouth daily. 03/28/22   Deeann Saint, MD  OZEMPIC, 2 MG/DOSE, 8 MG/3ML SOPN INJECT 2 MG WEEKLY FOR DIABETES 04/13/22   Reather Littler, MD  vardenafil (LEVITRA) 20 MG tablet Take by mouth. 05/27/13   [provider]  VIAGRA 100 MG tablet Take 100 mg by mouth daily as needed for erectile dysfunction. 04/01/14   [provider]    Current Outpatient Medications   Medication Sig Dispense Refill   allopurinol (ZYLOPRIM) 300 MG tablet TAKE 1 TABLET DAILY 90 tablet 0   amLODipine (NORVASC) 2.5 MG tablet Take 1 tablet (2.5 mg total) by mouth daily. 90 tablet 3   aspirin 81 MG tablet Take 81 mg by mouth daily.     BD PEN NEEDLE NANO U/F 32G X 4 MM MISC USE EVERY DAY 100 each 1   Blood Glucose Monitoring Suppl (ACCU-CHEK GUIDE) w/Device KIT 1 each by Does not apply route 3 (three) times daily. Use accu chek guide device to check blood sugar twice daily. DX:E11.9 1 kit 0   Empagliflozin-metFORMIN HCl ER (SYNJARDY XR) 12.11-998 MG TB24 Take 2 tablets by mouth daily with breakfast. 180 tablet 2   ENBREL SURECLICK 50 MG/ML injection Inject 50 mg into the skin every Monday.     glimepiride (AMARYL) 1 MG tablet TAKE 1 TABLET BY MOUTH EVERY DAY WITH BREAKFAST 90 tablet 3   glucose blood (ONETOUCH VERIO) test strip Use to check blood sugar twice a day 100 each 12   hydroxychloroquine (PLAQUENIL) 200 MG tablet Take 200 mg by mouth 2 (two) times daily.     losartan-hydrochlorothiazide (HYZAAR) 50-12.5 MG tablet TAKE 1 TABLET DAILY 90 tablet 0   omeprazole (PRILOSEC) 40 MG capsule TAKE 1 CAPSULE DAILY 90 capsule 0   OneTouch Delica Lancets 33G MISC Check blood sugar 2 times daily 100 each 12   simvastatin (ZOCOR) 20 MG tablet Take 1 tablet (20 mg total) by mouth at bedtime. 90 tablet 4   Testosterone Undecanoate (JATENZO) 237 MG CAPS 1 capsule twice daily with food 180 capsule 2   fluticasone (FLONASE) 50 MCG/ACT nasal spray PLACE 2 SPRAYS INTO THE NOSE DAILY. (Patient taking differently: Place 2 sprays into both nostrils daily as needed for allergies.) 48 mL 1   loratadine (CLARITIN) 10 MG tablet Take 1 tablet (10 mg total) by mouth daily. 90 tablet 0   OZEMPIC, 2 MG/DOSE, 8 MG/3ML SOPN INJECT 2 MG WEEKLY FOR DIABETES 9 mL 3   vardenafil (LEVITRA) 20 MG tablet Take by mouth.     VIAGRA 100 MG tablet Take 100 mg by mouth daily as needed for erectile dysfunction.      Current Facility-Administered Medications  Medication Dose Route Frequency Provider Last Rate Last Admin   0.9 %  sodium chloride infusion  500 mL Intravenous Once Latrise Bowland, Carie Caddy, MD        Allergies as of 02/25/2023 - Review Complete 02/25/2023  Allergen Reaction Noted   Celebrex [celecoxib] Itching 03/13/2007   Penicillins Itching 10/26/2011   Sulfonamide derivatives Itching 03/13/2007    Family History  Problem Relation Age of Onset   Diabetes Mother    Diabetes Maternal Aunt    Diabetes Maternal Grandmother    Stomach cancer Other    Heart disease Neg  Hx    Colon cancer Neg Hx    Esophageal cancer Neg Hx    Rectal cancer Neg Hx    Colon polyps Neg Hx     Social History   Socioeconomic History   Marital status: Married    Spouse name: Not on file   Number of children: Not on file   Years of education: Not on file   Highest education level: Not on file  Occupational History   Not on file  Tobacco Use   Smoking status: Never   Smokeless tobacco: Never  Vaping Use   Vaping status: Never Used  Substance and Sexual Activity   Alcohol use: Yes    Comment: rare beer- 3 beers per year   Drug use: Not Currently   Sexual activity: Yes  Other Topics Concern   Not on file  Social History Narrative   Not on file   Social Determinants of Health   Financial Resource Strain: Not on file  Food Insecurity: Not on file  Transportation Needs: Not on file  Physical Activity: Not on file  Stress: Not on file  Social Connections: Unknown (12/08/2021)   Received from [facility]   Social Network    Social Network: Not on file  Intimate Partner Violence: Unknown (10/30/2021)   Received from [facility], [facility]   HITS    Physically Hurt: Not on file    Insult or Talk Down To: Not on file    Threaten Physical Harm: Not on file    Scream or Curse: Not on file    Physical Exam: Vital signs in last 24 hours: @BP  102/68   Pulse 82   Temp 98.8 F  (37.1 C) (Temporal)   Ht 5\' 11"  (1.803 m)   Wt 260 lb (117.9 kg)   SpO2 97%   BMI 36.26 kg/m  GEN: NAD EYE: Sclerae anicteric ENT: MMM CV: Non-tachycardic Pulm: CTA b/l GI: Soft, NT/ND NEURO:  Alert & Oriented x 3   Erick Blinks, MD [facility]  02/25/2023 7:55 AM

## 2023-02-25 NOTE — Progress Notes (Signed)
Pt's states no medical or surgical changes since previsit or office visit. 

## 2023-02-25 NOTE — Progress Notes (Signed)
Uneventful anesthetic. Report to pacu rn. Vss. Care resumed by rn. 

## 2023-02-25 NOTE — Op Note (Signed)
[facility] Patient Name: Clifford Kramer Procedure Date: 02/25/2023 7:50 AM MRN: [id number] Endoscopist: Beverley Fiedler , MD, [id number] Age: 57 Referring MD:  Date of Birth: 06/08/1966 Gender: Male Account #: [id number] Procedure:                Colonoscopy Indications:              High risk colon cancer surveillance: Personal                            history of sessile serrated colon polyp (less than                            10 mm in size) with no dysplasia and adenoma, Last                            colonoscopy: March 2019 (no polyps); Dec 2015 (SSP                            x 2, adenoma x 1) Medicines:                Monitored Anesthesia Care Procedure:                Pre-Anesthesia Assessment:                           - Prior to the procedure, a History and Physical                            was performed, and patient medications and                            allergies were reviewed. The patient's tolerance of                            previous anesthesia was also reviewed. The risks                            and benefits of the procedure and the sedation                            options and risks were discussed with the patient.                            All questions were answered, and informed consent                            was obtained. Prior Anticoagulants: The patient has                            taken no anticoagulant or antiplatelet agents. ASA                            Grade Assessment: II - A patient with mild systemic  disease. After reviewing the risks and benefits,                            the patient was deemed in satisfactory condition to                            undergo the procedure.                           After obtaining informed consent, the colonoscope                            was passed under direct vision. Throughout the                            procedure, the patient's blood pressure, pulse,  and                            oxygen saturations were monitored continuously. The                            Olympus Scope SN: [id number] was introduced through                            the anus and advanced to the cecum, identified by                            appendiceal orifice and ileocecal valve. The                            colonoscopy was performed without difficulty. The                            patient tolerated the procedure well. The quality                            of the bowel preparation was good. The ileocecal                            valve, appendiceal orifice, and rectum were                            photographed. Scope In: 8:11:17 AM Scope Out: 8:24:16 AM Scope Withdrawal Time: 0 hours 10 minutes 38 seconds  Total Procedure Duration: 0 hours 12 minutes 59 seconds  Findings:                 The digital rectal exam was normal.                           A few small-mouthed diverticula were found in the                            sigmoid colon.  The exam was otherwise without abnormality on                            direct and retroflexion views. Complications:            No immediate complications. Estimated Blood Loss:     Estimated blood loss: none. Impression:               - Mild diverticulosis in the sigmoid colon.                           - The examination was otherwise normal on direct                            and retroflexion views.                           - No specimens collected. Recommendation:           - Patient has a contact number available for                            emergencies. The signs and symptoms of potential                            delayed complications were discussed with the                            patient. Return to normal activities tomorrow.                            Written discharge instructions were provided to the                            patient.                           - Resume  previous diet.                           - Continue present medications.                           - Await pathology results.                           - If iron studies are low (ferritin) then EGD is                            recommended.                           - Repeat colonoscopy in 10 years for surveillance. Beverley Fiedler, MD 02/25/2023 8:29:00 AM This report has been signed electronically.

## 2023-02-25 NOTE — Patient Instructions (Addendum)
-   Return to normal activities tomorrow.  - Written discharge instructions were provided to the patient. - Resume previous diet. - Continue present medications. - Await pathology results. - If iron studies are low (ferritin) then EGD is recommended. - Repeat colonoscopy in 10 years for surveillance.   YOU HAD AN ENDOSCOPIC PROCEDURE TODAY AT THE [facility]:   Refer to the procedure report that was given to you for any specific questions about what was found during the examination.  If the procedure report does not answer your questions, please call your gastroenterologist to clarify.  If you requested that your care partner not be given the details of your procedure findings, then the procedure report has been included in a sealed envelope for you to review at your convenience later.  YOU SHOULD EXPECT: Some feelings of bloating in the abdomen. Passage of more gas than usual.  Walking can help get rid of the air that was put into your GI tract during the procedure and reduce the bloating. If you had a lower endoscopy (such as a colonoscopy or flexible sigmoidoscopy) you may notice spotting of blood in your stool or on the toilet paper. If you underwent a bowel prep for your procedure, you may not have a normal bowel movement for a few days.  Please Note:  You might notice some irritation and congestion in your nose or some drainage.  This is from the oxygen used during your procedure.  There is no need for concern and it should clear up in a day or so.  SYMPTOMS TO REPORT IMMEDIATELY:  Following lower endoscopy (colonoscopy or flexible sigmoidoscopy):  Excessive amounts of blood in the stool  Significant tenderness or worsening of abdominal pains  Swelling of the abdomen that is new, acute  Fever of 100F or higher  For urgent or emergent issues, a gastroenterologist can be reached at any hour by calling [phone]. Do not use MyChart messaging for urgent concerns.     DIET:  We do recommend a small meal at first, but then you may proceed to your regular diet.  Drink plenty of fluids but you should avoid alcoholic beverages for 24 hours.  ACTIVITY:  You should plan to take it easy for the rest of today and you should NOT DRIVE or use heavy machinery until tomorrow (because of the sedation medicines used during the test).    FOLLOW UP: Our staff will call the number listed on your records the next business day following your procedure.  We will call around 7:15- 8:00 am to check on you and address any questions or concerns that you may have regarding the information given to you following your procedure. If we do not reach you, we will leave a message.     If any biopsies were taken you will be contacted by phone or by letter within the next 1-3 weeks.  Please call us at [phone] if you have not heard about the biopsies in 3 weeks.    SIGNATURES/CONFIDENTIALITY: You and/or your care partner have signed paperwork which will be entered into your electronic medical record.  These signatures attest to the fact that that the information above on your After Visit Summary has been reviewed and is understood.  Full responsibility of the confidentiality of this discharge information lies with you and/or your care-partner.

## 2023-02-26 ENCOUNTER — Telehealth: Payer: Self-pay

## 2023-02-26 NOTE — Telephone Encounter (Signed)
  Follow up Call-     02/25/2023    7:37 AM  Call back number  Post procedure Call Back phone  # [phone]  Permission to leave phone message Yes     Patient questions:  Do you have a fever, pain , or abdominal swelling? No. Pain Score  0 *  Have you tolerated food without any problems? Yes.    Have you been able to return to your normal activities? Yes.    Do you have any questions about your discharge instructions: Diet   No. Medications  No. Follow up visit  No.  Do you have questions or concerns about your Care? No.  Actions: * If pain score is 4 or above: No action needed, pain <4.

## 2023-04-08 ENCOUNTER — Encounter: Payer: Self-pay | Admitting: Family Medicine

## 2023-05-13 ENCOUNTER — Ambulatory Visit (INDEPENDENT_AMBULATORY_CARE_PROVIDER_SITE_OTHER): Payer: BC Managed Care – PPO | Admitting: Podiatry

## 2023-05-13 ENCOUNTER — Encounter: Payer: Self-pay | Admitting: Podiatry

## 2023-05-13 ENCOUNTER — Encounter: Payer: Self-pay | Admitting: Family Medicine

## 2023-05-13 ENCOUNTER — Other Ambulatory Visit (INDEPENDENT_AMBULATORY_CARE_PROVIDER_SITE_OTHER): Payer: BC Managed Care – PPO

## 2023-05-13 VITALS — BP 138/88 | HR 97 | Temp 97.9°F | Resp 18 | Ht 71.0 in | Wt 260.0 lb

## 2023-05-13 DIAGNOSIS — E1165 Type 2 diabetes mellitus with hyperglycemia: Secondary | ICD-10-CM | POA: Diagnosis not present

## 2023-05-13 DIAGNOSIS — E119 Type 2 diabetes mellitus without complications: Secondary | ICD-10-CM | POA: Diagnosis not present

## 2023-05-13 DIAGNOSIS — E291 Testicular hypofunction: Secondary | ICD-10-CM

## 2023-05-13 DIAGNOSIS — D509 Iron deficiency anemia, unspecified: Secondary | ICD-10-CM | POA: Diagnosis not present

## 2023-05-13 LAB — TESTOSTERONE: Testosterone: 293.81 ng/dL — ABNORMAL LOW (ref 300.00–890.00)

## 2023-05-13 LAB — BASIC METABOLIC PANEL
BUN: 13 mg/dL (ref 6–23)
CO2: 28 meq/L (ref 19–32)
Calcium: 9.2 mg/dL (ref 8.4–10.5)
Chloride: 106 meq/L (ref 96–112)
Creatinine, Ser: 1.35 mg/dL (ref 0.40–1.50)
GFR: 57.56 mL/min — ABNORMAL LOW (ref >60.00)
Glucose, Bld: 120 mg/dL — ABNORMAL HIGH (ref 70–99)
Potassium: 4 meq/L (ref 3.5–5.1)
Sodium: 142 meq/L (ref 135–145)

## 2023-05-13 LAB — IBC PANEL
Iron: 28 ug/dL — ABNORMAL LOW (ref 42–165)
Saturation Ratios: 4.9 % — ABNORMAL LOW (ref 20.0–50.0)
TIBC: 569.8 ug/dL — ABNORMAL HIGH (ref 250.0–450.0)
Transferrin: 407 mg/dL — ABNORMAL HIGH (ref 212.0–360.0)

## 2023-05-13 LAB — HEMOGLOBIN A1C: Hgb A1c MFr Bld: 7.7 % — ABNORMAL HIGH (ref 4.6–6.5)

## 2023-05-13 NOTE — Progress Notes (Signed)
   Chief Complaint  Patient presents with   Diabetes    DFC/nail trim numbness in toes    SUBJECTIVE Patient with a history of diabetes mellitus presents to office today for routine diabetic foot exam.  Patient does have occasional tenderness to the great toe joints bilateral but overall it is very manageable.  Last A1c 01/25/2023 was 7.8.    Patient states that lately he has been trimming his own toenails with no issues or complaint.  Patient is here for further evaluation and treatment.   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   GERD (gastroesophageal reflux disease)    Gout    Hyperlipidemia    Hypertension    RA (rheumatoid arthritis) (HCC)    Sleep apnea    CPAP use    Past Surgical History:  Procedure Laterality Date   COLONOSCOPY  2015   INSERTION OF MESH N/A 10/20/2021   Procedure: INSERTION OF MESH;  Surgeon: Diamantina Monks, MD;  Location: [facility] OR;  Service: General;  Laterality: N/A;   KNEE SURGERY Left 2005   VENTRAL HERNIA REPAIR N/A 10/20/2021   Procedure: LAPAROSCOPIC VENTRAL HERNIA REPAIR;  Surgeon: Diamantina Monks, MD;  Location: [facility] OR;  Service: General;  Laterality: N/A;   Allergies  Allergen Reactions   Celebrex [Celecoxib] Itching   Penicillins Itching   Sulfonamide Derivatives Itching    OBJECTIVE General Patient is awake, alert, and oriented x 3 and in no acute distress. Derm Skin is dry and supple bilateral. Negative open lesions or macerations. Remaining integument unremarkable.  Hyperkeratotic thickened and dystrophic with subungual debris, consistent with onychomycosis, 1-5 bilateral. No signs of infection noted. Vasc  DP and PT pedal pulses palpable bilaterally. Temperature gradient within normal limits.  Neuro light touch and protective threshold sensation diminished bilaterally.  Musculoskeletal Exam hallux valgus deformity noted left.  Hallux limitus noted right with  limited range of motion and crepitus to the right great toe joint  ASSESSMENT 1. Diabetes Mellitus w/ peripheral neuropathy 2.  Pain due to onychomycosis of toenails bilateral  PLAN OF CARE -Patient evaluated today.  Comprehensive diabetic foot exam performed today.  Patient doing very well. -Instructed to maintain good pedal hygiene and foot care. Stressed importance of controlling blood sugar.  -return to clinic annually for routine diabetic foot exam . *Works as a Sports administrator for [employer]   Felecia Shelling, DPM [facility]  Dr. Felecia Shelling, DPM    [address] [place], [place] [address]                Office [phone]  Fax [phone]

## 2023-05-20 ENCOUNTER — Ambulatory Visit (INDEPENDENT_AMBULATORY_CARE_PROVIDER_SITE_OTHER): Payer: BC Managed Care – PPO | Admitting: Endocrinology

## 2023-05-20 ENCOUNTER — Encounter: Payer: Self-pay | Admitting: Family Medicine

## 2023-05-20 ENCOUNTER — Encounter: Payer: Self-pay | Admitting: Endocrinology

## 2023-05-20 VITALS — BP 138/80 | HR 85 | Resp 20 | Ht 71.0 in | Wt 259.8 lb

## 2023-05-20 DIAGNOSIS — E291 Testicular hypofunction: Secondary | ICD-10-CM

## 2023-05-20 DIAGNOSIS — Z5181 Encounter for therapeutic drug level monitoring: Secondary | ICD-10-CM

## 2023-05-20 DIAGNOSIS — Z7985 Long-term (current) use of injectable non-insulin antidiabetic drugs: Secondary | ICD-10-CM

## 2023-05-20 DIAGNOSIS — E611 Iron deficiency: Secondary | ICD-10-CM | POA: Diagnosis not present

## 2023-05-20 DIAGNOSIS — E1165 Type 2 diabetes mellitus with hyperglycemia: Secondary | ICD-10-CM

## 2023-05-20 DIAGNOSIS — Z7984 Long term (current) use of oral hypoglycemic drugs: Secondary | ICD-10-CM

## 2023-05-20 MED ORDER — GLUCOSE BLOOD VI STRP: ORAL_STRIP | 12 refills | Status: AC

## 2023-05-20 MED ORDER — ONETOUCH DELICA LANCETS 33G MISC: 12 refills | Status: AC

## 2023-05-20 NOTE — Patient Instructions (Signed)
Diabetes regimen same.  Check sugar in the morning fasting and at bedtime.  Talk with PCP regarding iron deficiency.

## 2023-05-20 NOTE — Progress Notes (Signed)
Outpatient Endocrinology Note Clifford Ambyr Qadri, Kramer  05/20/23  Patient's Name: Clifford Kramer    DOB: 10-26-1965    MRN: [id number]                                                    REASON OF VISIT: Follow-up of type 2 diabetes mellitus  REFERRING PROVIDER:   PCP: Clifford Kramer  HISTORY OF PRESENT ILLNESS:   Clifford Kramer is a 57 y.o. old male with past medical history listed below, is here for follow up of type 2 diabetes mellitus.   Pertinent Diabetes History: Patient was diagnosed with type 2 diabetes mellitus in 2005.  Chronic Diabetes Complications : Retinopathy: no. Last ophthalmology exam was done on annually, at [place]. Nephropathy: CKD IIIa, on losartan Peripheral neuropathy: yes, follow with podiatry.  Coronary artery disease: no Stroke: no  Relevant comorbidities and cardiovascular risk factors: Obesity: yes Body mass index is 36.23 kg/m.  Hypertension: yes Hyperlipidemia. Yes, on statin.   Current / Home Diabetic regimen includes: Ozempic 2 mg weekly. Synjardy 12.5-1000mg  2 tab daily.  Amaryl 1mg  daily with breakfast.   Prior diabetic medications: Glipizide stopped due to ? hypoglycemia.  Glycemic data: Accu-Chek glucometer: Not able to download in the clinic today, glucose reviewed directly from meter as follows he has been taking at different times a day few times a week.  Mostly acceptable blood sugar. -  136, 117, 95,146, 105, 111, 114, 117, 121, 127, 93, 97, 107, 159.   Hypoglycemia: Patient has no hypoglycemic episodes. Patient has hypoglycemia awareness.  Factors modifying glucose control: 1.  Diabetic diet assessment: 3 meals a day.  2.  Staying active or exercising: ?  No formal exercise.  Start working at [employer] AM for [employer].  3.  Medication compliance: compliant all of the time.  # HYPOGONADISM:  He has been on testosterone supplements since 2011 when his level was mildly low around 300  but no further evaluation done. He has a relatively low  testosterone level while using Axiron and had difficulty with the application. With Androderm patches he had improved testosterone levels and less fatigue. His testosterone level had been inconsistent with using 8 mg Androderm and was taking the 6 mg total dose. He was getting more skin irritation and sensitivity to the Androderm, also had nasal irritation/bad taste with Natesto.  In early 2024 switched to taking Jatenzo 237 mg twice a day with food, with the higher dose he has consistently good energy level.  Interval history 05/20/23 Glucometer data as reviewed above.  Diabetes regimen as mentioned above.  Recent hemoglobin A1c 7.7% stable, is uncontrolled.  Recent testosterone level slightly low however he had mid normal to high normal range of testosterone on current dose of Jatenzo.  He is overall feeling good.  He complains of occasional erectile dysfunction however normal libido and normal energy.  Patient iron studies consistent with iron deficiency.  He is asked to talk with primary care provider for further evaluation and management.  He has appointment in November.  REVIEW OF SYSTEMS As per history of present illness.   PAST MEDICAL HISTORY: 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   GERD (gastroesophageal reflux disease)    Gout  Hyperlipidemia    Hypertension    RA (rheumatoid arthritis) (HCC)    Sleep apnea    CPAP use     PAST SURGICAL HISTORY: Past Surgical History:  Procedure Laterality Date   COLONOSCOPY  2015   INSERTION OF MESH N/A 10/20/2021   Procedure: INSERTION OF MESH;  Surgeon: Diamantina Monks, Kramer;  Location: [facility];  Service: General;  Laterality: N/A;   KNEE SURGERY Left 2005   VENTRAL HERNIA REPAIR N/A 10/20/2021   Procedure: LAPAROSCOPIC VENTRAL HERNIA REPAIR;  Surgeon: Diamantina Monks, Kramer;  Location: [facility];  Service: General;  Laterality: N/A;    ALLERGIES: Allergies   Allergen Reactions   Celebrex [Celecoxib] Itching   Penicillins Itching   Sulfonamide Derivatives Itching    FAMILY HISTORY:  Family History  Problem Relation Age of Onset   Diabetes Mother    Diabetes Maternal Aunt    Diabetes Maternal Grandmother    Stomach cancer Other    Heart disease Neg Hx    Colon cancer Neg Hx    Esophageal cancer Neg Hx    Rectal cancer Neg Hx    Colon polyps Neg Hx     SOCIAL HISTORY: Social History   Socioeconomic History   Marital status: Married    Spouse name: Not on file   Number of children: Not on file   Years of education: Not on file   Highest education level: Not on file  Occupational History   Not on file  Tobacco Use   Smoking status: Never   Smokeless tobacco: Never  Vaping Use   Vaping status: Never Used  Substance and Sexual Activity   Alcohol use: Yes    Comment: rare beer- 3 beers per year   Drug use: Not Currently   Sexual activity: Yes  Other Topics Concern   Not on file  Social History Narrative   Not on file   Social Determinants of Health   Financial Resource Strain: Not on file  Food Insecurity: Not on file  Transportation Needs: Not on file  Physical Activity: Not on file  Stress: Not on file  Social Connections: Unknown (12/08/2021)   [facility]   Social Network    Social Network: Not on file    MEDICATIONS:  Current Outpatient Medications  Medication Sig Dispense Refill   allopurinol (ZYLOPRIM) 300 MG tablet TAKE 1 TABLET DAILY 90 tablet 0   amLODipine (NORVASC) 2.5 MG tablet Take 1 tablet (2.5 mg total) by mouth daily. 90 tablet 3   aspirin 81 MG tablet Take 81 mg by mouth daily.     BD PEN NEEDLE NANO U/F 32G X 4 MM MISC USE EVERY DAY 100 each 1   Blood Glucose Monitoring Suppl (ACCU-CHEK GUIDE) w/Device KIT 1 each by Does not apply route 3 (three) times daily. Use accu chek guide device to check blood sugar twice daily. DX:E11.9 1 kit 0   Empagliflozin-metFORMIN  HCl ER (SYNJARDY XR) 12.11-998 MG TB24 Take 2 tablets by mouth daily with breakfast. 180 tablet 2   ENBREL SURECLICK 50 MG/ML injection Inject 50 mg into the skin every Monday.     fluticasone (FLONASE) 50 MCG/ACT nasal spray PLACE 2 SPRAYS INTO THE NOSE DAILY. (Patient taking differently: Place 2 sprays into both nostrils daily as needed for allergies.) 48 mL 1   glimepiride (AMARYL) 1 MG tablet TAKE 1 TABLET BY MOUTH EVERY DAY WITH BREAKFAST 90 tablet 3   glucose blood test strip  Use as instructed 100 each 12   hydroxychloroquine (PLAQUENIL) 200 MG tablet Take 200 mg by mouth 2 (two) times daily.     loratadine (CLARITIN) 10 MG tablet Take 1 tablet (10 mg total) by mouth daily. 90 tablet 0   losartan-hydrochlorothiazide (HYZAAR) 50-12.5 MG tablet TAKE 1 TABLET DAILY 90 tablet 0   omeprazole (PRILOSEC) 40 MG capsule TAKE 1 CAPSULE DAILY 90 capsule 0   OZEMPIC, 2 MG/DOSE, 8 MG/3ML SOPN INJECT 2 MG WEEKLY FOR DIABETES 9 mL 3   simvastatin (ZOCOR) 20 MG tablet Take 1 tablet (20 mg total) by mouth at bedtime. 90 tablet 4   Testosterone Undecanoate (JATENZO) 237 MG CAPS 1 capsule twice daily with food 180 capsule 2   vardenafil (LEVITRA) 20 MG tablet Take by mouth.     VIAGRA 100 MG tablet Take 100 mg by mouth daily as needed for erectile dysfunction.     OneTouch Delica Lancets 33G MISC Check blood sugar 2 times daily 100 each 12   No current facility-administered medications for this visit.    PHYSICAL EXAM: Vitals:   05/20/23 0830  BP: 138/80  Pulse: 85  Resp: 20  SpO2: 97%  Weight: 259 lb 12.8 oz (117.8 kg)  Height: 5\' 11"  (1.803 m)   Body mass index is 36.23 kg/m.  Wt Readings from Last 3 Encounters:  05/20/23 259 lb 12.8 oz (117.8 kg)  05/13/23 260 lb (117.9 kg)  02/25/23 260 lb (117.9 kg)    General: Well developed, well nourished male in no apparent distress.  HEENT: AT/[place], no external lesions.  Eyes: Conjunctiva clear and no icterus. Neck: Neck supple  Lungs: Respirations  not labored Neurologic: Alert, oriented, normal speech Extremities / Skin: Dry. No sores or rashes noted.  Psychiatric: Does not appear depressed or anxious  Diabetic Foot Exam - Simple   No data filed     LABS Reviewed Lab Results  Component Value Date   HGBA1C 7.7 (H) 05/13/2023   HGBA1C 7.8 (H) 01/25/2023   HGBA1C 7.3 (H) 10/01/2022   Lab Results  Component Value Date   FRUCTOSAMINE 254 01/09/2016   FRUCTOSAMINE 264 08/24/2013   Lab Results  Component Value Date   CHOL 77 01/25/2023   HDL 27.70 (L) 01/25/2023   LDLCALC 38 01/25/2023   TRIG 56.0 01/25/2023   CHOLHDL 3 01/25/2023   Lab Results  Component Value Date   MICRALBCREAT 0.8 01/25/2023   MICRALBCREAT 0.5 10/30/2021   Lab Results  Component Value Date   CREATININE 1.35 05/13/2023   Lab Results  Component Value Date   GFR 57.56 (L) 05/13/2023    ASSESSMENT / PLAN  1. Uncontrolled type 2 diabetes mellitus with hyperglycemia, without long-term current use of insulin (HCC)   2. Male hypogonadism   3. Encounter for medication monitoring   4. Iron deficiency     Diabetes Mellitus type 2, complicated by diabetic neuropathy/CKD. - Diabetic status / severity: Uncontrolled.  Lab Results  Component Value Date   HGBA1C 7.7 (H) 05/13/2023    - Hemoglobin A1c goal : <7%  His hemoglobin A1c is higher however glucometer data mostly settable blood sugar.  He has not been checking frequently however he has been taking couple of times a week.  There is a possibility that he may be having postprandial hyperglycemia.  Patient asked to monitor blood sugar at least in the morning fasting and at bedtime every day.  I would also like to check fructosamine in the next visit to see  if there is any false elevation of hemoglobin A1c.  - Medications: No change.  Ozempic 2 mg weekly. Synjardy 12.5-1000mg  2 tab daily.  Amaryl 1mg  daily with breakfast.   - Home glucose testing: In the morning fasting and bedtime at  least. - Discussed/ Gave Hypoglycemia treatment plan.  # Consult : not required at this time.   # Annual urine for microalbuminuria/ creatinine ratio, no microalbuminuria currently, continue ACE/ARB /losartan. Last  Lab Results  Component Value Date   MICRALBCREAT 0.8 01/25/2023    # Foot check nightly / neuropathy.  # Annual dilated diabetic eye exams.   - Diet: Make healthy diabetic food choices - Life style / activity / exercise: Discussed.  2. Blood pressure  -  BP Readings from Last 1 Encounters:  05/20/23 138/80    - Control is in target.  - No change in current plans.  3. Lipid status / Hyperlipidemia - Last  Lab Results  Component Value Date   LDLCALC 38 01/25/2023   - Continue simvastatin 20 mg daily.  # Hypogonadism: -He has been on testosterone supplement since 2011. -Continue Jatenzo 237 mg 2 times per day with food.  Will not change dose at this time. -Will check total testosterone, PSA in the next follow-up visit.  # Iron deficiency -Recent labs consistent with iron deficiency.  Patient has appointment with primary care provider next month.  Patient is asked to discuss with primary care provider for the evaluation and management of iron deficiency anemia.  I will route this note to his primary care provider.  Diagnoses and all orders for this visit:  Uncontrolled type 2 diabetes mellitus with hyperglycemia, without long-term current use of insulin (HCC) -     Hemoglobin A1c; Future -     Basic metabolic panel; Future -     Fructosamine; Future  Male hypogonadism -     Testosterone, Total, LC/MS/MS; Future  Encounter for medication monitoring -     PSA; Future  Iron deficiency  Other orders -     OneTouch Delica Lancets 33G MISC; Check blood sugar 2 times daily -     glucose blood test strip; Use as instructed    DISPOSITION Follow up in clinic in 3  months suggested.   All questions answered and patient verbalized understanding of the  plan.  Clifford Avagrace Botelho, Kramer [facility] Endocrinology [facility] [address] [place], [address] [place], [place] [address] Phone # [phone]  At least part of this note was generated using voice recognition software. Inadvertent word errors may have occurred, which were not recognized during the proofreading process.

## 2023-05-22 ENCOUNTER — Encounter: Payer: Self-pay | Admitting: Endocrinology

## 2023-05-22 DIAGNOSIS — E291 Testicular hypofunction: Secondary | ICD-10-CM

## 2023-05-22 MED ORDER — JATENZO 237 MG PO CAPS: ORAL_CAPSULE | ORAL | 2 refills | Status: DC

## 2023-05-22 NOTE — Telephone Encounter (Signed)
Did the endocrinologist start supplement or discussed treatment of the iron deficiency anemia?  If not would have patient scheduled an appointment this week.

## 2023-05-22 NOTE — Telephone Encounter (Signed)
Spoke with patient the endocrinologist did not start a supplement of discuss treatment with the patient, the patient is coming in 05/23/2023

## 2023-05-23 ENCOUNTER — Telehealth: Payer: Self-pay

## 2023-05-23 ENCOUNTER — Encounter: Payer: Self-pay | Admitting: Family Medicine

## 2023-05-23 ENCOUNTER — Other Ambulatory Visit: Payer: Self-pay

## 2023-05-23 ENCOUNTER — Ambulatory Visit: Payer: BC Managed Care – PPO | Admitting: Family Medicine

## 2023-05-23 VITALS — BP 130/82 | HR 75 | Temp 98.1°F | Ht 71.0 in | Wt 261.0 lb

## 2023-05-23 DIAGNOSIS — D509 Iron deficiency anemia, unspecified: Secondary | ICD-10-CM | POA: Insufficient documentation

## 2023-05-23 LAB — CBC WITH DIFFERENTIAL/PLATELET
Basophils Absolute: 0.1 10*3/uL (ref 0.0–0.1)
Basophils Relative: 1.1 % (ref 0.0–3.0)
Eosinophils Absolute: 0.2 10*3/uL (ref 0.0–0.7)
Eosinophils Relative: 3.3 % (ref 0.0–5.0)
HCT: 42.7 % (ref 39.0–52.0)
Hemoglobin: 13.2 g/dL (ref 13.0–17.0)
Lymphocytes Relative: 42.4 % (ref 12.0–46.0)
Lymphs Abs: 2.3 10*3/uL (ref 0.7–4.0)
MCHC: 30.9 g/dL (ref 30.0–36.0)
MCV: 65.5 fL — ABNORMAL LOW (ref 78.0–100.0)
Monocytes Absolute: 0.6 10*3/uL (ref 0.1–1.0)
Monocytes Relative: 11.2 % (ref 3.0–12.0)
Neutro Abs: 2.3 10*3/uL (ref 1.4–7.7)
Neutrophils Relative %: 42 % — ABNORMAL LOW (ref 43.0–77.0)
Platelets: 285 10*3/uL (ref 150.0–400.0)
RBC: 6.53 Mil/uL — ABNORMAL HIGH (ref 4.22–5.81)
RDW: 19.2 % — ABNORMAL HIGH (ref 11.5–15.5)
WBC: 5.5 10*3/uL (ref 4.0–10.5)

## 2023-05-23 MED ORDER — JATENZO 237 MG PO CAPS: ORAL_CAPSULE | ORAL | 2 refills | Status: DC

## 2023-05-23 MED ORDER — FERROUS GLUCONATE 324 (38 FE) MG PO TABS
324.0000 mg | ORAL_TABLET | Freq: Every day | ORAL | 1 refills | Status: DC
Start: 2023-05-23 — End: 2023-11-19

## 2023-05-23 NOTE — Progress Notes (Signed)
Established Patient Office Visit   Subjective  Patient ID: Clifford Kramer, male    DOB: 02/21/1966  Age: 57 y.o. MRN: [id number]  Chief Complaint  Patient presents with   Medical Management of Chronic Issues    Low iron labs     Patient is a 57 year old male seen for follow-up.  Iron deficiency noted from labs done at endocrinology.  Patient endorses increased SOB with walking in parking lot at work, dizziness, increased headaches, and eating ice the last several months.  Denies changes in stools, nausea, vomiting.  Patient had colonoscopy negative 01/2023.  10-year recall advised.      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   GERD (gastroesophageal reflux disease)    Gout    Hyperlipidemia    Hypertension    RA (rheumatoid arthritis) (HCC)    Sleep apnea    CPAP use    Past Surgical History:  Procedure Laterality Date   COLONOSCOPY  2015   INSERTION OF MESH N/A 10/20/2021   Procedure: INSERTION OF MESH;  Surgeon: Diamantina Monks, MD;  Location: [facility] OR;  Service: General;  Laterality: N/A;   KNEE SURGERY Left 2005   VENTRAL HERNIA REPAIR N/A 10/20/2021   Procedure: LAPAROSCOPIC VENTRAL HERNIA REPAIR;  Surgeon: Diamantina Monks, MD;  Location: [facility] OR;  Service: General;  Laterality: N/A;   Social History   Tobacco Use   Smoking status: Never   Smokeless tobacco: Never  Vaping Use   Vaping status: Never Used  Substance Use Topics   Alcohol use: Yes    Comment: rare beer- 3 beers per year   Drug use: Not Currently   Family History  Problem Relation Age of Onset   Diabetes Mother    Diabetes Maternal Aunt    Diabetes Maternal Grandmother    Stomach cancer Other    Heart disease Neg Hx    Colon cancer Neg Hx    Esophageal cancer Neg Hx    Rectal cancer Neg Hx    Colon polyps Neg Hx    Allergies  Allergen Reactions   Celebrex [Celecoxib] Itching   Penicillins Itching   Sulfonamide  Derivatives Itching      ROS Negative unless stated above    Objective:     BP 130/82 (BP Location: Right Arm, Patient Position: Sitting, Cuff Size: Large)   Pulse 75   Temp 98.1 F (36.7 C) (Oral)   Ht 5\' 11"  (1.803 m)   Wt 261 lb (118.4 kg)   SpO2 95%   BMI 36.40 kg/m  BP Readings from Last 3 Encounters:  05/23/23 130/82  05/20/23 138/80  05/13/23 138/88   Wt Readings from Last 3 Encounters:  05/23/23 261 lb (118.4 kg)  05/20/23 259 lb 12.8 oz (117.8 kg)  05/13/23 260 lb (117.9 kg)      Physical Exam Constitutional:      General: He is not in acute distress.    Appearance: Normal appearance.  HENT:     Head: Normocephalic and atraumatic.     Nose: Nose normal.     Mouth/Throat:     Mouth: Mucous membranes are moist.  Cardiovascular:     Rate and Rhythm: Normal rate and regular rhythm.     Heart sounds: Normal heart sounds. No murmur heard.    No gallop.  Pulmonary:     Effort: Pulmonary effort is normal. No respiratory distress.  Breath sounds: Normal breath sounds. No wheezing, rhonchi or rales.  Skin:    General: Skin is warm and dry.  Neurological:     Mental Status: He is alert and oriented to person, place, and time.    No results found for any visits on 05/23/23.    Assessment & Plan:  Iron deficiency anemia, unspecified iron deficiency anemia type -     Ambulatory referral to Gastroenterology -     CBC with Differential/Platelet -     Ferrous Gluconate; Take 1 tablet (324 mg total) by mouth daily.  Dispense: 90 tablet; Refill: 1  Abnormal iron studies ordered 05/13/2023 with endocrinology.  No CBC was obtained.  Colonoscopy 01/2023 normal.  Per notes if patient continued to have iron deficiency EGD recommended.  Referral placed for patient to see Dr. Rhea Belton, GI.    Update: Referral for iron infusion canceled as hemoglobin this visit 13.2 and hematocrit 42.7.  MCV 65.5.  Start p.o. iron and increase intake of iron rich foods.  Recheck iron  levels and CBC in 3 to 4 weeks.  Consider infusion if needed at that time.  Return if symptoms worsen or fail to improve, for physical already scheduled.Deeann Saint, MD

## 2023-05-23 NOTE — Patient Instructions (Signed)
A referral was placed for you to have your iron infusion.  A referral was also placed for you to see Dr. Rhea Belton again for an EGD to further evaluate the iron deficiency anemia.

## 2023-05-23 NOTE — Telephone Encounter (Signed)
Renewed.

## 2023-05-23 NOTE — Telephone Encounter (Signed)
Dr. Salomon Fick, patient will be scheduled as soon as possible  Auth Submission: NO AUTH NEEDED Site of care: Site of care: [facility] Payer: BCBS  Medication & CPT/J Code(s) submitted: Venofer (Iron Sucrose) J1756 Route of submission (phone, fax, portal):  Phone # Fax # Auth type: Buy/Bill PB Units/visits requested: 200mg  x 5 doses Reference number:  Approval from: 05/23/23 to 07/30/23

## 2023-05-24 ENCOUNTER — Encounter: Payer: Self-pay | Admitting: Endocrinology

## 2023-05-24 ENCOUNTER — Other Ambulatory Visit: Payer: Self-pay

## 2023-05-24 DIAGNOSIS — E291 Testicular hypofunction: Secondary | ICD-10-CM

## 2023-05-24 MED ORDER — JATENZO 237 MG PO CAPS: ORAL_CAPSULE | ORAL | 2 refills | Status: DC

## 2023-05-24 NOTE — Telephone Encounter (Signed)
Refilled

## 2023-06-05 LAB — HM DIABETES EYE EXAM

## 2023-06-17 ENCOUNTER — Telehealth: Payer: Self-pay | Admitting: Family Medicine

## 2023-06-17 NOTE — Telephone Encounter (Signed)
[facility] called on behalf of the Pt to say Pt never got his refill back in October.  Pharmacy is wondering if MD could please send the refill, as soon as possible, as Pt has been out of this Rx for over a month.

## 2023-06-18 ENCOUNTER — Other Ambulatory Visit: Payer: Self-pay

## 2023-06-18 ENCOUNTER — Encounter: Payer: Self-pay | Admitting: Endocrinology

## 2023-06-18 DIAGNOSIS — I10 Essential (primary) hypertension: Secondary | ICD-10-CM

## 2023-06-18 MED ORDER — LOSARTAN POTASSIUM-HCTZ 50-12.5 MG PO TABS: 1.0000 | ORAL_TABLET | Freq: Every day | ORAL | 0 refills | Status: DC

## 2023-06-18 MED ORDER — SIMVASTATIN 20 MG PO TABS: 20.0000 mg | ORAL_TABLET | Freq: Every day | ORAL | 4 refills | Status: DC

## 2023-06-18 MED ORDER — SIMVASTATIN 20 MG PO TABS: 20.0000 mg | ORAL_TABLET | Freq: Every day | ORAL | 0 refills | Status: DC

## 2023-06-18 NOTE — Telephone Encounter (Signed)
Called and spoke with patient sending the simvastatin to [facility], and also sending 30 days to [facility] [address] [place], because patient is out of medication

## 2023-06-24 ENCOUNTER — Encounter: Payer: Self-pay | Admitting: Family Medicine

## 2023-06-24 ENCOUNTER — Ambulatory Visit (INDEPENDENT_AMBULATORY_CARE_PROVIDER_SITE_OTHER): Payer: BC Managed Care – PPO | Admitting: Family Medicine

## 2023-06-24 VITALS — BP 132/80 | HR 78 | Temp 98.2°F | Ht 70.5 in | Wt 259.0 lb

## 2023-06-24 DIAGNOSIS — I10 Essential (primary) hypertension: Secondary | ICD-10-CM

## 2023-06-24 DIAGNOSIS — E782 Mixed hyperlipidemia: Secondary | ICD-10-CM

## 2023-06-24 DIAGNOSIS — D563 Thalassemia minor: Secondary | ICD-10-CM

## 2023-06-24 DIAGNOSIS — D509 Iron deficiency anemia, unspecified: Secondary | ICD-10-CM

## 2023-06-24 DIAGNOSIS — Z0000 Encounter for general adult medical examination without abnormal findings: Secondary | ICD-10-CM

## 2023-06-24 DIAGNOSIS — Z125 Encounter for screening for malignant neoplasm of prostate: Secondary | ICD-10-CM

## 2023-06-24 LAB — CBC WITH DIFFERENTIAL/PLATELET
Basophils Absolute: 0.1 10*3/uL (ref 0.0–0.1)
Basophils Relative: 1.2 % (ref 0.0–3.0)
Eosinophils Absolute: 0.1 10*3/uL (ref 0.0–0.7)
Eosinophils Relative: 2.8 % (ref 0.0–5.0)
HCT: 43.1 % (ref 39.0–52.0)
Hemoglobin: 13.4 g/dL (ref 13.0–17.0)
Lymphocytes Relative: 44 % (ref 12.0–46.0)
Lymphs Abs: 2.1 10*3/uL (ref 0.7–4.0)
MCHC: 31.2 g/dL (ref 30.0–36.0)
MCV: 67.6 fL — ABNORMAL LOW (ref 78.0–100.0)
Monocytes Absolute: 0.6 10*3/uL (ref 0.1–1.0)
Monocytes Relative: 11.6 % (ref 3.0–12.0)
Neutro Abs: 2 10*3/uL (ref 1.4–7.7)
Neutrophils Relative %: 40.4 % — ABNORMAL LOW (ref 43.0–77.0)
Platelets: 264 10*3/uL (ref 150.0–400.0)
RBC: 6.37 Mil/uL — ABNORMAL HIGH (ref 4.22–5.81)
RDW: 20.6 % — ABNORMAL HIGH (ref 11.5–15.5)
WBC: 4.9 10*3/uL (ref 4.0–10.5)

## 2023-06-24 LAB — LIPID PANEL
Cholesterol: 141 mg/dL (ref 0–200)
HDL: 32 mg/dL — ABNORMAL LOW (ref >39.00)
LDL Cholesterol: 96 mg/dL (ref 0–99)
NonHDL: 109.22
Total CHOL/HDL Ratio: 4
Triglycerides: 68 mg/dL (ref 0.0–149.0)
VLDL: 13.6 mg/dL (ref 0.0–40.0)

## 2023-06-24 LAB — TSH: TSH: 3.21 u[IU]/mL (ref 0.35–5.50)

## 2023-06-24 MED ORDER — SIMVASTATIN 20 MG PO TABS: 20.0000 mg | ORAL_TABLET | Freq: Every day | ORAL | 3 refills | Status: AC

## 2023-06-24 NOTE — Progress Notes (Signed)
Established Patient Office Visit   Subjective  Patient ID: Clifford Kramer, male    DOB: 06/13/1966  Age: 57 y.o. MRN: [id number]  Chief Complaint  Patient presents with   Annual Exam    Patient is a 57 year old male seen for CPE.  Patient states he has been doing well.  Taking iron supplement daily given recent anemia.  Also taking MiraLAX to help constipation.  Has appointment in January with GI PA as was unable to get in with Dr. Rhea Belton.  Patient advised to schedule appointment as EGD mention during colonoscopy 02/25/2023 for continued issues with anemia.  Pt no longer with dizziness, SOB.  Eating less ice.  Working on weight loss.  Eating small portions.  Has cut down on alcohol intake significantly.  States weight at home 249 lbs. followed by endocrinology for DM2.  Had eye exam at [facility].    Patient Active Problem List   Diagnosis Date Noted   Iron deficiency anemia 05/23/2023   History of colonic polyps 02/25/2023   Obesity 11/21/2020   Posttraumatic stress disorder 11/21/2020   Snoring 11/21/2020   Routine general medical examination at a health care facility 10/01/2017   Male hypogonadism 01/16/2016   Type II diabetes mellitus, uncontrolled 03/04/2013   Dyslipidemia (high LDL; low HDL) 03/04/2013   Alpha trait thalassemia 07/30/2012   Hyperlipidemia 07/30/2012   Gout 08/17/2010   Obstructive sleep apnea 12/07/2009   Hypersomnia with sleep 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   ERECTILE DYSFUNCTION 03/07/2007   Essential hypertension 03/07/2007   ALLERGIC RHINITIS 03/07/2007   Type 2 or unspecified type diabetes mellitus, uncontrolled 07/30/2006   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   GERD (gastroesophageal reflux disease)    Gout    Hyperlipidemia    Hypertension    RA (rheumatoid arthritis) (HCC)    Sleep apnea    CPAP use    Past Surgical History:  Procedure Laterality Date   COLONOSCOPY   2015   INSERTION OF MESH N/A 10/20/2021   Procedure: INSERTION OF MESH;  Surgeon: Diamantina Monks, MD;  Location: [facility];  Service: General;  Laterality: N/A;   KNEE SURGERY Left 2005   VENTRAL HERNIA REPAIR N/A 10/20/2021   Procedure: LAPAROSCOPIC VENTRAL HERNIA REPAIR;  Surgeon: Diamantina Monks, MD;  Location: [facility];  Service: General;  Laterality: N/A;   Social History   Tobacco Use   Smoking status: Never   Smokeless tobacco: Never  Vaping Use   Vaping status: Never Used  Substance Use Topics   Alcohol use: Yes    Comment: rare beer- 3 beers per year   Drug use: Not Currently   Family History  Problem Relation Age of Onset   Diabetes Mother    Diabetes Maternal Aunt    Diabetes Maternal Grandmother    Stomach cancer Other    Heart disease Neg Hx    Colon cancer Neg Hx    Esophageal cancer Neg Hx    Rectal cancer Neg Hx    Colon polyps Neg Hx    Allergies  Allergen Reactions   Celebrex [Celecoxib] Itching   Penicillins Itching   Sulfonamide Derivatives Itching      ROS Negative unless stated above    Objective:     BP 132/80   Pulse 78   Temp 98.2 F (36.8 C) (Oral)   Ht 5' 10.5" (1.791 m)   Wt 259  lb (117.5 kg)   SpO2 97%   BMI 36.64 kg/m    Physical Exam Constitutional:      Appearance: Normal appearance.  HENT:     Head: Normocephalic and atraumatic.     Right Ear: Tympanic membrane, ear canal and external ear normal.     Left Ear: Tympanic membrane, ear canal and external ear normal.     Nose: Nose normal.     Mouth/Throat:     Mouth: Mucous membranes are moist.     Pharynx: No oropharyngeal exudate or posterior oropharyngeal erythema.  Eyes:     General: No scleral icterus.    Extraocular Movements: Extraocular movements intact.     Conjunctiva/sclera: Conjunctivae normal.     Pupils: Pupils are equal, round, and reactive to light.  Neck:     Thyroid: No thyromegaly.  Cardiovascular:     Rate and Rhythm: Normal rate and regular  rhythm.     Pulses: Normal pulses.     Heart sounds: Normal heart sounds. No murmur heard.    No friction rub.  Pulmonary:     Effort: Pulmonary effort is normal.     Breath sounds: Normal breath sounds. No wheezing, rhonchi or rales.  Abdominal:     General: Bowel sounds are normal.     Palpations: Abdomen is soft.     Tenderness: There is abdominal tenderness in the left upper quadrant and left lower quadrant.     Comments: Stool palpable in left lower quadrant.  Musculoskeletal:        General: No deformity. Normal range of motion.  Lymphadenopathy:     Cervical: No cervical adenopathy.  Skin:    General: Skin is warm and dry.     Findings: No lesion.  Neurological:     General: No focal deficit present.     Mental Status: He is alert and oriented to person, place, and time.  Psychiatric:        Mood and Affect: Mood normal.        Thought Content: Thought content normal.      No results found for any visits on 06/24/23.    Assessment & Plan:  Well adult exam -Age-appropriate health screenings discussed -Obtain labs.  Recent labs from 05/23/2023 reviewed. -Immunizations reviewed and up-to-date. -Colonoscopy done 02/25/2023 -Next CPE in 1 year  Screening for prostate cancer -     PSA  Mixed hyperlipidemia -Continue simvastatin 20 mg daily -     Lipid panel -     Simvastatin 20 mg  Essential hypertension -Controlled -Continue losartan-hydrochlorothiazide 50-12.5 mg daily, Norvasc 2.5 mg daily -Continue lifestyle modifications -Monitor BP at home for elevations consistently greater than 140/90 notify clinic -     TSH  Iron deficiency anemia, unspecified iron deficiency anemia type -Abnormal iron studies 05/13/2023 with Endo -Continue OTC iron supplement daily -Iron infusion referral initially placed 05/23/2023 but not done as Hgb 13.2, hct 42.7, and MCV 65.5 during that visit. -Recheck iron levels.  If needed based on labs proceed with iron infusion referral  versus continuing OTC supplementation. -Encouraged to keep follow-up with GI, Dr. Rhea Belton regarding EGD. -     Iron, TIBC and Ferritin Panel -     CBC with Differential/Platelet  Alpha trait thalassemia -     Iron, TIBC and Ferritin Panel -     CBC with Differential/Platelet    Return in about 8 weeks (around 08/19/2023), or if symptoms worsen or fail to improve.   Bettey Mare  Salomon Fick, MD

## 2023-06-25 ENCOUNTER — Other Ambulatory Visit (INDEPENDENT_AMBULATORY_CARE_PROVIDER_SITE_OTHER): Payer: BC Managed Care – PPO

## 2023-06-25 DIAGNOSIS — Z125 Encounter for screening for malignant neoplasm of prostate: Secondary | ICD-10-CM

## 2023-06-25 LAB — IRON,TIBC AND FERRITIN PANEL
%SAT: 6 % — ABNORMAL LOW (ref 20–48)
Ferritin: 7 ng/mL — ABNORMAL LOW (ref 38–380)
Iron: 28 ug/dL — ABNORMAL LOW (ref 50–180)
TIBC: 494 ug/dL — ABNORMAL HIGH (ref 250–425)

## 2023-06-25 LAB — PSA: PSA: 0.32 ng/mL (ref 0.10–4.00)

## 2023-07-17 ENCOUNTER — Telehealth: Payer: Self-pay

## 2023-07-17 ENCOUNTER — Other Ambulatory Visit: Payer: Self-pay

## 2023-07-17 NOTE — Telephone Encounter (Signed)
Auth Submission: NO AUTH NEEDED Site of care: Site of care: [facility] Payer: [employer] [place] Medication & CPT/J Code(s) submitted: Venofer (Iron Sucrose) J1756 Route of submission (phone, fax, portal): phone Phone # [phone] Fax # Auth type: Buy/Bill PB Units/visits requested: 200mg  x 5 doses Reference number: [id number] Approval from: 07/17/23 to 07/30/23   Confirmed by calling [phone], J1756 with diagnosis code D50.9, does not need a prior authorization for this patient. Ref #[id number]

## 2023-07-22 NOTE — Telephone Encounter (Signed)
Removed addressed Eye Exam documentation and abstracted results per Care Everywhere documentation from the [place].

## 2023-07-30 ENCOUNTER — Encounter: Payer: Self-pay | Admitting: Physician Assistant

## 2023-07-30 ENCOUNTER — Ambulatory Visit: Payer: BC Managed Care – PPO | Admitting: *Deleted

## 2023-07-30 VITALS — BP 119/80 | HR 85 | Temp 98.0°F | Resp 16 | Ht 70.5 in | Wt 259.0 lb

## 2023-07-30 DIAGNOSIS — D509 Iron deficiency anemia, unspecified: Secondary | ICD-10-CM | POA: Diagnosis not present

## 2023-07-30 LAB — LAB REPORT - SCANNED: EGFR: 57

## 2023-07-30 MED ORDER — DIPHENHYDRAMINE HCL 25 MG PO CAPS
25.0000 mg | ORAL_CAPSULE | Freq: Once | ORAL | Status: AC
  Administered 2023-07-30: 25 mg via ORAL
  Filled 2023-07-30: qty 1

## 2023-07-30 MED ORDER — IRON SUCROSE 20 MG/ML IV SOLN
200.0000 mg | Freq: Once | INTRAVENOUS | Status: AC
  Administered 2023-07-30: 200 mg via INTRAVENOUS
  Filled 2023-07-30: qty 10

## 2023-07-30 MED ORDER — ACETAMINOPHEN 325 MG PO TABS
650.0000 mg | ORAL_TABLET | Freq: Once | ORAL | Status: AC
Start: 2023-07-30 — End: 2023-07-30
  Administered 2023-07-30: 650 mg via ORAL
  Filled 2023-07-30: qty 2

## 2023-07-30 NOTE — Patient Instructions (Signed)
 Iron Sucrose Injection What is this medication? IRON SUCROSE (EYE ern SOO krose) treats low levels of iron (iron deficiency anemia) in people with kidney disease. Iron is a mineral that plays an important role in making red blood cells, which carry oxygen from your lungs to the rest of your body. This medicine may be used for other purposes; ask your health care provider or pharmacist if you have questions. COMMON BRAND NAME(S): Venofer What should I tell my care team before I take this medication? They need to know if you have any of these conditions: Anemia not caused by low iron levels Heart disease High levels of iron in the blood Kidney disease Liver disease An unusual or allergic reaction to iron, other medications, foods, dyes, or preservatives Pregnant or trying to get pregnant Breastfeeding How should I use this medication? This medication is for infusion into a vein. It is given in a hospital or clinic setting. Talk to your care team about the use of this medication in children. While this medication may be prescribed for children as young as 2 years for selected conditions, precautions do apply. Overdosage: If you think you have taken too much of this medicine contact a poison control center or emergency room at once. NOTE: This medicine is only for you. Do not share this medicine with others. What if I miss a dose? Keep appointments for follow-up doses. It is important not to miss your dose. Call your care team if you are unable to keep an appointment. What may interact with this medication? Do not take this medication with any of the following: Deferoxamine Dimercaprol Other iron products This medication may also interact with the following: Chloramphenicol Deferasirox This list may not describe all possible interactions. Give your health care provider a list of all the medicines, herbs, non-prescription drugs, or dietary supplements you use. Also tell them if you smoke,  drink alcohol, or use illegal drugs. Some items may interact with your medicine. What should I watch for while using this medication? Visit your care team regularly. Tell your care team if your symptoms do not start to get better or if they get worse. You may need blood work done while you are taking this medication. You may need to follow a special diet. Talk to your care team. Foods that contain iron include: whole grains/cereals, dried fruits, beans, or peas, leafy green vegetables, and organ meats (liver, kidney). What side effects may I notice from receiving this medication? Side effects that you should report to your care team as soon as possible: Allergic reactions--skin rash, itching, hives, swelling of the face, lips, tongue, or throat Low blood pressure--dizziness, feeling faint or lightheaded, blurry vision Shortness of breath Side effects that usually do not require medical attention (report to your care team if they continue or are bothersome): Flushing Headache Joint pain Muscle pain Nausea Pain, redness, or irritation at injection site This list may not describe all possible side effects. Call your doctor for medical advice about side effects. You may report side effects to FDA at 1-800-FDA-1088. Where should I keep my medication? This medication is given in a hospital or clinic. It will not be stored at home. NOTE: This sheet is a summary. It may not cover all possible information. If you have questions about this medicine, talk to your doctor, pharmacist, or health care provider.  2024 Elsevier/Gold Standard (2022-12-21 00:00:00)

## 2023-07-30 NOTE — Progress Notes (Signed)
 Diagnosis: Iron  Deficiency Anemia  Provider:  Mannam, Praveen MD  Procedure: IV Push  IV Type: Peripheral, IV Location: L Upper Arm  Venofer  (Iron  Sucrose), Dose: 200 mg  Post Infusion IV Care: Observation period completed and Peripheral IV Discontinued  Discharge: Condition: Good, Destination: Home . AVS Provided  Performed by:  Trudy Lamarr LABOR, RN

## 2023-08-01 ENCOUNTER — Ambulatory Visit: Payer: BC Managed Care – PPO

## 2023-08-02 ENCOUNTER — Ambulatory Visit: Payer: BC Managed Care – PPO

## 2023-08-02 VITALS — BP 129/78 | HR 80 | Temp 98.1°F | Resp 18 | Ht 70.0 in | Wt 257.2 lb

## 2023-08-02 DIAGNOSIS — D509 Iron deficiency anemia, unspecified: Secondary | ICD-10-CM | POA: Diagnosis not present

## 2023-08-02 MED ORDER — DIPHENHYDRAMINE HCL 25 MG PO CAPS
25.0000 mg | ORAL_CAPSULE | Freq: Once | ORAL | Status: AC
Start: 2023-08-02 — End: 2023-08-02
  Administered 2023-08-02: 25 mg via ORAL
  Filled 2023-08-02: qty 1

## 2023-08-02 MED ORDER — ACETAMINOPHEN 325 MG PO TABS
650.0000 mg | ORAL_TABLET | Freq: Once | ORAL | Status: AC
  Administered 2023-08-02: 650 mg via ORAL
  Filled 2023-08-02: qty 2

## 2023-08-02 MED ORDER — IRON SUCROSE 20 MG/ML IV SOLN
200.0000 mg | Freq: Once | INTRAVENOUS | Status: AC
  Administered 2023-08-02: 200 mg via INTRAVENOUS
  Filled 2023-08-02: qty 10

## 2023-08-02 NOTE — Progress Notes (Signed)
 Diagnosis: Iron Deficiency Anemia  Provider:  Praveen Mannam MD  Procedure: IV Push  IV Type: Peripheral, IV Location: L Antecubital  Venofer (Iron Sucrose), Dose: 200 mg  Post Infusion IV Care: Observation period completed and Peripheral IV Discontinued  Discharge: Condition: Good, Destination: Home . AVS Declined  Performed by:  Surya Schroeter, RN

## 2023-08-06 ENCOUNTER — Ambulatory Visit: Payer: BC Managed Care – PPO

## 2023-08-06 VITALS — BP 131/80 | HR 82 | Temp 97.9°F | Resp 16 | Ht 70.0 in | Wt 264.6 lb

## 2023-08-06 DIAGNOSIS — D509 Iron deficiency anemia, unspecified: Secondary | ICD-10-CM | POA: Diagnosis not present

## 2023-08-06 MED ORDER — IRON SUCROSE 20 MG/ML IV SOLN
200.0000 mg | Freq: Once | INTRAVENOUS | Status: AC
  Administered 2023-08-06: 200 mg via INTRAVENOUS
  Filled 2023-08-06: qty 10

## 2023-08-06 MED ORDER — DIPHENHYDRAMINE HCL 25 MG PO CAPS
25.0000 mg | ORAL_CAPSULE | Freq: Once | ORAL | Status: AC
  Administered 2023-08-06: 25 mg via ORAL
  Filled 2023-08-06: qty 1

## 2023-08-06 MED ORDER — ACETAMINOPHEN 325 MG PO TABS
650.0000 mg | ORAL_TABLET | Freq: Once | ORAL | Status: AC
  Administered 2023-08-06: 650 mg via ORAL
  Filled 2023-08-06: qty 2

## 2023-08-06 NOTE — Progress Notes (Signed)
 Diagnosis: Iron Deficiency Anemia  Provider:  Chilton Greathouse MD  Procedure: IV Push  IV Type: Peripheral, IV Location: L Forearm  Venofer (Iron Sucrose), Dose: 200 mg  Post Infusion IV Care: Observation period completed and Peripheral IV Discontinued  Discharge: Condition: Good, Destination: Home . AVS Declined  Performed by:  Rico Ala, LPN

## 2023-08-07 ENCOUNTER — Other Ambulatory Visit: Payer: Self-pay | Admitting: Family Medicine

## 2023-08-07 ENCOUNTER — Other Ambulatory Visit: Payer: Self-pay

## 2023-08-07 DIAGNOSIS — E1165 Type 2 diabetes mellitus with hyperglycemia: Secondary | ICD-10-CM

## 2023-08-07 DIAGNOSIS — E291 Testicular hypofunction: Secondary | ICD-10-CM

## 2023-08-08 ENCOUNTER — Ambulatory Visit (INDEPENDENT_AMBULATORY_CARE_PROVIDER_SITE_OTHER): Payer: BC Managed Care – PPO | Admitting: Gastroenterology

## 2023-08-08 ENCOUNTER — Encounter: Payer: Self-pay | Admitting: Gastroenterology

## 2023-08-08 ENCOUNTER — Ambulatory Visit (INDEPENDENT_AMBULATORY_CARE_PROVIDER_SITE_OTHER): Payer: BC Managed Care – PPO

## 2023-08-08 ENCOUNTER — Encounter: Payer: Self-pay | Admitting: Internal Medicine

## 2023-08-08 VITALS — BP 138/78 | HR 72 | Ht 70.0 in | Wt 260.1 lb

## 2023-08-08 VITALS — BP 113/70 | HR 79 | Temp 98.0°F | Resp 14 | Ht 70.5 in | Wt 261.4 lb

## 2023-08-08 DIAGNOSIS — D509 Iron deficiency anemia, unspecified: Secondary | ICD-10-CM | POA: Diagnosis not present

## 2023-08-08 DIAGNOSIS — E611 Iron deficiency: Secondary | ICD-10-CM | POA: Diagnosis not present

## 2023-08-08 MED ORDER — ACETAMINOPHEN 325 MG PO TABS
650.0000 mg | ORAL_TABLET | Freq: Once | ORAL | Status: AC
  Administered 2023-08-08: 650 mg via ORAL
  Filled 2023-08-08: qty 2

## 2023-08-08 MED ORDER — IRON SUCROSE 20 MG/ML IV SOLN
200.0000 mg | Freq: Once | INTRAVENOUS | Status: AC
  Administered 2023-08-08: 200 mg via INTRAVENOUS
  Filled 2023-08-08: qty 10

## 2023-08-08 MED ORDER — DIPHENHYDRAMINE HCL 25 MG PO CAPS
25.0000 mg | ORAL_CAPSULE | Freq: Once | ORAL | Status: AC
  Administered 2023-08-08: 25 mg via ORAL
  Filled 2023-08-08: qty 1

## 2023-08-08 NOTE — Progress Notes (Signed)
 Diagnosis: Iron Deficiency Anemia  Provider:  Chilton Greathouse MD  Procedure: IV Push  IV Type: Peripheral, IV Location: R Antecubital  Venofer (Iron Sucrose), Dose: 200 mg  Post Infusion IV Care: Observation period completed and Peripheral IV Discontinued  Discharge: Condition: Good, Destination: Home . AVS Declined  Performed by:  Rico Ala, LPN

## 2023-08-08 NOTE — Progress Notes (Signed)
 08/08/2023 Clifford Kramer [id number] [birth year]   HISTORY OF PRESENT ILLNESS:  This is a 58 year old male who is a patient of Dr. Chrystie.  He just had a colonoscopy in 01/2023 that showed only diverticulosis.  He has never had an EGD in the past.  Is here today for evaluation of iron  deficiency.  Hgb is within normal range, around 13 grams but iron  levels were low.  He is receiving IV iron  infusions.  Does not see any red blood in his stool.  Says that he has seen a couple of dark/black stools randomly but did not necessarily think anything about it when they occurred.  No other complaints.  No NSAID use.  Was taking 81 mg ASA daily but has stopped that a couple of months ago when all of this iron  deficiency issue began.  Has been taking a PO iron  supplement as well.  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   GERD (gastroesophageal reflux disease)    Gout    Hyperlipidemia    Hypertension    RA (rheumatoid arthritis) (HCC)    Sleep apnea    CPAP use    Past Surgical History:  Procedure Laterality Date   COLONOSCOPY  2015   INSERTION OF MESH N/A 10/20/2021   Procedure: INSERTION OF MESH;  Surgeon: Paola Dreama SAILOR, MD;  Location: [facility] OR;  Service: General;  Laterality: N/A;   KNEE SURGERY Left 2005   VENTRAL HERNIA REPAIR N/A 10/20/2021   Procedure: LAPAROSCOPIC VENTRAL HERNIA REPAIR;  Surgeon: Paola Dreama SAILOR, MD;  Location: [facility] OR;  Service: General;  Laterality: N/A;    reports that he has never smoked. He has never used smokeless tobacco. He reports current alcohol use. He reports that he does not currently use drugs. family history includes Diabetes in his maternal aunt, maternal grandmother, and mother; Stomach cancer in an other family member. Allergies  Allergen Reactions   Celebrex [Celecoxib] Itching   Penicillins Itching   Sulfonamide Derivatives Itching      Outpatient Encounter Medications  as of 08/08/2023  Medication Sig   allopurinol  (ZYLOPRIM ) 300 MG tablet TAKE 1 TABLET DAILY   amLODipine  (NORVASC ) 2.5 MG tablet Take 1 tablet (2.5 mg total) by mouth daily.   BD PEN NEEDLE NANO U/F 32G X 4 MM MISC USE EVERY DAY   Blood Glucose Monitoring Suppl (ACCU-CHEK GUIDE) w/Device KIT 1 each by Does not apply route 3 (three) times daily. Use accu chek guide device to check blood sugar twice daily. DX:E11.9   Empagliflozin -metFORMIN  HCl ER (SYNJARDY  XR) 12.11-998 MG TB24 Take 2 tablets by mouth daily with breakfast.   ENBREL SURECLICK 50 MG/ML injection Inject 50 mg into the skin every Monday.   ferrous gluconate  (FERGON) 324 MG tablet Take 1 tablet (324 mg total) by mouth daily.   fluticasone  (FLONASE ) 50 MCG/ACT nasal spray PLACE 2 SPRAYS INTO THE NOSE DAILY. (Patient taking differently: Place 2 sprays into both nostrils daily as needed for allergies.)   glimepiride  (AMARYL ) 1 MG tablet TAKE 1 TABLET BY MOUTH EVERY DAY WITH BREAKFAST   glucose blood test strip Use as instructed   hydroxychloroquine (PLAQUENIL) 200 MG tablet Take 200 mg by mouth 2 (two) times daily.   losartan -hydrochlorothiazide  (HYZAAR) 50-12.5 MG tablet Take 1 tablet by mouth daily.   omeprazole  (PRILOSEC) 40 MG capsule TAKE 1 CAPSULE DAILY   OneTouch Delica Lancets  33G MISC Check blood sugar 2 times daily   OZEMPIC , 2 MG/DOSE, 8 MG/3ML SOPN INJECT 2 MG WEEKLY FOR DIABETES   simvastatin  (ZOCOR ) 20 MG tablet Take 1 tablet (20 mg total) by mouth at bedtime.   Testosterone  Undecanoate (JATENZO ) 237 MG CAPS 1 capsule twice daily with food   vardenafil  (LEVITRA ) 20 MG tablet Take by mouth.   VIAGRA 100 MG tablet Take 100 mg by mouth daily as needed for erectile dysfunction.   loratadine  (CLARITIN ) 10 MG tablet Take 1 tablet (10 mg total) by mouth daily. (Patient not taking: Reported on 08/08/2023)   [DISCONTINUED] aspirin 81 MG tablet Take 81 mg by mouth daily.   Facility-Administered Encounter Medications as of 08/08/2023   Medication   acetaminophen  (TYLENOL ) tablet 650 mg   diphenhydrAMINE  (BENADRYL ) capsule 25 mg    REVIEW OF SYSTEMS  : All other systems reviewed and negative except where noted in the History of Present Illness.   PHYSICAL EXAM: BP 138/78 (BP Location: Left Arm, Patient Position: Sitting)   Pulse 72   Ht 5' 10 (1.778 m)   Wt 260 lb 2 oz (118 kg)   SpO2 100%   BMI 37.32 kg/m  General: Well developed AA male in no acute distress Head: Normocephalic and atraumatic Eyes:  Sclerae anicteric, conjunctiva pink. Ears: Normal auditory acuity Lungs: Clear throughout to auscultation; no W/R/R. Heart: Regular rate and rhythm; no M/R/G. Musculoskeletal: Symmetrical with no gross deformities  Skin: No lesions on visible extremities Neurological: Alert oriented x 4, grossly non-focal Psychological:  Alert and cooperative. Normal mood and affect  ASSESSMENT AND PLAN: *Iron  deficiency:  Hgb ok in the 13 gram range.  Iron  levels low.  Receiving IV iron  infusions.  Just had a colonoscopy in July 2024.  Says that he has seen a couple of black stools randomly.  Will plan for EGD with Dr. Albertus.  The risks, benefits, and alternatives to EGD were discussed with the patient and he consents to proceed.   CC:  Mercer Clotilda SAUNDERS, MD

## 2023-08-08 NOTE — Patient Instructions (Signed)
 You have been scheduled for an endoscopy. Please follow written instructions given to you at your visit today.  If you use inhalers (even only as needed), please bring them with you on the day of your procedure.  If you take any of the following medications, they will need to be adjusted prior to your procedure:   DO NOT TAKE 7 DAYS PRIOR TO TEST- Trulicity (dulaglutide) Ozempic , Wegovy  (semaglutide ) Mounjaro (tirzepatide) Bydureon Bcise (exanatide extended release)  DO NOT TAKE 1 DAY PRIOR TO YOUR TEST Rybelsus  (semaglutide ) Adlyxin (lixisenatide) Victoza  (liraglutide ) Byetta (exanatide) __________________________________________________________________  _______________________________________________________  If your blood pressure at your visit was 140/90 or greater, please contact your primary care physician to follow up on this.  _______________________________________________________  If you are age 68 or older, your body mass index should be between 23-30. Your Body mass index is 37.32 kg/m. If this is out of the aforementioned range listed, please consider follow up with your Primary Care Provider.  If you are age 59 or younger, your body mass index should be between 19-25. Your Body mass index is 37.32 kg/m. If this is out of the aformentioned range listed, please consider follow up with your Primary Care Provider.   ________________________________________________________  The [facility] providers would like to encourage you to use MYCHART to communicate with providers for non-urgent requests or questions.  Due to long hold times on the telephone, sending your provider a message by [facility] may be a faster and more efficient way to get a response.  Please allow 48 business hours for a response.  Please remember that this is for non-urgent requests.  _______________________________________________________

## 2023-08-09 NOTE — Progress Notes (Signed)
 Addendum: Reviewed and agree with assessment and management plan. Asha Grumbine, Carie Caddy, MD

## 2023-08-12 ENCOUNTER — Ambulatory Visit: Payer: BC Managed Care – PPO

## 2023-08-12 VITALS — BP 134/87 | HR 86 | Temp 97.8°F | Resp 18 | Ht 70.0 in | Wt 262.8 lb

## 2023-08-12 DIAGNOSIS — D509 Iron deficiency anemia, unspecified: Secondary | ICD-10-CM

## 2023-08-12 MED ORDER — IRON SUCROSE 20 MG/ML IV SOLN
200.0000 mg | Freq: Once | INTRAVENOUS | Status: AC
  Administered 2023-08-12: 200 mg via INTRAVENOUS
  Filled 2023-08-12: qty 10

## 2023-08-12 MED ORDER — ACETAMINOPHEN 325 MG PO TABS
650.0000 mg | ORAL_TABLET | Freq: Once | ORAL | Status: AC
  Administered 2023-08-12: 650 mg via ORAL
  Filled 2023-08-12: qty 2

## 2023-08-12 MED ORDER — DIPHENHYDRAMINE HCL 25 MG PO CAPS
25.0000 mg | ORAL_CAPSULE | Freq: Once | ORAL | Status: AC
  Administered 2023-08-12: 25 mg via ORAL
  Filled 2023-08-12: qty 1

## 2023-08-12 NOTE — Progress Notes (Signed)
 Diagnosis: Iron Deficiency Anemia  Provider:  Praveen Mannam MD  Procedure: IV Push  IV Type: Peripheral, IV Location: L Forearm  Venofer (Iron Sucrose), Dose: 200 mg  Post Infusion IV Care: Observation period completed and Peripheral IV Discontinued  Discharge: Condition: Good, Destination: Home . AVS Declined  Performed by:  Cynthya Yam, RN

## 2023-08-13 ENCOUNTER — Ambulatory Visit (AMBULATORY_SURGERY_CENTER): Payer: BC Managed Care – PPO | Admitting: Internal Medicine

## 2023-08-13 ENCOUNTER — Encounter: Payer: Self-pay | Admitting: Internal Medicine

## 2023-08-13 ENCOUNTER — Other Ambulatory Visit: Payer: BC Managed Care – PPO

## 2023-08-13 VITALS — BP 124/81 | HR 78 | Temp 98.1°F | Resp 15 | Ht 70.0 in | Wt 260.0 lb

## 2023-08-13 DIAGNOSIS — K317 Polyp of stomach and duodenum: Secondary | ICD-10-CM | POA: Diagnosis not present

## 2023-08-13 DIAGNOSIS — K2950 Unspecified chronic gastritis without bleeding: Secondary | ICD-10-CM | POA: Diagnosis present

## 2023-08-13 DIAGNOSIS — K2900 Acute gastritis without bleeding: Secondary | ICD-10-CM

## 2023-08-13 DIAGNOSIS — B9681 Helicobacter pylori [H. pylori] as the cause of diseases classified elsewhere: Secondary | ICD-10-CM | POA: Diagnosis not present

## 2023-08-13 DIAGNOSIS — D509 Iron deficiency anemia, unspecified: Secondary | ICD-10-CM | POA: Diagnosis not present

## 2023-08-13 DIAGNOSIS — E611 Iron deficiency: Secondary | ICD-10-CM

## 2023-08-13 MED ORDER — SODIUM CHLORIDE 0.9 % IV SOLN: 500.0000 mL | Freq: Once | INTRAVENOUS | Status: DC

## 2023-08-13 NOTE — Progress Notes (Signed)
 See office note dated 08/08/2023  Patient presenting for upper endoscopy today to evaluate low iron anemia  He remains appropriate for EGD here today

## 2023-08-13 NOTE — Progress Notes (Signed)
 Called to room to assist during endoscopic procedure.  Patient ID and intended procedure confirmed with present staff. Received instructions for my participation in the procedure from the performing physician.

## 2023-08-13 NOTE — Progress Notes (Signed)
 Sedate, gd SR, tolerated procedure well, VSS, report to RN

## 2023-08-13 NOTE — Op Note (Signed)
 [facility] Patient Name: Clifford Kramer Procedure Date: 08/13/2023 1:31 PM MRN: [id number] Endoscopist: Gordy CHRISTELLA Starch , MD, [id number] Age: 58 Referring MD:  Date of Birth: 04-28-1966 Gender: Male Account #: [id number] Procedure:                Upper GI endoscopy Indications:              Iron  deficiency anemia Medicines:                Monitored Anesthesia Care Procedure:                Pre-Anesthesia Assessment:                           - Prior to the procedure, a History and Physical                            was performed, and patient medications and                            allergies were reviewed. The patient's tolerance of                            previous anesthesia was also reviewed. The risks                            and benefits of the procedure and the sedation                            options and risks were discussed with the patient.                            All questions were answered, and informed consent                            was obtained. Prior Anticoagulants: The patient has                            taken no anticoagulant or antiplatelet agents. ASA                            Grade Assessment: II - A patient with mild systemic                            disease. After reviewing the risks and benefits,                            the patient was deemed in satisfactory condition to                            undergo the procedure.                           After obtaining informed consent, the endoscope was  passed under direct vision. Throughout the                            procedure, the patient's blood pressure, pulse, and                            oxygen saturations were monitored continuously. The                            GIF [id number] #[id number] was introduced through the                            mouth, and advanced to the second part of duodenum.                            The upper GI endoscopy was  accomplished without                            difficulty. The patient tolerated the procedure                            well. Scope In: Scope Out: Findings:                 The examined esophagus was normal.                           Diffuse moderate inflammation characterized by                            congestion (edema), erythema, granularity and mucus                            was found in the entire examined stomach. Biopsies                            were taken with a cold forceps for histology and                            Helicobacter pylori testing.                           One 6 mm semi-pedunculated polyp was found in the                            gastric antrum. The polyp was removed with a hot                            snare. Resection and retrieval were complete.                           The examined duodenum was normal. Biopsies for                            histology were taken with a cold  forceps for                            evaluation of celiac disease. Complications:            No immediate complications. Estimated Blood Loss:     Estimated blood loss was minimal. Impression:               - Normal esophagus.                           - Moderate, diffuse, gastritis. Biopsied to exclude                            H. Pylori.                           - One gastric polyp. Resected and retrieved.                           - Normal examined duodenum. Biopsied. Recommendation:           - Patient has a contact number available for                            emergencies. The signs and symptoms of potential                            delayed complications were discussed with the                            patient. Return to normal activities tomorrow.                            Written discharge instructions were provided to the                            patient.                           - Resume previous diet.                           - Continue present  medications.                           - Await pathology results.                           - Monitor response to IV iron  replacement.                            Gastritis may explain low iron , will await H.                            Pylori and duodenal biopsy results. Gordy CHRISTELLA Starch, MD 08/13/2023 2:01:49 PM This report has been signed electronically.

## 2023-08-13 NOTE — Patient Instructions (Signed)
 Please read handouts provided. Continue present medications. Await pathology results. Monitor response to IV iron  replacement.   YOU HAD AN ENDOSCOPIC PROCEDURE TODAY AT THE [facility]:   Refer to the procedure report that was given to you for any specific questions about what was found during the examination.  If the procedure report does not answer your questions, please call your gastroenterologist to clarify.  If you requested that your care partner not be given the details of your procedure findings, then the procedure report has been included in a sealed envelope for you to review at your convenience later.  YOU SHOULD EXPECT: Some feelings of bloating in the abdomen. Passage of more gas than usual.  Walking can help get rid of the air that was put into your GI tract during the procedure and reduce the bloating. If you had a lower endoscopy (such as a colonoscopy or flexible sigmoidoscopy) you may notice spotting of blood in your stool or on the toilet paper. If you underwent a bowel prep for your procedure, you may not have a normal bowel movement for a few days.  Please Note:  You might notice some irritation and congestion in your nose or some drainage.  This is from the oxygen used during your procedure.  There is no need for concern and it should clear up in a day or so.  SYMPTOMS TO REPORT IMMEDIATELY:  Following upper endoscopy (EGD)  Vomiting of blood or coffee ground material  New chest pain or pain under the shoulder blades  Painful or persistently difficult swallowing  New shortness of breath  Fever of 100F or higher  Black, tarry-looking stools  For urgent or emergent issues, a gastroenterologist can be reached at any hour by calling [phone]. Do not use MyChart messaging for urgent concerns.    DIET:  We do recommend a small meal at first, but then you may proceed to your regular diet.  Drink plenty of fluids but you should avoid alcoholic beverages  for 24 hours.  ACTIVITY:  You should plan to take it easy for the rest of today and you should NOT DRIVE or use heavy machinery until tomorrow (because of the sedation medicines used during the test).    FOLLOW UP: Our staff will call the number listed on your records the next business day following your procedure.  We will call around 7:15- 8:00 am to check on you and address any questions or concerns that you may have regarding the information given to you following your procedure. If we do not reach you, we will leave a message.     If any biopsies were taken you will be contacted by phone or by letter within the next 1-3 weeks.  Please call us  at [phone] if you have not heard about the biopsies in 3 weeks.    SIGNATURES/CONFIDENTIALITY: You and/or your care partner have signed paperwork which will be entered into your electronic medical record.  These signatures attest to the fact that that the information above on your After Visit Summary has been reviewed and is understood.  Full responsibility of the confidentiality of this discharge information lies with you and/or your care-partner.

## 2023-08-14 ENCOUNTER — Telehealth: Payer: Self-pay | Admitting: *Deleted

## 2023-08-14 NOTE — Telephone Encounter (Signed)
 Post procedure call placed, busy line, tried several times.

## 2023-08-16 LAB — SURGICAL PATHOLOGY

## 2023-08-18 LAB — BASIC METABOLIC PANEL
BUN: 12 mg/dL (ref 7–25)
CO2: 27 mmol/L (ref 20–32)
Calcium: 9 mg/dL (ref 8.6–10.3)
Chloride: 104 mmol/L (ref 98–110)
Creat: 1.18 mg/dL (ref 0.70–1.30)
Glucose, Bld: 166 mg/dL — ABNORMAL HIGH (ref 65–99)
Potassium: 4.2 mmol/L (ref 3.5–5.3)
Sodium: 140 mmol/L (ref 135–146)

## 2023-08-18 LAB — TESTOSTERONE TOTAL,FREE,BIO, MALES
Albumin: 4.2 g/dL (ref 3.6–5.1)
Sex Hormone Binding: 13 nmol/L — ABNORMAL LOW (ref 22–77)
Testosterone: 219 ng/dL — ABNORMAL LOW (ref 250–827)

## 2023-08-18 LAB — PSA: PSA: 0.26 ng/mL (ref <=4.00)

## 2023-08-18 LAB — FRUCTOSAMINE: Fructosamine: 299 umol/L — ABNORMAL HIGH (ref 205–285)

## 2023-08-18 LAB — HEMOGLOBIN: Hemoglobin: 15 g/dL (ref 13.2–17.1)

## 2023-08-19 ENCOUNTER — Other Ambulatory Visit: Payer: Self-pay | Admitting: *Deleted

## 2023-08-20 ENCOUNTER — Other Ambulatory Visit: Payer: Self-pay

## 2023-08-20 ENCOUNTER — Ambulatory Visit (INDEPENDENT_AMBULATORY_CARE_PROVIDER_SITE_OTHER): Payer: BC Managed Care – PPO | Admitting: Endocrinology

## 2023-08-20 ENCOUNTER — Encounter: Payer: Self-pay | Admitting: Endocrinology

## 2023-08-20 VITALS — BP 122/88 | HR 92 | Ht 70.0 in | Wt 259.0 lb

## 2023-08-20 DIAGNOSIS — Z7985 Long-term (current) use of injectable non-insulin antidiabetic drugs: Secondary | ICD-10-CM | POA: Diagnosis not present

## 2023-08-20 DIAGNOSIS — Z7984 Long term (current) use of oral hypoglycemic drugs: Secondary | ICD-10-CM

## 2023-08-20 DIAGNOSIS — E291 Testicular hypofunction: Secondary | ICD-10-CM

## 2023-08-20 DIAGNOSIS — E1165 Type 2 diabetes mellitus with hyperglycemia: Secondary | ICD-10-CM | POA: Diagnosis not present

## 2023-08-20 LAB — POCT GLYCOSYLATED HEMOGLOBIN (HGB A1C): Hemoglobin A1C: 7.4 % — AB (ref 4.0–5.6)

## 2023-08-20 MED ORDER — GLIMEPIRIDE 2 MG PO TABS: ORAL_TABLET | ORAL | 3 refills | Status: DC

## 2023-08-20 MED ORDER — JATENZO 237 MG PO CAPS: ORAL_CAPSULE | ORAL | 1 refills | Status: DC

## 2023-08-20 NOTE — Progress Notes (Unsigned)
Outpatient Endocrinology Note [place] Chidera Thivierge, MD  08/21/23  Patient's Name: Clifford Kramer    DOB: Nov 21, 1965    MRN: [id number]                                                    REASON OF VISIT: Follow-up of type 2 diabetes mellitus  REFERRING PROVIDER:   PCP: Deeann Saint, MD  HISTORY OF PRESENT ILLNESS:   Clifford Kramer is a 58 y.o. old male with past medical history listed below, is here for follow up of type 2 diabetes mellitus.   Pertinent Diabetes History: Patient was diagnosed with type 2 diabetes mellitus in 2005.  Chronic Diabetes Complications : Retinopathy: no. Last ophthalmology exam was done on annually, at [place]. Nephropathy: CKD IIIa, on losartan Peripheral neuropathy: yes, follow with podiatry.  Coronary artery disease: no Stroke: no  Relevant comorbidities and cardiovascular risk factors: Obesity: yes Body mass index is 37.16 kg/m.  Hypertension: yes Hyperlipidemia. Yes, on statin.   Current / Home Diabetic regimen includes: Ozempic 2 mg weekly. Synjardy 12.5-1000mg  2 tab daily.  Amaryl 1mg  daily with breakfast.   Prior diabetic medications: Glipizide stopped due to ? hypoglycemia.  Glycemic data: Accu-Chek glucometer: Forgot to bring glucometer however he reported, glucometer reading: 164, highest, 116 lowest.   Hypoglycemia: Patient has no hypoglycemic episodes. Patient has hypoglycemia awareness.  Factors modifying glucose control: 1.  Diabetic diet assessment: 3 meals a day.  2.  Staying active or exercising: ?  No formal exercise.  Start working at [employer] AM for [employer].  3.  Medication compliance: compliant all of the time.  # HYPOGONADISM:  He has been on testosterone supplements since 2011 when his level was mildly low around 300  but no further evaluation done. He has a relatively low testosterone level while using Axiron and had difficulty with the application. With Androderm patches he had improved testosterone levels and less fatigue. His  testosterone level had been inconsistent with using 8 mg Androderm and was taking the 6 mg total dose. He was getting more skin irritation and sensitivity to the Androderm, also had nasal irritation/bad taste with Natesto.  In early 2024 switched to taking Jatenzo 237 mg twice a day with food, with the higher dose he has consistently good energy level.  Interval history  Diabetes regimen as reviewed and noted above.  Hemoglobin A1c slightly improved to 7.4% today.  Fructosamine 299 elevated.  He testosterone level with mildly low however had mid normal range on the current dose in the past.  He had missed at least 2 doses of Jatenzo prior to the lab test when going to endoscopy.  He is overall feeling good and normal energy, normal libido.  Recent lab results reviewed.  He reports he has been eating iron infusion for iron deficiency anemia.  No other complaints today.  REVIEW OF SYSTEMS As per history of present illness.   PAST MEDICAL HISTORY: Past Medical History:  Diagnosis Date   Anemia    COVID-19 11/2020   Diabetes mellitus    Episcleritis of right eye 06/2016   GERD (gastroesophageal reflux disease)    Gout    Hyperlipidemia    Hypertension    RA (rheumatoid arthritis) (HCC)    Sleep apnea    CPAP use     PAST SURGICAL HISTORY:  Past Surgical History:  Procedure Laterality Date   COLONOSCOPY  2015   INSERTION OF MESH N/A 10/20/2021   Procedure: INSERTION OF MESH;  Surgeon: Diamantina Monks, MD;  Location: [facility] OR;  Service: General;  Laterality: N/A;   KNEE SURGERY Left 2005   VENTRAL HERNIA REPAIR N/A 10/20/2021   Procedure: LAPAROSCOPIC VENTRAL HERNIA REPAIR;  Surgeon: Diamantina Monks, MD;  Location: [facility] OR;  Service: General;  Laterality: N/A;    ALLERGIES: Allergies  Allergen Reactions   Celebrex [Celecoxib] Itching   Penicillins Itching   Sulfonamide Derivatives Itching    FAMILY HISTORY:  Family History  Problem Relation Age of Onset   Diabetes Mother     Diabetes Maternal Aunt    Diabetes Maternal Grandmother    Stomach cancer Other    Heart disease Neg Hx    Colon cancer Neg Hx    Esophageal cancer Neg Hx    Rectal cancer Neg Hx    Colon polyps Neg Hx     SOCIAL HISTORY: Social History   Socioeconomic History   Marital status: Married    Spouse name: Not on file   Number of children: Not on file   Years of education: Not on file   Highest education level: Bachelor's degree (e.g., BA, AB, BS)  Occupational History   Not on file  Tobacco Use   Smoking status: Never   Smokeless tobacco: Never  Vaping Use   Vaping status: Never Used  Substance and Sexual Activity   Alcohol use: Yes    Comment: rare beer- 3 beers per year   Drug use: Not Currently   Sexual activity: Yes  Other Topics Concern   Not on file  Social History Narrative   Not on file   Social Drivers of Health   Financial Resource Strain: Low Risk  (05/23/2023)   Overall Financial Resource Strain (CARDIA)    Difficulty of Paying Living Expenses: Not hard at all  Food Insecurity: No Food Insecurity (05/23/2023)   Hunger Vital Sign    Worried About Running Out of Food in the Last Year: Never true    Ran Out of Food in the Last Year: Never true  Transportation Needs: No Transportation Needs (05/23/2023)   PRAPARE - Administrator, Civil Service (Medical): No    Lack of Transportation (Non-Medical): No  Physical Activity: Sufficiently Active (05/23/2023)   Exercise Vital Sign    Days of Exercise per Week: 5 days    Minutes of Exercise per Session: 50 min  Stress: No Stress Concern Present (05/23/2023)   [employer] of Occupational Health - Occupational Stress Questionnaire    Feeling of Stress : Not at all  Social Connections: Socially Integrated (05/23/2023)   Social Connection and Isolation Panel [NHANES]    Frequency of Communication with Friends and Family: Twice a week    Frequency of Social Gatherings with Friends and Family: Once a  week    Attends Religious Services: More than 4 times per year    Active Member of [employer] or Organizations: Yes    Attends Engineer, structural: More than 4 times per year    Marital Status: Married    MEDICATIONS:  Current Outpatient Medications  Medication Sig Dispense Refill   allopurinol (ZYLOPRIM) 300 MG tablet TAKE 1 TABLET BY MOUTH EVERY DAY 90 tablet 1   amLODipine (NORVASC) 2.5 MG tablet Take 1 tablet (2.5 mg total) by mouth daily. 90 tablet 3   BD PEN  NEEDLE NANO U/F 32G X 4 MM MISC USE EVERY DAY 100 each 1   Blood Glucose Monitoring Suppl (ACCU-CHEK GUIDE) w/Device KIT 1 each by Does not apply route 3 (three) times daily. Use accu chek guide device to check blood sugar twice daily. DX:E11.9 1 kit 0   Empagliflozin-metFORMIN HCl ER (SYNJARDY XR) 12.11-998 MG TB24 Take 2 tablets by mouth daily with breakfast. 180 tablet 2   ENBREL SURECLICK 50 MG/ML injection Inject 50 mg into the skin every Monday.     ferrous gluconate (FERGON) 324 MG tablet Take 1 tablet (324 mg total) by mouth daily. 90 tablet 1   fluticasone (FLONASE) 50 MCG/ACT nasal spray PLACE 2 SPRAYS INTO THE NOSE DAILY. (Patient taking differently: Place 2 sprays into both nostrils daily as needed for allergies.) 48 mL 1   glucose blood test strip Use as instructed 100 each 12   hydroxychloroquine (PLAQUENIL) 200 MG tablet Take 200 mg by mouth 2 (two) times daily.     loratadine (CLARITIN) 10 MG tablet Take 1 tablet (10 mg total) by mouth daily. 90 tablet 0   losartan-hydrochlorothiazide (HYZAAR) 50-12.5 MG tablet Take 1 tablet by mouth daily. 90 tablet 0   omeprazole (PRILOSEC) 40 MG capsule TAKE 1 CAPSULE (40 MG TOTAL) BY MOUTH DAILY. 90 capsule 1   OneTouch Delica Lancets 33G MISC Check blood sugar 2 times daily 100 each 12   OZEMPIC, 2 MG/DOSE, 8 MG/3ML SOPN INJECT 2 MG WEEKLY FOR DIABETES 9 mL 3   simvastatin (ZOCOR) 20 MG tablet Take 1 tablet (20 mg total) by mouth at bedtime. 90 tablet 3   vardenafil  (LEVITRA) 20 MG tablet Take by mouth.     VIAGRA 100 MG tablet Take 100 mg by mouth daily as needed for erectile dysfunction.     glimepiride (AMARYL) 2 MG tablet TAKE 1 TABLET BY MOUTH EVERY DAY WITH BREAKFAST 90 tablet 3   Testosterone Undecanoate (JATENZO) 237 MG CAPS 1 capsule twice daily with food 180 capsule 1   No current facility-administered medications for this visit.    PHYSICAL EXAM: Vitals:   08/20/23 1515  BP: 122/88  Pulse: 92  SpO2: 98%  Weight: 259 lb (117.5 kg)  Height: 5\' 10"  (1.778 m)   Body mass index is 37.16 kg/m.  Wt Readings from Last 3 Encounters:  08/20/23 259 lb (117.5 kg)  08/13/23 260 lb (117.9 kg)  08/12/23 262 lb 12.8 oz (119.2 kg)    General: Well developed, well nourished male in no apparent distress.  HEENT: AT/[place], no external lesions.  Eyes: Conjunctiva clear and no icterus. Neck: Neck supple  Lungs: Respirations not labored Neurologic: Alert, oriented, normal speech Extremities / Skin: Dry. No sores or rashes noted.  Psychiatric: Does not appear depressed or anxious  Diabetic Foot Exam - Simple   Simple Foot Form Diabetic Foot exam was performed with the following findings: Yes 08/20/2023  3:51 PM  Visual Inspection See comments: Yes Sensation Testing Intact to touch and monofilament testing bilaterally: Yes Pulse Check Posterior Tibialis and Dorsalis pulse intact bilaterally: Yes Comments B/ bunion present. No ulcer. No callus.      LABS Reviewed Lab Results  Component Value Date   HGBA1C 7.4 (A) 08/20/2023   HGBA1C 7.7 (H) 05/13/2023   HGBA1C 7.8 (H) 01/25/2023   Lab Results  Component Value Date   FRUCTOSAMINE 299 (H) 08/13/2023   FRUCTOSAMINE 254 01/09/2016   FRUCTOSAMINE 264 08/24/2013   Lab Results  Component Value Date  CHOL 141 06/24/2023   HDL 32.00 (L) 06/24/2023   LDLCALC 96 06/24/2023   TRIG 68.0 06/24/2023   CHOLHDL 4 06/24/2023   Lab Results  Component Value Date   MICRALBCREAT 0.8 01/25/2023    MICRALBCREAT 0.5 10/30/2021   Lab Results  Component Value Date   CREATININE 1.18 08/13/2023   Lab Results  Component Value Date   GFR 57.56 (L) 05/13/2023    ASSESSMENT / PLAN  1. Uncontrolled type 2 diabetes mellitus with hyperglycemia, without long-term current use of insulin (HCC)   2. Male hypogonadism     Diabetes Mellitus type 2, complicated by diabetic neuropathy/CKD. - Diabetic status / severity: Uncontrolled.  Lab Results  Component Value Date   HGBA1C 7.4 (A) 08/20/2023    - Hemoglobin A1c goal : <7%  Fructosamine 299 elevated.  He has remained uncontrolled type 2 diabetes mellitus however slowly improving.  - Medications: See below..  Ozempic 2 mg weekly. Synjardy 12.5-1000mg  2 tab daily.  Amaryl 1 mg daily with breakfast, increase to 2 mg.  - Home glucose testing: In the morning fasting and bedtime at least. - Discussed/ Gave Hypoglycemia treatment plan.  # Consult : not required at this time.   # Annual urine for microalbuminuria/ creatinine ratio, no microalbuminuria currently, continue ACE/ARB /losartan. Last  Lab Results  Component Value Date   MICRALBCREAT 0.8 01/25/2023    # Foot check nightly / neuropathy.  # Annual dilated diabetic eye exams.   - Diet: Make healthy diabetic food choices - Life style / activity / exercise: Discussed.  2. Blood pressure  -  BP Readings from Last 1 Encounters:  08/20/23 122/88    - Control is in target.  - No change in current plans.  3. Lipid status / Hyperlipidemia - Last  Lab Results  Component Value Date   LDLCALC 96 06/24/2023   - Continue simvastatin 20 mg daily.  # Hypogonadism: -He has been on testosterone supplement since 2011. -Continue Jatenzo 237 mg 2 times per day with food.  Will not change dose at this time.  He had mid normal level of testosterone on the current dose in the past.  He had missed 2 doses of Jatenzo prior to the test. -Will check total, free and bioavailable  testosterone prior to follow-up visit.   Torance was seen today for follow-up.  Diagnoses and all orders for this visit:  Uncontrolled type 2 diabetes mellitus with hyperglycemia, without long-term current use of insulin (HCC) -     POCT HgB A1C -     Hemoglobin A1c -     BASIC METABOLIC PANEL WITH GFR  Male hypogonadism -     Testosterone Undecanoate (JATENZO) 237 MG CAPS; 1 capsule twice daily with food -     Testosterone Total,Free,Bio, Males  Other orders -     glimepiride (AMARYL) 2 MG tablet; TAKE 1 TABLET BY MOUTH EVERY DAY WITH BREAKFAST    DISPOSITION Follow up in clinic in 3  months suggested.   All questions answered and patient verbalized understanding of the plan.  [place] Dyane Broberg, MD [facility] Endocrinology [facility] [address] [place], [address] [place], [place] [address] Phone # [phone]  At least part of this note was generated using voice recognition software. Inadvertent word errors may have occurred, which were not recognized during the proofreading process.

## 2023-08-20 NOTE — Patient Instructions (Addendum)
Diabetes regimen , increase glimepiride to 2 mg daily, rest same.   Check sugar in the morning fasting and at bedtime.

## 2023-08-21 ENCOUNTER — Encounter: Payer: Self-pay | Admitting: Endocrinology

## 2023-08-21 ENCOUNTER — Ambulatory Visit: Payer: BC Managed Care – PPO | Admitting: Family Medicine

## 2023-08-21 DIAGNOSIS — I10 Essential (primary) hypertension: Secondary | ICD-10-CM

## 2023-08-21 DIAGNOSIS — E782 Mixed hyperlipidemia: Secondary | ICD-10-CM

## 2023-08-21 DIAGNOSIS — D509 Iron deficiency anemia, unspecified: Secondary | ICD-10-CM

## 2023-08-21 DIAGNOSIS — E1169 Type 2 diabetes mellitus with other specified complication: Secondary | ICD-10-CM

## 2023-08-23 ENCOUNTER — Other Ambulatory Visit: Payer: Self-pay

## 2023-08-23 MED ORDER — BISMUTH/METRONIDAZ/TETRACYCLIN 140-125-125 MG PO CAPS: 3.0000 | ORAL_CAPSULE | Freq: Three times a day (TID) | ORAL | 0 refills | Status: AC

## 2023-08-23 MED ORDER — OMEPRAZOLE 20 MG PO CPDR: 20.0000 mg | DELAYED_RELEASE_CAPSULE | Freq: Two times a day (BID) | ORAL | 0 refills | Status: AC

## 2023-08-26 ENCOUNTER — Encounter: Payer: Self-pay | Admitting: Family Medicine

## 2023-08-28 ENCOUNTER — Encounter: Payer: Self-pay | Admitting: Family Medicine

## 2023-08-28 ENCOUNTER — Ambulatory Visit: Payer: BC Managed Care – PPO | Admitting: Family Medicine

## 2023-08-28 VITALS — BP 124/76 | HR 78 | Temp 98.3°F | Ht 70.0 in | Wt 263.0 lb

## 2023-08-28 DIAGNOSIS — E291 Testicular hypofunction: Secondary | ICD-10-CM

## 2023-08-28 DIAGNOSIS — I10 Essential (primary) hypertension: Secondary | ICD-10-CM | POA: Diagnosis not present

## 2023-08-28 DIAGNOSIS — A048 Other specified bacterial intestinal infections: Secondary | ICD-10-CM

## 2023-08-28 DIAGNOSIS — E1169 Type 2 diabetes mellitus with other specified complication: Secondary | ICD-10-CM

## 2023-08-28 DIAGNOSIS — K2100 Gastro-esophageal reflux disease with esophagitis, without bleeding: Secondary | ICD-10-CM | POA: Diagnosis not present

## 2023-08-28 DIAGNOSIS — D509 Iron deficiency anemia, unspecified: Secondary | ICD-10-CM | POA: Diagnosis not present

## 2023-08-28 DIAGNOSIS — E782 Mixed hyperlipidemia: Secondary | ICD-10-CM

## 2023-08-28 DIAGNOSIS — Z7984 Long term (current) use of oral hypoglycemic drugs: Secondary | ICD-10-CM

## 2023-08-28 NOTE — Progress Notes (Signed)
Established Patient Office Visit   Subjective  Patient ID: Clifford Kramer, male    DOB: [birth year]  Age: 58 y.o. MRN: [id number]  Chief Complaint  Patient presents with   Medical Management of Chronic Issues    Pt is a 58 yo male seen for f/u.  Pt feeling like he has more energy since receiving iron infusions.  Pt initially had constipation due to taking iron tablets with iron infusion.  MiraLAX was helpful then stopped working.  Patient notes improvement after stopping p.o. iron supplement.  Patient had EGD with Dr. Rhea Belton, GI.  H. pylori found.  Patient advised to take omeprazole 20 mg twice daily along with bismuth, metronidazole, tetracycline.  Patient has not started medications as second prescription just became available at pharmacy.  Patient was also confused regarding omeprazole as he already takes 40 mg daily.  Unsure if has upcoming follow-up with GI.  Patient notes some heartburn symptoms with taking testosterone tab in the evening.  Had recent follow-up with Endo for DM.  A1c was 7.4% on 08/20/2023.    Patient Active Problem List   Diagnosis Date Noted   Iron deficiency 08/08/2023   Iron deficiency anemia 05/23/2023   History of colonic polyps 02/25/2023   Obesity 11/21/2020   Posttraumatic stress disorder 11/21/2020   Snoring 11/21/2020   Routine general medical examination at a health care facility 10/01/2017   Male hypogonadism 01/16/2016   Type II diabetes mellitus, uncontrolled 03/04/2013   Dyslipidemia (high LDL; low HDL) 03/04/2013   Alpha trait thalassemia 07/30/2012   Hyperlipidemia 07/30/2012   Gout 08/17/2010   Obstructive sleep apnea 12/07/2009   Hypersomnia with sleep 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   ERECTILE DYSFUNCTION 03/07/2007   Essential hypertension 03/07/2007   ALLERGIC RHINITIS 03/07/2007   Type 2 or unspecified type diabetes mellitus, uncontrolled 07/30/2006   Past Medical History:  Diagnosis Date   Anemia    COVID-19 11/2020   Diabetes mellitus     Episcleritis of right eye 06/2016   GERD (gastroesophageal reflux disease)    Gout    Hyperlipidemia    Hypertension    RA (rheumatoid arthritis) (HCC)    Sleep apnea    CPAP use    Past Surgical History:  Procedure Laterality Date   COLONOSCOPY  2015   INSERTION OF MESH N/A 10/20/2021   Procedure: INSERTION OF MESH;  Surgeon: Diamantina Monks, MD;  Location: [facility] OR;  Service: General;  Laterality: N/A;   KNEE SURGERY Left 2005   VENTRAL HERNIA REPAIR N/A 10/20/2021   Procedure: LAPAROSCOPIC VENTRAL HERNIA REPAIR;  Surgeon: Diamantina Monks, MD;  Location: [facility] OR;  Service: General;  Laterality: N/A;   Social History   Tobacco Use   Smoking status: Never   Smokeless tobacco: Never  Vaping Use   Vaping status: Never Used  Substance Use Topics   Alcohol use: Yes    Comment: rare beer- 3 beers per year   Drug use: Not Currently   Family History  Problem Relation Age of Onset   Diabetes Mother    Diabetes Maternal Aunt    Diabetes Maternal Grandmother    Stomach cancer Other    Heart disease Neg Hx    Colon cancer Neg Hx    Esophageal cancer Neg Hx    Rectal cancer Neg Hx    Colon polyps Neg Hx    Allergies  Allergen Reactions   Celebrex [Celecoxib] Itching   Penicillins Itching   Sulfonamide Derivatives Itching  ROS Negative unless stated above    Objective:     BP 124/76 (BP Location: Left Arm, Patient Position: Sitting, Cuff Size: Large)   Pulse 78   Temp 98.3 F (36.8 C) (Oral)   Ht 5\' 10"  (1.778 m)   Wt 263 lb (119.3 kg)   SpO2 95%   BMI 37.74 kg/m  BP Readings from Last 3 Encounters:  08/28/23 124/76  08/20/23 122/88  08/13/23 124/81   Wt Readings from Last 3 Encounters:  08/28/23 263 lb (119.3 kg)  08/20/23 259 lb (117.5 kg)  08/13/23 260 lb (117.9 kg)      Physical Exam Constitutional:      General: He is not in acute distress.    Appearance: Normal appearance.  HENT:     Head: Normocephalic and atraumatic.     Nose: Nose normal.      Mouth/Throat:     Mouth: Mucous membranes are moist.  Cardiovascular:     Rate and Rhythm: Normal rate and regular rhythm.     Heart sounds: Normal heart sounds. No murmur heard.    No gallop.  Pulmonary:     Effort: Pulmonary effort is normal. No respiratory distress.     Breath sounds: Normal breath sounds. No wheezing, rhonchi or rales.  Skin:    General: Skin is warm and dry.  Neurological:     Mental Status: He is alert and oriented to person, place, and time.       08/12/2023    1:30 PM 08/08/2023    1:04 PM 08/02/2023    2:29 PM  Depression screen PHQ 2/9  Decreased Interest 0 0 0  Down, Depressed, Hopeless 0 0 0  PHQ - 2 Score 0 0 0      05/22/2021    1:40 PM  GAD 7 : Generalized Anxiety Score  Nervous, Anxious, on Edge 0  Control/stop worrying 0  Worry too much - different things 0  Trouble relaxing 0  Restless 0  Easily annoyed or irritable 0  Afraid - awful might happen 0  Total GAD 7 Score 0  Anxiety Difficulty Not difficult at all      No results found for any visits on 08/28/23.    Assessment & Plan:  H. pylori infection  Gastroesophageal reflux disease with esophagitis without hemorrhage  Essential hypertension  Iron deficiency anemia, unspecified iron deficiency anemia type  Type 2 diabetes mellitus with other specified complication, without long-term current use of insulin [facility])  Male hypogonadism  Patient advised to start taking prescription for omeprazole 20 mg twice daily, bismuth, metronidazole, tetracycline to treat H. pylori infection noted on recent EGD.  H. pylori infection/GERD likely contributed to patient's anemia.  Complete course of iron infusions.  Last hemoglobin 15 on 08/13/2023.  Advised to contact GI regarding scheduling follow-up.  BP well-controlled.  Continue current medication including losartan-hydrochlorothiazide 50-12.5 mg daily, Norvasc 2.5 mg daily.  DM controlled.  Continue current medications and follow-up with  endocrinology.  Foot exam up-to-date done 08/20/2023.  Eye exam due in November 2025.  Continue testosterone 237 mg twice daily.  Return in about 3 months (around 11/26/2023).   Deeann Saint, MD

## 2023-09-24 ENCOUNTER — Encounter: Payer: Self-pay | Admitting: Endocrinology

## 2023-09-25 ENCOUNTER — Other Ambulatory Visit: Payer: Self-pay

## 2023-09-25 DIAGNOSIS — I10 Essential (primary) hypertension: Secondary | ICD-10-CM

## 2023-09-25 MED ORDER — AMLODIPINE BESYLATE 2.5 MG PO TABS: 2.5000 mg | ORAL_TABLET | Freq: Every day | ORAL | 3 refills | Status: DC

## 2023-10-24 ENCOUNTER — Other Ambulatory Visit: Payer: Self-pay

## 2023-10-24 DIAGNOSIS — A048 Other specified bacterial intestinal infections: Secondary | ICD-10-CM

## 2023-11-02 ENCOUNTER — Other Ambulatory Visit: Payer: Self-pay | Admitting: Endocrinology

## 2023-11-02 DIAGNOSIS — I10 Essential (primary) hypertension: Secondary | ICD-10-CM

## 2023-11-04 ENCOUNTER — Other Ambulatory Visit: Payer: Self-pay

## 2023-11-04 DIAGNOSIS — E291 Testicular hypofunction: Secondary | ICD-10-CM

## 2023-11-04 DIAGNOSIS — E1165 Type 2 diabetes mellitus with hyperglycemia: Secondary | ICD-10-CM

## 2023-11-11 ENCOUNTER — Other Ambulatory Visit

## 2023-11-11 DIAGNOSIS — A048 Other specified bacterial intestinal infections: Secondary | ICD-10-CM

## 2023-11-13 LAB — H. PYLORI ANTIGEN, STOOL: H pylori Ag, Stl: NEGATIVE

## 2023-11-14 ENCOUNTER — Other Ambulatory Visit: Payer: BC Managed Care – PPO

## 2023-11-15 LAB — BASIC METABOLIC PANEL WITH GFR
BUN/Creatinine Ratio: 9 (calc) (ref 6–22)
BUN: 13 mg/dL (ref 7–25)
CO2: 24 mmol/L (ref 20–32)
Calcium: 8.7 mg/dL (ref 8.6–10.3)
Chloride: 103 mmol/L (ref 98–110)
Creat: 1.38 mg/dL — ABNORMAL HIGH (ref 0.70–1.30)
Glucose, Bld: 97 mg/dL (ref 65–99)
Potassium: 3.7 mmol/L (ref 3.5–5.3)
Sodium: 136 mmol/L (ref 135–146)
eGFR: 59 mL/min/{1.73_m2} — ABNORMAL LOW (ref >=60)

## 2023-11-15 LAB — TESTOSTERONE TOTAL,FREE,BIO, MALES
Albumin: 4.3 g/dL (ref 3.6–5.1)
Sex Hormone Binding: 11 nmol/L — ABNORMAL LOW (ref 22–77)
Testosterone, Bioavailable: 199.2 ng/dL (ref 110.0–575.0)
Testosterone, Free: 101.2 pg/mL (ref 46.0–224.0)
Testosterone: 364 ng/dL (ref 250–827)

## 2023-11-15 LAB — HEMOGLOBIN A1C
Hgb A1c MFr Bld: 7.6 % — ABNORMAL HIGH (ref <5.7)
Mean Plasma Glucose: 171 mg/dL
eAG (mmol/L): 9.5 mmol/L

## 2023-11-18 ENCOUNTER — Encounter: Payer: Self-pay | Admitting: Endocrinology

## 2023-11-18 ENCOUNTER — Encounter: Payer: Self-pay | Admitting: Internal Medicine

## 2023-11-19 ENCOUNTER — Ambulatory Visit (INDEPENDENT_AMBULATORY_CARE_PROVIDER_SITE_OTHER): Payer: BC Managed Care – PPO | Admitting: Endocrinology

## 2023-11-19 ENCOUNTER — Encounter: Payer: Self-pay | Admitting: Endocrinology

## 2023-11-19 VITALS — BP 118/80 | HR 71 | Resp 20 | Ht 70.0 in | Wt 263.2 lb

## 2023-11-19 DIAGNOSIS — E291 Testicular hypofunction: Secondary | ICD-10-CM | POA: Diagnosis not present

## 2023-11-19 DIAGNOSIS — E1165 Type 2 diabetes mellitus with hyperglycemia: Secondary | ICD-10-CM | POA: Diagnosis not present

## 2023-11-19 DIAGNOSIS — Z794 Long term (current) use of insulin: Secondary | ICD-10-CM | POA: Diagnosis not present

## 2023-11-19 MED ORDER — ACCU-CHEK GUIDE TEST VI STRP: ORAL_STRIP | 12 refills | Status: AC

## 2023-11-19 NOTE — Progress Notes (Signed)
 Outpatient Endocrinology Note [place] Ben[place], MD  11/19/23  Patient's Name: Clifford Kramer    DOB: 1965/12/14    MRN: [id number]                                                    REASON OF VISIT: Follow-up of type 2 diabetes mellitus  REFERRING PROVIDER:   PCP: Viola Greulich, MD  HISTORY OF PRESENT ILLNESS:   Clifford Kramer is a 58 y.o. old male with past medical history listed below, is here for follow up of type 2 diabetes mellitus.   Pertinent Diabetes History: Patient was diagnosed with type 2 diabetes mellitus in 2005.  Chronic Diabetes Complications : Retinopathy: no. Last ophthalmology exam was done on annually, at [place]. Nephropathy: CKD IIIa, on losartan  Peripheral neuropathy: yes, follow with podiatry.  Coronary artery disease: no Stroke: no  Relevant comorbidities and cardiovascular risk factors: Obesity: yes Body mass index is 37.77 kg/m.  Hypertension: yes Hyperlipidemia. Yes, on statin.   Current / Home Diabetic regimen includes: Ozempic  2 mg weekly. Synjardy  12.5-1000mg  2 tab daily.  Amaryl   2 mg daily with breakfast.   Prior diabetic medications: Glipizide  stopped due to ? hypoglycemia.  Glycemic data: Accu-Chek glucometer: This morning blood sugar 150.  Hypoglycemia: Patient has no hypoglycemic episodes. Patient has hypoglycemia awareness.  Factors modifying glucose control: 1.  Diabetic diet assessment: 3 meals a day.  2.  Staying active or exercising: ?  No formal exercise.  Start working at [employer] AM for [employer].  3.  Medication compliance: compliant all of the time.  # HYPOGONADISM:  He has been on testosterone  supplements since 2011 when his level was mildly low around 300  but no further evaluation done. He has a relatively low testosterone  level while using Axiron  and had difficulty with the application. With Androderm  patches he had improved testosterone  levels and less fatigue. His testosterone  level had been inconsistent with using 8 mg  Androderm  and was taking the 6 mg total dose. He was getting more skin irritation and sensitivity to the Androderm , also had nasal irritation/bad taste with Natesto .  In early 2024 switched to taking Jatenzo  237 mg twice a day with food, with the higher dose he has consistently good energy level.  Interval history  Recent laboratory results reviewed.  Hemoglobin A1c 7.6%.  Testosterone  levels acceptable.  Electrolytes and renal function is stable.  He reports he was not taking Synjardy  for about 2 weeks in between the refills.  Diabetes regimen is reviewed and noted above.  He has been tolerating Ozempic  well.  He is overall feeling good energy and normal libido.  No other complaints today.  REVIEW OF SYSTEMS As per history of present illness.   PAST MEDICAL HISTORY: Past Medical History:  Diagnosis Date   Anemia    COVID-19 11/2020   Diabetes mellitus    Episcleritis of right eye 06/2016   GERD (gastroesophageal reflux disease)    Gout    Hyperlipidemia    Hypertension    RA (rheumatoid arthritis) (HCC)    Sleep apnea    CPAP use     PAST SURGICAL HISTORY: Past Surgical History:  Procedure Laterality Date   COLONOSCOPY  2015   INSERTION OF MESH N/A 10/20/2021   Procedure: INSERTION OF MESH;  Surgeon: Anda Bamberg, MD;  Location:  [facility] OR;  Service: General;  Laterality: N/A;   KNEE SURGERY Left 2005   VENTRAL HERNIA REPAIR N/A 10/20/2021   Procedure: LAPAROSCOPIC VENTRAL HERNIA REPAIR;  Surgeon: Anda Bamberg, MD;  Location: [facility] OR;  Service: General;  Laterality: N/A;    ALLERGIES: Allergies  Allergen Reactions   Celebrex [Celecoxib] Itching   Penicillins Itching   Sulfonamide Derivatives Itching    FAMILY HISTORY:  Family History  Problem Relation Age of Onset   Diabetes Mother    Diabetes Maternal Aunt    Diabetes Maternal Grandmother    Stomach cancer Other    Heart disease Neg Hx    Colon cancer Neg Hx    Esophageal cancer Neg Hx    Rectal cancer Neg Hx     Colon polyps Neg Hx     SOCIAL HISTORY: Social History   Socioeconomic History   Marital status: Married    Spouse name: Not on file   Number of children: Not on file   Years of education: Not on file   Highest education level: Bachelor's degree (e.g., BA, AB, BS)  Occupational History   Not on file  Tobacco Use   Smoking status: Never   Smokeless tobacco: Never  Vaping Use   Vaping status: Never Used  Substance and Sexual Activity   Alcohol use: Yes    Comment: rare beer- 3 beers per year   Drug use: Not Currently   Sexual activity: Yes  Other Topics Concern   Not on file  Social History Narrative   Not on file   Social Drivers of Health   Financial Resource Strain: Low Risk  (08/27/2023)   Overall Financial Resource Strain (CARDIA)    Difficulty of Paying Living Expenses: Not hard at all  Food Insecurity: No Food Insecurity (08/27/2023)   Hunger Vital Sign    Worried About Running Out of Food in the Last Year: Never true    Ran Out of Food in the Last Year: Never true  Transportation Needs: No Transportation Needs (08/27/2023)   PRAPARE - Administrator, Civil Service (Medical): No    Lack of Transportation (Non-Medical): No  Physical Activity: Sufficiently Active (08/27/2023)   Exercise Vital Sign    Days of Exercise per Week: 5 days    Minutes of Exercise per Session: 80 min  Stress: No Stress Concern Present (08/27/2023)   [employer] of Occupational Health - Occupational Stress Questionnaire    Feeling of Stress : Only a little  Social Connections: Moderately Integrated (08/27/2023)   Social Connection and Isolation Panel [NHANES]    Frequency of Communication with Friends and Family: Once a week    Frequency of Social Gatherings with Friends and Family: Once a week    Attends Religious Services: More than 4 times per year    Active Member of [employer] or Organizations: Yes    Attends Engineer, structural: More than 4 times per year     Marital Status: Married    MEDICATIONS:  Current Outpatient Medications  Medication Sig Dispense Refill   allopurinol  (ZYLOPRIM ) 300 MG tablet TAKE 1 TABLET BY MOUTH EVERY DAY 90 tablet 1   amLODipine  (NORVASC ) 2.5 MG tablet Take 1 tablet (2.5 mg total) by mouth daily. 90 tablet 3   BD PEN NEEDLE NANO U/F 32G X 4 MM MISC USE EVERY DAY 100 each 1   Bismuth /Metronidaz/Tetracyclin (PYLERA) 140-125-125 MG CAPS Take 3 capsules by mouth 4 (four) times daily - after meals  and at bedtime. 120 capsule 0   Blood Glucose Monitoring Suppl (ACCU-CHEK GUIDE) w/Device KIT 1 each by Does not apply route 3 (three) times daily. Use accu chek guide device to check blood sugar twice daily. DX:E11.9 1 kit 0   Empagliflozin -metFORMIN  HCl ER (SYNJARDY  XR) 12.11-998 MG TB24 Take 2 tablets by mouth daily with breakfast. 180 tablet 2   ENBREL SURECLICK 50 MG/ML injection Inject 50 mg into the skin every Monday.     fluticasone  (FLONASE ) 50 MCG/ACT nasal spray PLACE 2 SPRAYS INTO THE NOSE DAILY. (Patient taking differently: Place 2 sprays into both nostrils daily as needed for allergies.) 48 mL 1   glimepiride  (AMARYL ) 2 MG tablet TAKE 1 TABLET BY MOUTH EVERY DAY WITH BREAKFAST 90 tablet 3   glucose blood (ACCU-CHEK GUIDE TEST) test strip Use to test blood glucose before meals and at bedtime 300 each 12   glucose blood test strip Use as instructed 100 each 12   hydroxychloroquine (PLAQUENIL) 200 MG tablet Take 200 mg by mouth 2 (two) times daily.     loratadine  (CLARITIN ) 10 MG tablet Take 1 tablet (10 mg total) by mouth daily. 90 tablet 0   losartan -hydrochlorothiazide  (HYZAAR) 50-12.5 MG tablet TAKE 1 TABLET BY MOUTH EVERY DAY 90 tablet 3   omeprazole  (PRILOSEC) 20 MG capsule Take 1 capsule (20 mg total) by mouth 2 (two) times daily before a meal. 20 capsule 0   omeprazole  (PRILOSEC) 40 MG capsule TAKE 1 CAPSULE (40 MG TOTAL) BY MOUTH DAILY. 90 capsule 1   OneTouch Delica Lancets 33G MISC Check blood sugar 2 times  daily 100 each 12   OZEMPIC , 2 MG/DOSE, 8 MG/3ML SOPN INJECT 2 MG WEEKLY FOR DIABETES 9 mL 3   simvastatin  (ZOCOR ) 20 MG tablet Take 1 tablet (20 mg total) by mouth at bedtime. 90 tablet 3   Testosterone  Undecanoate (JATENZO ) 237 MG CAPS 1 capsule twice daily with food 180 capsule 1   vardenafil  (LEVITRA ) 20 MG tablet Take by mouth.     VIAGRA 100 MG tablet Take 100 mg by mouth daily as needed for erectile dysfunction.     No current facility-administered medications for this visit.    PHYSICAL EXAM: Vitals:   11/19/23 0814  BP: 118/80  Pulse: 71  Resp: 20  SpO2: 95%  Weight: 263 lb 3.2 oz (119.4 kg)  Height: 5\' 10"  (1.778 m)    Body mass index is 37.77 kg/m.  Wt Readings from Last 3 Encounters:  11/19/23 263 lb 3.2 oz (119.4 kg)  08/28/23 263 lb (119.3 kg)  08/20/23 259 lb (117.5 kg)    General: Well developed, well nourished male in no apparent distress.  HEENT: AT/[place], no external lesions.  Eyes: Conjunctiva clear and no icterus. Neck: Neck supple  Lungs: Respirations not labored Neurologic: Alert, oriented, normal speech Extremities / Skin: Dry. Psychiatric: Does not appear depressed or anxious  Diabetic Foot Exam - Simple   No data filed     LABS Reviewed Lab Results  Component Value Date   HGBA1C 7.6 (H) 11/14/2023   HGBA1C 7.4 (A) 08/20/2023   HGBA1C 7.7 (H) 05/13/2023   Lab Results  Component Value Date   FRUCTOSAMINE 299 (H) 08/13/2023   FRUCTOSAMINE 254 01/09/2016   FRUCTOSAMINE 264 08/24/2013   Lab Results  Component Value Date   CHOL 141 06/24/2023   HDL 32.00 (L) 06/24/2023   LDLCALC 96 06/24/2023   TRIG 68.0 06/24/2023   CHOLHDL 4 06/24/2023   Lab Results  Component Value Date   MICRALBCREAT 0.8 01/25/2023   MICRALBCREAT 0.5 10/30/2021   Lab Results  Component Value Date   CREATININE 1.38 (H) 11/14/2023   Lab Results  Component Value Date   GFR 57.56 (L) 05/13/2023    ASSESSMENT / PLAN  1. Uncontrolled type 2 diabetes  mellitus with hyperglycemia, with long-term current use of insulin  (HCC)   2. Male hypogonadism      Diabetes Mellitus type 2, complicated by diabetic neuropathy/CKD. - Diabetic status / severity: Uncontrolled.  Lab Results  Component Value Date   HGBA1C 7.6 (H) 11/14/2023    - Hemoglobin A1c goal : <6.5%  Discussed about portion control and limiting carbohydrates.  Discussed about increasing schedule exercise regularly.  No change on the medications at this time if he remained uncontrolled we will adjust diabetes regimen in next follow-up visit.  - Medications: See below..  Ozempic  2 mg weekly. Synjardy  12.5-1000mg  2 tab daily.  Amaryl  2 mg daily with breakfast.  - Home glucose testing: In the morning fasting and bedtime at least.  Advised to check blood sugar more often. - Discussed/ Gave Hypoglycemia treatment plan.  # Consult : not required at this time.   # Annual urine for microalbuminuria/ creatinine ratio, no microalbuminuria currently, continue ACE/ARB /losartan  /SGLT2 inhibitor. Last  Lab Results  Component Value Date   MICRALBCREAT 0.8 01/25/2023    # Foot check nightly / neuropathy.  # Annual dilated diabetic eye exams.   - Diet: Make healthy diabetic food choices - Life style / activity / exercise: Discussed.  2. Blood pressure  -  BP Readings from Last 1 Encounters:  11/19/23 118/80    - Control is in target.  - No change in current plans.  3. Lipid status / Hyperlipidemia - Last  Lab Results  Component Value Date   LDLCALC 96 06/24/2023   - Continue simvastatin  20 mg daily.  # Hypogonadism: -He has been on testosterone  supplement since 2011.  Recent testosterone  level acceptable. -Continue Jatenzo  237 mg 2 times per day with food.   Diagnoses and all orders for this visit:  Uncontrolled type 2 diabetes mellitus with hyperglycemia, with long-term current use of insulin  (HCC) -     glucose blood (ACCU-CHEK GUIDE TEST) test strip; Use to  test blood glucose before meals and at bedtime  Male hypogonadism    DISPOSITION Follow up in clinic in 3  months suggested.  Labs on the same day of the visit.   All questions answered and patient verbalized understanding of the plan.  [place] Avina Eberle, MD [facility] Endocrinology [facility] [address], [address] [place], [place] [address] Phone # [phone]  At least part of this note was generated using voice recognition software. Inadvertent word errors may have occurred, which were not recognized during the proofreading process.

## 2023-12-04 ENCOUNTER — Other Ambulatory Visit: Payer: Self-pay

## 2023-12-04 ENCOUNTER — Encounter: Payer: Self-pay | Admitting: Endocrinology

## 2023-12-04 DIAGNOSIS — E1165 Type 2 diabetes mellitus with hyperglycemia: Secondary | ICD-10-CM

## 2023-12-04 MED ORDER — OZEMPIC (2 MG/DOSE) 8 MG/3ML ~~LOC~~ SOPN: PEN_INJECTOR | SUBCUTANEOUS | 3 refills | Status: DC

## 2023-12-05 ENCOUNTER — Encounter: Payer: Self-pay | Admitting: Endocrinology

## 2023-12-06 ENCOUNTER — Other Ambulatory Visit: Payer: Self-pay

## 2023-12-06 NOTE — Telephone Encounter (Signed)
 Prescription for Jatenzo  is already 90-day supply however as far as I know the insurance does not give more than 30 days at a time.  Thanks.

## 2023-12-20 ENCOUNTER — Other Ambulatory Visit: Payer: Self-pay

## 2024-01-06 ENCOUNTER — Telehealth: Payer: Self-pay

## 2024-01-06 ENCOUNTER — Encounter: Payer: Self-pay | Admitting: Endocrinology

## 2024-01-06 ENCOUNTER — Other Ambulatory Visit (HOSPITAL_COMMUNITY): Payer: Self-pay

## 2024-01-06 NOTE — Telephone Encounter (Signed)
 Clinical info has been submitted

## 2024-01-06 NOTE — Telephone Encounter (Signed)
 Pharmacy Patient Advocate Encounter   Received notification from Pt Calls Messages that prior authorization for Jatenzo  237mg  is required/requested.   Insurance verification completed.   The patient is insured through [facility] .   Per test claim: PA required; PA started via CoverMyMeds. KEY [id number] . Waiting for clinical questions to populate.

## 2024-01-06 NOTE — Telephone Encounter (Signed)
 Per patient pharmacy stated Jatenzo  will no longer be covered by his insurance, however patient requesting to remain on Jatenzo .

## 2024-01-27 NOTE — Telephone Encounter (Signed)
 Pharmacy Patient Advocate Encounter  Received notification from [facility] that Prior Authorization for Jatenzo  237mg  has been CANCELLED due to Your PA request has been closed. A request has been sent to the Senior Team for review. A follow up will be sent to your office via fax   PA #/Case ID/Reference #: [id number]  Fax has not been received yet. Will follow up when more information is available.

## 2024-02-05 ENCOUNTER — Other Ambulatory Visit: Payer: Self-pay | Admitting: Family Medicine

## 2024-02-06 ENCOUNTER — Encounter: Payer: Self-pay | Admitting: Endocrinology

## 2024-02-09 ENCOUNTER — Encounter: Payer: Self-pay | Admitting: Endocrinology

## 2024-02-10 ENCOUNTER — Encounter: Payer: Self-pay | Admitting: "Endocrinology

## 2024-02-10 ENCOUNTER — Other Ambulatory Visit: Payer: Self-pay

## 2024-02-10 MED ORDER — SYNJARDY XR 12.5-1000 MG PO TB24: 2.0000 | ORAL_TABLET | Freq: Every day | ORAL | 2 refills | Status: DC

## 2024-02-10 MED ORDER — TESTOSTERONE 50 MG/5GM (1%) TD GEL
5.0000 g | Freq: Every day | TRANSDERMAL | 0 refills | Status: DC
Start: 2024-02-10 — End: 2024-03-09

## 2024-02-10 NOTE — Progress Notes (Signed)
 Switched to androgel  on patient request as oral testosterone  is not covered.  - Start  Androgel /Testim  1% gel 50 mg/day (5g = 50mg  of testosterone )                         Wash the skin with soap and water before having skin-to-skin     contact.    Apply to clean, intact, dry skin of shoulders/upper arms/abdomen (not to genitals)   Cover the application site with a shirt   Testosterone  levels are maintained when the application site is    washed 4-6H after application  Side effects of testosterone  therapy including prostate cancer, increase Benign Prostatic Hyperplasia and increase in urinary obstruction and lower urinary tracts symptoms.   Testosterone  can worsen sleep apnea. Testosterone  can increase hematocrit/erythrocytosis and so can increase risk for blood clots. Some studies show that testosterone  can increase the cardiovascular risk. But TEEAM trial has shown that testosterone  does not increase progression of subclinical atherosclerosis.

## 2024-02-11 ENCOUNTER — Other Ambulatory Visit: Payer: Self-pay

## 2024-02-11 DIAGNOSIS — E1165 Type 2 diabetes mellitus with hyperglycemia: Secondary | ICD-10-CM

## 2024-02-11 MED ORDER — OZEMPIC (2 MG/DOSE) 8 MG/3ML ~~LOC~~ SOPN: 2.0000 mg | PEN_INJECTOR | SUBCUTANEOUS | 3 refills | Status: AC

## 2024-03-02 ENCOUNTER — Other Ambulatory Visit (HOSPITAL_COMMUNITY): Payer: Self-pay

## 2024-03-04 ENCOUNTER — Encounter: Payer: Self-pay | Admitting: Endocrinology

## 2024-03-09 ENCOUNTER — Ambulatory Visit: Payer: Self-pay | Admitting: Endocrinology

## 2024-03-09 ENCOUNTER — Ambulatory Visit (INDEPENDENT_AMBULATORY_CARE_PROVIDER_SITE_OTHER): Admitting: Endocrinology

## 2024-03-09 ENCOUNTER — Encounter: Payer: Self-pay | Admitting: Endocrinology

## 2024-03-09 VITALS — BP 110/70 | HR 96 | Resp 20 | Ht 70.0 in | Wt 259.4 lb

## 2024-03-09 DIAGNOSIS — E291 Testicular hypofunction: Secondary | ICD-10-CM

## 2024-03-09 DIAGNOSIS — E1165 Type 2 diabetes mellitus with hyperglycemia: Secondary | ICD-10-CM

## 2024-03-09 DIAGNOSIS — Z794 Long term (current) use of insulin: Secondary | ICD-10-CM

## 2024-03-09 LAB — POCT GLYCOSYLATED HEMOGLOBIN (HGB A1C): Hemoglobin A1C: 7.3 % — AB (ref 4.0–5.6)

## 2024-03-09 MED ORDER — TESTOSTERONE 50 MG/5GM (1%) TD GEL: 10.0000 g | Freq: Every day | TRANSDERMAL | 0 refills | Status: DC

## 2024-03-09 NOTE — Progress Notes (Signed)
**Note Clifford-Identified via Obfuscation**  Outpatient Endocrinology Note [place] Yavier Snider, MD  03/09/24  Patient's Name: Clifford Kramer    DOB: 1966/02/19    MRN: [id number]                                                    REASON OF VISIT: Follow-up of type 2 diabetes mellitus  PCP: Mercer Clotilda SAUNDERS, MD  HISTORY OF PRESENT ILLNESS:   Clifford Kramer is a 58 y.o. old male with past medical history listed below, is here for follow up of type 2 diabetes mellitus.   Pertinent Diabetes History: Patient was diagnosed with type 2 diabetes mellitus in 2005.  Chronic Diabetes Complications : Retinopathy: no. Last ophthalmology exam was done on annually, at [place]. Nephropathy: CKD IIIa, on losartan  Peripheral neuropathy: yes, follow with podiatry.  Coronary artery disease: no Stroke: no  Relevant comorbidities and cardiovascular risk factors: Obesity: yes Body mass index is 37.22 kg/m.  Hypertension: yes Hyperlipidemia. Yes, on statin.   Current / Home Diabetic regimen includes: Ozempic  2 mg weekly. Synjardy  12.5-1000mg  2 tab daily.  Amaryl   2 mg daily with breakfast.   Prior diabetic medications: Glipizide  stopped due to ? hypoglycemia.  Glycemic data: Accu-Chek glucometer: This morning blood sugar 93. Usually around 130s.  Forgot to bring glucometer in the clinic today.  Hypoglycemia: Patient has no hypoglycemic episodes. Patient has hypoglycemia awareness.  Factors modifying glucose control: 1.  Diabetic diet assessment: 3 meals a day.  2.  Staying active or exercising: ?  No formal exercise.  Start working at [employer] AM for [employer].  3.  Medication compliance: compliant all of the time.  # HYPOGONADISM:  He has been on testosterone  supplements since 2011 when his level was mildly low around 300  but no further evaluation done. He has a relatively low testosterone  level while using Axiron  and had difficulty with the application. With Androderm  patches he had improved testosterone  levels and less fatigue. His testosterone   level had been inconsistent with using 8 mg Androderm  and was taking the 6 mg total dose. He was getting more skin irritation and sensitivity to the Androderm , also had nasal irritation/bad taste with Natesto .  In early 2024 switched to taking Jatenzo  237 mg twice a day with food.  In June / July 2025, Jatenzo  was not covered by insurance and changed to testosterone  gel 1% 50 mg/ 5g 1 tube daily.  Interval history  Hemoglobin A1c slightly improved to 7.3%.  Diabetes regimen is reviewed and noted above.  In between the visits, the trends of was not covered and is currently doing testosterone  gel 1 tube daily.  He has felt low energy after changing to gel.  No glucose data to review.  No other complaints today.  REVIEW OF SYSTEMS As per history of present illness.   PAST MEDICAL HISTORY: Past Medical History:  Diagnosis Date   Anemia    COVID-19 11/2020   Diabetes mellitus    Episcleritis of right eye 06/2016   GERD (gastroesophageal reflux disease)    Gout    Hyperlipidemia    Hypertension    RA (rheumatoid arthritis) (HCC)    Sleep apnea    CPAP use     PAST SURGICAL HISTORY: Past Surgical History:  Procedure Laterality Date   COLONOSCOPY  2015   INSERTION OF MESH N/A 10/20/2021  Procedure: INSERTION OF MESH;  Surgeon: Paola Dreama SAILOR, MD;  Location: [facility] OR;  Service: General;  Laterality: N/A;   KNEE SURGERY Left 2005   VENTRAL HERNIA REPAIR N/A 10/20/2021   Procedure: LAPAROSCOPIC VENTRAL HERNIA REPAIR;  Surgeon: Paola Dreama SAILOR, MD;  Location: [facility] OR;  Service: General;  Laterality: N/A;    ALLERGIES: Allergies  Allergen Reactions   Celebrex [Celecoxib] Itching   Penicillins Itching   Sulfonamide Derivatives Itching    FAMILY HISTORY:  Family History  Problem Relation Age of Onset   Diabetes Mother    Diabetes Maternal Aunt    Diabetes Maternal Grandmother    Stomach cancer Other    Heart disease Neg Hx    Colon cancer Neg Hx    Esophageal cancer Neg Hx     Rectal cancer Neg Hx    Colon polyps Neg Hx     SOCIAL HISTORY: Social History   Socioeconomic History   Marital status: Married    Spouse name: Not on file   Number of children: Not on file   Years of education: Not on file   Highest education level: Bachelor's degree (e.g., BA, AB, BS)  Occupational History   Not on file  Tobacco Use   Smoking status: Never   Smokeless tobacco: Never  Vaping Use   Vaping status: Never Used  Substance and Sexual Activity   Alcohol use: Yes    Comment: rare beer- 3 beers per year   Drug use: Not Currently   Sexual activity: Yes  Other Topics Concern   Not on file  Social History Narrative   Not on file   Social Drivers of Health   Financial Resource Strain: Low Risk  (08/27/2023)   Overall Financial Resource Strain (CARDIA)    Difficulty of Paying Living Expenses: Not hard at all  Food Insecurity: No Food Insecurity (08/27/2023)   Hunger Vital Sign    Worried About Running Out of Food in the Last Year: Never true    Ran Out of Food in the Last Year: Never true  Transportation Needs: No Transportation Needs (08/27/2023)   PRAPARE - Administrator, Civil Service (Medical): No    Lack of Transportation (Non-Medical): No  Physical Activity: Sufficiently Active (08/27/2023)   Exercise Vital Sign    Days of Exercise per Week: 5 days    Minutes of Exercise per Session: 80 min  Stress: No Stress Concern Present (08/27/2023)   [employer] of Occupational Health - Occupational Stress Questionnaire    Feeling of Stress : Only a little  Social Connections: Moderately Integrated (08/27/2023)   Social Connection and Isolation Panel    Frequency of Communication with Friends and Family: Once a week    Frequency of Social Gatherings with Friends and Family: Once a week    Attends Religious Services: More than 4 times per year    Active Member of [employer] or Organizations: Yes    Attends Engineer, structural: More than 4 times  per year    Marital Status: Married    MEDICATIONS:  Current Outpatient Medications  Medication Sig Dispense Refill   allopurinol (ZYLOPRIM) 300 MG tablet TAKE 1 TABLET BY MOUTH EVERY DAY 90 tablet 1   amLODipine (NORVASC) 2.5 MG tablet Take 1 tablet (2.5 mg total) by mouth daily. 90 tablet 3   BD PEN NEEDLE NANO U/F 32G X 4 MM MISC USE EVERY DAY 100 each 1   Bismuth/Metronidaz/Tetracyclin (PYLERA) 140-125-125 MG CAPS Take  3 capsules by mouth 4 (four) times daily - after meals and at bedtime. 120 capsule 0   Blood Glucose Monitoring Suppl (ACCU-CHEK GUIDE) w/Device KIT 1 each by Does not apply route 3 (three) times daily. Use accu chek guide device to check blood sugar twice daily. DX:E11.9 1 kit 0   Empagliflozin -metFORMIN  HCl ER (SYNJARDY  XR) 12.11-998 MG TB24 Take 2 tablets by mouth daily with breakfast. 180 tablet 2   ENBREL SURECLICK 50 MG/ML injection Inject 50 mg into the skin every Monday.     fluticasone  (FLONASE ) 50 MCG/ACT nasal spray PLACE 2 SPRAYS INTO THE NOSE DAILY. 48 mL 1   glimepiride  (AMARYL ) 2 MG tablet TAKE 1 TABLET BY MOUTH EVERY DAY WITH BREAKFAST 90 tablet 3   glucose blood (ACCU-CHEK GUIDE TEST) test strip Use to test blood glucose before meals and at bedtime 300 each 12   glucose blood test strip Use as instructed 100 each 12   hydroxychloroquine (PLAQUENIL) 200 MG tablet Take 200 mg by mouth 2 (two) times daily.     loratadine  (CLARITIN ) 10 MG tablet Take 1 tablet (10 mg total) by mouth daily. 90 tablet 0   losartan -hydrochlorothiazide  (HYZAAR) 50-12.5 MG tablet TAKE 1 TABLET BY MOUTH EVERY DAY 90 tablet 3   omeprazole  (PRILOSEC) 20 MG capsule Take 1 capsule (20 mg total) by mouth 2 (two) times daily before a meal. 20 capsule 0   omeprazole  (PRILOSEC) 40 MG capsule TAKE 1 CAPSULE (40 MG TOTAL) BY MOUTH DAILY. 90 capsule 1   OneTouch Delica Lancets 33G MISC Check blood sugar 2 times daily 100 each 12   Semaglutide , 2 MG/DOSE, (OZEMPIC , 2 MG/DOSE,) 8 MG/3ML SOPN  Inject 2 mg into the skin once a week. Inject 2 mg weekly for diabetes 9 mL 3   simvastatin  (ZOCOR ) 20 MG tablet Take 1 tablet (20 mg total) by mouth at bedtime. 90 tablet 3   vardenafil  (LEVITRA ) 20 MG tablet Take by mouth.     VIAGRA 100 MG tablet Take 100 mg by mouth daily as needed for erectile dysfunction.     testosterone  (ANDROGEL ) 50 MG/5GM (1%) GEL Place 10 g onto the skin daily. 900 g 0   No current facility-administered medications for this visit.    PHYSICAL EXAM: Vitals:   03/09/24 1604  BP: 110/70  Pulse: 96  Resp: 20  SpO2: 96%  Weight: 259 lb 6.4 oz (117.7 kg)  Height: 5' 10 (1.778 m)     Body mass index is 37.22 kg/m.  Wt Readings from Last 3 Encounters:  03/09/24 259 lb 6.4 oz (117.7 kg)  11/19/23 263 lb 3.2 oz (119.4 kg)  08/28/23 263 lb (119.3 kg)    General: Well developed, well nourished male in no apparent distress.  HEENT: AT/[place], no external lesions.  Eyes: Conjunctiva clear and no icterus. Neck: Neck supple  Lungs: Respirations not labored Neurologic: Alert, oriented, normal speech Extremities / Skin: Dry. Psychiatric: Does not appear depressed or anxious  Diabetic Foot Exam - Simple   No data filed     LABS Reviewed Lab Results  Component Value Date   HGBA1C 7.3 (A) 03/09/2024   HGBA1C 7.6 (H) 11/14/2023   HGBA1C 7.4 (A) 08/20/2023   Lab Results  Component Value Date   FRUCTOSAMINE 299 (H) 08/13/2023   FRUCTOSAMINE 254 01/09/2016   FRUCTOSAMINE 264 08/24/2013   Lab Results  Component Value Date   CHOL 141 06/24/2023   HDL 32.00 (L) 06/24/2023   LDLCALC 96 06/24/2023  TRIG 68.0 06/24/2023   CHOLHDL 4 06/24/2023   Lab Results  Component Value Date   MICRALBCREAT 0.3 04/10/2010   MICRALBCREAT 1.7 04/11/2009   Lab Results  Component Value Date   CREATININE 1.38 (H) 11/14/2023   Lab Results  Component Value Date   GFR 57.56 (L) 05/13/2023    ASSESSMENT / PLAN  1. Uncontrolled type 2 diabetes mellitus with  hyperglycemia, with long-term current use of insulin  (HCC)   2. Male hypogonadism     Diabetes Mellitus type 2, complicated by diabetic neuropathy/CKD. - Diabetic status / severity: Uncontrolled.  Improving  Lab Results  Component Value Date   HGBA1C 7.3 (A) 03/09/2024    - Hemoglobin A1c goal : <6.5%  - Medications: See below, no change today.  Encouraged on limiting carbohydrates with meal.  Ozempic  2 mg weekly. Synjardy  12.5-1000mg  2 tab daily.  Amaryl  2 mg daily with breakfast.  - Home glucose testing: In the morning fasting and bedtime at least.  Advised to check blood sugar more often.  Asked to bring glucometer in the follow-up visit. - Discussed/ Gave Hypoglycemia treatment plan.  # Consult : not required at this time.   # Annual urine for microalbuminuria/ creatinine ratio, no microalbuminuria currently, continue ACE/ARB /losartan  /SGLT2 inhibitor.  Will check in the follow-up visit. Last  Lab Results  Component Value Date   MICRALBCREAT 0.3 04/10/2010    # Foot check nightly / neuropathy.  # Annual dilated diabetic eye exams.   - Diet: Make healthy diabetic food choices - Life style / activity / exercise: Discussed.  2. Blood pressure  -  BP Readings from Last 1 Encounters:  03/09/24 110/70    - Control is in target.  - No change in current plans.  3. Lipid status / Hyperlipidemia - Last  Lab Results  Component Value Date   LDLCALC 96 06/24/2023   - Continue simvastatin  20 mg daily.  # Hypogonadism: -He has been on testosterone  supplement since 2011.  Jatenzo  was stopped and has been on testosterone  gel from the middle of July.  He is currently taking testosterone  1% 50 mg / 5 g 1 q. daily.  Previously he was taking Jatenzo  237 mg 2 times a day. -Increase testosterone  1% gel from 5 g to 10 g, 1 tube to 2 tubes daily. -Check total and free testosterone  and follow-up visit in 3 months. . Diagnoses and all orders for this visit:  Uncontrolled type 2  diabetes mellitus with hyperglycemia, with long-term current use of insulin  (HCC) -     POCT glycosylated hemoglobin (Hb A1C) -     Lipid panel -     Hemoglobin A1c -     Basic metabolic panel with GFR -     Microalbumin / creatinine urine ratio  Male hypogonadism -     testosterone  (ANDROGEL ) 50 MG/5GM (1%) GEL; Place 10 g onto the skin daily. -     Testosterone  , Free and Total    DISPOSITION Follow up in clinic in 3  months suggested.  Labs prior to follow-up visit as ordered.  t.   All questions answered and patient verbalized understanding of the plan.  [place] Abran Gavigan, MD [facility] Endocrinology [facility] [address] [place], [address] [place], [place] [address] Phone # [phone]  At least part of this note was generated using voice recognition software. Inadvertent word errors may have occurred, which were not recognized during the proofreading process.

## 2024-06-15 ENCOUNTER — Other Ambulatory Visit

## 2024-06-16 LAB — LIPID PANEL
Cholesterol: 111 mg/dL (ref <200)
HDL: 42 mg/dL (ref >=40)
LDL Cholesterol (Calc): 52 mg/dL
Non-HDL Cholesterol (Calc): 69 mg/dL (ref <130)
Total CHOL/HDL Ratio: 2.6 (calc) (ref <5.0)
Triglycerides: 83 mg/dL (ref <150)

## 2024-06-16 LAB — BASIC METABOLIC PANEL WITH GFR
BUN: 16 mg/dL (ref 7–25)
CO2: 26 mmol/L (ref 20–32)
Calcium: 9.5 mg/dL (ref 8.6–10.3)
Chloride: 103 mmol/L (ref 98–110)
Creat: 1.34 mg/dL (ref 0.70–1.35)
Glucose, Bld: 91 mg/dL (ref 65–139)
Potassium: 3.9 mmol/L (ref 3.5–5.3)
Sodium: 140 mmol/L (ref 135–146)
eGFR: 61 mL/min/1.73m2 (ref >=60)

## 2024-06-16 LAB — HEMOGLOBIN A1C
Hgb A1c MFr Bld: 6.8 % — ABNORMAL HIGH (ref <5.7)
Mean Plasma Glucose: 148 mg/dL
eAG (mmol/L): 8.2 mmol/L

## 2024-06-16 LAB — MICROALBUMIN / CREATININE URINE RATIO
Creatinine, Urine: 126 mg/dL (ref 20–320)
Microalb Creat Ratio: 2 mg/g{creat} (ref <30)
Microalb, Ur: 0.3 mg/dL

## 2024-06-19 LAB — TESTOSTERONE, FREE & TOTAL
Free Testosterone: 56.9 pg/mL (ref 35.0–155.0)
Testosterone, Total, LC-MS-MS: 319 ng/dL (ref 250–1100)

## 2024-06-22 ENCOUNTER — Ambulatory Visit (INDEPENDENT_AMBULATORY_CARE_PROVIDER_SITE_OTHER): Admitting: Endocrinology

## 2024-06-22 ENCOUNTER — Encounter: Payer: Self-pay | Admitting: Endocrinology

## 2024-06-22 VITALS — BP 104/70 | HR 75 | Resp 16 | Ht 70.0 in | Wt 258.4 lb

## 2024-06-22 DIAGNOSIS — E1165 Type 2 diabetes mellitus with hyperglycemia: Secondary | ICD-10-CM

## 2024-06-22 DIAGNOSIS — Z125 Encounter for screening for malignant neoplasm of prostate: Secondary | ICD-10-CM | POA: Diagnosis not present

## 2024-06-22 DIAGNOSIS — Z5181 Encounter for therapeutic drug level monitoring: Secondary | ICD-10-CM

## 2024-06-22 DIAGNOSIS — Z794 Long term (current) use of insulin: Secondary | ICD-10-CM

## 2024-06-22 DIAGNOSIS — E291 Testicular hypofunction: Secondary | ICD-10-CM | POA: Diagnosis not present

## 2024-06-22 MED ORDER — GLIMEPIRIDE 2 MG PO TABS: ORAL_TABLET | ORAL | 3 refills | Status: DC

## 2024-06-22 MED ORDER — SYNJARDY XR 12.5-1000 MG PO TB24: 2.0000 | ORAL_TABLET | Freq: Every day | ORAL | 3 refills | Status: AC

## 2024-06-22 MED ORDER — TESTOSTERONE 50 MG/5GM (1%) TD GEL: 10.0000 g | Freq: Every day | TRANSDERMAL | 1 refills | Status: AC

## 2024-06-22 NOTE — Progress Notes (Signed)
 Outpatient Endocrinology Note Clifford Streiff, MD  06/22/24  Patient's Name: Clifford Kramer    DOB: March 28, 1966    MRN: [id number]                                                    REASON OF VISIT: Follow-up of type 2 diabetes mellitus  PCP: Mercer Clotilda SAUNDERS, MD  HISTORY OF PRESENT ILLNESS:   Clifford Kramer is a 58 y.o. old male with past medical history listed below, is here for follow up of type 2 diabetes mellitus.   Pertinent Diabetes History: Patient was diagnosed with type 2 diabetes mellitus in 2005.  Chronic Diabetes Complications : Retinopathy: no. Last ophthalmology exam was done on annually, at [place]. Nephropathy: CKD IIIa, on losartan  Peripheral neuropathy: yes, follow with podiatry.  Coronary artery disease: no Stroke: no  Relevant comorbidities and cardiovascular risk factors: Obesity: yes Body mass index is 37.08 kg/m.  Hypertension: yes Hyperlipidemia. Yes, on statin.   Current / Home Diabetic regimen includes: Ozempic  2 mg weekly. Synjardy  12.5-1000mg  2 tab daily.  Amaryl   2 mg daily with breakfast.   Prior diabetic medications: Glipizide  stopped due to ? hypoglycemia.  Glycemic data: Accu-Chek glucometer: No glucose data to review today.  Hypoglycemia: Patient has no hypoglycemic episodes. Patient has hypoglycemia awareness.  Factors modifying glucose control: 1.  Diabetic diet assessment: 3 meals a day.  2.  Staying active or exercising: ?  No formal exercise.  Start working at [employer] for [employer].  3.  Medication compliance: compliant all of the time.  # HYPOGONADISM:  He has been on testosterone  supplements since 2011 when his level was mildly low around 300  but no further evaluation done. He has a relatively low testosterone  level while using Axiron  and had difficulty with the application. With Androderm  patches he had improved testosterone  levels and less fatigue. His testosterone  level had been inconsistent with using 8 mg Androderm  and was taking  the 6 mg total dose. He was getting more skin irritation and sensitivity to the Androderm , also had nasal irritation/bad taste with Natesto .  In early 2024 switched to taking Jatenzo  237 mg twice a day with food.  In June / July 2025, Jatenzo  was not covered by insurance and changed to testosterone  gel 1% 50 mg/ 5g 1 tube daily, increase to 10 g/2 tubes daily in August 2025.  Interval history  Hemoglobin A1c improved to 6.8%.  Diabetes regimen as reviewed and noted above.  Recent laboratory results reviewed, normal electrolytes and acceptable renal function.  Acceptable lipids level.  Normal urine microalbumin creatinine ratio.  He has been taking testosterone  gel 2 to use daily, testosterone  level acceptable on recent lab.  No other complaints today.  REVIEW OF SYSTEMS As per history of present illness.   PAST MEDICAL HISTORY: Past Medical History:  Diagnosis Date   Anemia    COVID-19 11/2020   Diabetes mellitus    Episcleritis of right eye 06/2016   GERD (gastroesophageal reflux disease)    Gout    Hyperlipidemia    Hypertension    RA (rheumatoid arthritis) (HCC)    Sleep apnea    CPAP use     PAST SURGICAL HISTORY: Past Surgical History:  Procedure Laterality Date   COLONOSCOPY  2015   INSERTION OF MESH N/A 10/20/2021   Procedure:  INSERTION OF MESH;  Surgeon: Paola Dreama SAILOR, MD;  Location: [facility] OR;  Service: General;  Laterality: N/A;   KNEE SURGERY Left 2005   VENTRAL HERNIA REPAIR N/A 10/20/2021   Procedure: LAPAROSCOPIC VENTRAL HERNIA REPAIR;  Surgeon: Paola Dreama SAILOR, MD;  Location: [facility] OR;  Service: General;  Laterality: N/A;    ALLERGIES: Allergies  Allergen Reactions   Celebrex [Celecoxib] Itching   Penicillins Itching   Sulfonamide Derivatives Itching    FAMILY HISTORY:  Family History  Problem Relation Age of Onset   Diabetes Mother    Diabetes Maternal Aunt    Diabetes Maternal Grandmother    Stomach cancer Other    Heart disease Neg Hx    Colon cancer  Neg Hx    Esophageal cancer Neg Hx    Rectal cancer Neg Hx    Colon polyps Neg Hx     SOCIAL HISTORY: Social History   Socioeconomic History   Marital status: Married    Spouse name: Not on file   Number of children: Not on file   Years of education: Not on file   Highest education level: Bachelor's degree (e.g., BA, AB, BS)  Occupational History   Not on file  Tobacco Use   Smoking status: Never   Smokeless tobacco: Never  Vaping Use   Vaping status: Never Used  Substance and Sexual Activity   Alcohol use: Yes    Comment: rare beer- 3 beers per year   Drug use: Not Currently   Sexual activity: Yes  Other Topics Concern   Not on file  Social History Narrative   Not on file   Social Drivers of Health   Financial Resource Strain: Low Risk  (08/27/2023)   Overall Financial Resource Strain (CARDIA)    Difficulty of Paying Living Expenses: Not hard at all  Food Insecurity: No Food Insecurity (08/27/2023)   Hunger Vital Sign    Worried About Running Out of Food in the Last Year: Never true    Ran Out of Food in the Last Year: Never true  Transportation Needs: No Transportation Needs (08/27/2023)   PRAPARE - Administrator, Civil Service (Medical): No    Lack of Transportation (Non-Medical): No  Physical Activity: Sufficiently Active (08/27/2023)   Exercise Vital Sign    Days of Exercise per Week: 5 days    Minutes of Exercise per Session: 80 min  Stress: No Stress Concern Present (08/27/2023)   [employer] of Occupational Health - Occupational Stress Questionnaire    Feeling of Stress : Only a little  Social Connections: Moderately Integrated (08/27/2023)   Social Connection and Isolation Panel    Frequency of Communication with Friends and Family: Once a week    Frequency of Social Gatherings with Friends and Family: Once a week    Attends Religious Services: More than 4 times per year    Active Member of [employer] or Organizations: Yes    Attends Museum/gallery Exhibitions Officer: More than 4 times per year    Marital Status: Married    MEDICATIONS:  Current Outpatient Medications  Medication Sig Dispense Refill   allopurinol  (ZYLOPRIM ) 300 MG tablet TAKE 1 TABLET BY MOUTH EVERY DAY 90 tablet 1   amLODipine  (NORVASC ) 2.5 MG tablet Take 1 tablet (2.5 mg total) by mouth daily. 90 tablet 3   BD PEN NEEDLE NANO U/F 32G X 4 MM MISC USE EVERY DAY 100 each 1   Bismuth /Metronidaz/Tetracyclin (PYLERA) 140-125-125 MG CAPS Take 3  capsules by mouth 4 (four) times daily - after meals and at bedtime. 120 capsule 0   Blood Glucose Monitoring Suppl (ACCU-CHEK GUIDE) w/Device KIT 1 each by Does not apply route 3 (three) times daily. Use accu chek guide device to check blood sugar twice daily. DX:E11.9 1 kit 0   ENBREL SURECLICK 50 MG/ML injection Inject 50 mg into the skin every Monday.     fluticasone  (FLONASE ) 50 MCG/ACT nasal spray PLACE 2 SPRAYS INTO THE NOSE DAILY. 48 mL 1   glucose blood (ACCU-CHEK GUIDE TEST) test strip Use to test blood glucose before meals and at bedtime 300 each 12   glucose blood test strip Use as instructed 100 each 12   hydroxychloroquine (PLAQUENIL) 200 MG tablet Take 200 mg by mouth 2 (two) times daily.     loratadine  (CLARITIN ) 10 MG tablet Take 1 tablet (10 mg total) by mouth daily. 90 tablet 0   losartan -hydrochlorothiazide  (HYZAAR) 50-12.5 MG tablet TAKE 1 TABLET BY MOUTH EVERY DAY 90 tablet 3   omeprazole  (PRILOSEC) 20 MG capsule Take 1 capsule (20 mg total) by mouth 2 (two) times daily before a meal. 20 capsule 0   omeprazole  (PRILOSEC) 40 MG capsule TAKE 1 CAPSULE (40 MG TOTAL) BY MOUTH DAILY. 90 capsule 1   OneTouch Delica Lancets 33G MISC Check blood sugar 2 times daily 100 each 12   Semaglutide , 2 MG/DOSE, (OZEMPIC , 2 MG/DOSE,) 8 MG/3ML SOPN Inject 2 mg into the skin once a week. Inject 2 mg weekly for diabetes 9 mL 3   simvastatin  (ZOCOR ) 20 MG tablet Take 1 tablet (20 mg total) by mouth at bedtime. 90 tablet 3    vardenafil  (LEVITRA ) 20 MG tablet Take by mouth.     VIAGRA 100 MG tablet Take 100 mg by mouth daily as needed for erectile dysfunction.     Empagliflozin -metFORMIN  HCl ER (SYNJARDY  XR) 12.11-998 MG TB24 Take 2 tablets by mouth daily with breakfast. 180 tablet 3   glimepiride  (AMARYL ) 2 MG tablet TAKE 1 TABLET BY MOUTH EVERY DAY WITH BREAKFAST 90 tablet 3   testosterone  (ANDROGEL ) 50 MG/5GM (1%) GEL Place 10 g onto the skin daily. 900 g 1   No current facility-administered medications for this visit.    PHYSICAL EXAM: Vitals:   06/22/24 1007  BP: 104/70  Pulse: 75  Resp: 16  SpO2: 96%  Weight: 258 lb 6.4 oz (117.2 kg)  Height: 5' 10 (1.778 m)     Body mass index is 37.08 kg/m.  Wt Readings from Last 3 Encounters:  06/22/24 258 lb 6.4 oz (117.2 kg)  03/09/24 259 lb 6.4 oz (117.7 kg)  11/19/23 263 lb 3.2 oz (119.4 kg)    General: Well developed, well nourished male in no apparent distress.  HEENT: AT/[place], no external lesions.  Eyes: Conjunctiva clear and no icterus. Neck: Neck supple  Lungs: Respirations not labored Neurologic: Alert, oriented, normal speech Extremities / Skin: Dry. Psychiatric: Does not appear depressed or anxious  Diabetic Foot Exam - Simple   No data filed    LABS Reviewed Lab Results  Component Value Date   HGBA1C 6.8 (H) 06/15/2024   HGBA1C 7.3 (A) 03/09/2024   HGBA1C 7.6 (H) 11/14/2023   Lab Results  Component Value Date   FRUCTOSAMINE 299 (H) 08/13/2023   FRUCTOSAMINE 254 01/09/2016   FRUCTOSAMINE 264 08/24/2013   Lab Results  Component Value Date   CHOL 111 06/15/2024   HDL 42 06/15/2024   LDLCALC 52 06/15/2024   TRIG  83 06/15/2024   CHOLHDL 2.6 06/15/2024   Lab Results  Component Value Date   MICRALBCREAT 2 06/15/2024   MICRALBCREAT 0.3 04/10/2010   Lab Results  Component Value Date   CREATININE 1.34 06/15/2024   Lab Results  Component Value Date   GFR 57.56 (L) 05/13/2023    ASSESSMENT / PLAN  1. Uncontrolled type  2 diabetes mellitus with hyperglycemia, with long-term current use of insulin  (HCC)   2. Male hypogonadism   3. Encounter for medication monitoring   4. Screening for prostate cancer      Diabetes Mellitus type 2, complicated by diabetic neuropathy/CKD. - Diabetic status / severity: Fair control, improving  Lab Results  Component Value Date   HGBA1C 6.8 (H) 06/15/2024    - Hemoglobin A1c goal : <6.5%  - Medications: See below, no change today.  Encouraged on limiting carbohydrates with meal.  Ozempic  2 mg weekly. Synjardy  12.5-1000mg  2 tab daily.  Amaryl  2 mg daily with breakfast.  - Home glucose testing: In the morning fasting and bedtime at least.  Advised to check blood sugar more often.  Asked to bring glucometer in the follow-up visit. - Discussed/ Gave Hypoglycemia treatment plan.  # Consult : not required at this time.   # Annual urine for microalbuminuria/ creatinine ratio, no microalbuminuria currently, continue ACE/ARB /losartan  /SGLT2 inhibitor.  Last  Lab Results  Component Value Date   MICRALBCREAT 2 06/15/2024    # Foot check nightly / neuropathy.  # Annual dilated diabetic eye exams.   - Diet: Make healthy diabetic food choices - Life style / activity / exercise: Discussed.  2. Blood pressure  -  BP Readings from Last 1 Encounters:  06/22/24 104/70    - Control is in target.  - No change in current plans.  3. Lipid status / Hyperlipidemia - Last  Lab Results  Component Value Date   LDLCALC 52 06/15/2024   - Continue simvastatin  20 mg daily.  # Hypogonadism: -He has been on testosterone  supplement since 2011.  Jatenzo  was stopped and has been on testosterone  gel from the middle of July.  He is currently taking testosterone  1% 50 mg / 5 g 1 q. daily.  Previously he was taking Jatenzo  237 mg 2 times a day. - Continue testosterone  1% gel 10 g / 2 tubes daily. - Will check PSA, hematocrit and testosterone  prior to follow-up visit.  Diagnoses and  all orders for this visit:  Uncontrolled type 2 diabetes mellitus with hyperglycemia, with long-term current use of insulin  (HCC) -     Basic metabolic panel with GFR -     Hemoglobin A1c -     Empagliflozin -metFORMIN  HCl ER (SYNJARDY  XR) 12.11-998 MG TB24; Take 2 tablets by mouth daily with breakfast. -     glimepiride  (AMARYL ) 2 MG tablet; TAKE 1 TABLET BY MOUTH EVERY DAY WITH BREAKFAST  Male hypogonadism -     Testosterone , Total, LC/MS/MS -     testosterone  (ANDROGEL ) 50 MG/5GM (1%) GEL; Place 10 g onto the skin daily.  Encounter for medication monitoring -     Hematocrit  Screening for prostate cancer -     PSA   DISPOSITION Follow up in clinic in 4  months suggested.  Labs prior to follow-up visit as ordered.    All questions answered and patient verbalized understanding of the plan.  Euva Rundell, MD [facility] [address] [place], [address] [place], [place] [address] Phone # [phone]  At least part of this note was generated using voice recognition software. Inadvertent word errors may have occurred, which were not recognized during the proofreading process.

## 2024-07-31 ENCOUNTER — Other Ambulatory Visit: Payer: Self-pay | Admitting: Endocrinology

## 2024-07-31 ENCOUNTER — Other Ambulatory Visit: Payer: Self-pay | Admitting: Family Medicine

## 2024-07-31 DIAGNOSIS — E1165 Type 2 diabetes mellitus with hyperglycemia: Secondary | ICD-10-CM

## 2024-08-14 ENCOUNTER — Encounter: Payer: Self-pay | Admitting: Endocrinology

## 2024-08-14 ENCOUNTER — Ambulatory Visit: Admitting: Family Medicine

## 2024-08-14 ENCOUNTER — Encounter: Payer: Self-pay | Admitting: Family Medicine

## 2024-08-14 ENCOUNTER — Ambulatory Visit: Payer: Self-pay | Admitting: Family Medicine

## 2024-08-14 VITALS — BP 132/90 | HR 78 | Temp 97.8°F | Ht 70.0 in | Wt 265.0 lb

## 2024-08-14 DIAGNOSIS — Z0000 Encounter for general adult medical examination without abnormal findings: Secondary | ICD-10-CM

## 2024-08-14 DIAGNOSIS — R238 Other skin changes: Secondary | ICD-10-CM | POA: Diagnosis not present

## 2024-08-14 DIAGNOSIS — E782 Mixed hyperlipidemia: Secondary | ICD-10-CM | POA: Diagnosis not present

## 2024-08-14 DIAGNOSIS — L918 Other hypertrophic disorders of the skin: Secondary | ICD-10-CM

## 2024-08-14 DIAGNOSIS — E1169 Type 2 diabetes mellitus with other specified complication: Secondary | ICD-10-CM

## 2024-08-14 DIAGNOSIS — Z23 Encounter for immunization: Secondary | ICD-10-CM | POA: Diagnosis not present

## 2024-08-14 DIAGNOSIS — I10 Essential (primary) hypertension: Secondary | ICD-10-CM

## 2024-08-14 DIAGNOSIS — E559 Vitamin D deficiency, unspecified: Secondary | ICD-10-CM

## 2024-08-14 LAB — LIPID PANEL
Cholesterol: 101 mg/dL (ref 28–200)
HDL: 38.9 mg/dL — ABNORMAL LOW
LDL Cholesterol: 47 mg/dL (ref 10–99)
NonHDL: 62.5
Total CHOL/HDL Ratio: 3
Triglycerides: 77 mg/dL (ref 10.0–149.0)
VLDL: 15.4 mg/dL (ref 0.0–40.0)

## 2024-08-14 LAB — PSA: PSA: 0.41 ng/mL (ref 0.10–4.00)

## 2024-08-14 LAB — TSH: TSH: 2.64 u[IU]/mL (ref 0.35–5.50)

## 2024-08-14 LAB — VITAMIN D 25 HYDROXY (VIT D DEFICIENCY, FRACTURES): VITD: 13.82 ng/mL — ABNORMAL LOW (ref 30.00–100.00)

## 2024-08-14 LAB — VITAMIN B12: Vitamin B-12: 355 pg/mL (ref 211–911)

## 2024-08-14 MED ORDER — VITAMIN D (ERGOCALCIFEROL) 1.25 MG (50000 UNIT) PO CAPS: 50000.0000 [IU] | ORAL_CAPSULE | ORAL | 0 refills | Status: AC

## 2024-08-14 NOTE — Progress Notes (Signed)
 "  Established Patient Office Visit   Subjective  Patient ID: Clifford Kramer, male    DOB: 1965-10-06  Age: 59 y.o. MRN: [id number]  Chief Complaint  Patient presents with   Annual Exam    Had labs done at [facility] 08/03/2024, patient is fasting      Patient is a 59 year old male seen for CPE.  Patient states he is doing well.  Had labs on 08/03/2024 including CBC, CMP, uric acid level.  States blood sugars been well-controlled.  Last A1c 6.8% in 06/15/2024.  Patient with 2 papules on right cheek.  Pruritic during the summer.  Unclear if changing in size.    Patient Active Problem List   Diagnosis Date Noted   Iron  deficiency 08/08/2023   Iron  deficiency anemia 05/23/2023   History of colonic polyps 02/25/2023   Obesity 11/21/2020   Posttraumatic stress disorder 11/21/2020   Snoring 11/21/2020   Routine general medical examination at a health care facility 10/01/2017   Male hypogonadism 01/16/2016   Type II diabetes mellitus, uncontrolled 03/04/2013   Dyslipidemia (high LDL; low HDL) 03/04/2013   Alpha trait thalassemia 07/30/2012   Hyperlipidemia 07/30/2012   Gout 08/17/2010   Obstructive sleep apnea 12/07/2009   Hypersomnia with sleep 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   ERECTILE DYSFUNCTION 03/07/2007   Essential hypertension 03/07/2007   ALLERGIC RHINITIS 03/07/2007   Type 2 or unspecified type diabetes mellitus, uncontrolled 07/30/2006   Past Medical History:  Diagnosis Date   Allergy 2004   Anemia    COVID-19 11/2020   Diabetes mellitus    Episcleritis of right eye 06/2016   GERD (gastroesophageal reflux disease) 2000   Gout    Hyperlipidemia Don't remember   Hypertension 2000   Oxygen deficiency 2014   RA (rheumatoid arthritis) (HCC)    Sleep apnea 2013   CPAP use    Past Surgical History:  Procedure Laterality Date   COLONOSCOPY  2015   INSERTION OF MESH N/A 10/20/2021   Procedure: INSERTION OF MESH;  Surgeon: Paola Dreama SAILOR, MD;  Location: [facility] OR;  Service: General;   Laterality: N/A;   KNEE SURGERY Left 2005   VENTRAL HERNIA REPAIR N/A 10/20/2021   Procedure: LAPAROSCOPIC VENTRAL HERNIA REPAIR;  Surgeon: Paola Dreama SAILOR, MD;  Location: [facility] OR;  Service: General;  Laterality: N/A;   Social History[1] Family History  Problem Relation Age of Onset   Diabetes Mother    Diabetes Maternal Aunt    Diabetes Maternal Grandmother    Stomach cancer Other    Heart disease Neg Hx    Colon cancer Neg Hx    Esophageal cancer Neg Hx    Rectal cancer Neg Hx    Colon polyps Neg Hx    Allergies[2]  ROS Negative unless stated above    Objective:     BP (!) 132/90 (BP Location: Left Arm, Patient Position: Sitting, Cuff Size: Large)   Pulse 78   Temp 97.8 F (36.6 C) (Oral)   Ht 5' 10 (1.778 m)   Wt 265 lb (120.2 kg)   SpO2 99%   BMI 38.02 kg/m  BP Readings from Last 3 Encounters:  08/14/24 (!) 132/90  06/22/24 104/70  03/09/24 110/70   Wt Readings from Last 3 Encounters:  08/14/24 265 lb (120.2 kg)  06/22/24 258 lb 6.4 oz (117.2 kg)  03/09/24 259 lb 6.4 oz (117.7 kg)      Physical Exam Constitutional:      Appearance: Normal appearance.  HENT:  Head: Normocephalic and atraumatic.     Right Ear: Tympanic membrane, ear canal and external ear normal.     Left Ear: Tympanic membrane, ear canal and external ear normal.     Nose: Nose normal.     Mouth/Throat:     Mouth: Mucous membranes are moist.     Pharynx: No oropharyngeal exudate or posterior oropharyngeal erythema.  Eyes:     General: No scleral icterus.    Extraocular Movements: Extraocular movements intact.     Conjunctiva/sclera: Conjunctivae normal.     Pupils: Pupils are equal, round, and reactive to light.  Neck:     Thyroid : No thyromegaly.     Vascular: No carotid bruit.  Cardiovascular:     Rate and Rhythm: Normal rate and regular rhythm.     Pulses: Normal pulses.     Heart sounds: Normal heart sounds. No murmur heard.    No friction rub.  Pulmonary:     Effort:  Pulmonary effort is normal.     Breath sounds: Normal breath sounds. No wheezing, rhonchi or rales.  Abdominal:     General: Bowel sounds are normal.     Palpations: Abdomen is soft.     Tenderness: There is no abdominal tenderness.  Musculoskeletal:        General: No deformity. Normal range of motion.  Lymphadenopathy:     Cervical: No cervical adenopathy.  Skin:    General: Skin is warm and dry.     Findings: No lesion.     Comments: dry, flaky.  2 pearly, smooth papules on face and right upper cheek 4 and 5 mm in size.  Neurological:     General: No focal deficit present.     Mental Status: He is alert and oriented to person, place, and time.  Psychiatric:        Mood and Affect: Mood normal.        Thought Content: Thought content normal.        08/28/2023    9:04 AM 08/12/2023    1:30 PM 08/08/2023    1:04 PM  Depression screen PHQ 2/9  Decreased Interest 0 0 0  Down, Depressed, Hopeless 1 0 0  PHQ - 2 Score 1 0 0  Altered sleeping 0    Tired, decreased energy 1    Change in appetite 0    Feeling bad or failure about yourself  0    Trouble concentrating 1    Moving slowly or fidgety/restless 0    Suicidal thoughts 0    PHQ-9 Score 3        Data saved with a previous flowsheet row definition      08/28/2023    9:05 AM 05/22/2021    1:40 PM  GAD 7 : Generalized Anxiety Score  Nervous, Anxious, on Edge 0 0  Control/stop worrying 0 0  Worry too much - different things 0 0  Trouble relaxing 1 0  Restless 0 0  Easily annoyed or irritable 0 0  Afraid - awful might happen 0 0  Total GAD 7 Score 1 0  Anxiety Difficulty  Not difficult at all     No results found for any visits on 08/14/24.    Assessment & Plan:   Well adult exam -     Lipid panel; Future -     PSA; Future -     TSH; Future -     VITAMIN D  25 Hydroxy (Vit-D Deficiency, Fractures); Future  Type  2 diabetes mellitus with other specified complication, without long-term current use of insulin   (HCC) -     VITAMIN D  25 Hydroxy (Vit-D Deficiency, Fractures); Future -     Vitamin B12; Future  Mixed hyperlipidemia  Essential hypertension -     TSH; Future  Need for pneumococcal 20-valent conjugate vaccination -     Pneumococcal conjugate vaccine 20-valent  Papule of skin -     Ambulatory referral to Dermatology  Skin tag -     Ambulatory referral to Dermatology  Vitamin D  deficiency -     VITAMIN D  25 Hydroxy (Vit-D Deficiency, Fractures); Future -     Vitamin D  (Ergocalciferol ); Take 1 capsule (50,000 Units total) by mouth every 7 (seven) days.  Dispense: 12 capsule; Refill: 0   Age-appropriate health screenings discussed.  Labs from 08/03/24 including CBC, CMP, and uric acid levels were reviewed.  H&H elevated at 17.5/54.  Creatinine 1.3, platelets 215, WBC 5.6.  Orders for additional labs placed.  Last A1c 6.8% on 06/15/2024.  Continue current medications for diabetes including Synjardy  12.11-998 mg take 2 tablets in a.m., Amaryl  2 mg in a.m.  Eye exam scheduled per patient at eye care center on 66.  Colonoscopy up-to-date done 02/25/2023.  Foot exam due later this month.  Pneumonia vaccine given this visit.  Continue lifestyle modifications.  Referral to dermatology for papules on face and skin tag of right neck.  History of vitamin D  deficiency.  Update: Vitamin D  13.82.  Rx for ergocalciferol  50,000 IUs sent to pharmacy.  Return in about 6 months (around 02/11/2025).   Clotilda JONELLE Single, MD       [1]  Social History Tobacco Use   Smoking status: Never   Smokeless tobacco: Never  Vaping Use   Vaping status: Never Used  Substance Use Topics   Alcohol use: Yes    Comment: rare beer- 3 beers per year   Drug use: Not Currently  [2]  Allergies Allergen Reactions   Celebrex [Celecoxib] Itching   Penicillins Itching   Sulfonamide Derivatives Itching   "

## 2024-08-17 NOTE — Telephone Encounter (Signed)
 Mounjaro / Tirzapetide may be more effective to lose weight compared to Ozempic . As long as it is covered by your insurance, we can switch. Please check with you insurance, and let us  know. Thanks.

## 2024-08-29 ENCOUNTER — Other Ambulatory Visit: Payer: Self-pay | Admitting: Endocrinology

## 2024-08-29 DIAGNOSIS — I10 Essential (primary) hypertension: Secondary | ICD-10-CM

## 2024-10-15 ENCOUNTER — Other Ambulatory Visit

## 2024-10-20 ENCOUNTER — Ambulatory Visit: Admitting: Endocrinology

## 2025-05-26 ENCOUNTER — Ambulatory Visit: Admitting: Physician Assistant
